# Patient Record
Sex: Male | Born: 1950 | Race: White | Hispanic: No | Smoking: Never smoker
Health system: Southern US, Community
[De-identification: ages and names within clinical notes are randomized; demographics above are authoritative.]

## PROBLEM LIST (undated history)

## (undated) DIAGNOSIS — E785 Hyperlipidemia, unspecified: Secondary | ICD-10-CM

## (undated) DIAGNOSIS — R0600 Dyspnea, unspecified: Secondary | ICD-10-CM

## (undated) DIAGNOSIS — I4891 Unspecified atrial fibrillation: Secondary | ICD-10-CM

## (undated) DIAGNOSIS — I739 Peripheral vascular disease, unspecified: Secondary | ICD-10-CM

## (undated) DIAGNOSIS — I509 Heart failure, unspecified: Secondary | ICD-10-CM

## (undated) DIAGNOSIS — I10 Essential (primary) hypertension: Secondary | ICD-10-CM

## (undated) DIAGNOSIS — E119 Type 2 diabetes mellitus without complications: Secondary | ICD-10-CM

## (undated) DIAGNOSIS — C801 Malignant (primary) neoplasm, unspecified: Secondary | ICD-10-CM

## (undated) HISTORY — PX: COLON RESECTION: SHX5231

## (undated) HISTORY — PX: COLONOSCOPY: SHX174

---

## 2010-11-14 ENCOUNTER — Ambulatory Visit: Payer: Self-pay | Admitting: Gastroenterology

## 2010-11-29 ENCOUNTER — Ambulatory Visit: Payer: Self-pay | Admitting: Internal Medicine

## 2010-12-05 ENCOUNTER — Ambulatory Visit: Payer: Self-pay | Admitting: Surgery

## 2010-12-06 ENCOUNTER — Ambulatory Visit: Payer: Self-pay | Admitting: Internal Medicine

## 2010-12-11 ENCOUNTER — Ambulatory Visit: Payer: Self-pay | Admitting: Internal Medicine

## 2010-12-11 ENCOUNTER — Telehealth: Payer: Self-pay

## 2010-12-11 DIAGNOSIS — C20 Malignant neoplasm of rectum: Secondary | ICD-10-CM

## 2010-12-11 NOTE — Telephone Encounter (Signed)
Pt scheduled for [place] 12/21/10 [place] pt needs instructing meds reviewed

## 2010-12-12 NOTE — Telephone Encounter (Signed)
Pt has been instructed and meds reviewed.  He will call with any questions after receiving the instructions in the mail.

## 2010-12-20 ENCOUNTER — Ambulatory Visit: Payer: Self-pay | Admitting: Surgery

## 2010-12-21 ENCOUNTER — Ambulatory Visit: Payer: Self-pay

## 2010-12-21 ENCOUNTER — Encounter: Payer: Self-pay | Admitting: Gastroenterology

## 2010-12-25 LAB — PATHOLOGY REPORT

## 2010-12-27 ENCOUNTER — Ambulatory Visit: Payer: Self-pay | Admitting: Surgery

## 2010-12-30 ENCOUNTER — Ambulatory Visit: Payer: Self-pay | Admitting: Internal Medicine

## 2011-01-05 ENCOUNTER — Encounter: Payer: Self-pay | Admitting: Gastroenterology

## 2011-01-29 ENCOUNTER — Ambulatory Visit: Payer: Self-pay | Admitting: Internal Medicine

## 2011-03-01 ENCOUNTER — Ambulatory Visit: Payer: Self-pay | Admitting: Internal Medicine

## 2011-03-06 ENCOUNTER — Telehealth: Payer: Self-pay

## 2011-03-06 NOTE — Telephone Encounter (Signed)
Dr Christella Hartigan,  Dr Katrinka Blazing with [facility] would like you to call him regarding the Rectal EUS you did on the pt in August  [phone].

## 2011-03-07 NOTE — Telephone Encounter (Signed)
i called.  He was in surgery.  i left my pager number for him to call back

## 2011-03-07 NOTE — Telephone Encounter (Signed)
We spoke, i answered all his questions.

## 2011-03-16 ENCOUNTER — Ambulatory Visit: Payer: Self-pay | Admitting: Surgery

## 2011-03-26 ENCOUNTER — Inpatient Hospital Stay: Payer: Self-pay | Admitting: Surgery

## 2011-03-28 LAB — PATHOLOGY REPORT

## 2011-04-13 ENCOUNTER — Ambulatory Visit: Payer: Self-pay | Admitting: Internal Medicine

## 2011-04-14 LAB — CEA: CEA: 1.8 ng/mL

## 2011-05-01 ENCOUNTER — Ambulatory Visit: Payer: Self-pay | Admitting: Internal Medicine

## 2011-05-14 LAB — COMPREHENSIVE METABOLIC PANEL
Alkaline Phosphatase: 101 U/L (ref 50–136)
Anion Gap: 8 (ref 7–16)
BUN: 11 mg/dL (ref 7–18)
Bilirubin,Total: 0.4 mg/dL (ref 0.2–1.0)
Calcium, Total: 8.8 mg/dL (ref 8.5–10.1)
Chloride: 104 mmol/L (ref 98–107)
Co2: 26 mmol/L (ref 21–32)
Creatinine: 1.03 mg/dL (ref 0.60–1.30)
EGFR (Non-African Amer.): >60
Osmolality: 282 (ref 275–301)
SGOT(AST): 18 U/L (ref 15–37)
SGPT (ALT): 29 U/L
Sodium: 138 mmol/L (ref 136–145)
Total Protein: 6.9 g/dL (ref 6.4–8.2)

## 2011-05-14 LAB — CBC CANCER CENTER
Basophil #: 0 x10 3/mm (ref 0.0–0.1)
Basophil %: 0.5 %
Eosinophil #: 0.1 x10 3/mm (ref 0.0–0.7)
Eosinophil %: 2.8 %
HCT: 35.1 % — ABNORMAL LOW (ref 40.0–52.0)
HGB: 12.2 g/dL — ABNORMAL LOW (ref 13.0–18.0)
Lymphocyte #: 0.4 x10 3/mm — ABNORMAL LOW (ref 1.0–3.6)
Lymphocyte %: 23.3 %
MCH: 29.4 pg (ref 26.0–34.0)
MCHC: 34.7 g/dL (ref 32.0–36.0)
MCV: 85 fL (ref 80–100)
Monocyte #: 0.3 x10 3/mm (ref 0.0–0.7)
Monocyte %: 17.5 %
Neutrophil #: 1.1 x10 3/mm — ABNORMAL LOW (ref 1.4–6.5)
Neutrophil %: 55.9 %
Platelet: 110 x10 3/mm — ABNORMAL LOW (ref 150–440)
RBC: 4.14 10*6/uL — ABNORMAL LOW (ref 4.40–5.90)
RDW: 13.9 % (ref 11.5–14.5)
WBC: 1.9 x10 3/mm — CL (ref 3.8–10.6)

## 2011-05-21 LAB — CBC CANCER CENTER
Eosinophil #: 0.1 x10 3/mm (ref 0.0–0.7)
Eosinophil %: 1.7 %
HCT: 39.5 % — ABNORMAL LOW (ref 40.0–52.0)
HGB: 13.4 g/dL (ref 13.0–18.0)
Lymphocyte %: 12.5 %
MCV: 85 fL (ref 80–100)
Monocyte #: 0.8 x10 3/mm — ABNORMAL HIGH (ref 0.0–0.7)
Neutrophil %: 71.8 %
Platelet: 261 x10 3/mm (ref 150–440)
RBC: 4.65 10*6/uL (ref 4.40–5.90)
RDW: 14.8 % — ABNORMAL HIGH (ref 11.5–14.5)
WBC: 5.9 x10 3/mm (ref 3.8–10.6)

## 2011-05-28 LAB — CBC CANCER CENTER
Basophil #: 0 x10 3/mm (ref 0.0–0.1)
Lymphocyte #: 0.8 x10 3/mm — ABNORMAL LOW (ref 1.0–3.6)
Lymphocyte %: 9.1 %
MCH: 28.7 pg (ref 26.0–34.0)
MCV: 84 fL (ref 80–100)
Monocyte %: 6.5 %
Neutrophil #: 7.5 x10 3/mm — ABNORMAL HIGH (ref 1.4–6.5)
Neutrophil %: 83.6 %
Platelet: 196 x10 3/mm (ref 150–440)
RBC: 4.85 10*6/uL (ref 4.40–5.90)
RDW: 15.2 % — ABNORMAL HIGH (ref 11.5–14.5)
WBC: 9 x10 3/mm (ref 3.8–10.6)

## 2011-06-01 ENCOUNTER — Ambulatory Visit: Payer: Self-pay | Admitting: Internal Medicine

## 2011-06-04 LAB — CBC CANCER CENTER
Basophil %: 0.4 %
Eosinophil %: 1.2 %
HCT: 37.2 % — ABNORMAL LOW (ref 40.0–52.0)
Lymphocyte %: 9.6 %
MCV: 85 fL (ref 80–100)
Monocyte #: 0.5 x10 3/mm (ref 0.0–0.7)
Monocyte %: 10.3 %
Neutrophil %: 78.5 %
RBC: 4.39 10*6/uL — ABNORMAL LOW (ref 4.40–5.90)
RDW: 16.2 % — ABNORMAL HIGH (ref 11.5–14.5)
WBC: 5.2 x10 3/mm (ref 3.8–10.6)

## 2011-06-04 LAB — HEPATIC FUNCTION PANEL A (ARMC)
Albumin: 3.1 g/dL — ABNORMAL LOW (ref 3.4–5.0)
Alkaline Phosphatase: 96 U/L (ref 50–136)
Bilirubin,Total: 0.4 mg/dL (ref 0.2–1.0)
SGOT(AST): 20 U/L (ref 15–37)
SGPT (ALT): 30 U/L

## 2011-06-04 LAB — BASIC METABOLIC PANEL
Anion Gap: 10 (ref 7–16)
Calcium, Total: 8.6 mg/dL (ref 8.5–10.1)
Chloride: 102 mmol/L (ref 98–107)
Co2: 27 mmol/L (ref 21–32)
Creatinine: 1.05 mg/dL (ref 0.60–1.30)
EGFR (Non-African Amer.): >60
Glucose: 228 mg/dL — ABNORMAL HIGH (ref 65–99)
Osmolality: 284 (ref 275–301)
Potassium: 3.7 mmol/L (ref 3.5–5.1)
Sodium: 139 mmol/L (ref 136–145)

## 2011-06-11 LAB — CBC CANCER CENTER
Eosinophil #: 0.1 x10 3/mm (ref 0.0–0.7)
Eosinophil %: 1.6 %
HCT: 38.4 % — ABNORMAL LOW (ref 40.0–52.0)
Lymphocyte #: 0.8 x10 3/mm — ABNORMAL LOW (ref 1.0–3.6)
MCH: 28.6 pg (ref 26.0–34.0)
MCHC: 34.3 g/dL (ref 32.0–36.0)
Monocyte #: 0.4 x10 3/mm (ref 0.0–0.7)
Monocyte %: 9.3 %
Platelet: 170 x10 3/mm (ref 150–440)

## 2011-06-18 LAB — CBC CANCER CENTER
Basophil %: 0.5 %
Eosinophil #: 0 x10 3/mm (ref 0.0–0.7)
Eosinophil %: 1 %
HCT: 37.8 % — ABNORMAL LOW (ref 40.0–52.0)
HGB: 12.9 g/dL — ABNORMAL LOW (ref 13.0–18.0)
Lymphocyte #: 0.6 x10 3/mm — ABNORMAL LOW (ref 1.0–3.6)
MCH: 28.6 pg (ref 26.0–34.0)
MCV: 84 fL (ref 80–100)
Monocyte #: 0.5 x10 3/mm (ref 0.0–0.7)
Monocyte %: 13.1 %
Neutrophil #: 3 x10 3/mm (ref 1.4–6.5)
Neutrophil %: 71.5 %
RBC: 4.49 10*6/uL (ref 4.40–5.90)
WBC: 4.2 x10 3/mm (ref 3.8–10.6)

## 2011-06-18 LAB — BASIC METABOLIC PANEL
Calcium, Total: 8.7 mg/dL (ref 8.5–10.1)
Chloride: 104 mmol/L (ref 98–107)
Co2: 28 mmol/L (ref 21–32)
Creatinine: 0.9 mg/dL (ref 0.60–1.30)
EGFR (Non-African Amer.): >60
Glucose: 203 mg/dL — ABNORMAL HIGH (ref 65–99)
Osmolality: 285 (ref 275–301)
Potassium: 3.7 mmol/L (ref 3.5–5.1)

## 2011-06-18 LAB — HEPATIC FUNCTION PANEL A (ARMC)
Bilirubin,Total: 0.6 mg/dL (ref 0.2–1.0)
SGOT(AST): 20 U/L (ref 15–37)
Total Protein: 6.7 g/dL (ref 6.4–8.2)

## 2011-06-25 LAB — CBC CANCER CENTER
Eosinophil %: 0.8 %
HGB: 13.5 g/dL (ref 13.0–18.0)
Lymphocyte %: 14.7 %
MCH: 28.5 pg (ref 26.0–34.0)
Monocyte #: 0.5 x10 3/mm (ref 0.0–0.7)
Monocyte %: 13 %
Neutrophil %: 71.3 %
Platelet: 163 x10 3/mm (ref 150–440)
RBC: 4.74 10*6/uL (ref 4.40–5.90)
RDW: 17.3 % — ABNORMAL HIGH (ref 11.5–14.5)
WBC: 4.2 x10 3/mm (ref 3.8–10.6)

## 2011-06-29 ENCOUNTER — Ambulatory Visit: Payer: Self-pay | Admitting: Internal Medicine

## 2011-07-02 LAB — CBC CANCER CENTER
Basophil %: 0.4 %
Eosinophil #: 0 x10 3/mm (ref 0.0–0.7)
Eosinophil %: 0.9 %
HCT: 37.7 % — ABNORMAL LOW (ref 40.0–52.0)
HGB: 12.9 g/dL — ABNORMAL LOW (ref 13.0–18.0)
Lymphocyte %: 13.7 %
MCH: 29.2 pg (ref 26.0–34.0)
Monocyte #: 0.4 x10 3/mm (ref 0.0–0.7)
Monocyte %: 12.8 %
Neutrophil %: 72.2 %
Platelet: 100 x10 3/mm — ABNORMAL LOW (ref 150–440)
RBC: 4.43 10*6/uL (ref 4.40–5.90)
WBC: 3.5 x10 3/mm — ABNORMAL LOW (ref 3.8–10.6)

## 2011-07-02 LAB — HEPATIC FUNCTION PANEL A (ARMC)
Alkaline Phosphatase: 81 U/L (ref 50–136)
Bilirubin, Direct: 0.1 mg/dL (ref 0.00–0.20)
SGOT(AST): 18 U/L (ref 15–37)

## 2011-07-02 LAB — BASIC METABOLIC PANEL
Anion Gap: 11 (ref 7–16)
BUN: 13 mg/dL (ref 7–18)
Calcium, Total: 9.2 mg/dL (ref 8.5–10.1)
Co2: 27 mmol/L (ref 21–32)
Creatinine: 0.96 mg/dL (ref 0.60–1.30)
EGFR (African American): >60
EGFR (Non-African Amer.): >60
Sodium: 139 mmol/L (ref 136–145)

## 2011-07-09 LAB — CBC CANCER CENTER
Basophil #: 0 x10 3/mm (ref 0.0–0.1)
Eosinophil #: 0 x10 3/mm (ref 0.0–0.7)
Eosinophil %: 0.8 %
HCT: 38.8 % — ABNORMAL LOW (ref 40.0–52.0)
Lymphocyte #: 0.6 x10 3/mm — ABNORMAL LOW (ref 1.0–3.6)
Monocyte %: 11.3 %
Neutrophil #: 2.4 x10 3/mm (ref 1.4–6.5)
Neutrophil %: 70.4 %
Platelet: 144 x10 3/mm — ABNORMAL LOW (ref 150–440)
RDW: 18.5 % — ABNORMAL HIGH (ref 11.5–14.5)

## 2011-07-16 LAB — HEPATIC FUNCTION PANEL A (ARMC)
Albumin: 3.2 g/dL — ABNORMAL LOW (ref 3.4–5.0)
Alkaline Phosphatase: 108 U/L (ref 50–136)
Bilirubin, Direct: 0.1 mg/dL (ref 0.00–0.20)
Bilirubin,Total: 0.7 mg/dL (ref 0.2–1.0)
SGOT(AST): 33 U/L (ref 15–37)

## 2011-07-16 LAB — CBC CANCER CENTER
Basophil #: 0 x10 3/mm (ref 0.0–0.1)
Basophil %: 0.3 %
HCT: 37.8 % — ABNORMAL LOW (ref 40.0–52.0)
Lymphocyte #: 0.5 x10 3/mm — ABNORMAL LOW (ref 1.0–3.6)
Lymphocyte %: 14.7 %
MCH: 29.5 pg (ref 26.0–34.0)
MCV: 85 fL (ref 80–100)
Monocyte #: 0.6 x10 3/mm (ref 0.0–0.7)
Neutrophil #: 2.3 x10 3/mm (ref 1.4–6.5)
Platelet: 100 x10 3/mm — ABNORMAL LOW (ref 150–440)
RDW: 19.9 % — ABNORMAL HIGH (ref 11.5–14.5)

## 2011-07-16 LAB — BASIC METABOLIC PANEL
BUN: 12 mg/dL (ref 7–18)
Calcium, Total: 8.9 mg/dL (ref 8.5–10.1)
EGFR (African American): >60
EGFR (Non-African Amer.): >60
Glucose: 139 mg/dL — ABNORMAL HIGH (ref 65–99)
Osmolality: 281 (ref 275–301)
Potassium: 3.5 mmol/L (ref 3.5–5.1)
Sodium: 140 mmol/L (ref 136–145)

## 2011-07-23 LAB — CBC CANCER CENTER
Basophil %: 0.3 %
Eosinophil #: 0 x10 3/mm (ref 0.0–0.7)
HGB: 14.2 g/dL (ref 13.0–18.0)
MCH: 29.3 pg (ref 26.0–34.0)
MCHC: 34.3 g/dL (ref 32.0–36.0)
MCV: 86 fL (ref 80–100)
Monocyte #: 0.4 x10 3/mm (ref 0.0–0.7)
Monocyte %: 13.8 %
Neutrophil #: 1.9 x10 3/mm (ref 1.4–6.5)
Neutrophil %: 63.2 %
RBC: 4.83 10*6/uL (ref 4.40–5.90)
RDW: 19.8 % — ABNORMAL HIGH (ref 11.5–14.5)
WBC: 3 x10 3/mm — ABNORMAL LOW (ref 3.8–10.6)

## 2011-07-30 ENCOUNTER — Ambulatory Visit: Payer: Self-pay | Admitting: Internal Medicine

## 2011-07-30 LAB — CBC CANCER CENTER
Basophil #: 0 x10 3/mm (ref 0.0–0.1)
Basophil %: 0.6 %
Eosinophil #: 0 x10 3/mm (ref 0.0–0.7)
HCT: 36.4 % — ABNORMAL LOW (ref 40.0–52.0)
Lymphocyte #: 0.5 x10 3/mm — ABNORMAL LOW (ref 1.0–3.6)
Lymphocyte %: 15.8 %
MCH: 29.9 pg (ref 26.0–34.0)
MCHC: 33.8 g/dL (ref 32.0–36.0)
Monocyte %: 19.4 %
Platelet: 93 x10 3/mm — ABNORMAL LOW (ref 150–440)
WBC: 2.9 x10 3/mm — ABNORMAL LOW (ref 3.8–10.6)

## 2011-07-30 LAB — BASIC METABOLIC PANEL
Anion Gap: 10 (ref 7–16)
Calcium, Total: 8.7 mg/dL (ref 8.5–10.1)
Chloride: 108 mmol/L — ABNORMAL HIGH (ref 98–107)
Creatinine: 0.79 mg/dL (ref 0.60–1.30)
EGFR (Non-African Amer.): >60
Potassium: 3.6 mmol/L (ref 3.5–5.1)
Sodium: 144 mmol/L (ref 136–145)

## 2011-07-30 LAB — HEPATIC FUNCTION PANEL A (ARMC)
Alkaline Phosphatase: 110 U/L (ref 50–136)
Bilirubin, Direct: <0.1 mg/dL (ref 0.00–0.20)
SGOT(AST): 37 U/L (ref 15–37)

## 2011-08-06 LAB — CBC CANCER CENTER
Basophil %: 0.5 %
HGB: 13.3 g/dL (ref 13.0–18.0)
Lymphocyte %: 19.3 %
MCH: 30.4 pg (ref 26.0–34.0)
MCHC: 34.8 g/dL (ref 32.0–36.0)
Monocyte %: 11.5 %
Neutrophil #: 1.9 x10 3/mm (ref 1.4–6.5)
Neutrophil %: 68.1 %
Platelet: 129 x10 3/mm — ABNORMAL LOW (ref 150–440)
WBC: 2.9 x10 3/mm — ABNORMAL LOW (ref 3.8–10.6)

## 2011-08-13 LAB — BASIC METABOLIC PANEL
Calcium, Total: 8.7 mg/dL (ref 8.5–10.1)
Chloride: 102 mmol/L (ref 98–107)
Creatinine: 0.9 mg/dL (ref 0.60–1.30)
EGFR (African American): >60
EGFR (Non-African Amer.): >60
Glucose: 170 mg/dL — ABNORMAL HIGH (ref 65–99)
Osmolality: 282 (ref 275–301)
Sodium: 139 mmol/L (ref 136–145)

## 2011-08-13 LAB — HEPATIC FUNCTION PANEL A (ARMC)
Alkaline Phosphatase: 120 U/L (ref 50–136)
Bilirubin,Total: 0.8 mg/dL (ref 0.2–1.0)
SGOT(AST): 56 U/L — ABNORMAL HIGH (ref 15–37)
Total Protein: 7 g/dL (ref 6.4–8.2)

## 2011-08-13 LAB — CBC CANCER CENTER
Basophil %: 0.7 %
Eosinophil %: 1.6 %
HCT: 38.8 % — ABNORMAL LOW (ref 40.0–52.0)
HGB: 13.1 g/dL (ref 13.0–18.0)
Lymphocyte #: 0.4 x10 3/mm — ABNORMAL LOW (ref 1.0–3.6)
Lymphocyte %: 15.6 %
MCH: 29.9 pg (ref 26.0–34.0)
MCHC: 33.8 g/dL (ref 32.0–36.0)
MCV: 89 fL (ref 80–100)
Monocyte #: 0.7 x10 3/mm (ref 0.2–1.0)
Monocyte %: 26.2 %
Neutrophil #: 1.5 x10 3/mm (ref 1.4–6.5)
Neutrophil %: 55.9 %
RBC: 4.38 10*6/uL — ABNORMAL LOW (ref 4.40–5.90)
WBC: 2.8 x10 3/mm — ABNORMAL LOW (ref 3.8–10.6)

## 2011-08-20 LAB — CBC CANCER CENTER
Basophil %: 0.6 %
Eosinophil #: 0 x10 3/mm (ref 0.0–0.7)
Eosinophil %: 0.9 %
Lymphocyte #: 0.5 x10 3/mm — ABNORMAL LOW (ref 1.0–3.6)
Lymphocyte %: 19.1 %
MCH: 30 pg (ref 26.0–34.0)
MCHC: 33.8 g/dL (ref 32.0–36.0)
Neutrophil %: 67 %
Platelet: 146 x10 3/mm — ABNORMAL LOW (ref 150–440)
RBC: 4.64 10*6/uL (ref 4.40–5.90)
RDW: 18.9 % — ABNORMAL HIGH (ref 11.5–14.5)
WBC: 2.7 x10 3/mm — ABNORMAL LOW (ref 3.8–10.6)

## 2011-08-27 LAB — CBC CANCER CENTER
Basophil #: 0 x10 3/mm (ref 0.0–0.1)
Eosinophil #: 0 x10 3/mm (ref 0.0–0.7)
HCT: 38.5 % — ABNORMAL LOW (ref 40.0–52.0)
Lymphocyte #: 0.4 x10 3/mm — ABNORMAL LOW (ref 1.0–3.6)
Lymphocyte %: 16.9 %
MCHC: 33.4 g/dL (ref 32.0–36.0)
MCV: 90 fL (ref 80–100)
Neutrophil #: 1.6 x10 3/mm (ref 1.4–6.5)
RBC: 4.26 10*6/uL — ABNORMAL LOW (ref 4.40–5.90)
RDW: 19 % — ABNORMAL HIGH (ref 11.5–14.5)

## 2011-08-27 LAB — HEPATIC FUNCTION PANEL A (ARMC)
Albumin: 3.2 g/dL — ABNORMAL LOW (ref 3.4–5.0)
Bilirubin, Direct: 0.2 mg/dL (ref 0.00–0.20)
Bilirubin,Total: 0.7 mg/dL (ref 0.2–1.0)
SGOT(AST): 36 U/L (ref 15–37)
SGPT (ALT): 38 U/L

## 2011-08-27 LAB — BASIC METABOLIC PANEL
Anion Gap: 8 (ref 7–16)
BUN: 13 mg/dL (ref 7–18)
Calcium, Total: 8.8 mg/dL (ref 8.5–10.1)
Chloride: 104 mmol/L (ref 98–107)
Co2: 29 mmol/L (ref 21–32)
EGFR (African American): >60
Glucose: 180 mg/dL — ABNORMAL HIGH (ref 65–99)
Osmolality: 286 (ref 275–301)
Potassium: 3.5 mmol/L (ref 3.5–5.1)

## 2011-08-29 ENCOUNTER — Ambulatory Visit: Payer: Self-pay | Admitting: Internal Medicine

## 2011-09-03 LAB — CBC CANCER CENTER
Basophil #: 0 x10 3/mm (ref 0.0–0.1)
Basophil %: 0.6 %
Eosinophil %: 2 %
HGB: 13.5 g/dL (ref 13.0–18.0)
Lymphocyte #: 0.6 x10 3/mm — ABNORMAL LOW (ref 1.0–3.6)
Lymphocyte %: 20 %
MCHC: 33.6 g/dL (ref 32.0–36.0)
MCV: 91 fL (ref 80–100)
Monocyte %: 26.1 %
Neutrophil #: 1.5 x10 3/mm (ref 1.4–6.5)
Platelet: 143 x10 3/mm — ABNORMAL LOW (ref 150–440)
RBC: 4.42 10*6/uL (ref 4.40–5.90)

## 2011-09-10 LAB — CBC CANCER CENTER
Basophil %: 0.4 %
Eosinophil #: 0.1 x10 3/mm (ref 0.0–0.7)
Eosinophil %: 1.2 %
HCT: 38.9 % — ABNORMAL LOW (ref 40.0–52.0)
HGB: 13.1 g/dL (ref 13.0–18.0)
MCH: 30.7 pg (ref 26.0–34.0)
MCV: 91 fL (ref 80–100)
Monocyte #: 0.4 x10 3/mm (ref 0.2–1.0)
Neutrophil #: 3.2 x10 3/mm (ref 1.4–6.5)
Platelet: 109 x10 3/mm — ABNORMAL LOW (ref 150–440)
RDW: 17.3 % — ABNORMAL HIGH (ref 11.5–14.5)
WBC: 4.3 x10 3/mm (ref 3.8–10.6)

## 2011-09-17 LAB — HEPATIC FUNCTION PANEL A (ARMC)
Albumin: 3.1 g/dL — ABNORMAL LOW (ref 3.4–5.0)
Bilirubin, Direct: 0.2 mg/dL (ref 0.00–0.20)
Bilirubin,Total: 0.8 mg/dL (ref 0.2–1.0)
SGOT(AST): 26 U/L (ref 15–37)
SGPT (ALT): 31 U/L

## 2011-09-17 LAB — BASIC METABOLIC PANEL
Anion Gap: 10 (ref 7–16)
BUN: 14 mg/dL (ref 7–18)
Calcium, Total: 8.8 mg/dL (ref 8.5–10.1)
Chloride: 106 mmol/L (ref 98–107)
Creatinine: 0.83 mg/dL (ref 0.60–1.30)
EGFR (African American): >60
EGFR (Non-African Amer.): >60
Osmolality: 291 (ref 275–301)
Potassium: 3.3 mmol/L — ABNORMAL LOW (ref 3.5–5.1)
Sodium: 143 mmol/L (ref 136–145)

## 2011-09-17 LAB — CBC CANCER CENTER
Basophil #: 0 x10 3/mm (ref 0.0–0.1)
Basophil %: 0.5 %
Eosinophil #: 0 x10 3/mm (ref 0.0–0.7)
Lymphocyte #: 0.4 x10 3/mm — ABNORMAL LOW (ref 1.0–3.6)
Lymphocyte %: 11.6 %
MCH: 31 pg (ref 26.0–34.0)
MCHC: 33.5 g/dL (ref 32.0–36.0)
MCV: 93 fL (ref 80–100)
Monocyte #: 0.5 x10 3/mm (ref 0.2–1.0)
Monocyte %: 14.2 %
Neutrophil %: 72.7 %
Platelet: 91 x10 3/mm — ABNORMAL LOW (ref 150–440)
RBC: 4.09 10*6/uL — ABNORMAL LOW (ref 4.40–5.90)
RDW: 17.6 % — ABNORMAL HIGH (ref 11.5–14.5)

## 2011-09-29 ENCOUNTER — Ambulatory Visit: Payer: Self-pay | Admitting: Internal Medicine

## 2011-10-01 LAB — BASIC METABOLIC PANEL
Calcium, Total: 8.7 mg/dL (ref 8.5–10.1)
Chloride: 105 mmol/L (ref 98–107)
Creatinine: 0.87 mg/dL (ref 0.60–1.30)
EGFR (Non-African Amer.): >60
Osmolality: 289 (ref 275–301)
Potassium: 3 mmol/L — ABNORMAL LOW (ref 3.5–5.1)
Sodium: 142 mmol/L (ref 136–145)

## 2011-10-01 LAB — CBC CANCER CENTER
Basophil #: 0 x10 3/mm (ref 0.0–0.1)
Basophil %: 0.5 %
HCT: 37.8 % — ABNORMAL LOW (ref 40.0–52.0)
MCH: 31.4 pg (ref 26.0–34.0)
MCV: 92 fL (ref 80–100)
Monocyte #: 0.5 x10 3/mm (ref 0.2–1.0)
Neutrophil #: 2.5 x10 3/mm (ref 1.4–6.5)
Platelet: 80 x10 3/mm — ABNORMAL LOW (ref 150–440)
RBC: 4.11 10*6/uL — ABNORMAL LOW (ref 4.40–5.90)
RDW: 16.9 % — ABNORMAL HIGH (ref 11.5–14.5)
WBC: 3.4 x10 3/mm — ABNORMAL LOW (ref 3.8–10.6)

## 2011-10-01 LAB — HEPATIC FUNCTION PANEL A (ARMC)
Bilirubin, Direct: 0.2 mg/dL (ref 0.00–0.20)
SGOT(AST): 25 U/L (ref 15–37)
SGPT (ALT): 26 U/L
Total Protein: 6.5 g/dL (ref 6.4–8.2)

## 2011-10-08 LAB — CBC CANCER CENTER
Basophil #: 0 x10 3/mm (ref 0.0–0.1)
Basophil %: 0.9 %
Eosinophil #: 0 x10 3/mm (ref 0.0–0.7)
HCT: 38.9 % — ABNORMAL LOW (ref 40.0–52.0)
HGB: 13.2 g/dL (ref 13.0–18.0)
Lymphocyte #: 0.5 x10 3/mm — ABNORMAL LOW (ref 1.0–3.6)
Lymphocyte %: 16.4 %
MCHC: 34 g/dL (ref 32.0–36.0)
Monocyte %: 18.2 %
Neutrophil #: 1.8 x10 3/mm (ref 1.4–6.5)
RBC: 4.12 10*6/uL — ABNORMAL LOW (ref 4.40–5.90)
WBC: 2.8 x10 3/mm — ABNORMAL LOW (ref 3.8–10.6)

## 2011-10-15 LAB — CBC CANCER CENTER
Basophil #: 0 x10 3/mm (ref 0.0–0.1)
Basophil %: 0.5 %
Eosinophil #: 0.1 x10 3/mm (ref 0.0–0.7)
Lymphocyte #: 0.7 x10 3/mm — ABNORMAL LOW (ref 1.0–3.6)
Lymphocyte %: 15.3 %
MCH: 31.8 pg (ref 26.0–34.0)
MCHC: 33.7 g/dL (ref 32.0–36.0)
MCV: 94 fL (ref 80–100)
Monocyte #: 0.5 x10 3/mm (ref 0.2–1.0)
Monocyte %: 11.6 %

## 2011-10-29 ENCOUNTER — Ambulatory Visit: Payer: Self-pay | Admitting: Internal Medicine

## 2011-11-29 ENCOUNTER — Ambulatory Visit: Payer: Self-pay | Admitting: Internal Medicine

## 2011-12-30 ENCOUNTER — Ambulatory Visit: Payer: Self-pay | Admitting: Internal Medicine

## 2012-01-11 LAB — HEPATIC FUNCTION PANEL A (ARMC)
Albumin: 3.5 g/dL (ref 3.4–5.0)
Alkaline Phosphatase: 107 U/L (ref 50–136)
Bilirubin, Direct: 0.1 mg/dL (ref 0.00–0.20)
Bilirubin,Total: 0.4 mg/dL (ref 0.2–1.0)
Total Protein: 7.3 g/dL (ref 6.4–8.2)

## 2012-01-11 LAB — CBC CANCER CENTER
Basophil #: 0 x10 3/mm (ref 0.0–0.1)
Eosinophil #: 0.1 x10 3/mm (ref 0.0–0.7)
Lymphocyte #: 0.9 x10 3/mm — ABNORMAL LOW (ref 1.0–3.6)
MCHC: 33.7 g/dL (ref 32.0–36.0)
MCV: 91 fL (ref 80–100)
Monocyte #: 0.6 x10 3/mm (ref 0.2–1.0)
Monocyte %: 9.6 %
Neutrophil #: 4.2 x10 3/mm (ref 1.4–6.5)
Neutrophil %: 72.6 %
Platelet: 131 x10 3/mm — ABNORMAL LOW (ref 150–440)
RBC: 4.59 10*6/uL (ref 4.40–5.90)
RDW: 12.6 % (ref 11.5–14.5)
WBC: 5.8 x10 3/mm (ref 3.8–10.6)

## 2012-01-11 LAB — BASIC METABOLIC PANEL
Anion Gap: 9 (ref 7–16)
BUN: 15 mg/dL (ref 7–18)
Chloride: 105 mmol/L (ref 98–107)
Co2: 26 mmol/L (ref 21–32)
Creatinine: 0.95 mg/dL (ref 0.60–1.30)
EGFR (African American): >60
EGFR (Non-African Amer.): >60
Sodium: 140 mmol/L (ref 136–145)

## 2012-01-12 LAB — CEA: CEA: 3.2 ng/mL (ref 0.0–4.7)

## 2012-01-29 ENCOUNTER — Ambulatory Visit: Payer: Self-pay

## 2012-01-29 ENCOUNTER — Ambulatory Visit: Payer: Self-pay | Admitting: Internal Medicine

## 2012-01-31 ENCOUNTER — Ambulatory Visit: Payer: Self-pay | Admitting: Internal Medicine

## 2012-02-29 ENCOUNTER — Ambulatory Visit: Payer: Self-pay | Admitting: Internal Medicine

## 2012-04-04 ENCOUNTER — Ambulatory Visit: Payer: Self-pay | Admitting: Internal Medicine

## 2012-04-04 LAB — CBC CANCER CENTER
Basophil #: 0 x10 3/mm (ref 0.0–0.1)
Basophil %: 0.8 %
Eosinophil #: 0.1 x10 3/mm (ref 0.0–0.7)
Eosinophil %: 1.6 %
HCT: 42.3 % (ref 40.0–52.0)
HGB: 15 g/dL (ref 13.0–18.0)
MCH: 31 pg (ref 26.0–34.0)
MCV: 88 fL (ref 80–100)
Monocyte #: 0.6 x10 3/mm (ref 0.2–1.0)
Monocyte %: 9.5 %
Neutrophil #: 4.4 x10 3/mm (ref 1.4–6.5)
Neutrophil %: 71 %
Platelet: 153 x10 3/mm (ref 150–440)
RBC: 4.82 10*6/uL (ref 4.40–5.90)
WBC: 6.2 x10 3/mm (ref 3.8–10.6)

## 2012-04-04 LAB — HEPATIC FUNCTION PANEL A (ARMC)
Albumin: 3.4 g/dL (ref 3.4–5.0)
Bilirubin, Direct: 0.1 mg/dL (ref 0.00–0.20)

## 2012-04-05 LAB — CEA: CEA: 2.4 ng/mL (ref 0.0–4.7)

## 2012-04-15 ENCOUNTER — Ambulatory Visit: Payer: Self-pay | Admitting: Gastroenterology

## 2012-04-30 ENCOUNTER — Ambulatory Visit: Payer: Self-pay | Admitting: Internal Medicine

## 2012-06-27 ENCOUNTER — Ambulatory Visit: Payer: Self-pay | Admitting: Internal Medicine

## 2012-06-28 ENCOUNTER — Ambulatory Visit: Payer: Self-pay | Admitting: Internal Medicine

## 2012-09-15 ENCOUNTER — Ambulatory Visit: Payer: Self-pay | Admitting: Internal Medicine

## 2012-09-15 LAB — CBC CANCER CENTER
Basophil %: 0.8 %
Eosinophil #: 0.1 x10 3/mm (ref 0.0–0.7)
Eosinophil %: 1.2 %
HCT: 45.4 % (ref 40.0–52.0)
HGB: 15.8 g/dL (ref 13.0–18.0)
Lymphocyte #: 1.2 x10 3/mm (ref 1.0–3.6)
Lymphocyte %: 16.3 %
MCH: 30.3 pg (ref 26.0–34.0)
MCHC: 34.7 g/dL (ref 32.0–36.0)
Monocyte #: 0.5 x10 3/mm (ref 0.2–1.0)
Neutrophil %: 74.9 %
WBC: 7.2 x10 3/mm (ref 3.8–10.6)

## 2012-09-28 ENCOUNTER — Ambulatory Visit: Payer: Self-pay | Admitting: Internal Medicine

## 2012-11-07 ENCOUNTER — Ambulatory Visit: Payer: Self-pay | Admitting: Internal Medicine

## 2012-11-07 LAB — CBC CANCER CENTER
Basophil %: 0.7 %
Eosinophil %: 0.8 %
HCT: 47.3 % (ref 40.0–52.0)
HGB: 16.7 g/dL (ref 13.0–18.0)
Lymphocyte #: 1.5 x10 3/mm (ref 1.0–3.6)
MCH: 31 pg (ref 26.0–34.0)
MCV: 88 fL (ref 80–100)
Monocyte #: 0.8 x10 3/mm (ref 0.2–1.0)
Monocyte %: 8.1 %
RDW: 13.8 % (ref 11.5–14.5)

## 2012-11-07 LAB — HEPATIC FUNCTION PANEL A (ARMC)
Alkaline Phosphatase: 108 U/L (ref 50–136)
Bilirubin, Direct: 0.1 mg/dL (ref 0.00–0.20)
SGOT(AST): 24 U/L (ref 15–37)

## 2012-11-07 LAB — CREATININE, SERUM
Creatinine: 1.17 mg/dL (ref 0.60–1.30)
EGFR (African American): >60
EGFR (Non-African Amer.): >60

## 2012-11-08 LAB — CEA: CEA: 3.1 ng/mL (ref 0.0–4.7)

## 2012-11-28 ENCOUNTER — Ambulatory Visit: Payer: Self-pay | Admitting: Internal Medicine

## 2012-12-16 ENCOUNTER — Inpatient Hospital Stay: Payer: Self-pay | Admitting: Family Medicine

## 2012-12-16 LAB — URINALYSIS, COMPLETE
Bilirubin,UR: NEGATIVE
Blood: NEGATIVE
Glucose,UR: >=500 mg/dL (ref 0–75)
Leukocyte Esterase: NEGATIVE
Nitrite: NEGATIVE
Ph: 5 (ref 4.5–8.0)
Protein: NEGATIVE
WBC UR: 1 /HPF (ref 0–5)

## 2012-12-16 LAB — CBC
MCH: 30.4 pg (ref 26.0–34.0)
MCHC: 34.9 g/dL (ref 32.0–36.0)
Platelet: 177 10*3/uL (ref 150–440)
RDW: 13.2 % (ref 11.5–14.5)

## 2012-12-16 LAB — COMPREHENSIVE METABOLIC PANEL
Albumin: 3.6 g/dL (ref 3.4–5.0)
Alkaline Phosphatase: 94 U/L (ref 50–136)
Anion Gap: 9 (ref 7–16)
BUN: 15 mg/dL (ref 7–18)
Bilirubin,Total: 0.6 mg/dL (ref 0.2–1.0)
Chloride: 102 mmol/L (ref 98–107)
Creatinine: 1.32 mg/dL — ABNORMAL HIGH (ref 0.60–1.30)
EGFR (African American): >60
EGFR (Non-African Amer.): 58 — ABNORMAL LOW
Potassium: 3.8 mmol/L (ref 3.5–5.1)
SGPT (ALT): 30 U/L (ref 12–78)
Sodium: 134 mmol/L — ABNORMAL LOW (ref 136–145)
Total Protein: 7 g/dL (ref 6.4–8.2)

## 2012-12-16 LAB — TROPONIN I
Troponin-I: 0.06 ng/mL — ABNORMAL HIGH
Troponin-I: 0.05 ng/mL

## 2012-12-16 LAB — CK TOTAL AND CKMB (NOT AT ARMC)
CK, Total: 66 U/L (ref 35–232)
CK, Total: 55 U/L (ref 35–232)
CK-MB: 1.7 ng/mL (ref 0.5–3.6)
CK-MB: 1.5 ng/mL (ref 0.5–3.6)

## 2012-12-17 LAB — COMPREHENSIVE METABOLIC PANEL
Alkaline Phosphatase: 78 U/L (ref 50–136)
Bilirubin,Total: 0.7 mg/dL (ref 0.2–1.0)
Calcium, Total: 8.2 mg/dL — ABNORMAL LOW (ref 8.5–10.1)
EGFR (African American): >60
EGFR (Non-African Amer.): >60
Glucose: 217 mg/dL — ABNORMAL HIGH (ref 65–99)
Potassium: 3.8 mmol/L (ref 3.5–5.1)
SGOT(AST): 16 U/L (ref 15–37)
SGPT (ALT): 23 U/L (ref 12–78)
Total Protein: 5.8 g/dL — ABNORMAL LOW (ref 6.4–8.2)

## 2012-12-17 LAB — CBC WITH DIFFERENTIAL/PLATELET
Eosinophil #: 0.1 10*3/uL (ref 0.0–0.7)
Eosinophil %: 1.1 %
HCT: 38.8 % — ABNORMAL LOW (ref 40.0–52.0)
HGB: 13.9 g/dL (ref 13.0–18.0)
Lymphocyte %: 13.2 %
MCH: 31 pg (ref 26.0–34.0)
MCHC: 35.7 g/dL (ref 32.0–36.0)
Neutrophil %: 76.5 %
Platelet: 147 10*3/uL — ABNORMAL LOW (ref 150–440)
RBC: 4.47 10*6/uL (ref 4.40–5.90)
RDW: 13.3 % (ref 11.5–14.5)

## 2012-12-17 LAB — CK TOTAL AND CKMB (NOT AT ARMC)
CK, Total: 49 U/L (ref 35–232)
CK-MB: 1.1 ng/mL (ref 0.5–3.6)

## 2012-12-29 ENCOUNTER — Ambulatory Visit: Payer: Self-pay | Admitting: Internal Medicine

## 2012-12-29 ENCOUNTER — Ambulatory Visit: Payer: Self-pay | Admitting: Family Medicine

## 2013-04-14 ENCOUNTER — Ambulatory Visit: Payer: Self-pay | Admitting: Internal Medicine

## 2013-04-30 ENCOUNTER — Ambulatory Visit: Payer: Self-pay | Admitting: Internal Medicine

## 2014-08-04 ENCOUNTER — Ambulatory Visit
Admit: 2014-08-04 | Disposition: A | Discharge status: Home / Self Care | Payer: Self-pay | Attending: Gastroenterology | Admitting: Gastroenterology

## 2014-08-05 LAB — CEA: CEA: 2.2 ng/mL (ref 0.0–4.7)

## 2014-08-09 ENCOUNTER — Ambulatory Visit
Admit: 2014-08-09 | Disposition: A | Discharge status: Home / Self Care | Payer: Self-pay | Attending: Internal Medicine | Admitting: Internal Medicine

## 2014-08-20 NOTE — Discharge Summary (Signed)
PATIENT NAME:  Donald Townsend, Donald Townsend MR#:  [id number] DATE OF BIRTH:  [birth year]  DATE OF ADMISSION:  12/16/2012 DATE OF DISCHARGE:  12/17/2012  DISCHARGE DIAGNOSES: 1.  Presyncopal episode.  2.  History of hypertension.  3.  History of borderline diabetes.  4.  History of adenocarcinoma.   DISCHARGE MEDICATIONS:  Losartan 50 mg p.o. daily.   CONSULTS: None.   PROCEDURES: None.   PERTINENT LABORATORY AND STUDIES: CT of the head was negative. On day of discharge glucose 217, creatinine 0.84, BUN 15, sodium 139, potassium 3.8. CK-MB all within normal limits. Troponin 0.06, 0.05, 0.06. White blood cell count 6.2, hemoglobin 13.9, and platelets 147, carotid Doppler studies are pending.   BRIEF HOSPITAL COURSE: By problems: 1.  Presyncopal episode: The patient was admitted after a presyncopal episode likely secondary to low glucose levels and mild dehydration. He states that after getting something to eat and hydrating himself here in the hospital, he feels much better. No further issues. No associated chest pain with it.  He did have a negative CT scan, electrolytes and creatinine did improve during his hospital stay. His carotid Doppler are pending at this time. He may be discharged after that is completed.   DISPOSITION: He is in stable condition and will be discharged to home.   FOLLOWUP: Follow up with Dr. Netty Starring within 2 weeks for discharge.    ____________________________ Dion Body, MD kl:dp D: 12/17/2012 08:21:01 ET T: 12/17/2012 08:47:46 ET JOB#: [id number]  cc: Dion Body, MD, <Dictator> Dion Body MD ELECTRONICALLY SIGNED 01/19/2013 10:47

## 2014-08-20 NOTE — H&P (Signed)
PATIENT NAME:  ZELIG, GACEK MR#:  [id number] DATE OF BIRTH:  Jun 18, 1950  DATE OF ADMISSION:  12/16/2012  CHIEF COMPLAINT:  Syncope.   HISTORY OF PRESENT ILLNESS: Deanthony Maull is a 64 year old male with a history of adenocarcinoma of the rectum, status post resection and colostomy, history of type 2 diabetes, presented to the ER, brought by family after he passed out earlier today at work. 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 He did not fall, and he was actually held by coworkers. The patient states that he was sweaty and felt weak. Denied any chest pain. No fevers, no chills, no vomiting. Denies any pain in his left arm. No tongue biting or urinary incontinence. The patient states that he felt better after a few minutes, and drank 3 or 4 glasses of iced tea, and on arrival patient's blood sugar was elevated at 426.   PAST MEDICAL HISTORY:  Significant for type 2 diabetes, currently not on medications, history of hypertension, obesity, stage III adenocarcinoma of the rectum, resected in 2012 and followed by Dr. Tamala Julian. The patient has undergone radiation therapy for this. He also has a history of urinary incontinence.   PAST SURGICAL HISTORY:  Significant for colon cancer surgery.   MEDICATIONS PRESCRIBED INCLUDE: Hydrochlorothiazide 12.5 mg a day and VESIcare 1 tab once a day, but patient has not been compliant with any of these medications.   FAMILY HISTORY:  Mother had a history of diabetes, deceased at age 71. The patient has 6 brothers and 5 sisters, and several of them have heart disease The patient is divorced. He states that he used to smoke several years back, but has not used tobacco for over 35 years. Denies any drug use. Denies any alcohol use.  REVIEW OF SYSTEMS:  No chest pain. No fevers, no chills. No vomiting, no diarrhea.   PHYSICAL EXAMINATION: GENERAL:   The patient was alert and oriented, not in distress.  VITAL SIGNS: Temperature is 98.4, pulse 99, respirations 20, blood pressure 149/89, pulse ox 95% on room air.  HEART:  S1, S2. No JVD.  LUNGS:  Clear to auscultation.  ABDOMEN:  Soft, obese. There is a colostomy noted.  EXTREMITIES:  Trace edema.  NEUROLOGIC:  Alert and oriented x 3. No obvious focal signs.   LABS:  Glucose 426, BUN 15, creatinine 1.32, sodium 134, potassium 3.8, chloride 102, CO2 OF 23, calcium 9.3. Bilirubin 0.6, ALT 30, AST 19. Troponin 0.06. WBC count 12.3, hemoglobin 15.5, platelet count 177. EKG is normal sinus rhythm, no acute changes.   IMPRESSIONS: 1.  Syncope.  2.  Uncontrolled diabetes.  3.  Elevated troponin.  4.  History of hypertension.  5.  History of adenocarcinoma of the rectum, status post resection.  PLAN: Admit patient to [facility]. Will start him on normal saline. Will also cycle cardiac enzymes. Will obtain a carotid duplex ultrasound and CT head. Would also place patient on sliding scale insulin. The patient's diabetes probably could be well-managed with oral agent, and will need diabetic teaching as well.   Discussed plan of care with patient and family, who are in agreement.    ____________________________ Tracie Harrier, MD vh:mr D: 12/16/2012 18:39:08 ET T: 12/16/2012 19:03:57 ET JOB#: [id number]  cc: Tracie Harrier, MD, <Dictator> Tracie Harrier MD ELECTRONICALLY SIGNED 01/01/2013 17:39

## 2014-08-22 NOTE — Discharge Summary (Signed)
PATIENT NAME:  Donald Townsend, Donald Townsend MR#:  [id number] DATE OF BIRTH:  06/04/1950  DATE OF ADMISSION:  03/26/2011 DATE OF DISCHARGE:  04/02/2011  HISTORY OF PRESENT ILLNESS: This 63 year old was admitted for elective abdominoperineal resection. He had recent history of findings of cancer of the rectum. He has had preoperative radiation and chemotherapy. He had endoscopic ultrasound which demonstrated this was a T3 lesion.   PAST MEDICAL HISTORY:  1. Morbid obesity. 2. Adult-onset diabetes mellitus. 3. Hypertension.   MEDICATIONS: Hydrochlorothiazide 12.5 mg daily. Diabetes has recently been controlled with diet.   PHYSICAL EXAMINATION: VITAL SIGNS: Height '5\' 11"'$ , weight 276 pounds. LUNGS: Clear. HEART: Regular rhythm, S1 and S2. RECTAL: Rectal exam with palpable mass.   HOSPITAL COURSE: Patient did have bowel preparation at home and was brought into the hospital prepared for surgery and had abdominoperineal resection. Did receive a preoperative prophylactic antibiotic and did receive prophylactic subcutaneous heparin while in the hospital and he was observed for a number of days. Did have a Foley catheter in for several days. Also had a Blake drain both of which were removed while in the hospital. He was begun initially on a liquid diet, gradually advanced to solid food. Did demonstrate bowel function through his colostomy and did receive colostomy teaching while in the hospital.   Pathology demonstrated invasive poorly differentiated adenocarcinoma of the rectum. The tumor size was 2.7 cm in dimension. There was microscopic tumor extension through the muscularis propria into the perirectal adipose tissues. Margins were negative. There was metastatic carcinoma found in five out of eight regional perirectal mesenteric lymph nodes.   FINAL DIAGNOSIS: Carcinoma of the rectum.   DISCHARGE INSTRUCTIONS: Discharge instructions were given. He was given a prescription for Vicodin to take if needed for pain and  also advised to take docusate sodium to help prevent constipation. Plans were made for office follow up and also a follow up oncology evaluation and treatment.   ____________________________ J. Rochel Brome, MD jws:cms D: 05/01/2011 12:59:44 ET T: 05/02/2011 11:11:20 ET JOB#: [id number]  cc: Loreli Dollar, MD, <Dictator> Loreli Dollar MD ELECTRONICALLY SIGNED 05/05/2011 17:32

## 2014-08-23 LAB — SURGICAL PATHOLOGY

## 2015-08-09 ENCOUNTER — Ambulatory Visit: Payer: Self-pay

## 2015-08-09 ENCOUNTER — Other Ambulatory Visit: Payer: Self-pay

## 2015-08-15 ENCOUNTER — Inpatient Hospital Stay: Payer: Self-pay

## 2015-08-15 ENCOUNTER — Inpatient Hospital Stay: Payer: Self-pay | Admitting: Family Medicine

## 2015-08-29 ENCOUNTER — Inpatient Hospital Stay: Payer: Self-pay

## 2015-08-29 ENCOUNTER — Inpatient Hospital Stay: Payer: Self-pay | Admitting: Family Medicine

## 2015-08-29 ENCOUNTER — Other Ambulatory Visit: Payer: Self-pay | Admitting: *Deleted

## 2015-08-29 DIAGNOSIS — C189 Malignant neoplasm of colon, unspecified: Secondary | ICD-10-CM

## 2016-02-20 DIAGNOSIS — Z23 Encounter for immunization: Secondary | ICD-10-CM | POA: Diagnosis not present

## 2016-02-22 DIAGNOSIS — I481 Persistent atrial fibrillation: Secondary | ICD-10-CM | POA: Diagnosis not present

## 2016-02-22 DIAGNOSIS — I10 Essential (primary) hypertension: Secondary | ICD-10-CM | POA: Diagnosis not present

## 2016-02-22 DIAGNOSIS — G4733 Obstructive sleep apnea (adult) (pediatric): Secondary | ICD-10-CM | POA: Diagnosis not present

## 2016-04-19 ENCOUNTER — Emergency Department: Payer: Medicare Other

## 2016-04-19 ENCOUNTER — Encounter: Payer: Self-pay | Admitting: Emergency Medicine

## 2016-04-19 ENCOUNTER — Emergency Department
Admission: EM | Admit: 2016-04-19 | Discharge: 2016-04-19 | Disposition: A | Discharge status: Home / Self Care | Payer: Medicare Other | Attending: Emergency Medicine | Admitting: Emergency Medicine

## 2016-04-19 DIAGNOSIS — M7989 Other specified soft tissue disorders: Secondary | ICD-10-CM | POA: Diagnosis not present

## 2016-04-19 DIAGNOSIS — R600 Localized edema: Secondary | ICD-10-CM | POA: Diagnosis not present

## 2016-04-19 DIAGNOSIS — Z85038 Personal history of other malignant neoplasm of large intestine: Secondary | ICD-10-CM | POA: Diagnosis not present

## 2016-04-19 DIAGNOSIS — E119 Type 2 diabetes mellitus without complications: Secondary | ICD-10-CM | POA: Diagnosis not present

## 2016-04-19 DIAGNOSIS — I10 Essential (primary) hypertension: Secondary | ICD-10-CM | POA: Insufficient documentation

## 2016-04-19 DIAGNOSIS — R609 Edema, unspecified: Secondary | ICD-10-CM

## 2016-04-19 HISTORY — DX: Unspecified atrial fibrillation: I48.91

## 2016-04-19 HISTORY — DX: Malignant (primary) neoplasm, unspecified: C80.1

## 2016-04-19 HISTORY — DX: Type 2 diabetes mellitus without complications: E11.9

## 2016-04-19 HISTORY — DX: Essential (primary) hypertension: I10

## 2016-04-19 LAB — CBC WITH DIFFERENTIAL/PLATELET
BASOS ABS: 0.1 10*3/uL (ref 0–0.1)
Basophils Relative: 1 %
EOS ABS: 0.4 10*3/uL (ref 0–0.7)
EOS PCT: 3 %
HCT: 42.3 % (ref 40.0–52.0)
Hemoglobin: 14.5 g/dL (ref 13.0–18.0)
Lymphocytes Relative: 13 %
Lymphs Abs: 1.7 10*3/uL (ref 1.0–3.6)
MCH: 30.4 pg (ref 26.0–34.0)
MCHC: 34.3 g/dL (ref 32.0–36.0)
MCV: 88.8 fL (ref 80.0–100.0)
Monocytes Absolute: 1.1 10*3/uL — ABNORMAL HIGH (ref 0.2–1.0)
Monocytes Relative: 8 %
Neutro Abs: 9.9 10*3/uL — ABNORMAL HIGH (ref 1.4–6.5)
Neutrophils Relative %: 75 %
PLATELETS: 254 10*3/uL (ref 150–440)
RBC: 4.76 MIL/uL (ref 4.40–5.90)
RDW: 14.7 % — AB (ref 11.5–14.5)
WBC: 13.3 10*3/uL — ABNORMAL HIGH (ref 3.8–10.6)

## 2016-04-19 LAB — COMPREHENSIVE METABOLIC PANEL
ALT: 24 U/L (ref 17–63)
AST: 32 U/L (ref 15–41)
Albumin: 3.7 g/dL (ref 3.5–5.0)
Alkaline Phosphatase: 46 U/L (ref 38–126)
Anion gap: 10 (ref 5–15)
BUN: 24 mg/dL — AB (ref 6–20)
CO2: 25 mmol/L (ref 22–32)
CREATININE: 1.07 mg/dL (ref 0.61–1.24)
Calcium: 9.5 mg/dL (ref 8.9–10.3)
Chloride: 103 mmol/L (ref 101–111)
GFR calc Af Amer: >60 mL/min (ref >60)
GFR calc non Af Amer: >60 mL/min (ref >60)
Glucose, Bld: 105 mg/dL — ABNORMAL HIGH (ref 65–99)
POTASSIUM: 3.9 mmol/L (ref 3.5–5.1)
Sodium: 138 mmol/L (ref 135–145)
TOTAL PROTEIN: 7.3 g/dL (ref 6.5–8.1)
Total Bilirubin: 0.8 mg/dL (ref 0.3–1.2)

## 2016-04-19 LAB — SEDIMENTATION RATE: Sed Rate: 25 mm/hr — ABNORMAL HIGH (ref 0–20)

## 2016-04-19 LAB — CK: CK TOTAL: 89 U/L (ref 49–397)

## 2016-04-19 NOTE — Discharge Instructions (Signed)
Please purchase compression stockings to wear on the left lower leg.   Keep left leg elevated to lessen swelling.   Continue all medications as previously scheduled.

## 2016-04-19 NOTE — ED Provider Notes (Signed)
[facility] Emergency Department Provider Note  ____________________________________________  Time seen: Approximately 1:57 PM  I have reviewed the triage vital signs and the nursing notes.   HISTORY  Chief Complaint Leg Swelling    HPI Donald Townsend is a 65 y.o. male , NAD, presents emergency Department with 2 day history of left lower leg swelling. States he noticed his left leg with swelling yesterday afternoon. Has not noted any pain, redness, abnormal warmth, skin sores, oozing or weeping. Has had no numbness, weakness or tingling. Denies any injury, traumas or falls to the leg. Has no pain in any other part of his body. Denies chest pain, shortness of breath, abdominal pain, nausea or vomiting. No previous history of edema. Has had no fevers, chills or body aches. No medications. Is diabetic but states he takes his metformin on a daily basis, has lost 30 pounds and notes that her sugars have been very well controlled.   Past Medical History:  Diagnosis Date  . A-fib ([place])   . Cancer [facility])    Colon  . Diabetes mellitus without complication ([place])   . Hypertension     There are no active problems to display for this patient.   Past Surgical History:  Procedure Laterality Date  . COLONOSCOPY      Prior to Admission medications   Not on File    Allergies Patient has no known allergies.  No family history on file.  Social History Social History  Substance Use Topics  . Smoking status: Never Smoker  . Smokeless tobacco: Never Used  . Alcohol use No     Review of Systems  Constitutional: No fever/chills, Fatigue Cardiovascular: Positive left lower leg swelling. No chest pain, palpitations. Respiratory: No cough. No shortness of breath. No wheezing.  Gastrointestinal: No abdominal pain.  No nausea, vomiting.   Musculoskeletal: Negative for back pain.  Skin: Negative for rash, redness, abnormal warmth, skin sores, oozing, weeping or  bleeding. Neurological: Negative for this, weakness, tingling. 10-point ROS otherwise negative.  ____________________________________________   PHYSICAL EXAM:  VITAL SIGNS: ED Triage Vitals [04/19/16 1237]  Enc Vitals Group     BP (!) 146/95     Pulse Rate 67     Resp 18     Temp (!) 96.6 F (35.9 C)     Temp Source Oral     SpO2 97 %     Weight 279 lb (126.6 kg)     Height '5\' 10"'$  (1.778 m)     Head Circumference      Peak Flow      Pain Score 0     Pain Loc      Pain Edu?      Excl. in [place]?      Constitutional: Alert and oriented. Well appearing and in no acute distress. Eyes: Conjunctivae are normal.  Head: Atraumatic. Hematological/Lymphatic/Immunilogical:  No inguinal lymphadenopathy. Cardiovascular: Normal rate, regular rhythm. Normal S1 and S2.  No murmurs, rubs, gallops. Good peripheral circulation with 2+ pulses noted in bilateral lower extremities. Capillary refill is brisk in all digits of the left foot. Respiratory: Normal respiratory effort without tachypnea or retractions. Lungs CTAB with breath sounds noted in all lung fields. No wheeze, rhonchi, rales Musculoskeletal: Left lower extremity with significant edema ranging from the distal foot to above the left knee but with trace pitting only noted distally. Full range of motion of the left hip, knee, ankle and foot without pain or difficulty.  No joint effusions.  No tenderness with deep palpation of the left hip, left inguinal region, left thigh, left knee or left lower leg. No masses behind the left knee.  Neurologic:  Normal speech and language. Normal gait and posture. No gross focal neurologic deficits are appreciated. Sensation to light touch grossly intact about the left lower extremity. Skin:  Skin is warm, dry and intact. No rash, redness, abnormal warmth, oozing, weeping, skin sores noted. Psychiatric: Mood and affect are normal. Speech and behavior are normal. Patient exhibits appropriate insight and  judgement.   ____________________________________________   LABS (all labs ordered are listed, but only abnormal results are displayed)  Labs Reviewed  COMPREHENSIVE METABOLIC PANEL - Abnormal; Notable for the following:       Result Value   Glucose, Bld 105 (*)    BUN 24 (*)    All other components within normal limits  CBC WITH DIFFERENTIAL/PLATELET - Abnormal; Notable for the following:    WBC 13.3 (*)    RDW 14.7 (*)    Neutro Abs 9.9 (*)    Monocytes Absolute 1.1 (*)    All other components within normal limits  SEDIMENTATION RATE - Abnormal; Notable for the following:    Sed Rate 25 (*)    All other components within normal limits  CK   ____________________________________________  EKG  None ____________________________________________  RADIOLOGY I, Judithe Modest Kaleisha Bhargava, personally viewed and evaluated these images (plain radiographs) as part of my medical decision making, as well as reviewing the written report by the radiologist.  Dg Ankle Complete Left  Result Date: 04/19/2016 CLINICAL DATA:  Left ankle swelling for several days; no known injury; no pain EXAM: LEFT ANKLE COMPLETE - 3+ VIEW COMPARISON:  None. FINDINGS: There is no evidence of fracture, dislocation, or joint effusion. Mild tibiotalar osteoarthritis. Plantar calcaneal spur. Soft tissue swelling circumferentially around the ankle. IMPRESSION: No acute osseous injury of the left ankle. Electronically Signed   By: Kathreen Devoid   On: 04/19/2016 15:37   US Venous Img Lower Unilateral Left  Result Date: 04/19/2016 CLINICAL DATA:  Left lower extremity swelling for 2 days. History of colon cancer. EXAM: LEFT LOWER EXTREMITY VENOUS DOPPLER ULTRASOUND TECHNIQUE: Gray-scale sonography with graded compression, as well as color Doppler and duplex ultrasound were performed to evaluate the lower extremity deep venous systems from the level of the common femoral vein and including the common femoral, femoral, profunda  femoral, popliteal and calf veins including the posterior tibial, peroneal and gastrocnemius veins when visible. The superficial great saphenous vein was also interrogated. Spectral Doppler was utilized to evaluate flow at rest and with distal augmentation maneuvers in the common femoral, femoral and popliteal veins. COMPARISON:  None. FINDINGS: Contralateral Common Femoral Vein: Respiratory phasicity is normal and symmetric with the symptomatic side. No evidence of thrombus. Normal compressibility. Common Femoral Vein: No evidence of thrombus. Normal compressibility and response to augmentation. Saphenofemoral Junction: No evidence of thrombus. Normal compressibility and flow on color Doppler imaging. Profunda Femoral Vein: No evidence of thrombus. Normal compressibility and flow on color Doppler imaging. Femoral Vein: No evidence of thrombus. Normal compressibility and response to augmentation. Popliteal Vein: No evidence of thrombus. Normal compressibility, respiratory phasicity and response to augmentation. Calf Veins: No evidence of thrombus. Normal compressibility and flow on color Doppler imaging. Superficial Great Saphenous Vein: No evidence of thrombus. Normal compressibility and flow on color Doppler imaging. Venous Reflux:  None. Other Findings:  None. IMPRESSION: No significant left lower extremity DVT. Electronically Signed   By:  M.  Shick M.D.   On: 04/19/2016 13:53    ____________________________________________    PROCEDURES  Procedure(s) performed: None   Procedures   Medications - No data to display   ____________________________________________   INITIAL IMPRESSION / ASSESSMENT AND PLAN / ED COURSE  Pertinent labs & imaging results that were available during my care of the patient were reviewed by me and considered in my medical decision making (see chart for details).  Clinical Course as of Apr 19 1608  Thu Apr 19, 2016  1505 I spoke with Dr. Rudene Re in  regards to the patient's presentation, physical exam and imaging results. She personally examined the patient, reviewed lab results and imaging and suggests completing a left ankle x-ray to rule out stress fracture considering most of the swelling seems to be in the lower extremity. Otherwise the patient may be discharged home with follow-up with his primary care provider as well as cardiologist for further evaluation and treatment since evaluation in the emergency department has been negative and reassuring.  [JH]    Clinical Course User Index [JH] Peterson Mathey L Vaishali Baise, PA-C    Patient's diagnosis is consistent with Peripheral edema. Ultrasound of the left lower extremity the was negative for DVT, left ankle x-ray negative for any signs of fracture. Patient's lab results were without gross abnormalities. Mild elevation of white blood count and sedimentation rate but again no alarming elevations and patient has no evidence of infectious cellulitis about the lower extremity. Patient will be discharged home with instructions to continue all previously prescribed medications as directed. Patient also advised to purchase a compression stockings to wear on the left lower extremity and keep the left leg elevated when not ambulate. I encouraged the patient to rest and keep the leg elevated until the swelling has completely resolved. Patient is to follow up with his primary care provider and cardiologist for follow-up next week. Patient is given strict return precautions to return to the ED for any worsening or new symptoms.   ____________________________________________  FINAL CLINICAL IMPRESSION(S) / ED DIAGNOSES  Final diagnoses:  Peripheral edema      NEW MEDICATIONS STARTED DURING THIS VISIT:  New Prescriptions   No medications on file         Braxton Feathers, PA-C 04/19/16 [place], MD 04/20/16 1013

## 2016-04-19 NOTE — ED Triage Notes (Signed)
Patient to ER with left leg swelling. No redness or warmth present. Denies any injury. Denies any history of DVT. Denies recent long distance travel. Nonsmoker.

## 2016-04-27 ENCOUNTER — Other Ambulatory Visit: Payer: Self-pay | Admitting: Cardiology

## 2016-04-27 ENCOUNTER — Ambulatory Visit
Admission: RE | Admit: 2016-04-27 | Discharge: 2016-04-27 | Disposition: A | Discharge status: Home / Self Care | Payer: Medicare Other | Source: Ambulatory Visit | Attending: Cardiology | Admitting: Cardiology

## 2016-04-27 DIAGNOSIS — M7989 Other specified soft tissue disorders: Secondary | ICD-10-CM | POA: Diagnosis not present

## 2016-04-27 DIAGNOSIS — I10 Essential (primary) hypertension: Secondary | ICD-10-CM | POA: Diagnosis not present

## 2016-04-27 DIAGNOSIS — G4733 Obstructive sleep apnea (adult) (pediatric): Secondary | ICD-10-CM | POA: Diagnosis not present

## 2016-04-27 DIAGNOSIS — R935 Abnormal findings on diagnostic imaging of other abdominal regions, including retroperitoneum: Secondary | ICD-10-CM | POA: Diagnosis not present

## 2016-04-27 DIAGNOSIS — I700 Atherosclerosis of aorta: Secondary | ICD-10-CM | POA: Insufficient documentation

## 2016-04-27 DIAGNOSIS — I481 Persistent atrial fibrillation: Secondary | ICD-10-CM | POA: Diagnosis not present

## 2016-04-27 DIAGNOSIS — Z85038 Personal history of other malignant neoplasm of large intestine: Secondary | ICD-10-CM | POA: Diagnosis not present

## 2016-04-27 DIAGNOSIS — E119 Type 2 diabetes mellitus without complications: Secondary | ICD-10-CM | POA: Diagnosis not present

## 2016-04-27 MED ORDER — IOPAMIDOL (ISOVUE-300) INJECTION 61%
125.0000 mL | Freq: Once | INTRAVENOUS | Status: AC | PRN
  Administered 2016-04-27: 150 mL via INTRAVENOUS

## 2016-05-02 ENCOUNTER — Ambulatory Visit: Payer: PRIVATE HEALTH INSURANCE

## 2016-05-03 ENCOUNTER — Encounter (INDEPENDENT_AMBULATORY_CARE_PROVIDER_SITE_OTHER): Payer: Self-pay | Admitting: Vascular Surgery

## 2016-05-07 ENCOUNTER — Encounter (INDEPENDENT_AMBULATORY_CARE_PROVIDER_SITE_OTHER): Payer: Self-pay

## 2016-05-07 ENCOUNTER — Inpatient Hospital Stay
Admission: RE | Admit: 2016-05-07 | Discharge: 2016-05-09 | DRG: 272 | Disposition: A | Discharge status: Home / Self Care | Payer: Medicare Other | Source: Ambulatory Visit | Attending: Vascular Surgery | Admitting: Vascular Surgery

## 2016-05-07 ENCOUNTER — Encounter
Admission: RE | Admit: 2016-05-07 | Discharge: 2016-05-07 | Disposition: A | Discharge status: Home / Self Care | Payer: Medicare Other | Source: Ambulatory Visit | Attending: Vascular Surgery | Admitting: Vascular Surgery

## 2016-05-07 ENCOUNTER — Encounter (INDEPENDENT_AMBULATORY_CARE_PROVIDER_SITE_OTHER): Payer: Self-pay | Admitting: Vascular Surgery

## 2016-05-07 ENCOUNTER — Ambulatory Visit (INDEPENDENT_AMBULATORY_CARE_PROVIDER_SITE_OTHER): Payer: Medicare Other | Admitting: Vascular Surgery

## 2016-05-07 VITALS — BP 115/66 | HR 58 | Resp 17 | Ht 70.5 in | Wt 290.0 lb

## 2016-05-07 DIAGNOSIS — Z7982 Long term (current) use of aspirin: Secondary | ICD-10-CM | POA: Diagnosis not present

## 2016-05-07 DIAGNOSIS — I82402 Acute embolism and thrombosis of unspecified deep veins of left lower extremity: Secondary | ICD-10-CM

## 2016-05-07 DIAGNOSIS — M7989 Other specified soft tissue disorders: Secondary | ICD-10-CM | POA: Diagnosis not present

## 2016-05-07 DIAGNOSIS — M79605 Pain in left leg: Secondary | ICD-10-CM | POA: Diagnosis present

## 2016-05-07 DIAGNOSIS — Z86718 Personal history of other venous thrombosis and embolism: Secondary | ICD-10-CM | POA: Diagnosis present

## 2016-05-07 DIAGNOSIS — Z7984 Long term (current) use of oral hypoglycemic drugs: Secondary | ICD-10-CM

## 2016-05-07 DIAGNOSIS — I509 Heart failure, unspecified: Secondary | ICD-10-CM | POA: Diagnosis present

## 2016-05-07 DIAGNOSIS — E1310 Other specified diabetes mellitus with ketoacidosis without coma: Secondary | ICD-10-CM

## 2016-05-07 DIAGNOSIS — Z7901 Long term (current) use of anticoagulants: Secondary | ICD-10-CM

## 2016-05-07 DIAGNOSIS — I4891 Unspecified atrial fibrillation: Secondary | ICD-10-CM | POA: Diagnosis present

## 2016-05-07 DIAGNOSIS — Z85038 Personal history of other malignant neoplasm of large intestine: Secondary | ICD-10-CM | POA: Diagnosis not present

## 2016-05-07 DIAGNOSIS — Z79899 Other long term (current) drug therapy: Secondary | ICD-10-CM | POA: Diagnosis not present

## 2016-05-07 DIAGNOSIS — I110 Hypertensive heart disease with heart failure: Secondary | ICD-10-CM | POA: Diagnosis present

## 2016-05-07 DIAGNOSIS — E119 Type 2 diabetes mellitus without complications: Secondary | ICD-10-CM | POA: Diagnosis not present

## 2016-05-07 HISTORY — DX: Heart failure, unspecified: I50.9

## 2016-05-07 HISTORY — DX: Hyperlipidemia, unspecified: E78.5

## 2016-05-07 LAB — CREATININE, SERUM
CREATININE: 1.01 mg/dL (ref 0.61–1.24)
GFR calc Af Amer: >60 mL/min (ref >60)

## 2016-05-07 LAB — BUN: BUN: 21 mg/dL — AB (ref 6–20)

## 2016-05-07 MED ORDER — DEXTROSE 5 % IV SOLN: 1.5000 g | INTRAVENOUS | Status: DC

## 2016-05-07 NOTE — Progress Notes (Signed)
Subjective:    Patient ID: Donald Townsend, male    DOB: [birth year], 66 y.o.   MRN: [id number] Chief Complaint  Patient presents with  . New Patient (Initial Visit)   Presents as a new patient referred by Dr. Ubaldo Glassing for left lower extremity swelling and discomfort. Patient states swelling occurred about two weeks ago. Only present in his left lower extremity. Patient states swelling from foot to hip. Elevation is not helping. He has not tried any compression. This is the first time he has experiences any swelling to this extent. States some discomfort accompanied with the swelling especially at the end of the day.   04/19/16: LLE Venous Duplex: No significant left lower extremity DVT.  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 This finding is associated with asymmetric soft tissue swelling about the imaged portions of the left thigh.  Patient on Eliquis. Denies history of DVT, trauma / recent surgery to the extremity or history of clotting / bleeding disorder. Denies any SOB / chest pain.    Review of Systems  Constitutional: Negative.   HENT: Negative.   Eyes: Negative.   Respiratory: Negative.   Cardiovascular: Positive for leg swelling (Left).  Gastrointestinal: Negative.   Endocrine: Negative.   Genitourinary: Negative.   Musculoskeletal: Negative.   Skin: Negative.   Allergic/Immunologic: Negative.   Neurological: Negative.   Hematological:       DVT  Psychiatric/Behavioral: Negative.       Objective:   Physical Exam  Constitutional: He is oriented to person, place, and time. He appears well-developed and well-nourished.  HENT:  Head: Normocephalic and atraumatic.  Right Ear: External ear normal.  Left Ear: External ear normal.  Eyes: Conjunctivae and EOM are normal. Pupils are equal, round, and  reactive to light.  Neck: Normal range of motion.  Cardiovascular: Normal rate, regular rhythm, normal heart sounds and intact distal pulses.   Pulses:      Radial pulses are 2+ on the right side, and 2+ on the left side.       Dorsalis pedis pulses are 2+ on the right side, and 2+ on the left side.       Posterior tibial pulses are 2+ on the right side, and 2+ on the left side.  Pulmonary/Chest: Effort normal and breath sounds normal. No respiratory distress. He has no wheezes. He has no rales.  Abdominal: Soft. Bowel sounds are normal. He exhibits no distension. There is no tenderness. There is no rebound.  Musculoskeletal: Normal range of motion. He exhibits edema (Moderate swelling of the left lower extremity).  Neurological: He is alert and oriented to person, place, and time.  Skin: Skin is warm and dry.  Psychiatric: He has a normal mood and affect. His behavior is normal. Judgment and thought content normal.   BP 115/66   Pulse (!) 58   Resp 17   Ht 5' 10.5" (1.791 m)   Wt 290 lb (131.5 kg)   BMI 41.02 kg/m   Past Medical History:  Diagnosis Date  . A-fib ([place])   . Cancer [facility])    Colon  . CHF (congestive heart failure) ([place])   . Diabetes mellitus without complication ([place])   . Elevated lipids   . Hypertension    Social History   Social History  . Marital status: Divorced  Spouse name: N/A  . Number of children: N/A  . Years of education: N/A   Occupational History  . Not on file.   Social History Main Topics  . Smoking status: Never Smoker  . Smokeless tobacco: Never Used  . Alcohol use No  . Drug use: No  . Sexual activity: Not on file   Other Topics Concern  . Not on file   Social History Narrative  . No narrative on file   Past Surgical History:  Procedure Laterality Date  . COLON RESECTION    . COLONOSCOPY     Family History  Problem Relation Age of Onset  . Diabetes Mother   . Stroke Mother    No Known Allergies     Assessment &  Plan:  Presents as a new patient referred by Dr. Ubaldo Glassing for left lower extremity swelling and discomfort. Patient states swelling occurred about two weeks ago. Only present in his left lower extremity. Patient states swelling from foot to hip. Elevation is not helping. He has not tried any compression. This is the first time he has experiences any swelling to this extent. States some discomfort accompanied with the swelling especially at the end of the day.   04/19/16: LLE Venous Duplex: No significant left lower extremity DVT.  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 This finding is associated with asymmetric soft tissue swelling about the imaged portions of the left thigh.  Patient on Eliquis. Denies history of DVT, trauma / recent surgery to the extremity or history of clotting / bleeding disorder. Denies any SOB / chest pain.   1. Swelling of limb - [place] indefinitely from patients proximal DVT. Continue elevation. Patient scheduled for CT venogram to assess anatomy / rule out May Thurner syndrome  2. 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 Patient scheduled for CT venogram to assess anatomy / rule out May Thurner syndrome Patient on Eliquis.  3. Uncontrolled type 2 diabetes mellitus with ketoacidosis without coma, unspecified long term insulin use status (HCC) - Stable Encouraged good control as its slows the progression of atherosclerotic disease  No current outpatient prescriptions on file prior to visit.   No current facility-administered medications on  file prior to visit.     There are no Patient Instructions on file for this visit. Return if symptoms worsen or fail to improve.   Dabney Dever A Niclas Markell, PA-C

## 2016-05-08 ENCOUNTER — Encounter
Admission: RE | Disposition: A | Discharge status: Home / Self Care | Payer: Self-pay | Source: Ambulatory Visit | Attending: Vascular Surgery

## 2016-05-08 ENCOUNTER — Encounter: Payer: Self-pay | Admitting: *Deleted

## 2016-05-08 DIAGNOSIS — E119 Type 2 diabetes mellitus without complications: Secondary | ICD-10-CM | POA: Diagnosis present

## 2016-05-08 DIAGNOSIS — I110 Hypertensive heart disease with heart failure: Secondary | ICD-10-CM | POA: Diagnosis present

## 2016-05-08 DIAGNOSIS — Z7984 Long term (current) use of oral hypoglycemic drugs: Secondary | ICD-10-CM | POA: Diagnosis not present

## 2016-05-08 DIAGNOSIS — I509 Heart failure, unspecified: Secondary | ICD-10-CM | POA: Diagnosis present

## 2016-05-08 DIAGNOSIS — Z7901 Long term (current) use of anticoagulants: Secondary | ICD-10-CM | POA: Diagnosis not present

## 2016-05-08 DIAGNOSIS — Z86718 Personal history of other venous thrombosis and embolism: Secondary | ICD-10-CM | POA: Diagnosis present

## 2016-05-08 DIAGNOSIS — I82402 Acute embolism and thrombosis of unspecified deep veins of left lower extremity: Secondary | ICD-10-CM | POA: Diagnosis not present

## 2016-05-08 DIAGNOSIS — Z85038 Personal history of other malignant neoplasm of large intestine: Secondary | ICD-10-CM | POA: Diagnosis not present

## 2016-05-08 DIAGNOSIS — I4891 Unspecified atrial fibrillation: Secondary | ICD-10-CM | POA: Diagnosis present

## 2016-05-08 DIAGNOSIS — Z7982 Long term (current) use of aspirin: Secondary | ICD-10-CM | POA: Diagnosis not present

## 2016-05-08 DIAGNOSIS — M79605 Pain in left leg: Secondary | ICD-10-CM | POA: Diagnosis not present

## 2016-05-08 DIAGNOSIS — I998 Other disorder of circulatory system: Secondary | ICD-10-CM | POA: Diagnosis not present

## 2016-05-08 DIAGNOSIS — Z79899 Other long term (current) drug therapy: Secondary | ICD-10-CM | POA: Diagnosis not present

## 2016-05-08 HISTORY — PX: PERIPHERAL VASCULAR CATHETERIZATION: SHX172C

## 2016-05-08 LAB — CBC
HCT: 38.1 % — ABNORMAL LOW (ref 40.0–52.0)
Hemoglobin: 13 g/dL (ref 13.0–18.0)
MCH: 31.1 pg (ref 26.0–34.0)
MCHC: 34.1 g/dL (ref 32.0–36.0)
MCV: 91.3 fL (ref 80.0–100.0)
PLATELETS: 198 10*3/uL (ref 150–440)
RBC: 4.18 MIL/uL — AB (ref 4.40–5.90)
RDW: 13.8 % (ref 11.5–14.5)
WBC: 10.4 10*3/uL (ref 3.8–10.6)

## 2016-05-08 LAB — APTT: APTT: 37 s — AB (ref 24–36)

## 2016-05-08 LAB — PROTIME-INR
INR: 1.11
Prothrombin Time: 14.3 seconds (ref 11.4–15.2)

## 2016-05-08 LAB — HEPARIN LEVEL (UNFRACTIONATED): Heparin Unfractionated: 0.26 IU/mL — ABNORMAL LOW (ref 0.30–0.70)

## 2016-05-08 LAB — GLUCOSE, CAPILLARY: Glucose-Capillary: 231 mg/dL — ABNORMAL HIGH (ref 65–99)

## 2016-05-08 SURGERY — LOWER EXTREMITY VENOGRAPHY
Anesthesia: Moderate Sedation | Laterality: Left

## 2016-05-08 MED ORDER — MIDAZOLAM HCL 5 MG/5ML IJ SOLN
INTRAMUSCULAR | Status: AC
  Filled 2016-05-08: qty 5

## 2016-05-08 MED ORDER — SODIUM CHLORIDE 0.9 % IV SOLN
INTRAVENOUS | Status: DC
  Administered 2016-05-08: 13:00:00 via INTRAVENOUS

## 2016-05-08 MED ORDER — IOPAMIDOL (ISOVUE-300) INJECTION 61%
INTRAVENOUS | Status: DC | PRN
Start: 2016-05-08 — End: 2016-05-08
  Administered 2016-05-08: 90 mL via INTRA_ARTERIAL

## 2016-05-08 MED ORDER — ALUM & MAG HYDROXIDE-SIMETH 200-200-20 MG/5ML PO SUSP: 15.0000 mL | ORAL | Status: DC | PRN

## 2016-05-08 MED ORDER — METHYLPREDNISOLONE SODIUM SUCC 125 MG IJ SOLR: 125.0000 mg | INTRAMUSCULAR | Status: DC | PRN

## 2016-05-08 MED ORDER — HEPARIN SODIUM (PORCINE) 1000 UNIT/ML IJ SOLN
INTRAMUSCULAR | Status: DC | PRN
  Administered 2016-05-08: 4000 [IU] via INTRAVENOUS

## 2016-05-08 MED ORDER — FENTANYL CITRATE (PF) 100 MCG/2ML IJ SOLN
INTRAMUSCULAR | Status: DC | PRN
  Administered 2016-05-08 (×2): 50 ug via INTRAVENOUS

## 2016-05-08 MED ORDER — LOSARTAN POTASSIUM 50 MG PO TABS
100.0000 mg | ORAL_TABLET | Freq: Every day | ORAL | Status: DC
  Administered 2016-05-08 – 2016-05-09 (×2): 100 mg via ORAL
  Filled 2016-05-08 (×2): qty 2

## 2016-05-08 MED ORDER — PANTOPRAZOLE SODIUM 40 MG PO TBEC
40.0000 mg | DELAYED_RELEASE_TABLET | Freq: Every day | ORAL | Status: DC
  Administered 2016-05-08 – 2016-05-09 (×2): 40 mg via ORAL
  Filled 2016-05-08 (×2): qty 1

## 2016-05-08 MED ORDER — INSULIN ASPART 100 UNIT/ML ~~LOC~~ SOLN
0.0000 [IU] | Freq: Three times a day (TID) | SUBCUTANEOUS | Status: DC
  Administered 2016-05-09: 3 [IU] via SUBCUTANEOUS
  Administered 2016-05-09 (×2): 5 [IU] via SUBCUTANEOUS
  Filled 2016-05-08: qty 3
  Filled 2016-05-08: qty 5

## 2016-05-08 MED ORDER — DOCUSATE SODIUM 100 MG PO CAPS
100.0000 mg | ORAL_CAPSULE | Freq: Every day | ORAL | Status: DC
  Administered 2016-05-09: 100 mg via ORAL
  Filled 2016-05-08: qty 1

## 2016-05-08 MED ORDER — ONDANSETRON HCL 4 MG/2ML IJ SOLN: 4.0000 mg | Freq: Four times a day (QID) | INTRAMUSCULAR | Status: DC | PRN

## 2016-05-08 MED ORDER — ACETAMINOPHEN 325 MG PO TABS: 325.0000 mg | ORAL_TABLET | ORAL | Status: DC | PRN

## 2016-05-08 MED ORDER — HEPARIN (PORCINE) IN NACL 100-0.45 UNIT/ML-% IJ SOLN: 1400.0000 [IU]/h | INTRAMUSCULAR | Status: DC

## 2016-05-08 MED ORDER — OXYCODONE HCL 5 MG PO TABS: 5.0000 mg | ORAL_TABLET | ORAL | Status: DC | PRN

## 2016-05-08 MED ORDER — MIDAZOLAM HCL 2 MG/2ML IJ SOLN
INTRAMUSCULAR | Status: DC | PRN
  Administered 2016-05-08: 2 mg via INTRAVENOUS
  Administered 2016-05-08: 1 mg via INTRAVENOUS
  Administered 2016-05-08: 2 mg via INTRAVENOUS

## 2016-05-08 MED ORDER — FUROSEMIDE 40 MG PO TABS
40.0000 mg | ORAL_TABLET | Freq: Every day | ORAL | Status: DC
  Administered 2016-05-09: 40 mg via ORAL
  Filled 2016-05-08: qty 1

## 2016-05-08 MED ORDER — FENTANYL CITRATE (PF) 100 MCG/2ML IJ SOLN
INTRAMUSCULAR | Status: AC
  Filled 2016-05-08: qty 2

## 2016-05-08 MED ORDER — ALTEPLASE 2 MG IJ SOLR
INTRAMUSCULAR | Status: DC | PRN
  Administered 2016-05-08: 16 mg

## 2016-05-08 MED ORDER — HEPARIN (PORCINE) IN NACL 2-0.9 UNIT/ML-% IJ SOLN
INTRAMUSCULAR | Status: AC
  Filled 2016-05-08: qty 1000

## 2016-05-08 MED ORDER — FAMOTIDINE 20 MG PO TABS: 40.0000 mg | ORAL_TABLET | ORAL | Status: DC | PRN

## 2016-05-08 MED ORDER — CEFAZOLIN IN D5W 1 GM/50ML IV SOLN
1.0000 g | Freq: Once | INTRAVENOUS | Status: AC
  Administered 2016-05-08: 1 g via INTRAVENOUS

## 2016-05-08 MED ORDER — PRAVASTATIN SODIUM 20 MG PO TABS
20.0000 mg | ORAL_TABLET | Freq: Every day | ORAL | Status: DC
  Administered 2016-05-09: 20 mg via ORAL
  Filled 2016-05-08: qty 1

## 2016-05-08 MED ORDER — SODIUM CHLORIDE 0.9 % IV SOLN
INTRAVENOUS | Status: AC
  Administered 2016-05-09: 05:00:00 via INTRAVENOUS

## 2016-05-08 MED ORDER — HYDROMORPHONE HCL 1 MG/ML IJ SOLN: 1.0000 mg | Freq: Once | INTRAMUSCULAR | Status: DC

## 2016-05-08 MED ORDER — CLONIDINE HCL 0.1 MG PO TABS: 0.2000 mg | ORAL_TABLET | ORAL | Status: DC | PRN

## 2016-05-08 MED ORDER — ACETAMINOPHEN 325 MG RE SUPP: 325.0000 mg | RECTAL | Status: DC | PRN

## 2016-05-08 MED ORDER — METOPROLOL TARTRATE 25 MG PO TABS
25.0000 mg | ORAL_TABLET | Freq: Two times a day (BID) | ORAL | Status: DC
  Administered 2016-05-08 – 2016-05-09 (×2): 25 mg via ORAL
  Filled 2016-05-08 (×2): qty 1

## 2016-05-08 MED ORDER — MORPHINE SULFATE (PF) 4 MG/ML IV SOLN: 2.0000 mg | INTRAVENOUS | Status: DC | PRN

## 2016-05-08 MED ORDER — HEPARIN SODIUM (PORCINE) 1000 UNIT/ML IJ SOLN
INTRAMUSCULAR | Status: AC
  Filled 2016-05-08: qty 1

## 2016-05-08 MED ORDER — HEPARIN (PORCINE) IN NACL 100-0.45 UNIT/ML-% IJ SOLN
1600.0000 [IU]/h | INTRAMUSCULAR | Status: DC
  Administered 2016-05-08: 1400 [IU]/h via INTRAVENOUS
  Filled 2016-05-08 (×2): qty 250

## 2016-05-08 MED ORDER — HEPARIN (PORCINE) IN NACL 100-0.45 UNIT/ML-% IJ SOLN
INTRAMUSCULAR | Status: AC
  Filled 2016-05-08: qty 250

## 2016-05-08 MED ORDER — DIGOXIN 250 MCG PO TABS
0.2500 mg | ORAL_TABLET | Freq: Every day | ORAL | Status: DC
  Administered 2016-05-09: 0.25 mg via ORAL
  Filled 2016-05-08: qty 1

## 2016-05-08 MED ORDER — LIDOCAINE HCL (PF) 1 % IJ SOLN
INTRAMUSCULAR | Status: AC
  Filled 2016-05-08: qty 30

## 2016-05-08 MED ORDER — ASPIRIN EC 81 MG PO TBEC
81.0000 mg | DELAYED_RELEASE_TABLET | Freq: Every day | ORAL | Status: DC
  Administered 2016-05-09: 81 mg via ORAL
  Filled 2016-05-08: qty 1

## 2016-05-08 SURGICAL SUPPLY — 24 items
BALLN ARMADA 10X80X80 (BALLOONS) ×3
BALLN ARMADA 12X60X80 (BALLOONS) ×3
BALLOON ARMADA 10X80X80 (BALLOONS) ×1 IMPLANT
BALLOON ARMADA 12X60X80 (BALLOONS) ×1 IMPLANT
CANISTER PENUMBRA MAX (MISCELLANEOUS) ×3 IMPLANT
CATH BEACON 5.038 65CM KMP-01 (CATHETERS) ×3 IMPLANT
CATH INDIGO 8 XTORQ TIP 115CM (CATHETERS) ×3 IMPLANT
CATH INDIGO SEP 8 (CATHETERS) ×3 IMPLANT
DEVICE PRESTO INFLATION (MISCELLANEOUS) ×3 IMPLANT
DEVICE SAFEGUARD 24CM (GAUZE/BANDAGES/DRESSINGS) ×3 IMPLANT
GLIDEWIRE ADV .035X260CM (WIRE) ×3 IMPLANT
GOWN STRL XL  DISP (MISCELLANEOUS) ×6 IMPLANT
KIT FEMORAL DEL DENALI (Miscellaneous) ×3 IMPLANT
NEEDLE ENTRY 21GA 7CM ECHOTIP (NEEDLE) ×3 IMPLANT
PACK ANGIOGRAPHY (CUSTOM PROCEDURE TRAY) ×3 IMPLANT
PREP CHG 10.5 TEAL (MISCELLANEOUS) ×3 IMPLANT
SET INTRO CAPELLA COAXIAL (SET/KITS/TRAYS/PACK) ×3 IMPLANT
SHEATH BRITE TIP 6FRX11 (SHEATH) ×3 IMPLANT
SHEATH BRITE TIP 8FRX11 (SHEATH) ×3 IMPLANT
SHEATH PINNACLE 11FRX10 (SHEATH) ×3 IMPLANT
STENT VIABAHN 13X100X120 (Permanent Stent) ×3 IMPLANT
TOWEL OR 17X26 4PK STRL BLUE (TOWEL DISPOSABLE) ×3 IMPLANT
TUBING ASPIRATION INDIGO (MISCELLANEOUS) ×3 IMPLANT
WIRE J 3MM .035X145CM (WIRE) ×3 IMPLANT

## 2016-05-08 NOTE — Op Note (Signed)
[facility]                                                      OPERATIVE NOTE    PRE-OPERATIVE DIAGNOSIS: Symptomatic left leg DVT; colon cancer   POST-OPERATIVE DIAGNOSIS: Same  PROCEDURE: 1. Ultrasound guidance for vascular access to the left common femoral vein and the left popliteal vein 2. Catheter placement into the inferior vena cava for placement of IVC filter 3. Inferior venacavogram 4. Placement of a Denali IVC filter infrarenal 5. Introduction catheter into venous system second order catheter placement left popliteal approach 6. Infusion thrombolysis with 16 mg of TPA 7. Mechanical thrombectomy of the popliteal, SFV common femoral vein using the penumbra catheter 8.    Percutaneous transluminal and plasty and stent placement left common and external iliac vein  SURGEON: Hortencia Pilar  ASSISTANT(S): None  ANESTHESIA: local/conscious sedation.  Versed and fentanyl were administered intravenously throughout the entire procedure. The patient was monitored with ECG pulse oximetry end tidal CO2 and blood pressure by the interventional radiologist nurse. Total sedation time was approximately 110 minutes  ESTIMATED BLOOD LOSS: minimal  Sheath: 6 French sheath antegrade left common femoral vein; 11 French sheath antegrade left popliteal vein  Contrast: 90 cc  Fluoroscopy time: 13.2 minutes  FINDING(S): 1. Patent IVC; thrombus within the left common iliac as well as external iliac veins and common femoral vein on the left. There was external compression of the vein at the confluence of the external and internal iliac veins with the common iliac vein there is occlusion of the internal iliac vein.  SPECIMEN(S): none  INDICATIONS:  Donald Townsend is a 66 y.o. year old male who presents with massive swelling of the left leg quite painful. Inferior vena cava filter is indicated for this reason.  Risks and benefits including filter thrombosis, migration, fracture, bleeding, and infection were all discussed. We discussed that all IVC filters that we place can be removed if desired from the patient once the need for the filter has passed.   DESCRIPTION: After obtaining full informed written consent, the patient was brought back to the vascular suite. The skin was sterilely prepped and draped in a sterile surgical field was created. Previous duplex ultrasound is reported as patency of the left common femoral vein. I therefore elected to proceed via a common femoral venous of approach. The left common femoral vein was accessed under direct ultrasound guidance without difficulty with a Seldinger needle and a J-wire was then placed. The dilator is passed over the wire and a 6 French sheath was placed. Blood return from the sheath while attempting to flush it was very poor and therefore hand injection of contrast was performed through this sheath. This demonstrated a large amount of thrombus in the proximal half of the common femoral vein and occlusion of the external iliac vein as well. The Advantage wire was then negotiated through the occlusion and a Kumpe catheter was used to verify intraluminal positioning within the cava. Kumpe was removed and the 6 French sheath was removed and the delivery sheath was placed into the inferior vena cava. Inferior venacavogram was performed. This demonstrated a patent  IVC with the level of the renal veins at L1-L2. The filter was then deployed into the inferior vena cava at the level of inferior margin of L2 just below the renal veins. The delivery sheath was then removed. Pressure was held. Sterile dressings were placed. The patient tolerated the procedure well.  The patient was then repositioned to the prone and his popliteal fossa of the left leg was prepped and draped in a sterile fashion. Ultrasound was placed in a sterile sleeve. Ultrasound is utilized to gain  access to the popliteal vein. Ultrasound was used to image the vein which was noted to be echolucent and compressible suggesting it was patent. 1% lidocaine is infiltrated in soft tissues and subsequent microneedle is inserted into the popliteal vein without difficulty. Images recorded for the permanent record and the puncture is made under real-time visualization. Microwire is then advanced under fluoroscopic guidance and noted to follow the path of the superficial femoral vein. Therefore the micro-sheath was placed followed by a Magic torque wire and subsequently an 8 [place] sheath. Hand injection contrast demonstrated that the popliteal vein as well as the superficial femoral vein is widely patent. Again thrombus is noted in the proximal half of the common femoral vein that then leads up to and occlusion of the iliac veins  KMP catheter together with the Advantage wire then advanced up to the level of the common femoral. 16 mg of TPA was then reconstituted in 100 cc and delivered across the common external iliac veins as well as the common femoral vein using the KMP catheter.  The TPA is then allowed to dwell. Subsequently, the penumbra catheter is engaged in the aspiration mode and aspiration of the common femoral as well as the iliac veins is performed multiple passes are made with the volumes recorded in the procedure notes. A total of 350 cc of effluent is collected.  Follow-up imaging now demonstrates that the majority the thrombus is been eliminated from the proximal common iliac as well as the common femoral vein. There is an abnormality of the vein consistent with an external compression creating a stricture or stenosis within the external and common iliac vein there is a high-grade residual narrowing and therefore initially a 10 x 8 balloon was inflated to 6 atm for 1 minute. Follow-up imaging demonstrates there is significant residual stenosis.  Therefore the sheath in the popliteal fossa is upsized  to an 11 French sheath and a 13 mm x 100 mm Viabahn stent is delivered across the stricture extending from the proximal common iliac to the distal external iliac. This is then postdilated with a 12 x 60 balloon using 6 atm inflations for 30 seconds. After this maneuver follow-up imaging demonstrates near total resolution within the common femoral vein. Iliac veins are now widely patent and there is a tremendous improvement in flow.  The image of the IVC and filter demonstrates that the IVC and the filter remained widely patent with no evidence of thrombus within the filter. Given that the patient now is widely patent with minimal residual thrombus from the distal popliteal to the IVC the procedure is terminated. The wires removed the sheath is removed pressures held and a pressure dressing with Coban and is then applied. The patient is then returned to the supine position  Interpretation: Initial images demonstrated normal vena cava and the filter is placed without difficulty. Initial images the left lower extremity then demonstrated extensive thrombus throughout the common femoral as well as the common and  external iliac veins.  Following intervention described above there is near-total resolution of thrombus throughout.  COMPLICATIONS: None  CONDITION: Stable  Hortencia Pilar  05/08/2016,6:17 PM

## 2016-05-08 NOTE — Discharge Instructions (Signed)

## 2016-05-08 NOTE — H&P (Signed)
[facility] History & Physical Update  The patient was interviewed and re-examined.  The patient's previous History and Physical has been reviewed and is unchanged.  There is no change in the plan of care. We plan to proceed with the scheduled procedure.  Hortencia Pilar, MD  05/08/2016, 3:43 PM

## 2016-05-08 NOTE — Progress Notes (Signed)
ANTICOAGULATION CONSULT NOTE - Initial Consult  Pharmacy Consult for heparin drip Indication: DVT  No Known Allergies  Patient Measurements: Height: '5\' 10"'$  (177.8 cm) Weight: 285 lb (129.3 kg) IBW/kg (Calculated) : 73 Heparin Dosing Weight: 102 kg  Vital Signs: Temp: 98.2 F (36.8 C) (01/09 1158) Temp Source: Oral (01/09 1158) BP: 102/73 (01/09 1850) Pulse Rate: 98 (01/09 1850)  Labs:  Recent Labs  05/07/16 1049  CREATININE 1.01    Estimated Creatinine Clearance: 98.5 mL/min (by C-G formula based on SCr of 1.01 mg/dL).  Medical History: Past Medical History:  Diagnosis Date  . A-fib ([place])   . Cancer [facility])    Colon  . CHF (congestive heart failure) ([place])   . Diabetes mellitus without complication ([place])   . Elevated lipids   . Hypertension    Assessment: Pharmacy consulted to dose and monitor heparin drip in this 66 year old male for iliac DVT. Patient was taking apixaban prior to admission and reports that his last dose was the morning of 1/8. Baseline labs (CBC, APTT, INR, heparin level) were ordered STAT.  Patient underwent vascular procedure today for placement of IVC filter. The patient also received 4000 units of heparin and 16 mg of cathflo activase during procedure ~1700 this evening.   Goal of Therapy:  Heparin level 0.3-0.7 units/ml Monitor platelets by anticoagulation protocol: Yes   Plan:  NO bolus since patient received 4000 units of heparin as well as 16 mg of cathflo activase during procedure ~1700 this evening.  Start heparin infusion at 1400 units/hr Check anti-Xa level in 6 hours and daily while on heparin Continue to monitor H&H and platelets  Lenis Noon, PharmD, BCPS Clinical Pharmacist 05/08/2016,7:04 PM

## 2016-05-08 NOTE — Progress Notes (Signed)
Patient had ambulated to restroom when he had been told not to get out of bed after surgery with bedrest orders.  Patient had a copious amount of blood from dressing and pressure immediatly applied to left popliteal.  Dr Hortencia Pilar paged and advised to lay patient on stomach and apply pressure for 20 mins.  No further signs of active bleeding, will continue to monitor.

## 2016-05-09 ENCOUNTER — Encounter: Payer: Self-pay | Admitting: Vascular Surgery

## 2016-05-09 DIAGNOSIS — I82402 Acute embolism and thrombosis of unspecified deep veins of left lower extremity: Secondary | ICD-10-CM

## 2016-05-09 LAB — CBC
HCT: 36.3 % — ABNORMAL LOW (ref 40.0–52.0)
Hemoglobin: 12.5 g/dL — ABNORMAL LOW (ref 13.0–18.0)
MCH: 31.7 pg (ref 26.0–34.0)
MCHC: 34.5 g/dL (ref 32.0–36.0)
MCV: 91.7 fL (ref 80.0–100.0)
Platelets: 181 10*3/uL (ref 150–440)
RBC: 3.96 MIL/uL — ABNORMAL LOW (ref 4.40–5.90)
RDW: 13.8 % (ref 11.5–14.5)
WBC: 8.4 10*3/uL (ref 3.8–10.6)

## 2016-05-09 LAB — HEPARIN LEVEL (UNFRACTIONATED)
HEPARIN UNFRACTIONATED: 0.25 [IU]/mL — AB (ref 0.30–0.70)
HEPARIN UNFRACTIONATED: 0.31 [IU]/mL (ref 0.30–0.70)
Heparin Unfractionated: 1.36 IU/mL — ABNORMAL HIGH (ref 0.30–0.70)

## 2016-05-09 LAB — GLUCOSE, CAPILLARY
GLUCOSE-CAPILLARY: 202 mg/dL — AB (ref 65–99)
Glucose-Capillary: 195 mg/dL — ABNORMAL HIGH (ref 65–99)

## 2016-05-09 MED ORDER — HEPARIN BOLUS VIA INFUSION
1500.0000 [IU] | Freq: Once | INTRAVENOUS | Status: AC
  Administered 2016-05-09: 1500 [IU] via INTRAVENOUS
  Filled 2016-05-09: qty 1500

## 2016-05-09 MED ORDER — HEPARIN (PORCINE) IN NACL 100-0.45 UNIT/ML-% IJ SOLN
1600.0000 [IU]/h | INTRAMUSCULAR | Status: DC
  Filled 2016-05-09: qty 250

## 2016-05-09 MED ORDER — APIXABAN 5 MG PO TABS
5.0000 mg | ORAL_TABLET | Freq: Two times a day (BID) | ORAL | Status: DC
  Administered 2016-05-09: 5 mg via ORAL
  Filled 2016-05-09 (×2): qty 1

## 2016-05-09 NOTE — Progress Notes (Addendum)
Inpatient Diabetes Program Recommendations  AACE/ADA: New Consensus Statement on Inpatient Glycemic Control (2015)  Target Ranges:  Prepandial:   less than 140 mg/dL      Peak postprandial:   less than 180 mg/dL (1-2 hours)      Critically ill patients:  140 - 180 mg/dL   Lab Results  Component Value Date   GLUCAP 202 (H) 05/09/2016    Review of Glycemic ControlResults for Donald Townsend, Donald Townsend (MRN [id number]) as of 05/09/2016 09:47  Ref. Range 05/08/2016 21:05 05/09/2016 07:42  Glucose-Capillary Latest Ref Range: 65 - 99 mg/dL 231 (H) 202 (H)   Diabetes history: Type 2 diabetes Outpatient Diabetes medications: Amaryl '4mg'$  daily, Metformin 1000 mg bid Current orders for Inpatient glycemic control:  Novolog moderate tid with meals  Inpatient Diabetes Program Recommendations:    Note that last recorded A1C per care everywhere was 9.1% in January 2017.  While in the hospital, consider adding Levemir 25 units daily.  Also please consider checking A1C to assess last 2-3 months of glycemic control.  Thanks, Adah Perl, RN, BC-ADM Inpatient Diabetes Coordinator Pager [phone] (8a-5p)

## 2016-05-09 NOTE — Discharge Summary (Signed)
[facility]    Discharge Summary    Patient ID:  Donald Townsend MRN: [id number] DOB/AGE: 1951/03/04 66 y.o.  Admit date: 05/08/2016 Discharge date: 05/09/2016 Date of Surgery: 05/08/2016 Surgeon: Surgeon(s): Katha Cabal, MD  Admission Diagnosis: Left lower venogram w possible stenting iliac vein    DVT  Discharge Diagnoses:  Left lower venogram w possible stenting iliac vein    DVT  Secondary Diagnoses: Past Medical History:  Diagnosis Date  . A-fib ([place])   . Cancer [facility])    Colon  . CHF (congestive heart failure) ([place])   . Diabetes mellitus without complication ([place])   . Elevated lipids   . Hypertension     Procedure(s): Lower Extremity Venography with thrombectomy and and plasty and stent placement left common and external iliac artery vein  Discharged Condition: good  HPI:  The patient underwent a successful thrombolysis with angioplasty and stent placement of the iliac veins both external and common on the day of admission. Thrombectomy was also performed of the common femoral. Postoperatively he was maintained on a heparin drip for 24 hours. He was also placed at bedrest. Today I removed his dressing site is clean and intact his leg is markedly less swollen and feels much better his pain is gone. He is felt fit for discharge. He is restarted on his Eliquis  Hospital Course:  Donald Townsend is a 66 y.o. male is S/P Left Procedure(s): Lower Extremity Venography Extubated: POD # 0 Physical exam: Left leg markedly less edematous skin is now wrinkled and soft no evidence of hematoma in the popliteal fossa. Post-op wounds clean, dry, intact or healing well Pt. Ambulating, voiding and taking PO diet without difficulty. Pt pain controlled with PO pain meds. Labs as below Complications:none  Consults:    Significant Diagnostic Studies: CBC Lab Results  Component Value Date   WBC 8.4 05/09/2016   HGB 12.5 (L) 05/09/2016   HCT  36.3 (L) 05/09/2016   MCV 91.7 05/09/2016   PLT 181 05/09/2016    BMET    Component Value Date/Time   NA 138 04/19/2016 1407   NA 139 12/17/2012 0608   K 3.9 04/19/2016 1407   K 3.8 12/17/2012 0608   CL 103 04/19/2016 1407   CL 108 (H) 12/17/2012 0608   CO2 25 04/19/2016 1407   CO2 26 12/17/2012 0608   GLUCOSE 105 (H) 04/19/2016 1407   GLUCOSE 217 (H) 12/17/2012 0608   BUN 21 (H) 05/07/2016 1049   BUN 15 12/17/2012 0608   CREATININE 1.01 05/07/2016 1049   CREATININE 0.84 12/17/2012 0608   CALCIUM 9.5 04/19/2016 1407   CALCIUM 8.2 (L) 12/17/2012 0608   GFRNONAA >60 05/07/2016 1049   GFRNONAA >60 12/17/2012 0608   GFRAA >60 05/07/2016 1049   GFRAA >60 12/17/2012 0608   COAG Lab Results  Component Value Date   INR 1.11 05/08/2016     Disposition:  Discharge to :Home Discharge Instructions    Call MD for:  redness, tenderness, or signs of infection (pain, swelling, bleeding, redness, odor or green/yellow discharge around incision site)    Complete by:  As directed    Call MD for:  severe or increased pain, loss or decreased feeling  in affected limb(s)    Complete by:  As directed    Call MD for:  temperature >100.5    Complete by:  As directed    Discharge instructions    Complete by:  As directed  Ok to shower   Driving Restrictions    Complete by:  As directed    No driving for 1 weeks   Lifting restrictions    Complete by:  As directed    No lifting greater than 20 pounds for 2 weeks   Resume previous diet    Complete by:  As directed      Allergies as of 05/09/2016   No Known Allergies     Medication List    TAKE these medications   aspirin EC 81 MG tablet Take by mouth daily.   digoxin 0.25 MG tablet Commonly known as:  LANOXIN Take by mouth.   ELIQUIS 5 MG Tabs tablet Generic drug:  apixaban Take 5 mg by mouth 2 (two) times daily.   FIFTY50 GLUCOSE METER 2.0 w/Device Kit Use as directed.   furosemide 40 MG tablet Commonly known as:   LASIX Take 40 mg by mouth daily.   glimepiride 4 MG tablet Commonly known as:  AMARYL Take 4 mg by mouth daily with breakfast.   Lancets 30G Misc Use 1 Units as directed. Check CBG's up to twice daily. Dx: E11.9   losartan 100 MG tablet Commonly known as:  COZAAR take 1 tablet by mouth once daily   metFORMIN 1000 MG tablet Commonly known as:  GLUCOPHAGE take 1 tablet by mouth twice a day with food   metoprolol tartrate 25 MG tablet Commonly known as:  LOPRESSOR Take 25 mg by mouth 2 (two) times daily.   pravastatin 20 MG tablet Commonly known as:  PRAVACHOL take 1 tablet by mouth once daily      Verbal and written Discharge instructions given to the patient. Wound care per Discharge AVS Follow-up Information    Hortencia Pilar, MD On 05/31/2016.   Specialties:  Vascular Surgery, Cardiology, Radiology, Vascular Surgery Why:  no studiesat 115 Contact information: [place] [address] [phone]           Signed: Hortencia Pilar, MD  05/09/2016, 4:06 PM

## 2016-05-09 NOTE — Progress Notes (Signed)
ANTICOAGULATION CONSULT NOTE - Initial Consult  Pharmacy Consult for heparin drip Indication: DVT  No Known Allergies  Patient Measurements: Height: '5\' 10"'$  (177.8 cm) Weight: 285 lb (129.3 kg) IBW/kg (Calculated) : 73 Heparin Dosing Weight: 102 kg  Vital Signs: Temp: 98.8 F (37.1 C) (01/10 0735) Temp Source: Oral (01/10 0735) BP: 144/90 (01/10 0735) Pulse Rate: 61 (01/10 0735)  Labs:  Recent Labs  05/07/16 1049 05/08/16 1854 05/09/16 0136 05/09/16 0756  HGB  --  13.0 12.5*  --   HCT  --  38.1* 36.3*  --   PLT  --  198 181  --   APTT  --  37*  --   --   LABPROT  --  14.3  --   --   INR  --  1.11  --   --   HEPARINUNFRC  --  0.26* 0.25* 0.31  CREATININE 1.01  --   --   --     Estimated Creatinine Clearance: 98.5 mL/min (by C-G formula based on SCr of 1.01 mg/dL).  Medical History: Past Medical History:  Diagnosis Date  . A-fib ([place])   . Cancer [facility])    Colon  . CHF (congestive heart failure) ([place])   . Diabetes mellitus without complication ([place])   . Elevated lipids   . Hypertension    Assessment: Pharmacy consulted to dose and monitor heparin drip in this 66 year old male for iliac DVT. Patient was taking apixaban prior to admission and reports that his last dose was the morning of 1/8. Baseline labs (CBC, APTT, INR, heparin level) were ordered STAT.  Patient underwent vascular procedure today for placement of IVC filter. The patient also received 4000 units of heparin and 16 mg of cathflo activase during procedure ~1700 this evening.   Goal of Therapy:  Heparin level 0.3-0.7 units/ml Monitor platelets by anticoagulation protocol: Yes   Plan:  NO bolus since patient received 4000 units of heparin as well as 16 mg of cathflo activase during procedure ~1700 this evening.  Start heparin infusion at 1400 units/hr Check anti-Xa level in 6 hours and daily while on heparin Continue to monitor H&H and platelets  0110 01:30 heparin level 0.25. 1500 unit bolus  and increase rate to 1600 units/hr. Recheck in 6 hours.  0110 0800 HL 0.31. Continue current rate and recheck HL in 6 hours.   Pernell Dupre, PharmD, BCPS Clinical Pharmacist 05/09/2016,1:20 PM

## 2016-05-09 NOTE — Progress Notes (Signed)
ANTICOAGULATION CONSULT NOTE - Initial Consult  Pharmacy Consult for heparin drip Indication: DVT  No Known Allergies  Patient Measurements: Height: '5\' 10"'$  (177.8 cm) Weight: 285 lb (129.3 kg) IBW/kg (Calculated) : 73 Heparin Dosing Weight: 102 kg  Vital Signs: Temp: 98.2 F (36.8 C) (01/09 2016) Temp Source: Oral (01/09 2016) BP: 128/62 (01/09 2016) Pulse Rate: 91 (01/09 2016)  Labs:  Recent Labs  05/07/16 1049 05/08/16 1854 05/09/16 0136  HGB  --  13.0 12.5*  HCT  --  38.1* 36.3*  PLT  --  198 181  APTT  --  37*  --   LABPROT  --  14.3  --   INR  --  1.11  --   HEPARINUNFRC  --  0.26* 0.25*  CREATININE 1.01  --   --     Estimated Creatinine Clearance: 98.5 mL/min (by C-G formula based on SCr of 1.01 mg/dL).  Medical History: Past Medical History:  Diagnosis Date  . A-fib ([place])   . Cancer [facility])    Colon  . CHF (congestive heart failure) ([place])   . Diabetes mellitus without complication ([place])   . Elevated lipids   . Hypertension    Assessment: Pharmacy consulted to dose and monitor heparin drip in this 66 year old male for iliac DVT. Patient was taking apixaban prior to admission and reports that his last dose was the morning of 1/8. Baseline labs (CBC, APTT, INR, heparin level) were ordered STAT.  Patient underwent vascular procedure today for placement of IVC filter. The patient also received 4000 units of heparin and 16 mg of cathflo activase during procedure ~1700 this evening.   Goal of Therapy:  Heparin level 0.3-0.7 units/ml Monitor platelets by anticoagulation protocol: Yes   Plan:  NO bolus since patient received 4000 units of heparin as well as 16 mg of cathflo activase during procedure ~1700 this evening.  Start heparin infusion at 1400 units/hr Check anti-Xa level in 6 hours and daily while on heparin Continue to monitor H&H and platelets  0110 01:30 heparin level 0.25. 1500 unit bolus and increase rate to 1600 units/hr. Recheck in 6  hours.  Henok Heacock S, PharmD, BCPS Clinical Pharmacist 05/09/2016,3:53 AM

## 2016-05-30 DIAGNOSIS — I481 Persistent atrial fibrillation: Secondary | ICD-10-CM | POA: Diagnosis not present

## 2016-05-30 DIAGNOSIS — I4891 Unspecified atrial fibrillation: Secondary | ICD-10-CM | POA: Diagnosis not present

## 2016-05-30 DIAGNOSIS — I82422 Acute embolism and thrombosis of left iliac vein: Secondary | ICD-10-CM | POA: Diagnosis not present

## 2016-05-30 DIAGNOSIS — I10 Essential (primary) hypertension: Secondary | ICD-10-CM | POA: Diagnosis not present

## 2016-05-30 DIAGNOSIS — Z6841 Body Mass Index (BMI) 40.0 and over, adult: Secondary | ICD-10-CM | POA: Diagnosis not present

## 2016-05-30 DIAGNOSIS — G4733 Obstructive sleep apnea (adult) (pediatric): Secondary | ICD-10-CM | POA: Diagnosis not present

## 2016-05-31 ENCOUNTER — Encounter (INDEPENDENT_AMBULATORY_CARE_PROVIDER_SITE_OTHER): Payer: Medicare Other | Admitting: Vascular Surgery

## 2016-06-07 ENCOUNTER — Encounter (INDEPENDENT_AMBULATORY_CARE_PROVIDER_SITE_OTHER): Payer: Self-pay | Admitting: Vascular Surgery

## 2016-06-07 ENCOUNTER — Ambulatory Visit (INDEPENDENT_AMBULATORY_CARE_PROVIDER_SITE_OTHER): Payer: Medicare Other | Admitting: Vascular Surgery

## 2016-06-07 VITALS — BP 127/74 | HR 53 | Resp 16 | Wt 279.8 lb

## 2016-06-07 DIAGNOSIS — I82422 Acute embolism and thrombosis of left iliac vein: Secondary | ICD-10-CM | POA: Diagnosis not present

## 2016-06-07 DIAGNOSIS — I482 Chronic atrial fibrillation, unspecified: Secondary | ICD-10-CM

## 2016-06-07 DIAGNOSIS — E782 Mixed hyperlipidemia: Secondary | ICD-10-CM

## 2016-06-07 DIAGNOSIS — E119 Type 2 diabetes mellitus without complications: Secondary | ICD-10-CM

## 2016-06-08 DIAGNOSIS — I10 Essential (primary) hypertension: Secondary | ICD-10-CM | POA: Diagnosis not present

## 2016-06-08 DIAGNOSIS — I481 Persistent atrial fibrillation: Secondary | ICD-10-CM | POA: Diagnosis not present

## 2016-06-08 DIAGNOSIS — I82422 Acute embolism and thrombosis of left iliac vein: Secondary | ICD-10-CM | POA: Diagnosis not present

## 2016-06-08 DIAGNOSIS — E119 Type 2 diabetes mellitus without complications: Secondary | ICD-10-CM | POA: Diagnosis not present

## 2016-06-09 DIAGNOSIS — E785 Hyperlipidemia, unspecified: Secondary | ICD-10-CM | POA: Insufficient documentation

## 2016-06-09 DIAGNOSIS — E119 Type 2 diabetes mellitus without complications: Secondary | ICD-10-CM | POA: Insufficient documentation

## 2016-06-09 DIAGNOSIS — I4891 Unspecified atrial fibrillation: Secondary | ICD-10-CM | POA: Insufficient documentation

## 2016-06-09 NOTE — Progress Notes (Signed)
MRN : [id number]  Donald Townsend is a 66 y.o. (Jul 19, 1950) male who presents with chief complaint of  Chief Complaint  Patient presents with  . Follow-up  .  History of Present Illness:   The patient presents to the office for evaluation of DVT.  DVT was identified at [facility] by Duplex ultrasound.  The initial symptoms were pain and swelling in the lower extremity.  The patient notes the leg continues to be very painful with dependency and swells quite a bite.  Symptoms are much better with elevation.  The patient notes minimal edema in the morning which steadily worsens throughout the day.    The patient has not been using compression therapy at this point.  No SOB or pleuritic chest pains.  No cough or hemoptysis.  No blood per rectum or blood in any sputum.  No excessive bruising per the patient.   Current Meds  Medication Sig  . aspirin EC 81 MG tablet Take by mouth daily.   . Blood Glucose Monitoring Suppl (FIFTY50 GLUCOSE METER 2.0) w/Device KIT Use as directed.  . digoxin (LANOXIN) 0.25 MG tablet Take by mouth.  Arne Cleveland 5 MG TABS tablet Take 5 mg by mouth 2 (two) times daily.   . furosemide (LASIX) 40 MG tablet Take 40 mg by mouth daily.   [name] glimepiride (AMARYL) 4 MG tablet Take 4 mg by mouth daily with breakfast.   . Lancets 30G MISC Use 1 Units as directed. Check CBG's up to twice daily. Dx: E11.9  . losartan (COZAAR) 100 MG tablet take 1 tablet by mouth once daily  . metFORMIN (GLUCOPHAGE) 1000 MG tablet take 1 tablet by mouth twice a day with food  . metoprolol tartrate (LOPRESSOR) 25 MG tablet Take 25 mg by mouth 2 (two) times daily.   . pravastatin (PRAVACHOL) 20 MG tablet take 1 tablet by mouth once daily    Past Medical History:  Diagnosis Date  . A-fib ([place])   . Cancer [facility])    Colon  . CHF (congestive heart failure) ([place])   . Diabetes mellitus without complication ([place])   . Elevated lipids   . Hypertension     Past Surgical History:  Procedure  Laterality Date  . COLON RESECTION    . COLONOSCOPY    . PERIPHERAL VASCULAR CATHETERIZATION Left 05/08/2016   Procedure: Lower Extremity Venography;  Surgeon: Katha Cabal, MD;  Location: [place] [facility];  Service: Cardiovascular;  Laterality: Left;    Social History Social History  Substance Use Topics  . Smoking status: Never Smoker  . Smokeless tobacco: Never Used  . Alcohol use No    Family History Family History  Problem Relation Age of Onset  . Diabetes Mother   . Stroke Mother   No family history of bleeding/clotting disorders, porphyria or autoimmune disease   No Known Allergies   REVIEW OF SYSTEMS (Negative unless checked)  Constitutional: _0 Weight loss  _1 Fever  _2 Chills Cardiac: _3 Chest pain   _4 Chest pressure   _5 Palpitations   _6 Shortness of breath when laying flat   _7 Shortness of breath with exertion. Vascular:  _8 Pain in legs with walking   _9 Pain in legs at rest  _10 History of DVT   _11 Phlebitis   _12 Swelling in legs   _13 Varicose veins   _14 Non-healing ulcers Pulmonary:   _15 Uses home oxygen   _16 Productive cough   _17 Hemoptysis   _18 Wheeze  _19 COPD   _20 Asthma Neurologic:  _21 Dizziness   _22 Seizures   _23 History of stroke   _24   History of TIA  _0 Aphasia   _1 Vissual changes   _2 Weakness or numbness in arm   _3 Weakness or numbness in leg Musculoskeletal:   _4 Joint swelling   _5 Joint pain   _6 Low back pain Hematologic:  _7 Easy bruising  _8 Easy bleeding   _9 Hypercoagulable state   _10 Anemic Gastrointestinal:  _11 Diarrhea   _12 Vomiting  _13 Gastroesophageal reflux/heartburn   _14 Difficulty swallowing. Genitourinary:  _15 Chronic kidney disease   _16 Difficult urination  _17 Frequent urination   _18 Blood in urine Skin:  _19 Rashes   _20 Ulcers  Psychological:  _21 History of anxiety   _22  History of major depression.  Physical Examination  Vitals:   06/07/16 1600  BP: 127/74  Pulse: (!) 53  Resp: 16  Weight: 279 lb 12.8 oz (126.9 kg)   Body mass index is 40.15 kg/m. Gen: WD/WN,  NAD Head: [place]/AT, No temporalis wasting.  Ear/Nose/Throat: Hearing grossly intact, nares w/o erythema or drainage, poor dentition Eyes: PER, EOMI, sclera nonicteric.  Neck: Supple, no masses.  No bruit or JVD.  Pulmonary:  Good air movement, clear to auscultation bilaterally, no use of accessory muscles.  Cardiac: RRR, normal S1, S2, no Murmurs. Vascular:  Minimal edema of the left ankle Vessel Right Left  Radial Palpable Palpable  Ulnar Palpable Palpable  Brachial Palpable Palpable  Carotid Palpable Palpable  Femoral Palpable Palpable  Popliteal Palpable Palpable  PT Palpable Palpable  DP Palpable Palpable   Gastrointestinal: soft, non-distended. No guarding/no peritoneal signs.  Musculoskeletal: M/S 5/5 throughout.  No deformity or atrophy.  Neurologic: CN 2-12 intact. Pain and light touch intact in extremities.  Symmetrical.  Speech is fluent. Motor exam as listed above. Psychiatric: Judgment intact, Mood & affect appropriate for pt's clinical situation. Dermatologic: No rashes or ulcers noted.  No changes consistent with cellulitis. Lymph : No Cervical lymphadenopathy, no lichenification or skin changes of chronic lymphedema.  CBC Lab Results  Component Value Date   WBC 8.4 05/09/2016   HGB 12.5 (L) 05/09/2016   HCT 36.3 (L) 05/09/2016   MCV 91.7 05/09/2016   PLT 181 05/09/2016    BMET    Component Value Date/Time   NA 138 04/19/2016 1407   NA 139 12/17/2012 0608   K 3.9 04/19/2016 1407   K 3.8 12/17/2012 0608   CL 103 04/19/2016 1407   CL 108 (H) 12/17/2012 0608   CO2 25 04/19/2016 1407   CO2 26 12/17/2012 0608   GLUCOSE 105 (H) 04/19/2016 1407   GLUCOSE 217 (H) 12/17/2012 0608   BUN 21 (H) 05/07/2016 1049   BUN 15 12/17/2012 0608   CREATININE 1.01 05/07/2016 1049   CREATININE 0.84 12/17/2012 0608   CALCIUM 9.5 04/19/2016 1407   CALCIUM 8.2 (L) 12/17/2012 0608   GFRNONAA >60 05/07/2016 1049   GFRNONAA >60 12/17/2012 0608   GFRAA >60 05/07/2016 1049   GFRAA  >60 12/17/2012 0608   CrCl cannot be calculated (Patient's most recent lab result is older than the maximum 21 days allowed.).  COAG Lab Results  Component Value Date   INR 1.11 05/08/2016    Radiology No results found.  Assessment/Plan 1. Acute deep vein thrombosis (DVT) of iliac vein of left lower extremity (HCC) Recommend:   No surgery or intervention at this point in time.  IVC filter is present and I will plan to see him back in 3 months to set up removal of the IVC filter.  Patient's duplex ultrasound of the venous system shows DVT from the popliteal to the femoral veins.  The patient is initiated on  anticoagulation   Elevation was stressed, use of a recliner was discussed.  I have had a long discussion with the patient regarding DVT and post phlebitic changes such as swelling and why it  causes symptoms such as pain.  The patient will wear graduated compression stockings class 1 (20-30 mmHg), beginning after three full days of anticoagulation, on a daily basis a prescription was given. The patient will  beginning wearing the stockings first thing in the morning and removing them in the evening. The patient is instructed specifically not to sleep in the stockings.  In addition, behavioral modification including elevation during the day and avoidance of prolonged dependency will be initiated.    The patient will continue anticoagulation for now as there have not been any problems or complications at this point.   - VAS [place] LOWER EXTREMITY VENOUS (DVT); Future  2. Chronic atrial fibrillation (HCC) Continue antiarrhythmia medications as already ordered, these medications have been reviewed and there are no changes at this time.  Continue anticoagulation as ordered by Cardiology Service   3. Type 2 diabetes mellitus without complication, without long-term current use of insulin (HCC) Continue hypoglycemic medications as already ordered, these medications have been reviewed and  there are no changes at this time.  Hgb A1C to be monitored as already arranged by primary service   4. Mixed hyperlipidemia Continue statin as ordered and reviewed, no changes at this time   Hortencia Pilar, MD  06/09/2016 4:21 PM

## 2016-08-16 ENCOUNTER — Ambulatory Visit (INDEPENDENT_AMBULATORY_CARE_PROVIDER_SITE_OTHER): Payer: Medicare Other

## 2016-08-16 ENCOUNTER — Ambulatory Visit (INDEPENDENT_AMBULATORY_CARE_PROVIDER_SITE_OTHER): Payer: Medicare Other | Admitting: Vascular Surgery

## 2016-08-16 VITALS — BP 133/77 | HR 64 | Resp 17 | Ht 70.5 in | Wt 273.0 lb

## 2016-08-16 DIAGNOSIS — E782 Mixed hyperlipidemia: Secondary | ICD-10-CM | POA: Diagnosis not present

## 2016-08-16 DIAGNOSIS — I482 Chronic atrial fibrillation, unspecified: Secondary | ICD-10-CM

## 2016-08-16 DIAGNOSIS — E119 Type 2 diabetes mellitus without complications: Secondary | ICD-10-CM

## 2016-08-16 DIAGNOSIS — I82422 Acute embolism and thrombosis of left iliac vein: Secondary | ICD-10-CM

## 2016-08-17 ENCOUNTER — Encounter (INDEPENDENT_AMBULATORY_CARE_PROVIDER_SITE_OTHER): Payer: Self-pay

## 2016-08-17 NOTE — Progress Notes (Signed)
MRN : [id number]  Donald Townsend is a 66 y.o. (1950/08/04) male who presents with chief complaint of  Chief Complaint  Patient presents with  . Re-evaluation    Ultrasound follow up  .  History of Present Illness: The patient presents to the office for evaluation of DVT.  DVT was identified at [facility] by Duplex ultrasound.  The initial symptoms were pain and swelling in the lower extremity.  On 05/08/2016 he had left lower extremity thrombolysis with placement of an IVC filter.  The patient notes his left leg is much much better, not painful and it doesn't swell much anymore.  The patient notes minimal edema in the morning which worsens slightly throughout the day.    The patient has not been using compression therapy at this point.  No SOB or pleuritic chest pains.  No cough or hemoptysis.  No blood per rectum or blood in any sputum.  No excessive bruising per the patient.   Current Meds  Medication Sig  . aspirin EC 81 MG tablet Take by mouth daily.   . digoxin (LANOXIN) 0.25 MG tablet Take by mouth.  Arne Cleveland 5 MG TABS tablet Take 5 mg by mouth 2 (two) times daily.   . furosemide (LASIX) 40 MG tablet Take 40 mg by mouth daily.   [name] glimepiride (AMARYL) 4 MG tablet Take 4 mg by mouth daily with breakfast.   . Lancets 30G MISC Use 1 Units as directed. Check CBG's up to twice daily. Dx: E11.9  . losartan (COZAAR) 100 MG tablet take 1 tablet by mouth once daily  . metFORMIN (GLUCOPHAGE) 1000 MG tablet take 1 tablet by mouth twice a day with food  . metoprolol tartrate (LOPRESSOR) 25 MG tablet Take 25 mg by mouth 2 (two) times daily.   . pravastatin (PRAVACHOL) 20 MG tablet take 1 tablet by mouth once daily    Past Medical History:  Diagnosis Date  . A-fib ([place])   . Cancer [facility])    Colon  . CHF (congestive heart failure) ([place])   . Diabetes mellitus without complication ([place])   . Elevated lipids   . Hypertension     Past Surgical History:  Procedure Laterality Date    . COLON RESECTION    . COLONOSCOPY    . PERIPHERAL VASCULAR CATHETERIZATION Left 05/08/2016   Procedure: Lower Extremity Venography;  Surgeon: Katha Cabal, MD;  Location: [facility];  Service: Cardiovascular;  Laterality: Left;    Social History Social History  Substance Use Topics  . Smoking status: Never Smoker  . Smokeless tobacco: Never Used  . Alcohol use No    Family History Family History  Problem Relation Age of Onset  . Diabetes Mother   . Stroke Mother     No Known Allergies   REVIEW OF SYSTEMS (Negative unless checked)  Constitutional: '[]'$ Weight loss  '[]'$ Fever  '[]'$ Chills Cardiac: '[]'$ Chest pain   '[]'$ Chest pressure   '[]'$ Palpitations   '[]'$ Shortness of breath when laying flat   '[]'$ Shortness of breath with exertion. Vascular:  '[]'$ Pain in legs with walking   '[]'$ Pain in legs at rest  '[x]'$ History of DVT   '[]'$ Phlebitis   '[x]'$ Swelling in legs   '[]'$ Varicose veins   '[]'$ Non-healing ulcers Pulmonary:   '[]'$ Uses home oxygen   '[]'$ Productive cough   '[]'$ Hemoptysis   '[]'$ Wheeze  '[]'$ COPD   '[]'$ Asthma Neurologic:  '[]'$ Dizziness   '[]'$ Seizures   '[]'$ History of stroke   '[]'$ History of TIA  '[]'$ Aphasia   '[]'$ Vissual changes   '[]'$   Weakness or numbness in arm   '[]'$ Weakness or numbness in leg Musculoskeletal:   '[]'$ Joint swelling   '[x]'$ Joint pain   '[]'$ Low back pain Hematologic:  '[]'$ Easy bruising  '[]'$ Easy bleeding   '[]'$ Hypercoagulable state   '[]'$ Anemic Gastrointestinal:  '[]'$ Diarrhea   '[]'$ Vomiting  '[]'$ Gastroesophageal reflux/heartburn   '[]'$ Difficulty swallowing. Genitourinary:  '[]'$ Chronic kidney disease   '[]'$ Difficult urination  '[]'$ Frequent urination   '[]'$ Blood in urine Skin:  '[]'$ Rashes   '[]'$ Ulcers  Psychological:  '[]'$ History of anxiety   '[]'$  History of major depression.  Physical Examination  Vitals:   08/16/16 1411  BP: 133/77  Pulse: 64  Resp: 17  Weight: 123.8 kg (273 lb)  Height: 5' 10.5" (1.791 m)   Body mass index is 38.62 kg/m. Gen: WD/WN, NAD Head: [place]/AT, No temporalis wasting.  Ear/Nose/Throat: Hearing grossly  intact, nares w/o erythema or drainage Eyes: PER, EOMI, sclera nonicteric.  Neck: Supple, no large masses.   Pulmonary:  Good air movement, no audible wheezing bilaterally, no use of accessory muscles.  Cardiac: RRR, no JVD Vascular:  Left leg with trace edema scattered small varicose veins Vessel Right Left  Radial Palpable Palpable  Ulnar Palpable Palpable  Brachial Palpable Palpable  Carotid Palpable Palpable  Femoral Palpable Palpable  Popliteal Palpable Palpable  PT Palpable Palpable  DP Palpable Palpable  Gastrointestinal: Non-distended. No guarding/no peritoneal signs.  Musculoskeletal: M/S 5/5 throughout.  No deformity or atrophy.  Neurologic: CN 2-12 intact. Symmetrical.  Speech is fluent. Motor exam as listed above. Psychiatric: Judgment intact, Mood & affect appropriate for pt's clinical situation. Dermatologic: No rashes or ulcers noted.  No changes consistent with cellulitis. Lymph : No lichenification or skin changes of chronic lymphedema.  CBC Lab Results  Component Value Date   WBC 8.4 05/09/2016   HGB 12.5 (L) 05/09/2016   HCT 36.3 (L) 05/09/2016   MCV 91.7 05/09/2016   PLT 181 05/09/2016    BMET    Component Value Date/Time   NA 138 04/19/2016 1407   NA 139 12/17/2012 0608   K 3.9 04/19/2016 1407   K 3.8 12/17/2012 0608   CL 103 04/19/2016 1407   CL 108 (H) 12/17/2012 0608   CO2 25 04/19/2016 1407   CO2 26 12/17/2012 0608   GLUCOSE 105 (H) 04/19/2016 1407   GLUCOSE 217 (H) 12/17/2012 0608   BUN 21 (H) 05/07/2016 1049   BUN 15 12/17/2012 0608   CREATININE 1.01 05/07/2016 1049   CREATININE 0.84 12/17/2012 0608   CALCIUM 9.5 04/19/2016 1407   CALCIUM 8.2 (L) 12/17/2012 0608   GFRNONAA >60 05/07/2016 1049   GFRNONAA >60 12/17/2012 0608   GFRAA >60 05/07/2016 1049   GFRAA >60 12/17/2012 0608   CrCl cannot be calculated (Patient's most recent lab result is older than the maximum 21 days allowed.).  COAG Lab Results  Component Value Date   INR  1.11 05/08/2016    Radiology No results found.   Assessment/Plan 1. Acute deep vein thrombosis (DVT) of iliac vein of left lower extremity (HCC) The patient will continue anticoagulation for now as there have not been any problems or complications at this point.  IVC filter should be removed as he has done very well and is tolerating his anticoagulation.  IVC filter placement will be removed. Risk and benefits were reviewed the patient.  Indications for the procedure were reviewed.  All questions were answered, the patient agrees to proceed.   I have reviewed my discussion with the patient regarding DVT and post phlebitic changes such  as swelling and why it  causes symptoms such as pain.  The patient will continue to wear graduated compression stockings class 1 (20-30 mmHg) on a daily basis. The patient will  beginning wearing the stockings first thing in the morning and removing them in the evening. The patient is instructed specifically not to sleep in the stockings.  In addition, behavioral modification including elevation during the day and avoidance of prolonged dependency will be initiated.    The patient will follow-up with me in a month after filter removal  2. Chronic atrial fibrillation (HCC) Continue antiarrhythmia medications as already ordered, these medications have been reviewed and there are no changes at this time.  Continue anticoagulation as ordered by Cardiology Service   3. Type 2 diabetes mellitus without complication, without long-term current use of insulin (HCC) Continue hypoglycemic medications as already ordered, these medications have been reviewed and there are no changes at this time.  Hgb A1C to be monitored as already arranged by primary service   4. Mixed hyperlipidemia Continue statin as ordered and reviewed, no changes at this time    Hortencia Pilar, MD  08/17/2016 12:53 PM

## 2016-08-23 DIAGNOSIS — E7801 Familial hypercholesterolemia: Secondary | ICD-10-CM | POA: Diagnosis not present

## 2016-08-23 DIAGNOSIS — I82422 Acute embolism and thrombosis of left iliac vein: Secondary | ICD-10-CM | POA: Diagnosis not present

## 2016-08-23 DIAGNOSIS — I481 Persistent atrial fibrillation: Secondary | ICD-10-CM | POA: Diagnosis not present

## 2016-08-23 DIAGNOSIS — G4733 Obstructive sleep apnea (adult) (pediatric): Secondary | ICD-10-CM | POA: Diagnosis not present

## 2016-08-23 DIAGNOSIS — I10 Essential (primary) hypertension: Secondary | ICD-10-CM | POA: Diagnosis not present

## 2016-08-27 ENCOUNTER — Other Ambulatory Visit (INDEPENDENT_AMBULATORY_CARE_PROVIDER_SITE_OTHER): Payer: Self-pay | Admitting: Vascular Surgery

## 2016-08-27 MED ORDER — CEFAZOLIN SODIUM-DEXTROSE 1-4 GM/50ML-% IV SOLN: 1.0000 g | Freq: Once | INTRAVENOUS | Status: DC

## 2016-08-28 ENCOUNTER — Encounter
Admission: RE | Disposition: A | Discharge status: Home / Self Care | Payer: Self-pay | Source: Ambulatory Visit | Attending: Vascular Surgery

## 2016-08-28 ENCOUNTER — Ambulatory Visit
Admission: RE | Admit: 2016-08-28 | Discharge: 2016-08-28 | Disposition: A | Discharge status: Home / Self Care | Payer: Medicare Other | Source: Ambulatory Visit | Attending: Vascular Surgery | Admitting: Vascular Surgery

## 2016-08-28 DIAGNOSIS — I509 Heart failure, unspecified: Secondary | ICD-10-CM | POA: Diagnosis not present

## 2016-08-28 DIAGNOSIS — Z823 Family history of stroke: Secondary | ICD-10-CM | POA: Insufficient documentation

## 2016-08-28 DIAGNOSIS — E119 Type 2 diabetes mellitus without complications: Secondary | ICD-10-CM | POA: Insufficient documentation

## 2016-08-28 DIAGNOSIS — Z85038 Personal history of other malignant neoplasm of large intestine: Secondary | ICD-10-CM | POA: Insufficient documentation

## 2016-08-28 DIAGNOSIS — Z833 Family history of diabetes mellitus: Secondary | ICD-10-CM | POA: Insufficient documentation

## 2016-08-28 DIAGNOSIS — E782 Mixed hyperlipidemia: Secondary | ICD-10-CM | POA: Insufficient documentation

## 2016-08-28 DIAGNOSIS — Z9889 Other specified postprocedural states: Secondary | ICD-10-CM | POA: Insufficient documentation

## 2016-08-28 DIAGNOSIS — I482 Chronic atrial fibrillation: Secondary | ICD-10-CM | POA: Insufficient documentation

## 2016-08-28 DIAGNOSIS — I110 Hypertensive heart disease with heart failure: Secondary | ICD-10-CM | POA: Diagnosis not present

## 2016-08-28 DIAGNOSIS — I2699 Other pulmonary embolism without acute cor pulmonale: Secondary | ICD-10-CM | POA: Diagnosis not present

## 2016-08-28 DIAGNOSIS — I82422 Acute embolism and thrombosis of left iliac vein: Secondary | ICD-10-CM | POA: Insufficient documentation

## 2016-08-28 DIAGNOSIS — Z452 Encounter for adjustment and management of vascular access device: Secondary | ICD-10-CM | POA: Insufficient documentation

## 2016-08-28 HISTORY — PX: IVC FILTER REMOVAL: CATH118246

## 2016-08-28 LAB — GLUCOSE, CAPILLARY: GLUCOSE-CAPILLARY: 262 mg/dL — AB (ref 65–99)

## 2016-08-28 SURGERY — IVC FILTER REMOVAL
Anesthesia: Moderate Sedation

## 2016-08-28 MED ORDER — ONDANSETRON HCL 4 MG/2ML IJ SOLN: 4.0000 mg | Freq: Four times a day (QID) | INTRAMUSCULAR | Status: DC | PRN

## 2016-08-28 MED ORDER — MIDAZOLAM HCL 2 MG/2ML IJ SOLN
INTRAMUSCULAR | Status: DC | PRN
  Administered 2016-08-28: 2 mg via INTRAVENOUS

## 2016-08-28 MED ORDER — HEPARIN (PORCINE) IN NACL 2-0.9 UNIT/ML-% IJ SOLN
INTRAMUSCULAR | Status: AC
  Filled 2016-08-28: qty 500

## 2016-08-28 MED ORDER — CEFAZOLIN SODIUM-DEXTROSE 2-4 GM/100ML-% IV SOLN
2.0000 g | Freq: Once | INTRAVENOUS | Status: AC
  Administered 2016-08-28: 2 g via INTRAVENOUS

## 2016-08-28 MED ORDER — MIDAZOLAM HCL 5 MG/5ML IJ SOLN
INTRAMUSCULAR | Status: AC
  Filled 2016-08-28: qty 5

## 2016-08-28 MED ORDER — SODIUM CHLORIDE 0.9 % IV SOLN
INTRAVENOUS | Status: DC
  Administered 2016-08-28: 10:00:00 via INTRAVENOUS

## 2016-08-28 MED ORDER — FAMOTIDINE 20 MG PO TABS: 40.0000 mg | ORAL_TABLET | ORAL | Status: DC | PRN

## 2016-08-28 MED ORDER — CEFAZOLIN SODIUM-DEXTROSE 2-4 GM/100ML-% IV SOLN
INTRAVENOUS | Status: AC
  Filled 2016-08-28: qty 100

## 2016-08-28 MED ORDER — LIDOCAINE HCL (PF) 1 % IJ SOLN
INTRAMUSCULAR | Status: AC
  Filled 2016-08-28: qty 30

## 2016-08-28 MED ORDER — FENTANYL CITRATE (PF) 100 MCG/2ML IJ SOLN
INTRAMUSCULAR | Status: DC | PRN
  Administered 2016-08-28: 50 ug via INTRAVENOUS

## 2016-08-28 MED ORDER — HYDROMORPHONE HCL 1 MG/ML IJ SOLN: 1.0000 mg | Freq: Once | INTRAMUSCULAR | Status: DC | PRN

## 2016-08-28 MED ORDER — FENTANYL CITRATE (PF) 100 MCG/2ML IJ SOLN
INTRAMUSCULAR | Status: AC
  Filled 2016-08-28: qty 2

## 2016-08-28 SURGICAL SUPPLY — 5 items
NEEDLE ENTRY 21GA 7CM ECHOTIP (NEEDLE) ×3 IMPLANT
PACK ANGIOGRAPHY (CUSTOM PROCEDURE TRAY) ×3 IMPLANT
SET INTRO CAPELLA COAXIAL (SET/KITS/TRAYS/PACK) ×3 IMPLANT
SET VENACAVA FILTER RETRIEVAL (MISCELLANEOUS) ×3 IMPLANT
WIRE J 3MM .035X145CM (WIRE) ×3 IMPLANT

## 2016-08-28 NOTE — H&P (Signed)
[facility] History & Physical Update  The patient was interviewed and re-examined.  The patient's previous History and Physical has been reviewed and is unchanged.  There is no change in the plan of care. We plan to proceed with the scheduled procedure.  Hortencia Pilar, MD  08/28/2016, 10:21 AM

## 2016-08-28 NOTE — Op Note (Signed)
  OPERATIVE NOTE   PRE-OPERATIVE DIAGNOSIS: DVT with PE  POST-OPERATIVE DIAGNOSIS: Same  PROCEDURE: 1. Retrieval of IVC Filter 2. Inferior Vena Cavagram  SURGEON: Katha Cabal, M.D.  ANESTHESIA:  Conscious sedation was administered under my direct supervision by the interventional radiology RN. IV Versed plus fentanyl were utilized. Continuous ECG, pulse oximetry and blood pressure was monitored throughout the entire procedure. Conscious sedation was for a total of 25 minutes.  ESTIMATED BLOOD LOSS: Minimal cc  FINDING(S):inferior vena cava is widely patent filter is in place in good position. Filter is removed without incident  SPECIMEN(S):  IVC filter intact  INDICATIONS:   Donald Townsend is a 66 y.o. male who presents with DVT and PE. The patient has now tolerated anticoagulation for several months. Therefore, the IVC filter is recommended to be removed. The risks and benefits were reviewed with the patient all questions were answered and they agreed to proceed with IVC filter retrieval. Oral anticoagulation will be continued.  DESCRIPTION: After obtaining full informed written consent, the patient was brought back to the Special Procedure Suite and placed in the supine position.  The patient received IV antibiotics prior to induction.  After obtaining adequate sedation, the patient was prepped and draped in the standard fashion and appropriate time out is called.     Ultrasound was placed in a sterile sleeve.The right neck was then imaged with ultrasound.   Jugular vein was identified it is echolucent and homogeneous indicating patency. 1% lidocaine is then infiltrated under ultrasound visualization and subsequently a Seldinger needle is inserted under real-time ultrasound guidance.  J-wire is then advanced into the inferior vena cava under fluoroscopic guidance. With the tip of the sheath at the confluence of the iliac veins inferior vena caval imaging is performed.  After  review of the image the sheath is repositioned to above the filter and the snares introduced. Snares opened and the hook is secured without difficulty. The filter is then collapsed within the sheath and removed without difficulty.  Sheath is removed by pressures held the patient tolerated the procedure well and there were no immediate complications.  Interpretation: inferior vena cava is widely patent filter is in place in good position. Filter is removed without incident.     COMPLICATIONS: None  CONDITION: [name], M.D. [facility] Office: [phone]   08/28/2016, 11:27 AM

## 2016-08-29 ENCOUNTER — Encounter: Payer: Self-pay | Admitting: Vascular Surgery

## 2016-09-05 ENCOUNTER — Encounter (INDEPENDENT_AMBULATORY_CARE_PROVIDER_SITE_OTHER): Payer: Self-pay | Admitting: Vascular Surgery

## 2016-09-05 ENCOUNTER — Ambulatory Visit (INDEPENDENT_AMBULATORY_CARE_PROVIDER_SITE_OTHER): Payer: Medicare Other | Admitting: Vascular Surgery

## 2016-09-05 VITALS — BP 167/87 | HR 74 | Resp 16 | Ht 70.0 in | Wt 274.0 lb

## 2016-09-05 DIAGNOSIS — I82422 Acute embolism and thrombosis of left iliac vein: Secondary | ICD-10-CM | POA: Diagnosis not present

## 2016-09-05 DIAGNOSIS — E782 Mixed hyperlipidemia: Secondary | ICD-10-CM | POA: Diagnosis not present

## 2016-09-05 DIAGNOSIS — E119 Type 2 diabetes mellitus without complications: Secondary | ICD-10-CM

## 2016-09-05 NOTE — Progress Notes (Signed)
Subjective:    Patient ID: Donald Townsend, male    DOB: Nov 18, 1950, 66 y.o.   MRN: [id number] Chief Complaint  Patient presents with  . Re-evaluation    1 week [facility] follow up   Patient presents for his first post-procedure follow up. He is s/p a IVC filter removal on 08/28/16. His post-procedure course has been uneventful. Neck incision has healed well. He is without complaint. Denies any fever, nausea or vomiting.    Review of Systems  Constitutional: Negative.   HENT: Negative.   Eyes: Negative.   Respiratory: Negative.   Cardiovascular: Negative.   Gastrointestinal: Negative.   Endocrine: Negative.   Genitourinary: Negative.   Musculoskeletal: Negative.   Skin: Negative.   Allergic/Immunologic: Negative.   Neurological: Negative.   Hematological: Negative.   Psychiatric/Behavioral: Negative.       Objective:   Physical Exam  Constitutional: He is oriented to person, place, and time. He appears well-developed and well-nourished. No distress.  HENT:  Head: Normocephalic and atraumatic.  Eyes: Conjunctivae are normal. Pupils are equal, round, and reactive to light.  Neck: Normal range of motion.  Cardiovascular: Normal rate, regular rhythm and normal heart sounds.   Pulses:      Radial pulses are 2+ on the right side, and 2+ on the left side.  Pulmonary/Chest: Effort normal.  Musculoskeletal: Normal range of motion. He exhibits no edema.  Neurological: He is alert and oriented to person, place, and time.  Skin: Skin is warm and dry. He is not diaphoretic.  Psychiatric: He has a normal mood and affect. His behavior is normal. Judgment and thought content normal.  Vitals reviewed.  BP (!) 167/87 (BP Location: Right Arm)   Pulse 74   Resp 16   Ht '5\' 10"'$  (1.778 m)   Wt 274 lb (124.3 kg)   BMI 39.31 kg/m   Past Medical History:  Diagnosis Date  . A-fib ([place])   . Cancer [facility])    Colon  . CHF (congestive heart failure) ([place])   . Diabetes mellitus without  complication ([place])   . Elevated lipids   . Hypertension    Social History   Social History  . Marital status: Divorced    Spouse name: N/A  . Number of children: N/A  . Years of education: N/A   Occupational History  . Not on file.   Social History Main Topics  . Smoking status: Never Smoker  . Smokeless tobacco: Never Used  . Alcohol use No  . Drug use: No  . Sexual activity: Not on file   Other Topics Concern  . Not on file   Social History Narrative  . No narrative on file   Past Surgical History:  Procedure Laterality Date  . COLON RESECTION    . COLONOSCOPY    . IVC FILTER REMOVAL N/A 08/28/2016   Procedure: IVC Filter Removal;  Surgeon: Katha Cabal, MD;  Location: [place] [facility];  Service: Cardiovascular;  Laterality: N/A;  . PERIPHERAL VASCULAR CATHETERIZATION Left 05/08/2016   Procedure: Lower Extremity Venography;  Surgeon: Katha Cabal, MD;  Location: [place] [facility];  Service: Cardiovascular;  Laterality: Left;   Family History  Problem Relation Age of Onset  . Diabetes Mother   . Stroke Mother    No Known Allergies     Assessment & Plan:  Patient to remain abstinent of tobacco use. I have discussed with the patient at length the risk factors for and pathogenesis of atherosclerotic  disease and encouraged a healthy diet, regular exercise regimen and blood pressure / glucose control.  Patient was instructed to contact our office in the interim with problems such as increasing / uncontrollable hypertension or changes in kidney function. The patient expresses their understanding.  1. Acute deep vein thrombosis (DVT) of iliac vein of left lower extremity (HCC) s/p IVC Filter removal on 08/28/16 - Stable Patient without complaint. Post-procedure course has been uneventful. Neck incision is healed. Patient to follow up PRN  2. Type 2 diabetes mellitus without complication, without long-term current use of insulin (HCC) - Stable Encouraged  good control as its slows the progression of atherosclerotic disease  3. Mixed hyperlipidemia - Stable Encouraged good control as its slows the progression of atherosclerotic disease  Current Outpatient Prescriptions on File Prior to Visit  Medication Sig Dispense Refill  . aspirin EC 81 MG tablet Take 81 mg by mouth daily.     . digoxin (LANOXIN) 0.25 MG tablet Take 0.25 mg by mouth daily.     [name] ELIQUIS 5 MG TABS tablet Take 5 mg by mouth 2 (two) times daily.     . furosemide (LASIX) 40 MG tablet Take 40 mg by mouth daily.     [name] glimepiride (AMARYL) 4 MG tablet Take 4 mg by mouth daily with breakfast.   0  . Lancets 30G MISC Use 1 Units as directed. Check CBG's up to twice daily. Dx: E11.9    . losartan (COZAAR) 100 MG tablet take '100mg'$  by mouth once daily    . metFORMIN (GLUCOPHAGE) 1000 MG tablet take '1000mg'$ s by mouth twice daily with food    . metoprolol tartrate (LOPRESSOR) 25 MG tablet Take 25 mg by mouth 2 (two) times daily.     . pravastatin (PRAVACHOL) 20 MG tablet take '20mg'$ s by mouth daily at bedtime     No current facility-administered medications on file prior to visit.     There are no Patient Instructions on file for this visit. No Follow-up on file.   Mairead Schwarzkopf A Melek Pownall, PA-C

## 2016-12-07 ENCOUNTER — Other Ambulatory Visit: Payer: Self-pay | Admitting: Family Medicine

## 2016-12-07 ENCOUNTER — Ambulatory Visit
Admission: RE | Admit: 2016-12-07 | Discharge: 2016-12-07 | Disposition: A | Discharge status: Home / Self Care | Payer: Medicare Other | Source: Ambulatory Visit | Attending: Family Medicine | Admitting: Family Medicine

## 2016-12-07 DIAGNOSIS — I82422 Acute embolism and thrombosis of left iliac vein: Secondary | ICD-10-CM | POA: Diagnosis not present

## 2016-12-07 DIAGNOSIS — M79605 Pain in left leg: Secondary | ICD-10-CM

## 2016-12-07 DIAGNOSIS — Z86718 Personal history of other venous thrombosis and embolism: Secondary | ICD-10-CM | POA: Diagnosis not present

## 2016-12-07 DIAGNOSIS — M25552 Pain in left hip: Secondary | ICD-10-CM | POA: Diagnosis not present

## 2016-12-07 DIAGNOSIS — R3914 Feeling of incomplete bladder emptying: Secondary | ICD-10-CM | POA: Diagnosis not present

## 2016-12-07 DIAGNOSIS — I824Z2 Acute embolism and thrombosis of unspecified deep veins of left distal lower extremity: Secondary | ICD-10-CM | POA: Diagnosis not present

## 2016-12-07 DIAGNOSIS — I10 Essential (primary) hypertension: Secondary | ICD-10-CM | POA: Diagnosis not present

## 2016-12-07 DIAGNOSIS — N401 Enlarged prostate with lower urinary tract symptoms: Secondary | ICD-10-CM | POA: Diagnosis not present

## 2016-12-07 DIAGNOSIS — E7800 Pure hypercholesterolemia, unspecified: Secondary | ICD-10-CM | POA: Diagnosis not present

## 2016-12-07 DIAGNOSIS — E119 Type 2 diabetes mellitus without complications: Secondary | ICD-10-CM | POA: Diagnosis not present

## 2016-12-10 ENCOUNTER — Ambulatory Visit (INDEPENDENT_AMBULATORY_CARE_PROVIDER_SITE_OTHER): Payer: Medicare Other | Admitting: Vascular Surgery

## 2016-12-10 ENCOUNTER — Encounter (INDEPENDENT_AMBULATORY_CARE_PROVIDER_SITE_OTHER): Payer: Self-pay

## 2016-12-10 ENCOUNTER — Other Ambulatory Visit (INDEPENDENT_AMBULATORY_CARE_PROVIDER_SITE_OTHER): Payer: Self-pay

## 2016-12-10 ENCOUNTER — Encounter (INDEPENDENT_AMBULATORY_CARE_PROVIDER_SITE_OTHER): Payer: Self-pay | Admitting: Vascular Surgery

## 2016-12-10 VITALS — BP 154/82 | HR 87 | Resp 16 | Wt 266.0 lb

## 2016-12-10 DIAGNOSIS — E782 Mixed hyperlipidemia: Secondary | ICD-10-CM

## 2016-12-10 DIAGNOSIS — M79605 Pain in left leg: Secondary | ICD-10-CM | POA: Diagnosis not present

## 2016-12-10 DIAGNOSIS — I871 Compression of vein: Secondary | ICD-10-CM

## 2016-12-10 DIAGNOSIS — E119 Type 2 diabetes mellitus without complications: Secondary | ICD-10-CM

## 2016-12-10 DIAGNOSIS — I482 Chronic atrial fibrillation, unspecified: Secondary | ICD-10-CM

## 2016-12-11 DIAGNOSIS — E119 Type 2 diabetes mellitus without complications: Secondary | ICD-10-CM | POA: Diagnosis not present

## 2016-12-12 DIAGNOSIS — M79605 Pain in left leg: Secondary | ICD-10-CM | POA: Insufficient documentation

## 2016-12-12 DIAGNOSIS — R52 Pain, unspecified: Secondary | ICD-10-CM | POA: Insufficient documentation

## 2016-12-12 DIAGNOSIS — I871 Compression of vein: Secondary | ICD-10-CM | POA: Insufficient documentation

## 2016-12-12 NOTE — Progress Notes (Signed)
MRN : [id number]  Donald Townsend is a 66 y.o. (02-12-1951) male who presents with chief complaint of  Chief Complaint  Patient presents with  . Leg Swelling  .  History of Present Illness: The patient returns to the office for followup evaluation regarding leg swelling.  He has a history of left leg DVT and underwent thrombolysis several months ago. This was successful and he states his left leg returned to to a normal size. Over the past month or so the swelling has returned and the pain associated with swelling is back. There have not been any interval development of a ulcerations or wounds.  He is concerned that he is reclotted his leg. He is been maintained on anticoagulation without interruption.  Since the previous visit the patient has not been wearing graduated compression stockings.   The patient also states elevation during the day and exercise is being done too.   Current Meds  Medication Sig  . aspirin EC 81 MG tablet Take 81 mg by mouth daily.   . digoxin (LANOXIN) 0.25 MG tablet Take 0.25 mg by mouth daily.   [name] ELIQUIS 5 MG TABS tablet Take 5 mg by mouth 2 (two) times daily.   . furosemide (LASIX) 40 MG tablet Take 40 mg by mouth daily.   [name] glimepiride (AMARYL) 4 MG tablet Take 4 mg by mouth daily with breakfast.   . Lancets 30G MISC Use 1 Units as directed. Check CBG's up to twice daily. Dx: E11.9  . losartan (COZAAR) 100 MG tablet take 100mg  by mouth once daily  . metFORMIN (GLUCOPHAGE) 1000 MG tablet take 1000mg s by mouth twice daily with food  . metoprolol tartrate (LOPRESSOR) 25 MG tablet Take 25 mg by mouth 2 (two) times daily.   . pravastatin (PRAVACHOL) 20 MG tablet take 20mg s by mouth daily at bedtime    Past Medical History:  Diagnosis Date  . A-fib ([place])   . Cancer [facility])    Colon  . CHF (congestive heart failure) ([place])   . Diabetes mellitus without complication ([place])   . Elevated lipids   . Hypertension     Past Surgical History:  Procedure  Laterality Date  . COLON RESECTION    . COLONOSCOPY    . IVC FILTER REMOVAL N/A 08/28/2016   Procedure: IVC Filter Removal;  Surgeon: Katha Cabal, MD;  Location: [place] [facility];  Service: Cardiovascular;  Laterality: N/A;  . PERIPHERAL VASCULAR CATHETERIZATION Left 05/08/2016   Procedure: Lower Extremity Venography;  Surgeon: Katha Cabal, MD;  Location: [place] [facility];  Service: Cardiovascular;  Laterality: Left;    Social History Social History  Substance Use Topics  . Smoking status: Never Smoker  . Smokeless tobacco: Never Used  . Alcohol use No    Family History Family History  Problem Relation Age of Onset  . Diabetes Mother   . Stroke Mother     No Known Allergies   REVIEW OF SYSTEMS (Negative unless checked)  Constitutional: [] Weight loss  [] Fever  [] Chills Cardiac: [] Chest pain   [] Chest pressure   [] Palpitations   [] Shortness of breath when laying flat   [] Shortness of breath with exertion. Vascular:  [x] Pain in legs with walking   [x] Pain in legs at rest  [x] History of DVT   [] Phlebitis   [x] Swelling in legs   [] Varicose veins   [] Non-healing ulcers Pulmonary:   [] Uses home oxygen   [] Productive cough   [] Hemoptysis   [] Wheeze  [] COPD   []   Asthma Neurologic:  [] Dizziness   [] Seizures   [] History of stroke   [] History of TIA  [] Aphasia   [] Vissual changes   [] Weakness or numbness in arm   [] Weakness or numbness in leg Musculoskeletal:   [] Joint swelling   [] Joint pain   [] Low back pain Hematologic:  [] Easy bruising  [] Easy bleeding   [] Hypercoagulable state   [] Anemic Gastrointestinal:  [] Diarrhea   [] Vomiting  [] Gastroesophageal reflux/heartburn   [] Difficulty swallowing. Genitourinary:  [] Chronic kidney disease   [] Difficult urination  [] Frequent urination   [] Blood in urine Skin:  [] Rashes   [] Ulcers  Psychological:  [] History of anxiety   []  History of major depression.  Physical Examination  Vitals:   12/10/16 1337  BP: (!) 154/82  Pulse:  87  Resp: 16  Weight: 266 lb (120.7 kg)   Body mass index is 38.17 kg/m. Gen: WD/WN, NAD Head: [place]/AT, No temporalis wasting.  Ear/Nose/Throat: Hearing grossly intact, nares w/o erythema or drainage Eyes: PER, EOMI, sclera nonicteric.  Neck: Supple, no large masses.   Pulmonary:  Good air movement, no audible wheezing bilaterally, no use of accessory muscles.  Cardiac: RRR, no JVD Vascular: 3+ edema left lower extremity Vessel Right Left  Radial Palpable Palpable  PT Palpable Palpable  DP Palpable Palpable  Gastrointestinal: Non-distended. No guarding/no peritoneal signs.  Musculoskeletal: M/S 5/5 throughout.  No deformity or atrophy.  Neurologic: CN 2-12 intact. Symmetrical.  Speech is fluent. Motor exam as listed above. Psychiatric: Judgment intact, Mood & affect appropriate for pt's clinical situation. Dermatologic: Mild venous rashes no ulcers noted.  No changes consistent with cellulitis. Lymph : No lichenification or skin changes of chronic lymphedema.  CBC Lab Results  Component Value Date   WBC 8.4 05/09/2016   HGB 12.5 (L) 05/09/2016   HCT 36.3 (L) 05/09/2016   MCV 91.7 05/09/2016   PLT 181 05/09/2016    BMET    Component Value Date/Time   NA 138 04/19/2016 1407   NA 139 12/17/2012 0608   K 3.9 04/19/2016 1407   K 3.8 12/17/2012 0608   CL 103 04/19/2016 1407   CL 108 (H) 12/17/2012 0608   CO2 25 04/19/2016 1407   CO2 26 12/17/2012 0608   GLUCOSE 105 (H) 04/19/2016 1407   GLUCOSE 217 (H) 12/17/2012 0608   BUN 21 (H) 05/07/2016 1049   BUN 15 12/17/2012 0608   CREATININE 1.01 05/07/2016 1049   CREATININE 0.84 12/17/2012 0608   CALCIUM 9.5 04/19/2016 1407   CALCIUM 8.2 (L) 12/17/2012 0608   GFRNONAA >60 05/07/2016 1049   GFRNONAA >60 12/17/2012 0608   GFRAA >60 05/07/2016 1049   GFRAA >60 12/17/2012 0608   CrCl cannot be calculated (Patient's most recent lab result is older than the maximum 21 days allowed.).  COAG Lab Results  Component Value Date     INR 1.11 05/08/2016    Radiology US Venous Img Lower Unilateral Left  Result Date: 12/07/2016 CLINICAL DATA:  Acute probable embolism of the LEFT iliac vein, LEFT leg pain for 2 weeks EXAM: LEFT LOWER EXTREMITY VENOUS DOPPLER ULTRASOUND TECHNIQUE: Gray-scale sonography with graded compression, as well as color Doppler and duplex ultrasound were performed to evaluate the lower extremity deep venous systems from the level of the common femoral vein and including the common femoral, femoral, profunda femoral, popliteal and calf veins including the posterior tibial, peroneal and gastrocnemius veins when visible. The superficial great saphenous vein was also interrogated. Spectral Doppler was utilized to evaluate flow at rest and with  distal augmentation maneuvers in the common femoral, femoral and popliteal veins. COMPARISON:  04/19/2016 FINDINGS: Contralateral Common Femoral Vein: Respiratory phasicity is normal and symmetric with the symptomatic side. No evidence of thrombus. Normal compressibility. Common Femoral Vein: No evidence of thrombus. Normal compressibility, respiratory phasicity and response to augmentation. Saphenofemoral Junction: No evidence of thrombus. Normal compressibility and flow on color Doppler imaging. Profunda Femoral Vein: No evidence of thrombus. Normal compressibility and flow on color Doppler imaging. Femoral Vein: No evidence of thrombus. Normal compressibility, respiratory phasicity and response to augmentation. Popliteal Vein: No evidence of thrombus. Normal compressibility, respiratory phasicity and response to augmentation. Calf Veins: No evidence of thrombus. Normal compressibility and flow on color Doppler imaging. Superficial Great Saphenous Vein: No evidence of thrombus. Normal compressibility and flow on color Doppler imaging. Venous Reflux:  None. Other Findings: Visualized portion of the LEFT external iliac vein is patent. More cephalad portions of the LEFT iliac vein  are obscured by intrapelvic structures. LEFT inguinal lymph nodes identified containing fatty hila, measuring up to 1.3 cm short axis. IMPRESSION: No evidence of DVT within the LEFT lower extremity. Electronically Signed   By: Lavonia Dana M.D.   On: 12/07/2016 11:14    Assessment/Plan 1. 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 I have personally reviewed the CT scan and agree with this diagnosis. He has profound compression of the left iliac vein. Neither the ultrasound with a CT scan substantiates a large amount of deep venous thrombosis. Given that he is severely symptomatic and is now having significant lifestyle limitations he should undergo angiography with the hope for intervention for relief and treatment of his venous stricture  Patient should undergo angiography of the lower extremities with the hope for intervention for treatment of his May Thurner syndrome.  The risks and benefits as well as the alternative therapies was discussed in detail with the patient.  All questions were answered.  Patient agrees to proceed with angiography.  The patient will follow up with me in the office after the procedure.     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 Chronic atrial fibrillation (HCC) Continue antiarrhythmia medications as already ordered, these medications have been reviewed and there are no changes at this time.  Continue anticoagulation as ordered by Cardiology Service   4. Mixed hyperlipidemia Continue statin as ordered and reviewed, no changes at this time   5. Type 2 diabetes mellitus without complication, without long-term current use of insulin (HCC) Continue hypoglycemic medications as already ordered, these medications have been reviewed and there are no changes at this time.  Hgb A1C to be monitored as already arranged  by primary service     Hortencia Pilar, MD  12/12/2016 9:25 AM

## 2016-12-17 ENCOUNTER — Encounter (INDEPENDENT_AMBULATORY_CARE_PROVIDER_SITE_OTHER): Payer: Self-pay

## 2016-12-17 ENCOUNTER — Encounter
Admission: RE | Admit: 2016-12-17 | Discharge: 2016-12-17 | Disposition: A | Discharge status: Home / Self Care | Payer: Medicare Other | Source: Ambulatory Visit | Attending: Vascular Surgery | Admitting: Vascular Surgery

## 2016-12-17 DIAGNOSIS — E782 Mixed hyperlipidemia: Secondary | ICD-10-CM | POA: Diagnosis not present

## 2016-12-17 DIAGNOSIS — Z823 Family history of stroke: Secondary | ICD-10-CM | POA: Diagnosis not present

## 2016-12-17 DIAGNOSIS — Z7901 Long term (current) use of anticoagulants: Secondary | ICD-10-CM | POA: Diagnosis not present

## 2016-12-17 DIAGNOSIS — Z86718 Personal history of other venous thrombosis and embolism: Secondary | ICD-10-CM | POA: Diagnosis not present

## 2016-12-17 DIAGNOSIS — Z85038 Personal history of other malignant neoplasm of large intestine: Secondary | ICD-10-CM | POA: Diagnosis not present

## 2016-12-17 DIAGNOSIS — Z7984 Long term (current) use of oral hypoglycemic drugs: Secondary | ICD-10-CM | POA: Diagnosis not present

## 2016-12-17 DIAGNOSIS — I110 Hypertensive heart disease with heart failure: Secondary | ICD-10-CM | POA: Diagnosis not present

## 2016-12-17 DIAGNOSIS — I482 Chronic atrial fibrillation: Secondary | ICD-10-CM | POA: Diagnosis not present

## 2016-12-17 DIAGNOSIS — I871 Compression of vein: Secondary | ICD-10-CM | POA: Diagnosis not present

## 2016-12-17 DIAGNOSIS — Z7982 Long term (current) use of aspirin: Secondary | ICD-10-CM | POA: Diagnosis not present

## 2016-12-17 DIAGNOSIS — E119 Type 2 diabetes mellitus without complications: Secondary | ICD-10-CM | POA: Diagnosis not present

## 2016-12-17 DIAGNOSIS — Z833 Family history of diabetes mellitus: Secondary | ICD-10-CM | POA: Diagnosis not present

## 2016-12-17 DIAGNOSIS — I509 Heart failure, unspecified: Secondary | ICD-10-CM | POA: Diagnosis not present

## 2016-12-17 HISTORY — DX: Dyspnea, unspecified: R06.00

## 2016-12-17 HISTORY — DX: Peripheral vascular disease, unspecified: I73.9

## 2016-12-17 LAB — BASIC METABOLIC PANEL
ANION GAP: 10 (ref 5–15)
BUN: 22 mg/dL — ABNORMAL HIGH (ref 6–20)
CHLORIDE: 102 mmol/L (ref 101–111)
CO2: 28 mmol/L (ref 22–32)
Calcium: 9.8 mg/dL (ref 8.9–10.3)
Creatinine, Ser: 1.39 mg/dL — ABNORMAL HIGH (ref 0.61–1.24)
GFR calc Af Amer: 60 mL/min — ABNORMAL LOW (ref >60)
GFR calc non Af Amer: 52 mL/min — ABNORMAL LOW (ref >60)
Glucose, Bld: 172 mg/dL — ABNORMAL HIGH (ref 65–99)
POTASSIUM: 4.4 mmol/L (ref 3.5–5.1)
Sodium: 140 mmol/L (ref 135–145)

## 2016-12-17 MED ORDER — CEFAZOLIN SODIUM-DEXTROSE 2-4 GM/100ML-% IV SOLN
2.0000 g | Freq: Once | INTRAVENOUS | Status: DC
  Administered 2016-12-18: 2 g via INTRAVENOUS

## 2016-12-18 ENCOUNTER — Ambulatory Visit
Admission: RE | Admit: 2016-12-18 | Discharge: 2016-12-18 | Disposition: A | Discharge status: Home / Self Care | Payer: Medicare Other | Source: Ambulatory Visit | Attending: Vascular Surgery | Admitting: Vascular Surgery

## 2016-12-18 ENCOUNTER — Encounter: Payer: Self-pay | Admitting: *Deleted

## 2016-12-18 ENCOUNTER — Ambulatory Visit: Payer: Medicare Other

## 2016-12-18 ENCOUNTER — Encounter
Admission: RE | Disposition: A | Discharge status: Home / Self Care | Payer: Self-pay | Source: Ambulatory Visit | Attending: Vascular Surgery

## 2016-12-18 DIAGNOSIS — I509 Heart failure, unspecified: Secondary | ICD-10-CM | POA: Diagnosis not present

## 2016-12-18 DIAGNOSIS — Z85038 Personal history of other malignant neoplasm of large intestine: Secondary | ICD-10-CM | POA: Insufficient documentation

## 2016-12-18 DIAGNOSIS — Z833 Family history of diabetes mellitus: Secondary | ICD-10-CM | POA: Insufficient documentation

## 2016-12-18 DIAGNOSIS — Z823 Family history of stroke: Secondary | ICD-10-CM | POA: Insufficient documentation

## 2016-12-18 DIAGNOSIS — Z7982 Long term (current) use of aspirin: Secondary | ICD-10-CM | POA: Insufficient documentation

## 2016-12-18 DIAGNOSIS — I871 Compression of vein: Secondary | ICD-10-CM | POA: Insufficient documentation

## 2016-12-18 DIAGNOSIS — E782 Mixed hyperlipidemia: Secondary | ICD-10-CM | POA: Diagnosis not present

## 2016-12-18 DIAGNOSIS — I110 Hypertensive heart disease with heart failure: Secondary | ICD-10-CM | POA: Diagnosis not present

## 2016-12-18 DIAGNOSIS — I82412 Acute embolism and thrombosis of left femoral vein: Secondary | ICD-10-CM | POA: Diagnosis not present

## 2016-12-18 DIAGNOSIS — I482 Chronic atrial fibrillation: Secondary | ICD-10-CM | POA: Insufficient documentation

## 2016-12-18 DIAGNOSIS — E119 Type 2 diabetes mellitus without complications: Secondary | ICD-10-CM | POA: Diagnosis not present

## 2016-12-18 DIAGNOSIS — Z7901 Long term (current) use of anticoagulants: Secondary | ICD-10-CM | POA: Insufficient documentation

## 2016-12-18 DIAGNOSIS — I8290 Acute embolism and thrombosis of unspecified vein: Secondary | ICD-10-CM

## 2016-12-18 DIAGNOSIS — Z86718 Personal history of other venous thrombosis and embolism: Secondary | ICD-10-CM | POA: Insufficient documentation

## 2016-12-18 DIAGNOSIS — I82419 Acute embolism and thrombosis of unspecified femoral vein: Secondary | ICD-10-CM

## 2016-12-18 DIAGNOSIS — Z7984 Long term (current) use of oral hypoglycemic drugs: Secondary | ICD-10-CM | POA: Insufficient documentation

## 2016-12-18 HISTORY — PX: LOWER EXTREMITY VENOGRAPHY: CATH118253

## 2016-12-18 LAB — GLUCOSE, CAPILLARY
GLUCOSE-CAPILLARY: 60 mg/dL — AB (ref 65–99)
GLUCOSE-CAPILLARY: 168 mg/dL — AB (ref 65–99)
GLUCOSE-CAPILLARY: 132 mg/dL — AB (ref 65–99)

## 2016-12-18 SURGERY — LOWER EXTREMITY VENOGRAPHY
Anesthesia: Moderate Sedation | Laterality: Left

## 2016-12-18 MED ORDER — MIDAZOLAM HCL 5 MG/5ML IJ SOLN
INTRAMUSCULAR | Status: AC
  Filled 2016-12-18: qty 5

## 2016-12-18 MED ORDER — HEPARIN SODIUM (PORCINE) 1000 UNIT/ML IJ SOLN
INTRAMUSCULAR | Status: DC | PRN
Start: 2016-12-18 — End: 2016-12-18
  Administered 2016-12-18: 3000 [IU] via INTRAVENOUS

## 2016-12-18 MED ORDER — DEXTROSE 50 % IV SOLN
INTRAVENOUS | Status: AC
  Filled 2016-12-18: qty 50

## 2016-12-18 MED ORDER — FENTANYL CITRATE (PF) 100 MCG/2ML IJ SOLN
INTRAMUSCULAR | Status: DC | PRN
  Administered 2016-12-18: 50 ug via INTRAVENOUS
  Administered 2016-12-18 (×2): 25 ug via INTRAVENOUS
  Administered 2016-12-18 (×2): 50 ug via INTRAVENOUS

## 2016-12-18 MED ORDER — DIPHENHYDRAMINE HCL 50 MG/ML IJ SOLN
INTRAMUSCULAR | Status: AC
  Filled 2016-12-18: qty 1

## 2016-12-18 MED ORDER — LIDOCAINE HCL (PF) 1 % IJ SOLN
INTRAMUSCULAR | Status: AC
  Filled 2016-12-18: qty 30

## 2016-12-18 MED ORDER — FENTANYL CITRATE (PF) 100 MCG/2ML IJ SOLN
INTRAMUSCULAR | Status: AC
  Filled 2016-12-18: qty 2

## 2016-12-18 MED ORDER — IOPAMIDOL (ISOVUE-300) INJECTION 61%
INTRAVENOUS | Status: DC | PRN
  Administered 2016-12-18: 35 mL via INTRAVENOUS

## 2016-12-18 MED ORDER — MIDAZOLAM HCL 2 MG/2ML IJ SOLN
INTRAMUSCULAR | Status: DC | PRN
  Administered 2016-12-18: 2 mg via INTRAVENOUS
  Administered 2016-12-18 (×3): 1 mg via INTRAVENOUS

## 2016-12-18 MED ORDER — DIPHENHYDRAMINE HCL 50 MG/ML IJ SOLN
INTRAMUSCULAR | Status: DC | PRN
  Administered 2016-12-18: 50 mg via INTRAVENOUS

## 2016-12-18 MED ORDER — HEPARIN SODIUM (PORCINE) 1000 UNIT/ML IJ SOLN
INTRAMUSCULAR | Status: AC
  Filled 2016-12-18: qty 1

## 2016-12-18 MED ORDER — DEXTROSE 50 % IV SOLN
25.0000 mL | Freq: Once | INTRAVENOUS | Status: AC
  Administered 2016-12-18: 25 mL via INTRAVENOUS

## 2016-12-18 MED ORDER — HEPARIN (PORCINE) IN NACL 2-0.9 UNIT/ML-% IJ SOLN
INTRAMUSCULAR | Status: AC
  Filled 2016-12-18: qty 1000

## 2016-12-18 MED ORDER — MIDAZOLAM HCL 2 MG/2ML IJ SOLN
INTRAMUSCULAR | Status: AC
  Filled 2016-12-18: qty 4

## 2016-12-18 MED ORDER — SODIUM CHLORIDE 0.9 % IV SOLN: INTRAVENOUS | Status: DC

## 2016-12-18 SURGICAL SUPPLY — 19 items
BALLN ATLAS 14X40X75 (BALLOONS) ×3
BALLN DORADO 9X80X80 (BALLOONS) ×3
BALLN DORADO7X100X80 (BALLOONS) ×3
BALLN LUTONIX AV 10X60X75 (BALLOONS) ×3
BALLN LUTONIX AV 12X40X75 (BALLOONS) ×6
BALLOON ATLAS 14X40X75 (BALLOONS) ×1 IMPLANT
BALLOON DORADO 9X80X80 (BALLOONS) ×1 IMPLANT
BALLOON DORADO7X100X80 (BALLOONS) ×1 IMPLANT
BALLOON LUTONIX AV 10X60X75 (BALLOONS) ×1 IMPLANT
BALLOON LUTONIX AV 12X40X75 (BALLOONS) ×2 IMPLANT
COVER PROBE U/S 5X48 (MISCELLANEOUS) ×9 IMPLANT
DEVICE PRESTO INFLATION (MISCELLANEOUS) ×3 IMPLANT
GOWN STRL XL  DISP (MISCELLANEOUS) ×6 IMPLANT
NEEDLE ENTRY 21GA 7CM ECHOTIP (NEEDLE) ×3 IMPLANT
PACK ANGIOGRAPHY (CUSTOM PROCEDURE TRAY) ×3 IMPLANT
SET INTRO CAPELLA COAXIAL (SET/KITS/TRAYS/PACK) ×6 IMPLANT
SHEATH BRITE TIP 8FRX11 (SHEATH) ×3 IMPLANT
SHIELD X-DRAPE GOLD 12X17 (MISCELLANEOUS) ×3 IMPLANT
WIRE MAGIC TORQUE 260C (WIRE) ×3 IMPLANT

## 2016-12-18 NOTE — Progress Notes (Signed)
Patient here today for lower extremity angiogram with Dr Delana Meyer. Attached to monitor and will follow vitals throughout entire procedure.

## 2016-12-18 NOTE — OR Nursing (Signed)
BG 168 at 1045 after 48ml D50 at 1005.

## 2016-12-18 NOTE — Op Note (Signed)
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  Ultrasound-guided access to the left superficial femoral vein  2.  Percutaneous transluminal angioplasty of the left external iliac vein to 12 mm  3.  Percutaneous transluminal angioplasty of the left common femoral vein to 14 mm   Anesthesia: Conscious sedation was administered by the interventional radiology RN under my direct supervision. IV Versed plus fentanyl were utilized. Continuous ECG, pulse oximetry and blood pressure was monitored throughout the entire procedure. Conscious sedation was administered for a total of 70 minutes.  Sheath: 8 French sheath left superficial femoral vein retrograde  Contrast: 35 cc   Fluoroscopy Time: 3.8 minutes  Indications:  The patient presented to the office last week with increased swelling and pain of his left lower extremity. Proximally 6 months ago he underwent thrombectomy and treatment of his common iliac vein stenosis. Following the procedure his leg return to "normal". Over the last 6-8 weeks he has noticed all of the swelling and the pain has returned. He initially presented concerned that he had new blood clot. Discussion regarding possible restenosis at the common iliac level or new DVT was undertaken and he elected to move forward with venography and possible intervention. The risks and benefits were reviewed all questions were answered patient agrees to proceed.  Procedure:  Donald Friedt Wilsonis a 66 y.o. male who was identified and appropriate procedural time out was performed.  The patient was then placed supine on the table and prepped and draped in the usual sterile fashion.  Ultrasound was used  to evaluate the left common femoral vein. However, by duplex ultrasound the vein appears to be nearly occluded. It is noncompressible. There is heterogeneous material present consistent with chronic changes. Ultimately, attempts at accessing the common femoral vein were abandoned and I evaluated the superficial femoral vein with ultrasound..  The superficial femoral vein was echolucent and pulsatile indicating it is patent .  An ultrasound image was acquired for the permanent record.  A micropuncture needle was used to access the superficial femoral vein under direct ultrasound guidance.  The microwire was then advanced under fluoroscopic guidance without difficulty followed by the micro-sheath.  A 0.035 J wire was advanced without resistance and a 8Fr sheath was placed.    Venography was then performed by hand injection through the 8 French sheath and this demonstrated a string sign within the common femoral vein and greater than 80% stenosis of the external iliac vein near the leading edge of the previous stent. The previously placed stent is widely patent as is the inferior vena cava. Using a Magic torque wire and a Kumpe catheter the wire and catheter were negotiated into the common iliac vein.  3000 units of heparin was then given and allowed circulate for 4 minutes.  A series of balloon dilatations was then performed beginning with an 8 mm [place] both the common femoral vein and the iliac vein was angioplastied inflations were to 20+ atmospheres for 1 minute. Subsequently a 12 mm Lutonix drug-eluting balloon was used to dilate the external iliac vein. Inflations were to 14 atm for 1 minute. A 10 mm Lutonix drug-eluting balloon was used to dilate the common femoral again the inflation was to 14 atm for 1 minute. Follow-up  imaging demonstrated less than 5% residual stenosis within the external iliac vein however the common femoral vein demonstrated minimal change. I therefore elected to treat the common  femoral vein with a 12 mm balloon and subsequently a 14 mm balloon. The 14 mm balloon was an Atlas balloon inflated to 20 atm for 2 full minutes. Following this hand injection contrast demonstrated moderate improvement with approximately 40-50% residual stenosis.  I did not feel that balloon dilatation larger than 14 is appropriate in the common femoral vein and this is not an area that can be stented. I therefore will terminate the procedure at this point having achieved significant improvement and follow him in the office to see if his leg swelling and pain subside.  Findings:  The common femoral vein is a string sign there does not appear to be any acute clot associated with this this appears to be chronic changes. The external iliac vein demonstrates high-grade stenosis. The previously placed stent within the proximal external iliac and the complete common iliac vein is widely patent. Following and plasty the external iliac demonstrates wide patency with minimal residual stenosis. Following angioplasty of the common femoral there is moderate residual stenosis.     Disposition: Patient was taken to the recovery room in stable condition having tolerated the procedure well.  Donald Townsend 12/18/2016,7:31 PM

## 2016-12-18 NOTE — H&P (Signed)
[facility] History & Physical Update  The patient was interviewed and re-examined.  The patient's previous History and Physical has been reviewed and is unchanged.  There is no change in the plan of care. We plan to proceed with the scheduled procedure.  Hortencia Pilar, MD  12/18/2016, 9:54 AM

## 2016-12-18 NOTE — Discharge Instructions (Signed)
Moderate Conscious Sedation, Adult, Care After °These instructions provide you with information about caring for yourself after your procedure. Your health care provider may also give you more specific instructions. Your treatment has been planned according to current medical practices, but problems sometimes occur. Call your health care provider if you have any problems or questions after your procedure. °What can I expect after the procedure? °After your procedure, it is common: °· To feel sleepy for several hours. °· To feel clumsy and have poor balance for several hours. °· To have poor judgment for several hours. °· To vomit if you eat too soon. ° °Follow these instructions at home: °For at least 24 hours after the procedure: ° °· Do not: °? Participate in activities where you could fall or become injured. °? Drive. °? Use heavy machinery. °? Drink alcohol. °? Take sleeping pills or medicines that cause drowsiness. °? Make important decisions or sign legal documents. °? Take care of children on your own. °· Rest. °Eating and drinking °· Follow the diet recommended by your health care provider. °· If you vomit: °? Drink water, juice, or soup when you can drink without vomiting. °? Make sure you have little or no nausea before eating solid foods. °General instructions °· Have a responsible adult stay with you until you are awake and alert. °· Take over-the-counter and prescription medicines only as told by your health care provider. °· If you smoke, do not smoke without supervision. °· Keep all follow-up visits as told by your health care provider. This is important. °Contact a health care provider if: °· You keep feeling nauseous or you keep vomiting. °· You feel light-headed. °· You develop a rash. °· You have a fever. °Get help right away if: °· You have trouble breathing. °This information is not intended to replace advice given to you by your health care provider. Make sure you discuss any questions you have  with your health care provider. °Document Released: 02/04/2013 Document Revised: 09/19/2015 Document Reviewed: 08/06/2015 °Elsevier Interactive Patient Education © 2018 [employer] °Venogram, Care After °This sheet gives you information about how to care for yourself after your procedure. Your health care provider may also give you more specific instructions. If you have problems or questions, contact your health care provider. °What can I expect after the procedure? °After the procedure, it is common to have: °· Bruising or mild discomfort in the area where the IV was inserted (insertion site). ° °Follow these instructions at home: °Eating and drinking °· Follow instructions from your health care provider about eating or drinking restrictions. °· Drink a lot of fluids for the first several days after the procedure, as directed by your health care provider. This helps to wash (flush) the contrast out of your body. Examples of healthy fluids include water or low-calorie drinks. °General instructions °· Check your IV insertion area every day for signs of infection. Check for: °? Redness, swelling, or pain. °? Fluid or blood. °? Warmth. °? Pus or a bad smell. °· Take over-the-counter and prescription medicines only as told by your health care provider. °· Rest and return to your normal activities as told by your health care provider. Ask your health care provider what activities are safe for you. °· Do not drive for 24 hours if you were given a medicine to help you relax (sedative), or until your health care provider approves. °· Keep all follow-up visits as told by your health care provider. This is important. °Contact a   health care provider if: °· Your skin becomes itchy or you develop a rash or hives. °· You have a fever that does not get better with medicine. °· You feel nauseous. °· You vomit. °· You have redness, swelling, or pain around the insertion site. °· You have fluid or blood coming from the insertion  site. °· Your insertion area feels warm to the touch. °· You have pus or a bad smell coming from the insertion site. °Get help right away if: °· You have difficulty breathing or shortness of breath. °· You develop chest pain. °· You faint. °· You feel very dizzy. °These symptoms may represent a serious problem that is an emergency. Do not wait to see if the symptoms will go away. Get medical help right away. Call your local emergency services (911 in the [place] Do not drive yourself to the hospital. °Summary °· After your procedure, it is common to have bruising or mild discomfort in the area where the IV was inserted. °· You should check your IV insertion area every day for signs of infection. °· Take over-the-counter and prescription medicines only as told by your health care provider. °· You should drink a lot of fluids for the first several days after the procedure to help flush the contrast from your body. °This information is not intended to replace advice given to you by your health care provider. Make sure you discuss any questions you have with your health care provider. °Document Released: 02/04/2013 Document Revised: 03/10/2016 Document Reviewed: 03/10/2016 °Elsevier Interactive Patient Education © 2017 Elsevier Inc. ° °

## 2016-12-19 ENCOUNTER — Encounter: Payer: Self-pay | Admitting: Vascular Surgery

## 2017-01-01 ENCOUNTER — Encounter: Payer: Self-pay | Admitting: Urology

## 2017-01-01 ENCOUNTER — Telehealth: Payer: Self-pay | Admitting: Urology

## 2017-01-01 ENCOUNTER — Ambulatory Visit (INDEPENDENT_AMBULATORY_CARE_PROVIDER_SITE_OTHER): Payer: Medicare Other | Admitting: Urology

## 2017-01-01 VITALS — BP 154/94 | HR 87 | Ht 70.0 in | Wt 257.2 lb

## 2017-01-01 DIAGNOSIS — N2889 Other specified disorders of kidney and ureter: Secondary | ICD-10-CM

## 2017-01-01 DIAGNOSIS — N3289 Other specified disorders of bladder: Secondary | ICD-10-CM | POA: Diagnosis not present

## 2017-01-01 DIAGNOSIS — N400 Enlarged prostate without lower urinary tract symptoms: Secondary | ICD-10-CM | POA: Diagnosis not present

## 2017-01-01 LAB — BLADDER SCAN AMB NON-IMAGING: SCAN RESULT: 514

## 2017-01-01 NOTE — Progress Notes (Signed)
01/01/2017 4:44 PM   Donald Townsend 08-18-1950 [id number]  Referring provider: Dion Body, MD [facility] [place], [place] [address]  Chief Complaint  Patient presents with  . New Patient (Initial Visit)    BPH referred by Dr. Doy Mince    HPI: Patient is a 66 year old Caucasian male who is referred by Dr. Netty Starring for BPH with retention.  BPH WITH LUTS  (prostate and/or bladder) His IPSS score today is 35, which is severe lower urinary tract symptomatology.  He is terrible with his quality life due to his urinary symptoms. His PVR is 500 mL.    His major complaint today is nocturia x 5.  He has had these symptoms for two weeks.  He denies any dysuria, hematuria or suprapubic pain.   He currently taking tamsulosin 0.4 mg daily.       He also denies any recent fevers, chills, nausea or vomiting.  He brother had non fatal prostate cancer.        IPSS    Row Name 01/01/17 1500         International Prostate Symptom Score   How often have you had the sensation of not emptying your bladder? Almost always     How often have you had to urinate less than every two hours? Almost always     How often have you found you stopped and started again several times when you urinated? Almost always     How often have you found it difficult to postpone urination? Almost always     How often have you had a weak urinary stream? Almost always     How often have you had to strain to start urination? Almost always     How many times did you typically get up at night to urinate? 5 Times     Total IPSS Score 35       Quality of Life due to urinary symptoms   If you were to spend the rest of your life with your urinary condition just the way it is now how would you feel about that? Terrible        Score:  1-7 Mild 8-19 Moderate 20-35 Severe  Patient has a history of colon cancer and is status post colectomy. He is currently not under the  care of an oncologist. He states that there is a mass in his lower abdomen which they are truly leading to scar tissue. He states that they call him periodically for blood work to monitor the cancer.  He saw Dr. Eliberto Ivory shortly after he underwent the surgery for his colon cancer for incontinence.  He had one visit and did not return for follow up as he was told this was common after surgery.    A CT Venogram of the abdomen and pelvis performed in 03/2016 noted an increased size of irregular wall thickening involving chronic presacral fluid collection, now measuring 8.2 cm in diameter, previously, 5.9 cm (when compared to the 08/24/2014 examination).  Additionally, there has been development of ill-defined approximately 4.3 cm masslike thickening involving the posterior aspect of the left side of the urinary bladder which is contiguous with the ill-defined presacral fluid collection and results in mild left-sided ureterectasis and pelvicaliectasis. While potentially the sequela of chronic infection, local recurrence is not excluded.  Further evaluation with cystoscopy could be performed as indicated.  Cr has increased from 0.8 to 1.3.    Reviewed referral notes -  tamsulosin was not was not helpful   PMH: Past Medical History:  Diagnosis Date  . A-fib ([place])   . Cancer [facility])    Colon  . CHF (congestive heart failure) ([place])   . Diabetes mellitus without complication ([place])   . Dyspnea   . Elevated lipids   . Hypertension   . PVD (peripheral vascular disease) [facility])     Surgical History: Past Surgical History:  Procedure Laterality Date  . COLON RESECTION     with colostomy  . COLONOSCOPY    . IVC FILTER REMOVAL N/A 08/28/2016   Procedure: IVC Filter Removal;  Surgeon: Katha Cabal, MD;  Location: [place] [facility];  Service: Cardiovascular;  Laterality: N/A;  . LOWER EXTREMITY VENOGRAPHY Left 12/18/2016   Procedure: Lower Extremity Venography;  Surgeon: Katha Cabal, MD;  Location:  [place] [facility];  Service: Cardiovascular;  Laterality: Left;  . PERIPHERAL VASCULAR CATHETERIZATION Left 05/08/2016   Procedure: Lower Extremity Venography;  Surgeon: Katha Cabal, MD;  Location: [place] [facility];  Service: Cardiovascular;  Laterality: Left;    Home Medications:  Allergies as of 01/01/2017   No Known Allergies     Medication List       Accurate as of 01/01/17  4:44 PM. Always use your most recent med list.          aspirin EC 81 MG tablet Take 81 mg by mouth daily.   digoxin 0.25 MG tablet Commonly known as:  LANOXIN Take 0.25 mg by mouth daily.   ELIQUIS 5 MG Tabs tablet Generic drug:  apixaban Take 5 mg by mouth 2 (two) times daily.   furosemide 40 MG tablet Commonly known as:  LASIX Take 40 mg by mouth daily.   glimepiride 4 MG tablet Commonly known as:  AMARYL Take 4 mg by mouth 2 (two) times daily.   insulin aspart 100 UNIT/ML FlexPen Commonly known as:  NOVOLOG Inject 3 Units into the skin.   Lancets 30G Misc Use 1 Units as directed. Check CBG's up to twice daily. Dx: E11.9   LANTUS SOLOSTAR 100 UNIT/ML Solostar Pen Generic drug:  Insulin Glargine Inject 10 Units into the skin at bedtime.   losartan 100 MG tablet Commonly known as:  COZAAR take 100mg  by mouth once daily   metFORMIN 1000 MG tablet Commonly known as:  GLUCOPHAGE take 1000mg  by mouth twice daily with food   metoprolol tartrate 25 MG tablet Commonly known as:  LOPRESSOR Take 25 mg by mouth 2 (two) times daily.   pravastatin 20 MG tablet Commonly known as:  PRAVACHOL take 20mg  by mouth daily at bedtime   tamsulosin 0.4 MG Caps capsule Commonly known as:  FLOMAX Take 0.4 mg by mouth daily.            Discharge Care Instructions        Start     Ordered   01/01/17 0000  BLADDER SCAN AMB NON-IMAGING     01/01/17 1508   01/01/17 0000  PSA     01/01/17 1522   01/01/17 0000  CT HEMATURIA WORKUP    Question Answer Comment  Reason for Exam (SYMPTOM   OR DIAGNOSIS REQUIRED) bladder mass and left hydronephrosis   Preferred imaging location? [facility]      01/01/17 1610   01/01/17 0000  Urinalysis, Complete     01/01/17 1631   01/01/17 0000  BUN+Creat     01/01/17 1639   01/01/17 0000  CULTURE, URINE  COMPREHENSIVE     01/01/17 1643      Allergies: No Known Allergies  Family History: Family History  Problem Relation Age of Onset  . Diabetes Mother   . Stroke Mother   . Prostate cancer Brother   . Kidney cancer Neg Hx   . Bladder Cancer Neg Hx     Social History:  reports that he has never smoked. He has never used smokeless tobacco. He reports that he does not drink alcohol or use drugs.  ROS: UROLOGY Frequent Urination?: Yes Hard to postpone urination?: Yes Burning/pain with urination?: No Get up at night to urinate?: Yes Leakage of urine?: No Urine stream starts and stops?: No Trouble starting stream?: Yes Do you have to strain to urinate?: Yes Blood in urine?: No Urinary tract infection?: No Sexually transmitted disease?: No Injury to kidneys or bladder?: No Painful intercourse?: No Weak stream?: Yes Erection problems?: Yes Penile pain?: No  Gastrointestinal Nausea?: No Vomiting?: No Indigestion/heartburn?: No Diarrhea?: No Constipation?: No  Constitutional Fever: No Night sweats?: No Weight loss?: Yes Fatigue?: Yes  Skin Skin rash/lesions?: No Itching?: No  Eyes Blurred vision?: No Double vision?: No  Ears/Nose/Throat Sore throat?: No Sinus problems?: No  Hematologic/Lymphatic Swollen glands?: No Easy bruising?: No  Cardiovascular Leg swelling?: Yes Chest pain?: No  Respiratory Cough?: No Shortness of breath?: No  Endocrine Excessive thirst?: No  Musculoskeletal Back pain?: Yes Joint pain?: No  Neurological Headaches?: No Dizziness?: No  Psychologic Depression?: No Anxiety?: No  Physical Exam: BP (!) 154/94   Pulse 87   Ht 5\' 10"  (1.778 m)   Wt 257 lb  3.2 oz (116.7 kg)   BMI 36.90 kg/m   Constitutional: Well nourished. Alert and oriented, No acute distress. HEENT: [place] AT, moist mucus membranes. Trachea midline, no masses. Cardiovascular: No clubbing, cyanosis, or edema. Respiratory: Normal respiratory effort, no increased work of breathing. GI: Abdomen is soft, non tender, non distended, no abdominal masses.  Rectal: Patient has not rectum.   Skin: No rashes, bruises or suspicious lesions. Lymph: No cervical or inguinal adenopathy. Neurologic: Grossly intact, no focal deficits, moving all 4 extremities. Psychiatric: Normal mood and affect.  Laboratory Data: Lab Results  Component Value Date   WBC 8.4 05/09/2016   HGB 12.5 (L) 05/09/2016   HCT 36.3 (L) 05/09/2016   MCV 91.7 05/09/2016   PLT 181 05/09/2016    Lab Results  Component Value Date   CREATININE 1.39 (H) 12/17/2016    Lab Results  Component Value Date   AST 32 04/19/2016   Lab Results  Component Value Date   ALT 24 04/19/2016    Urinalysis 3-10 RBC's.  See EPIC.    I have reviewed the labs.   Pertinent Imaging: CLINICAL DATA:  Left lower extremity swelling for the past week. History of colon cancer, diabetes, hypertension and atrial fibrillation.  EXAM: CTV OF THE ABDOMEN AND PELVIS WITH CONTRAST  TECHNIQUE: Multidetector CT imaging of the abdomen and pelvis was performed using the venous phase protocol following bolus administration of intravenous contrast.  CONTRAST:  185mL ISOVUE-300 IOPAMIDOL (ISOVUE-300) INJECTION 61%  COMPARISON:  CT the abdomen pelvis - 08/24/2014; 12/05/2012; left lower extremity venous Doppler ultrasound - 04/19/2016  FINDINGS: Vascular: Moderate amount of mixed calcified and noncalcified atherosclerotic plaque with a normal caliber abdominal aorta, not resulting in hemodynamically significant stenosis.  The left common iliac artery results in mass effect upon the ipsilateral left common iliac vein (image  72, series 5). There is suspected (at  least) nonocclusive thrombus within the peripheral aspect of the left common iliac vein (image 76, series 5) likely extending to the central aspect of the left external iliac vein. The left common femoral vein appears patent given suboptimal opacification.  Lower chest: Limited visualization of lower thorax demonstrates minimal subpleural atelectasis within image right lower lobe. No focal airspace opacities. No pleural effusion. Normal heart size. No pericardial effusion.  Hepatobiliary: Normal hepatic contour. No discrete hepatic lesions. Normal appearance of the gallbladder given degree distention. No radiopaque gallstones. No intra extrahepatic bili duct dilatation. No ascites.  Pancreas: Normal appearance of the pancreas.  Spleen: Normal appearance of spleen.  Adrenals/Urinary Tract: There is very mildly delayed enhancement and excretion of the left kidney in comparison to the right with associated mild left-sided pelviectasis, ureterectasis and asymmetric left-sided perinephric stranding.  This finding is associated with ill-defined masslike thickening involving the left posterior aspect of the urinary bladder measuring approximately 4.3 x 3.4 cm (image 89, series 5) which is contiguous with the chronic though minimally enlarged presacral fluid collection.  No evidence of right-sided urinary obstruction. No definite renal stones this postcontrast examination. Subcentimeter hypoattenuating left-sided renal lesion is too small to adequately characterize of favored to represent renal cysts.  Normal appearance of the bilateral adrenal glands.  Stomach/Bowel: While the central fluid component of the chronic presacral collection is grossly unchanged measuring approximately 4.6 x 2.8 cm, the irregular thick wall of this collection has increased in size in the interval, currently measuring 8.2 x 5.1 cm (image 87, series 5),  previously, 5.9 x 4.2 cm when compared to the 08/24/2014 examination.  This irregular ill-defined soft tissue extends to abut the posterior aspect of the left-sided the urinary bladder and contributes to mild left-sided ureterectasis and pelvicaliectasis.  Post left lower quadrant end colostomy. Enteric contrast extends to the level of the transverse colon. No evidence of enteric obstruction. Normal appearance of the terminal ileum and appendix. No pneumoperitoneum, pneumatosis or portal venous gas.  Lymphatic: Scattered porta hepatis, retroperitoneal and bilateral inguinal lymph nodes are individually not enlarged by size criteria. No definitive bulky retroperitoneal, mesenteric, pelvic or inguinal lymphadenopathy.  Reproductive: Normal appearance of the prostate gland.  Other: There is marked asymmetric swelling involving the imaged portion of the left thigh.  Musculoskeletal: No acute or aggressive osseous abnormalities. 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  IMPRESSION: 1. Increased size of irregular wall thickening involving chronic presacral fluid collection, now measuring 8.2 cm in diameter, previously, 5.9 cm (when compared to the 08/24/2014 examination). Additionally, there has been development of ill-defined approximately 4.3 cm masslike thickening involving the posterior aspect of the left side of the urinary bladder which is contiguous with the ill-defined presacral fluid collection and results in mild left-sided ureterectasis and pelvicaliectasis. While potentially the sequela of chronic infection, local recurrence is not excluded. Further evaluation with cystoscopy could be performed as indicated. 2. 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 This finding is associated with asymmetric  soft tissue swelling about the imaged portions of the left thigh. 3. Post left lower quadrant end colostomy without evidence of enteric obstruction. 4. Aortic Atherosclerosis (ICD10-170.0) Critical Value/emergent results were called by telephone at the time of interpretation on 04/27/2016 at 4:29 pm to Dr. Bartholome Bill , who verbally acknowledged these results.   Electronically Signed  By: Sandi Mariscal M.D.   On: 04/27/2016 16:50  I have independently reviewed the films.    Assessment & Plan:    1. Bladder mass  - UA and urine culture  - seen on 03/2016 CT scan - ? Of colon cancer invading the bladder  - patient will be scheduled for CTU and cystoscopy  2. Left pelviectasis  - BUN + creatinine  - CTU pending   3. Urinary retention  - IPSS score is 35/6  - Continue conservative management, avoiding bladder irritants and timed voiding's  - most bothersome symptoms is/are nocturia  - Continue tamsulosin 0.4 mg daily  - Foley placed   - RTC for CTU and cystoscopy   Return for CT Urogram report and cystoscopy.  These notes generated with voice recognition software. I apologize for typographical errors.  Zara Council, [facility] [address], [place], [place] [address] [phone]

## 2017-01-01 NOTE — Progress Notes (Signed)
Simple Catheter Placement  Due to urinary retention patient is present today for a foley cath placement.  Patient was cleaned and prepped in a sterile fashion with betadine and lidocaine jelly 2% was instilled into the urethra.  A 16 FR coude foley catheter was inserted, urine return was noted  389ml, urine was clear yellow in color.  The balloon was filled with 10cc of sterile water.  A leg bag was attached for drainage. Patient was also given a night bag to take home and was given instruction on how to change from one bag to another.  Patient was given instruction on proper catheter care.  Patient tolerated well, no complications were noted   Preformed by: Toniann Fail, LPN

## 2017-01-01 NOTE — Telephone Encounter (Signed)
Would you send my note to Dr. Netty Starring?

## 2017-01-02 LAB — URINALYSIS, COMPLETE
Bilirubin, UA: NEGATIVE
GLUCOSE, UA: NEGATIVE
KETONES UA: NEGATIVE
Leukocytes, UA: NEGATIVE
Nitrite, UA: NEGATIVE
PROTEIN UA: NEGATIVE
SPEC GRAV UA: 1.01 (ref 1.005–1.030)
Urobilinogen, Ur: 0.2 mg/dL (ref 0.2–1.0)
pH, UA: 5 (ref 5.0–7.5)

## 2017-01-02 LAB — MICROSCOPIC EXAMINATION
Bacteria, UA: NONE SEEN
Epithelial Cells (non renal): NONE SEEN /hpf (ref 0–10)
WBC, UA: NONE SEEN /hpf (ref 0–?)

## 2017-01-02 LAB — PSA: PROSTATE SPECIFIC AG, SERUM: 0.2 ng/mL (ref 0.0–4.0)

## 2017-01-02 NOTE — Telephone Encounter (Signed)
faxed

## 2017-01-03 ENCOUNTER — Ambulatory Visit (INDEPENDENT_AMBULATORY_CARE_PROVIDER_SITE_OTHER): Payer: Medicare Other | Admitting: Vascular Surgery

## 2017-01-03 LAB — CULTURE, URINE COMPREHENSIVE

## 2017-01-03 LAB — BUN+CREAT
BUN / CREAT RATIO: 12 (ref 10–24)
BUN: 21 mg/dL (ref 8–27)
CREATININE: 1.82 mg/dL — AB (ref 0.76–1.27)
GFR calc Af Amer: 44 mL/min/{1.73_m2} — ABNORMAL LOW (ref >59)
GFR, EST NON AFRICAN AMERICAN: 38 mL/min/{1.73_m2} — AB (ref >59)

## 2017-01-03 LAB — SPECIMEN STATUS REPORT

## 2017-01-04 ENCOUNTER — Telehealth: Payer: Self-pay

## 2017-01-04 ENCOUNTER — Ambulatory Visit (INDEPENDENT_AMBULATORY_CARE_PROVIDER_SITE_OTHER): Payer: Medicare Other | Admitting: Vascular Surgery

## 2017-01-04 DIAGNOSIS — N390 Urinary tract infection, site not specified: Secondary | ICD-10-CM

## 2017-01-04 MED ORDER — AMOXICILLIN-POT CLAVULANATE 875-125 MG PO TABS: 1.0000 | ORAL_TABLET | Freq: Two times a day (BID) | ORAL | 0 refills | Status: AC

## 2017-01-04 NOTE — Telephone Encounter (Signed)
LMOM- ucx positive and abx sent to pharmacy.

## 2017-01-04 NOTE — Telephone Encounter (Signed)
-----   Message from Nori Riis, PA-C sent at 01/03/2017  4:58 PM EDT ----- Please let Mr. Hellberg know that he has an UTI.  We need to start Augmentin 875/125, one twice daily x 7 for him.

## 2017-01-07 ENCOUNTER — Encounter (INDEPENDENT_AMBULATORY_CARE_PROVIDER_SITE_OTHER): Payer: Self-pay | Admitting: Vascular Surgery

## 2017-01-07 ENCOUNTER — Ambulatory Visit (INDEPENDENT_AMBULATORY_CARE_PROVIDER_SITE_OTHER): Payer: Medicare Other | Admitting: Vascular Surgery

## 2017-01-07 VITALS — Resp 16 | Ht 71.0 in | Wt 256.0 lb

## 2017-01-07 DIAGNOSIS — I82422 Acute embolism and thrombosis of left iliac vein: Secondary | ICD-10-CM

## 2017-01-07 DIAGNOSIS — I871 Compression of vein: Secondary | ICD-10-CM | POA: Diagnosis not present

## 2017-01-07 DIAGNOSIS — I482 Chronic atrial fibrillation, unspecified: Secondary | ICD-10-CM

## 2017-01-07 DIAGNOSIS — E782 Mixed hyperlipidemia: Secondary | ICD-10-CM | POA: Diagnosis not present

## 2017-01-07 DIAGNOSIS — E119 Type 2 diabetes mellitus without complications: Secondary | ICD-10-CM | POA: Diagnosis not present

## 2017-01-10 DIAGNOSIS — Z23 Encounter for immunization: Secondary | ICD-10-CM | POA: Diagnosis not present

## 2017-01-11 ENCOUNTER — Ambulatory Visit
Admission: RE | Admit: 2017-01-11 | Discharge: 2017-01-11 | Disposition: A | Discharge status: Home / Self Care | Payer: Medicare Other | Source: Ambulatory Visit | Attending: Urology | Admitting: Urology

## 2017-01-11 ENCOUNTER — Telehealth: Payer: Self-pay | Admitting: Urology

## 2017-01-11 ENCOUNTER — Other Ambulatory Visit: Payer: Self-pay | Admitting: Urology

## 2017-01-11 DIAGNOSIS — N1330 Unspecified hydronephrosis: Secondary | ICD-10-CM | POA: Insufficient documentation

## 2017-01-11 DIAGNOSIS — D494 Neoplasm of unspecified behavior of bladder: Secondary | ICD-10-CM | POA: Insufficient documentation

## 2017-01-11 DIAGNOSIS — Z933 Colostomy status: Secondary | ICD-10-CM | POA: Diagnosis not present

## 2017-01-11 DIAGNOSIS — R590 Localized enlarged lymph nodes: Secondary | ICD-10-CM | POA: Diagnosis not present

## 2017-01-11 DIAGNOSIS — N3289 Other specified disorders of bladder: Secondary | ICD-10-CM

## 2017-01-11 DIAGNOSIS — C189 Malignant neoplasm of colon, unspecified: Secondary | ICD-10-CM | POA: Diagnosis not present

## 2017-01-11 DIAGNOSIS — N2889 Other specified disorders of kidney and ureter: Secondary | ICD-10-CM | POA: Diagnosis not present

## 2017-01-11 DIAGNOSIS — Z9189 Other specified personal risk factors, not elsewhere classified: Secondary | ICD-10-CM

## 2017-01-11 MED ORDER — IOPAMIDOL (ISOVUE-300) INJECTION 61%
100.0000 mL | Freq: Once | INTRAVENOUS | Status: AC | PRN
  Administered 2017-01-11: 100 mL via INTRAVENOUS

## 2017-01-11 NOTE — Progress Notes (Unsigned)
I have spoken to Donald Townsend and discussed his CT results with him.  I advised him that we will need to place nephrostomy tubes bilaterally within the next week as both of his ureters are obstructed by an enlarging pelvic mass.  I explained to the patient that we will also need to refer him on to the cancer center as the findings on the CT scan are worrisome for the metastasis of his colon cancer.    I will notify Amy on Monday to get him scheduled for the nephrostomy tube placement.  I will also reach out to Kristi at [facility] center to have the patient evaluated through their clinic.

## 2017-01-11 NOTE — Telephone Encounter (Signed)
Please send my note to Dr. Netty Starring.

## 2017-01-12 NOTE — Progress Notes (Signed)
MRN : [id number]  Donald Townsend is a 66 y.o. (Sep 03, 1950) male who presents with chief complaint of  Chief Complaint  Patient presents with  . Follow-up    2 week [facility] f/u  .  History of Present Illness:  The patient presents to the office for evaluation of DVT.  DVT was identified at [facility] by Duplex ultrasound.  The initial symptoms were pain and swelling in the lower extremity.  He is s/p intervention and reports his left leg is feeling much better.  The patient notes the leg continues to be a little painful with dependency and swells a bit.  Symptoms are much better with elevation.  The patient notes minimal edema in the morning which steadily worsens throughout the day.    The patient has now been using compression therapy at this point.  No SOB or pleuritic chest pains.  No cough or hemoptysis.  No blood per rectum or blood in any sputum.  No excessive bruising per the patient.       No outpatient prescriptions have been marked as taking for the 01/07/17 encounter (Office Visit) with Delana Meyer, Dolores Lory, MD.    Past Medical History:  Diagnosis Date  . A-fib ([place])   . Cancer [facility])    Colon  . CHF (congestive heart failure) ([place])   . Diabetes mellitus without complication ([place])   . Dyspnea   . Elevated lipids   . Hypertension   . PVD (peripheral vascular disease) ([place])     Past Surgical History:  Procedure Laterality Date  . COLON RESECTION     with colostomy  . COLONOSCOPY    . IVC FILTER REMOVAL N/A 08/28/2016   Procedure: IVC Filter Removal;  Surgeon: Katha Cabal, MD;  Location: [place] [facility];  Service: Cardiovascular;  Laterality: N/A;  . LOWER EXTREMITY VENOGRAPHY Left 12/18/2016   Procedure: Lower Extremity Venography;  Surgeon: Katha Cabal, MD;  Location: [place] [facility];  Service: Cardiovascular;  Laterality: Left;  . PERIPHERAL VASCULAR CATHETERIZATION Left 05/08/2016   Procedure: Lower Extremity Venography;  Surgeon: Katha Cabal, MD;  Location: [place] [facility];  Service: Cardiovascular;  Laterality: Left;    Social History Social History  Substance Use Topics  . Smoking status: Never Smoker  . Smokeless tobacco: Never Used  . Alcohol use No    Family History Family History  Problem Relation Age of Onset  . Diabetes Mother   . Stroke Mother   . Prostate cancer Brother   . Kidney cancer Neg Hx   . Bladder Cancer Neg Hx     No Known Allergies   REVIEW OF SYSTEMS (Negative unless checked)  Constitutional: [] Weight loss  [] Fever  [] Chills Cardiac: [] Chest pain   [] Chest pressure   [] Palpitations   [] Shortness of breath when laying flat   [] Shortness of breath with exertion. Vascular:  [] Pain in legs with walking   [x] Pain in legs at rest  [x] History of DVT   [] Phlebitis   [x] Swelling in legs   [] Varicose veins   [] Non-healing ulcers Pulmonary:   [] Uses home oxygen   [] Productive cough   [] Hemoptysis   [] Wheeze  [] COPD   [] Asthma Neurologic:  [] Dizziness   [] Seizures   [] History of stroke   [] History of TIA  [] Aphasia   [] Vissual changes   [] Weakness or numbness in arm   [] Weakness or numbness in leg Musculoskeletal:   [] Joint swelling   [] Joint pain   [] Low back pain  Hematologic:  [] Easy bruising  [] Easy bleeding   [] Hypercoagulable state   [] Anemic Gastrointestinal:  [] Diarrhea   [] Vomiting  [] Gastroesophageal reflux/heartburn   [] Difficulty swallowing. Genitourinary:  [] Chronic kidney disease   [] Difficult urination  [] Frequent urination   [] Blood in urine Skin:  [] Rashes   [] Ulcers  Psychological:  [] History of anxiety   []  History of major depression.  Physical Examination  Vitals:   01/07/17 1609  Resp: 16  Weight: 256 lb (116.1 kg)  Height: 5\' 11"  (1.803 m)   Body mass index is 35.7 kg/m. Gen: WD/WN, NAD Head: [place]/AT, No temporalis wasting.  Ear/Nose/Throat: Hearing grossly intact, nares w/o erythema or drainage Eyes: PER, EOMI, sclera nonicteric.  Neck: Supple, no large  masses.   Pulmonary:  Good air movement, no audible wheezing bilaterally, no use of accessory muscles.  Cardiac: RRR, no JVD Vascular: left leg with 2-3+ edema  Moderate venous skin changes Vessel Right Left  Radial Palpable Palpable  PT Palpable Palpable  DP Palpable Palpable  Gastrointestinal: Non-distended. No guarding/no peritoneal signs.  Musculoskeletal: M/S 5/5 throughout.  No deformity or atrophy.  Neurologic: CN 2-12 intact. Symmetrical.  Speech is fluent. Motor exam as listed above. Psychiatric: Judgment intact, Mood & affect appropriate for pt's clinical situation. Dermatologic: venous rashes no ulcers noted.  No changes consistent with cellulitis. Lymph : No lichenification or skin changes of chronic lymphedema.  CBC Lab Results  Component Value Date   WBC 8.4 05/09/2016   HGB 12.5 (L) 05/09/2016   HCT 36.3 (L) 05/09/2016   MCV 91.7 05/09/2016   PLT 181 05/09/2016    BMET    Component Value Date/Time   NA 140 12/17/2016 0859   NA 139 12/17/2012 0608   K 4.4 12/17/2016 0859   K 3.8 12/17/2012 0608   CL 102 12/17/2016 0859   CL 108 (H) 12/17/2012 0608   CO2 28 12/17/2016 0859   CO2 26 12/17/2012 0608   GLUCOSE 172 (H) 12/17/2016 0859   GLUCOSE 217 (H) 12/17/2012 0608   BUN 21 01/01/2017 1621   BUN 15 12/17/2012 0608   CREATININE 1.82 (H) 01/01/2017 1621   CREATININE 0.84 12/17/2012 0608   CALCIUM 9.8 12/17/2016 0859   CALCIUM 8.2 (L) 12/17/2012 0608   GFRNONAA 38 (L) 01/01/2017 1621   GFRNONAA >60 12/17/2012 0608   GFRAA 44 (L) 01/01/2017 1621   GFRAA >60 12/17/2012 0608   Estimated Creatinine Clearance: 52.4 mL/min (A) (by C-G formula based on SCr of 1.82 mg/dL (H)).  COAG Lab Results  Component Value Date   INR 1.11 05/08/2016    Radiology [place] Intraoperative  Result Date: 12/18/2016 CLINICAL DATA:  Ultrasound was provided for use by the ordering physician, and a technical charge was applied by the performing facility.  No radiologist  interpretation/professional services rendered.   Ct Hematuria Workup  Result Date: 01/11/2017 CLINICAL DATA:  Followup colon cancer. EXAM: CT ABDOMEN AND PELVIS WITHOUT AND WITH CONTRAST TECHNIQUE: Multidetector CT imaging of the abdomen and pelvis was performed following the standard protocol before and following the bolus administration of intravenous contrast. CONTRAST:  165mL ISOVUE-300 IOPAMIDOL (ISOVUE-300) INJECTION 61% COMPARISON:  CT scan 04/27/2016 and 08/24/2014 FINDINGS: Lower chest: The lung bases are clear of acute process. No pleural effusion or pulmonary lesions. The heart is normal in size. No pericardial effusion. Stable coronary artery calcifications. The distal esophagus and aorta are unremarkable. Hepatobiliary: No focal hepatic lesions or intrahepatic biliary dilatation the gallbladder appears normal. No common bile duct dilatation. Pancreas: No mass,  inflammation or ductal dilatation. Spleen: Normal size.  No focal lesions. Adrenals/Urinary Tract: The adrenal glands are normal in stable. Bilateral hydroureteronephrosis down to enlarging pelvic mass. The right-sided hydroureteronephrosis is new and the left side is progressive. The bladder contains a Foley catheter. There is diffuse asymmetric bladder wall thickening which appears progressive. The delayed images demonstrate compression but not complete obstruction of the right ureter. Stomach/Bowel: The stomach, duodenum, small bowel and colon are grossly normal without oral contrast. No acute inflammatory changes or obstructive findings. Stable left lower quadrant colostomy. Vascular/Lymphatic: Stable atherosclerotic calcifications involving the aorta and iliac arteries. No aneurysm. New retroperitoneal lymphadenopathy. 15 mm periaortic lymph node on image number 38. 14 mm right-sided retroperitoneal lymph node posterior to the IVC on image number 41. Left para-aortic lymph node on image number 50 measures 13.5 mm. A left common iliac vein  stent is noted. Reproductive: The prostate gland is grossly normal. The seminal vesicles are difficult to identified. Other: Enlarging presacral soft tissue mass which appears confluent with the base of the bladder. Central fluid collection appears stable. The presacral tumor is extending higher up into the pelvis where it is likely obstructing both ureters. Bilateral inguinal lymphadenopathy is new. Index node on the right side on image number 94 measures 14.5 mm an index node on the left side on image number 102 measures 17 mm. Musculoskeletal: No definite metastatic osseous disease. IMPRESSION: 1. 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 New right-sided hydroureteronephrosis with compression of the distal ureter but no complete obstruction. The left ureter appears completely obstructed. 2. New retroperitoneal lymphadenopathy. 3. No findings for bowel obstruction. Left lower quadrant colostomy is stable. 4. No definite CT findings for hepatic metastatic disease. 5. New bilateral inguinal lymphadenopathy. Electronically Signed   By: Marijo Sanes M.D.   On: 01/11/2017 13:18    Assessment/Plan 1. Acute deep vein thrombosis (DVT) of iliac vein of left lower extremity (HCC) Recommend:   No surgery or intervention at this point in time.  IVC filter is not indicated at present.  Patient's duplex ultrasound of the venous system shows DVT from the popliteal to the femoral veins.  The patient is initiated on anticoagulation   Elevation was stressed, use of a recliner was discussed.  I have had a long discussion with the patient regarding DVT and post phlebitic changes such as swelling and why it  causes symptoms such as pain.  The patient will wear graduated compression stockings class 1 (20-30 mmHg), beginning after three full days of anticoagulation, on a daily basis a prescription was given. The patient will  beginning  wearing the stockings first thing in the morning and removing them in the evening. The patient is instructed specifically not to sleep in the stockings.  In addition, behavioral modification including elevation during the day and avoidance of prolonged dependency will be initiated.    The patient will continue anticoagulation for now as there have not been any problems or complications at this point.   - VAS [place] LOWER EXTREMITY VENOUS (DVT); Future  2. May-Thurner syndrome See #1  3. Chronic atrial fibrillation (HCC) Continue antiarrhythmia medications as already ordered, these medications have been reviewed and there are no changes at this time.  Continue anticoagulation as ordered by Cardiology Service   4. Type 2 diabetes mellitus without complication, without long-term current use of insulin (HCC) Continue hypoglycemic medications as already ordered, these medications have been reviewed and  there are no changes at this time.  Hgb A1C to be monitored as already arranged by primary service   5. Mixed hyperlipidemia Continue statin as ordered and reviewed, no changes at this time   Hortencia Pilar, MD  01/12/2017 2:05 PM

## 2017-01-14 ENCOUNTER — Other Ambulatory Visit: Payer: Self-pay | Admitting: Radiology

## 2017-01-14 ENCOUNTER — Telehealth: Payer: Self-pay

## 2017-01-14 DIAGNOSIS — N135 Crossing vessel and stricture of ureter without hydronephrosis: Secondary | ICD-10-CM

## 2017-01-14 DIAGNOSIS — N1339 Other hydronephrosis: Secondary | ICD-10-CM

## 2017-01-14 NOTE — Progress Notes (Signed)
Thank you Donald Townsend. I have arranged for him to be seen 9/19 and will notify him regarding appointment.

## 2017-01-14 NOTE — Telephone Encounter (Signed)
Notes faxed  Sharyn Lull

## 2017-01-14 NOTE — Telephone Encounter (Signed)
  Oncology Nurse Navigator Documentation Mr. Estis notified of appointment with Dr. Rogue Bussing 9/19 at 3:00pm arrive at 2:45pm. Readback performed. Navigator Location: [facility] (01/14/17 1400)   )Navigator Encounter Type: Telephone (01/14/17 1400) Telephone: Appt Confirmation/Clarification;Outgoing Call (01/14/17 1400)                                                  Time Spent with Patient: 15 (01/14/17 1400)

## 2017-01-16 ENCOUNTER — Other Ambulatory Visit: Payer: Self-pay | Admitting: *Deleted

## 2017-01-16 ENCOUNTER — Inpatient Hospital Stay: Payer: Medicare Other | Attending: Internal Medicine | Admitting: Internal Medicine

## 2017-01-16 ENCOUNTER — Inpatient Hospital Stay: Payer: Medicare Other

## 2017-01-16 ENCOUNTER — Encounter: Payer: Self-pay | Admitting: Internal Medicine

## 2017-01-16 VITALS — BP 143/74 | HR 77 | Temp 98.1°F | Resp 16 | Wt 261.0 lb

## 2017-01-16 DIAGNOSIS — Z85048 Personal history of other malignant neoplasm of rectum, rectosigmoid junction, and anus: Secondary | ICD-10-CM | POA: Insufficient documentation

## 2017-01-16 DIAGNOSIS — M7989 Other specified soft tissue disorders: Secondary | ICD-10-CM | POA: Diagnosis not present

## 2017-01-16 DIAGNOSIS — C20 Malignant neoplasm of rectum: Secondary | ICD-10-CM | POA: Insufficient documentation

## 2017-01-16 DIAGNOSIS — R1900 Intra-abdominal and pelvic swelling, mass and lump, unspecified site: Secondary | ICD-10-CM | POA: Insufficient documentation

## 2017-01-16 DIAGNOSIS — I129 Hypertensive chronic kidney disease with stage 1 through stage 4 chronic kidney disease, or unspecified chronic kidney disease: Secondary | ICD-10-CM | POA: Diagnosis not present

## 2017-01-16 DIAGNOSIS — I82402 Acute embolism and thrombosis of unspecified deep veins of left lower extremity: Secondary | ICD-10-CM | POA: Insufficient documentation

## 2017-01-16 DIAGNOSIS — R599 Enlarged lymph nodes, unspecified: Secondary | ICD-10-CM

## 2017-01-16 DIAGNOSIS — I739 Peripheral vascular disease, unspecified: Secondary | ICD-10-CM

## 2017-01-16 DIAGNOSIS — Z86718 Personal history of other venous thrombosis and embolism: Secondary | ICD-10-CM | POA: Insufficient documentation

## 2017-01-16 DIAGNOSIS — Z933 Colostomy status: Secondary | ICD-10-CM | POA: Insufficient documentation

## 2017-01-16 DIAGNOSIS — I700 Atherosclerosis of aorta: Secondary | ICD-10-CM | POA: Insufficient documentation

## 2017-01-16 DIAGNOSIS — E119 Type 2 diabetes mellitus without complications: Secondary | ICD-10-CM

## 2017-01-16 DIAGNOSIS — N1330 Unspecified hydronephrosis: Secondary | ICD-10-CM | POA: Diagnosis not present

## 2017-01-16 DIAGNOSIS — R590 Localized enlarged lymph nodes: Secondary | ICD-10-CM | POA: Insufficient documentation

## 2017-01-16 DIAGNOSIS — J449 Chronic obstructive pulmonary disease, unspecified: Secondary | ICD-10-CM | POA: Insufficient documentation

## 2017-01-16 DIAGNOSIS — I4891 Unspecified atrial fibrillation: Secondary | ICD-10-CM | POA: Diagnosis not present

## 2017-01-16 DIAGNOSIS — N189 Chronic kidney disease, unspecified: Secondary | ICD-10-CM | POA: Diagnosis not present

## 2017-01-16 DIAGNOSIS — Z8042 Family history of malignant neoplasm of prostate: Secondary | ICD-10-CM | POA: Insufficient documentation

## 2017-01-16 DIAGNOSIS — I509 Heart failure, unspecified: Secondary | ICD-10-CM

## 2017-01-16 DIAGNOSIS — Z803 Family history of malignant neoplasm of breast: Secondary | ICD-10-CM

## 2017-01-16 LAB — CBC WITH DIFFERENTIAL/PLATELET
BASOS ABS: 0.1 10*3/uL (ref 0–0.1)
Basophils Relative: 1 %
EOS PCT: 10 %
Eosinophils Absolute: 1.3 10*3/uL — ABNORMAL HIGH (ref 0–0.7)
HCT: 37.9 % — ABNORMAL LOW (ref 40.0–52.0)
HEMOGLOBIN: 13.2 g/dL (ref 13.0–18.0)
LYMPHS ABS: 1.8 10*3/uL (ref 1.0–3.6)
LYMPHS PCT: 13 %
MCH: 29.7 pg (ref 26.0–34.0)
MCHC: 34.8 g/dL (ref 32.0–36.0)
MCV: 85.3 fL (ref 80.0–100.0)
Monocytes Absolute: 0.8 10*3/uL (ref 0.2–1.0)
Monocytes Relative: 6 %
NEUTROS PCT: 70 %
Neutro Abs: 9.8 10*3/uL — ABNORMAL HIGH (ref 1.4–6.5)
PLATELETS: 280 10*3/uL (ref 150–440)
RBC: 4.44 MIL/uL (ref 4.40–5.90)
RDW: 13 % (ref 11.5–14.5)
WBC: 13.9 10*3/uL — AB (ref 3.8–10.6)

## 2017-01-16 LAB — COMPREHENSIVE METABOLIC PANEL
ALK PHOS: 68 U/L (ref 38–126)
ALT: 26 U/L (ref 17–63)
AST: 28 U/L (ref 15–41)
Albumin: 3.5 g/dL (ref 3.5–5.0)
Anion gap: 12 (ref 5–15)
BUN: 37 mg/dL — AB (ref 6–20)
CHLORIDE: 104 mmol/L (ref 101–111)
CO2: 22 mmol/L (ref 22–32)
CREATININE: 2.46 mg/dL — AB (ref 0.61–1.24)
Calcium: 9.3 mg/dL (ref 8.9–10.3)
GFR calc Af Amer: 30 mL/min — ABNORMAL LOW (ref >60)
GFR, EST NON AFRICAN AMERICAN: 26 mL/min — AB (ref >60)
Glucose, Bld: 152 mg/dL — ABNORMAL HIGH (ref 65–99)
Potassium: 3.9 mmol/L (ref 3.5–5.1)
SODIUM: 138 mmol/L (ref 135–145)
Total Bilirubin: 0.4 mg/dL (ref 0.3–1.2)
Total Protein: 7.4 g/dL (ref 6.5–8.1)

## 2017-01-16 NOTE — Progress Notes (Signed)
[place] NOTE  Patient Care Team: Dion Body, MD as PCP - General (Family Medicine)  CHIEF COMPLAINTS/PURPOSE OF CONSULTATION: RECTAL CANCER  #  Oncology History   # 2012- RECTO-SIGMOID STAGE III CA [s/p neo-adj chemo-RT]; APR [Dr.Smith]  S/p FOLFOX   # SEP 2019- Pre-sacral fluid/mass- increasing ~ 5-6 cm [increased from dec 2017; also NEW retroperitoneal lymph nodes; inguinal adenopathy]  # LEFT LE DVT [? May Thurner's- Dr.Schneir]; Eliquis.   # CHF/COPD/CKD     Rectal cancer ([place])    HISTORY OF PRESENTING ILLNESS:  Donald Townsend 66 y.o.  male  above history of rectal cancer 2012 stage III status post resection followed by adjuvant therapy has been referred to [place] for concerns of recurrent rectal cancer.  Patient was diagnosed with rectosigmoid cancer in 2012 on a surveillance colonoscopy. He underwent chemoradiation followed by surgery followed by adjuvant chemotherapy. He tolerated the treatments fairly well except for grade 1-2 neuropathy.  Patient most recently evaluated with a abdominal pelvic CT scan for a DVT of his left lower extremity. This led to incidental finding of a worsening presacral fluid collection/mass and also new-onset of retroperitoneal lymph nodes and also inguinal lymph nodes. Patient also noted to have bilateral worsening hydronephrosis. He was evaluated by urology; he has been referred to [place] for further evaluation and recommendations given his history of rectal cancer/also the concerning findings on the CAT scan.  Patient denies any pain. His chronic mild swelling of the left lower extremity. Chronic tingling and numbness of his bilateral lower extremities. Mild chronic shortness of breath-CHF. He is on diuretics.   ROS: A complete 10 point review of system is done which is negative except mentioned above in history of present illness  MEDICAL HISTORY:  Past Medical History:  Diagnosis Date  . A-fib ([place])   . Cancer  [facility])    Colon  . CHF (congestive heart failure) ([place])   . Diabetes mellitus without complication ([place])   . Dyspnea   . Elevated lipids   . Hypertension   . PVD (peripheral vascular disease) ([name])     SURGICAL HISTORY: Past Surgical History:  Procedure Laterality Date  . COLON RESECTION     with colostomy  . COLONOSCOPY    . IVC FILTER REMOVAL N/A 08/28/2016   Procedure: IVC Filter Removal;  Surgeon: Katha Cabal, MD;  Location: [place] [facility];  Service: Cardiovascular;  Laterality: N/A;  . LOWER EXTREMITY VENOGRAPHY Left 12/18/2016   Procedure: Lower Extremity Venography;  Surgeon: Katha Cabal, MD;  Location: [place] [facility];  Service: Cardiovascular;  Laterality: Left;  . PERIPHERAL VASCULAR CATHETERIZATION Left 05/08/2016   Procedure: Lower Extremity Venography;  Surgeon: Katha Cabal, MD;  Location: [place] [facility];  Service: Cardiovascular;  Laterality: Left;    SOCIAL HISTORY: [place]; retdOccupational psychologist; currently works part time. He lives alone. No smoking occasional alcohol. Social History   Social History  . Marital status: Divorced    Spouse name: N/A  . Number of children: N/A  . Years of education: N/A   Occupational History  . Not on file.   Social History Main Topics  . Smoking status: Never Smoker  . Smokeless tobacco: Never Used  . Alcohol use No  . Drug use: No  . Sexual activity: Not on file   Other Topics Concern  . Not on file   Social History Narrative  . No narrative on file    FAMILY HISTORY: Family  History  Problem Relation Age of Onset  . Diabetes Mother   . Stroke Mother   . Prostate cancer Brother   . Cancer Sister        Breast cancer  . Kidney cancer Neg Hx   . Bladder Cancer Neg Hx     ALLERGIES:  has No Known Allergies.  MEDICATIONS:  No current facility-administered medications for this visit.    No current outpatient prescriptions on file.   Facility-Administered Medications Ordered in  Other Visits  Medication Dose Route Frequency Provider Last Rate Last Dose  . 0.9 %  sodium chloride infusion   Intravenous Continuous Gunnar Bulla, MD 50 mL/hr at 01/18/17 1111    . fentaNYL (SUBLIMAZE) injection 25 mcg  25 mcg Intravenous Q5 min PRN Gunnar Bulla, MD   25 mcg at 01/18/17 1335  . ondansetron (ZOFRAN) injection 4 mg  4 mg Intravenous Once PRN Gunnar Bulla, MD          .  PHYSICAL EXAMINATION: ECOG PERFORMANCE STATUS: 0 - Asymptomatic  Vitals:   01/16/17 1523  BP: (!) 143/74  Pulse: 77  Resp: 16  Temp: 98.1 F (36.7 C)   Filed Weights   01/16/17 1523  Weight: 261 lb (118.4 kg)    GENERAL: Well-nourished well-developed; Alert, no distress and comfortable.   With his daughter.  EYES: no pallor or icterus OROPHARYNX: no thrush or ulceration; good dentition  NECK: supple, no masses felt LYMPH:  no palpable lymphadenopathy in the cervical, axillary or inguinal regions LUNGS: clear to auscultation and  No wheeze or crackles HEART/CVS: regular rate & rhythm and no murmurs; No lower extremity edema ABDOMEN: abdomen soft, non-tender and normal bowel sounds; colostomy in place.  Musculoskeletal:no cyanosis of digits and no clubbing  PSYCH: alert & oriented x 3 with fluent speech NEURO: no focal motor/sensory deficits SKIN:  no rashes or significant lesions  LABORATORY DATA:  I have reviewed the data as listed Lab Results  Component Value Date   WBC 13.9 (H) 01/16/2017   HGB 13.2 01/16/2017   HCT 37.9 (L) 01/16/2017   MCV 85.3 01/16/2017   PLT 280 01/16/2017    Recent Labs  04/19/16 1407  12/17/16 0859 01/01/17 1621 01/16/17 1615  NA 138  --  140  --  138  K 3.9  --  4.4  --  3.9  CL 103  --  102  --  104  CO2 25  --  28  --  22  GLUCOSE 105*  --  172*  --  152*  BUN 24*  < > 22* 21 37*  CREATININE 1.07  < > 1.39* 1.82* 2.46*  CALCIUM 9.5  --  9.8  --  9.3  GFRNONAA >60  < > 52* 38* 26*  GFRAA >60  < > 60* 44* 30*  PROT 7.3  --   --   --   7.4  ALBUMIN 3.7  --   --   --  3.5  AST 32  --   --   --  28  ALT 24  --   --   --  26  ALKPHOS 46  --   --   --  68  BILITOT 0.8  --   --   --  0.4  < > = values in this interval not displayed.  RADIOGRAPHIC STUDIES: I have personally reviewed the radiological images as listed and agreed with the findings in the report. Ct Hematuria Workup  Result Date:  01/11/2017 CLINICAL DATA:  Followup colon cancer. EXAM: CT ABDOMEN AND PELVIS WITHOUT AND WITH CONTRAST TECHNIQUE: Multidetector CT imaging of the abdomen and pelvis was performed following the standard protocol before and following the bolus administration of intravenous contrast. CONTRAST:  165mL ISOVUE-300 IOPAMIDOL (ISOVUE-300) INJECTION 61% COMPARISON:  CT scan 04/27/2016 and 08/24/2014 FINDINGS: Lower chest: The lung bases are clear of acute process. No pleural effusion or pulmonary lesions. The heart is normal in size. No pericardial effusion. Stable coronary artery calcifications. The distal esophagus and aorta are unremarkable. Hepatobiliary: No focal hepatic lesions or intrahepatic biliary dilatation the gallbladder appears normal. No common bile duct dilatation. Pancreas: No mass, inflammation or ductal dilatation. Spleen: Normal size.  No focal lesions. Adrenals/Urinary Tract: The adrenal glands are normal in stable. Bilateral hydroureteronephrosis down to enlarging pelvic mass. The right-sided hydroureteronephrosis is new and the left side is progressive. The bladder contains a Foley catheter. There is diffuse asymmetric bladder wall thickening which appears progressive. The delayed images demonstrate compression but not complete obstruction of the right ureter. Stomach/Bowel: The stomach, duodenum, small bowel and colon are grossly normal without oral contrast. No acute inflammatory changes or obstructive findings. Stable left lower quadrant colostomy. Vascular/Lymphatic: Stable atherosclerotic calcifications involving the aorta and iliac  arteries. No aneurysm. New retroperitoneal lymphadenopathy. 15 mm periaortic lymph node on image number 38. 14 mm right-sided retroperitoneal lymph node posterior to the IVC on image number 41. Left para-aortic lymph node on image number 50 measures 13.5 mm. A left common iliac vein stent is noted. Reproductive: The prostate gland is grossly normal. The seminal vesicles are difficult to identified. Other: Enlarging presacral soft tissue mass which appears confluent with the base of the bladder. Central fluid collection appears stable. The presacral tumor is extending higher up into the pelvis where it is likely obstructing both ureters. Bilateral inguinal lymphadenopathy is new. Index node on the right side on image number 94 measures 14.5 mm an index node on the left side on image number 102 measures 17 mm. Musculoskeletal: No definite metastatic osseous disease. IMPRESSION: 1. 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 New right-sided hydroureteronephrosis with compression of the distal ureter but no complete obstruction. The left ureter appears completely obstructed. 2. New retroperitoneal lymphadenopathy. 3. No findings for bowel obstruction. Left lower quadrant colostomy is stable. 4. No definite CT findings for hepatic metastatic disease. 5. New bilateral inguinal lymphadenopathy. Electronically Signed   By: Marijo Sanes M.D.   On: 01/11/2017 13:18    ASSESSMENT & PLAN:   Rectal cancer [facility]) # Rectal cancer 2012 stage III- status post chemoradiation followed by surgery followed by adjuvant chemotherapy. CT scan September 2018 shows progressive presacral fluid collection; bilateral inguinal lymphadenopathy pelvic and also retroperitoneal adenopathy- highly concerning for recurrent disease. However biopsy needed to confirm. Recommend a PET scan to help guide biopsy. Bx of ingiunal LN.   # Acute renal insufficiency- secondary  to bilateral hydronephrosis from the underlying pelvic mass. Discussed with urology; will plan urgent stent placement versus nephrostomy tube placement.  # Patient will also need a port placement.  # Will get labs today [facility] CMP CEA]; we'll also review the tumor conference.  # CHF-compensated stable.  # Acute DVT Left - on Eliquis.   Thank you Dr.Linthavong for allowing me to participate in the care of your pleasant patient. Please do not hesitate to contact me with questions or concerns in the interim.  #  I reviewed the blood work- with the patient in detail; also reviewed the imaging independently [as summarized above]; and with the patient in detail.   Addendum: Reviewed with the patient's daughter- CEA elevated 1:30; worsening renal function discussed the tumor conference; awaiting urology repeat evaluation. Follow up few days after the biopsy. Daughter agrees with the plan.   All questions were answered. The patient knows to call the clinic with any problems, questions or concerns.     Cammie Sickle, MD 01/18/2017 2:48 PM

## 2017-01-16 NOTE — Assessment & Plan Note (Addendum)
#   Rectal cancer 2012 stage III- status post chemoradiation followed by surgery followed by adjuvant chemotherapy. CT scan September 2018 shows progressive presacral fluid collection; bilateral inguinal lymphadenopathy pelvic and also retroperitoneal adenopathy- highly concerning for recurrent disease. However biopsy needed to confirm. Recommend a PET scan to help guide biopsy. Bx of ingiunal LN.   # Acute renal insufficiency- secondary to bilateral hydronephrosis from the underlying pelvic mass. Discussed with urology; will plan urgent stent placement versus nephrostomy tube placement.  # Patient will also need a port placement.  # Will get labs today [facility] CMP CEA]; we'll also review the tumor conference.  # CHF-compensated stable.  # Acute DVT Left - on Eliquis.   Thank you Dr.Linthavong for allowing me to participate in the care of your pleasant patient. Please do not hesitate to contact me with questions or concerns in the interim.  # I reviewed the blood work- with the patient in detail; also reviewed the imaging independently [as summarized above]; and with the patient in detail.   Addendum: Reviewed with the patient's daughter- CEA elevated 1:30; worsening renal function discussed the tumor conference; awaiting urology repeat evaluation. Follow up few days after the biopsy. Daughter agrees with the plan.

## 2017-01-17 ENCOUNTER — Other Ambulatory Visit: Payer: Self-pay | Admitting: Radiology

## 2017-01-17 ENCOUNTER — Other Ambulatory Visit: Payer: Medicare Other

## 2017-01-17 ENCOUNTER — Encounter: Payer: Self-pay | Admitting: Radiology

## 2017-01-17 ENCOUNTER — Telehealth: Payer: Self-pay | Admitting: Internal Medicine

## 2017-01-17 DIAGNOSIS — C20 Malignant neoplasm of rectum: Secondary | ICD-10-CM

## 2017-01-17 DIAGNOSIS — R590 Localized enlarged lymph nodes: Secondary | ICD-10-CM | POA: Insufficient documentation

## 2017-01-17 DIAGNOSIS — N131 Hydronephrosis with ureteral stricture, not elsewhere classified: Secondary | ICD-10-CM

## 2017-01-17 LAB — CEA: CEA: 132.4 ng/mL — ABNORMAL HIGH (ref 0.0–4.7)

## 2017-01-17 NOTE — Telephone Encounter (Signed)
Donald Townsend- please follow up; and set up appt with me 2-3 days after the biopsy.   Spoke to patient's daughter-Heather, regarding the discussion at the tumor conference. She will convey to her father.  # urgent evaluation with urology. Discussed with Zara Council.   # Plan lymph node biopsy- inguinal lymph node [after the PET scan done on 9/25 ]  # Pt will need to hold off eliquis [for DVT] 3 days prior to procedures.  # also need port placement.  # will discuss with Dr.Smith when available.

## 2017-01-18 ENCOUNTER — Encounter
Admission: RE | Disposition: A | Discharge status: Home / Self Care | Payer: Self-pay | Source: Ambulatory Visit | Attending: Internal Medicine

## 2017-01-18 ENCOUNTER — Inpatient Hospital Stay
Admission: RE | Admit: 2017-01-18 | Discharge: 2017-01-21 | DRG: 683 | Disposition: A | Discharge status: Home / Self Care | Payer: Medicare Other | Source: Ambulatory Visit | Attending: Internal Medicine | Admitting: Internal Medicine

## 2017-01-18 ENCOUNTER — Ambulatory Visit: Payer: Medicare Other | Admitting: Anesthesiology

## 2017-01-18 ENCOUNTER — Other Ambulatory Visit: Payer: Self-pay | Admitting: *Deleted

## 2017-01-18 ENCOUNTER — Inpatient Hospital Stay: Payer: Medicare Other

## 2017-01-18 ENCOUNTER — Encounter: Payer: Self-pay | Admitting: Emergency Medicine

## 2017-01-18 DIAGNOSIS — N131 Hydronephrosis with ureteral stricture, not elsewhere classified: Secondary | ICD-10-CM | POA: Diagnosis not present

## 2017-01-18 DIAGNOSIS — D649 Anemia, unspecified: Secondary | ICD-10-CM | POA: Diagnosis present

## 2017-01-18 DIAGNOSIS — E1151 Type 2 diabetes mellitus with diabetic peripheral angiopathy without gangrene: Secondary | ICD-10-CM | POA: Diagnosis present

## 2017-01-18 DIAGNOSIS — C7911 Secondary malignant neoplasm of bladder: Secondary | ICD-10-CM | POA: Diagnosis present

## 2017-01-18 DIAGNOSIS — Z794 Long term (current) use of insulin: Secondary | ICD-10-CM | POA: Diagnosis not present

## 2017-01-18 DIAGNOSIS — I4891 Unspecified atrial fibrillation: Secondary | ICD-10-CM | POA: Diagnosis not present

## 2017-01-18 DIAGNOSIS — Z7901 Long term (current) use of anticoagulants: Secondary | ICD-10-CM

## 2017-01-18 DIAGNOSIS — Z823 Family history of stroke: Secondary | ICD-10-CM

## 2017-01-18 DIAGNOSIS — N179 Acute kidney failure, unspecified: Secondary | ICD-10-CM | POA: Diagnosis not present

## 2017-01-18 DIAGNOSIS — Z9049 Acquired absence of other specified parts of digestive tract: Secondary | ICD-10-CM

## 2017-01-18 DIAGNOSIS — I482 Chronic atrial fibrillation: Secondary | ICD-10-CM | POA: Diagnosis present

## 2017-01-18 DIAGNOSIS — Z85048 Personal history of other malignant neoplasm of rectum, rectosigmoid junction, and anus: Secondary | ICD-10-CM | POA: Diagnosis not present

## 2017-01-18 DIAGNOSIS — Z933 Colostomy status: Secondary | ICD-10-CM | POA: Diagnosis not present

## 2017-01-18 DIAGNOSIS — Z7982 Long term (current) use of aspirin: Secondary | ICD-10-CM | POA: Diagnosis not present

## 2017-01-18 DIAGNOSIS — R1909 Other intra-abdominal and pelvic swelling, mass and lump: Secondary | ICD-10-CM | POA: Diagnosis not present

## 2017-01-18 DIAGNOSIS — I509 Heart failure, unspecified: Secondary | ICD-10-CM | POA: Diagnosis not present

## 2017-01-18 DIAGNOSIS — I110 Hypertensive heart disease with heart failure: Secondary | ICD-10-CM | POA: Diagnosis present

## 2017-01-18 DIAGNOSIS — R1900 Intra-abdominal and pelvic swelling, mass and lump, unspecified site: Secondary | ICD-10-CM | POA: Diagnosis present

## 2017-01-18 DIAGNOSIS — G629 Polyneuropathy, unspecified: Secondary | ICD-10-CM | POA: Diagnosis not present

## 2017-01-18 DIAGNOSIS — R599 Enlarged lymph nodes, unspecified: Secondary | ICD-10-CM | POA: Diagnosis not present

## 2017-01-18 DIAGNOSIS — Z833 Family history of diabetes mellitus: Secondary | ICD-10-CM | POA: Diagnosis not present

## 2017-01-18 DIAGNOSIS — E119 Type 2 diabetes mellitus without complications: Secondary | ICD-10-CM | POA: Diagnosis not present

## 2017-01-18 DIAGNOSIS — C189 Malignant neoplasm of colon, unspecified: Secondary | ICD-10-CM | POA: Diagnosis present

## 2017-01-18 DIAGNOSIS — Z803 Family history of malignant neoplasm of breast: Secondary | ICD-10-CM

## 2017-01-18 DIAGNOSIS — N1330 Unspecified hydronephrosis: Secondary | ICD-10-CM | POA: Diagnosis not present

## 2017-01-18 DIAGNOSIS — R338 Other retention of urine: Secondary | ICD-10-CM | POA: Diagnosis not present

## 2017-01-18 DIAGNOSIS — I10 Essential (primary) hypertension: Secondary | ICD-10-CM | POA: Diagnosis not present

## 2017-01-18 DIAGNOSIS — C20 Malignant neoplasm of rectum: Secondary | ICD-10-CM

## 2017-01-18 DIAGNOSIS — Z8042 Family history of malignant neoplasm of prostate: Secondary | ICD-10-CM | POA: Diagnosis not present

## 2017-01-18 DIAGNOSIS — R319 Hematuria, unspecified: Secondary | ICD-10-CM | POA: Diagnosis present

## 2017-01-18 DIAGNOSIS — I82402 Acute embolism and thrombosis of unspecified deep veins of left lower extremity: Secondary | ICD-10-CM | POA: Diagnosis not present

## 2017-01-18 DIAGNOSIS — Z86718 Personal history of other venous thrombosis and embolism: Secondary | ICD-10-CM | POA: Diagnosis not present

## 2017-01-18 DIAGNOSIS — N132 Hydronephrosis with renal and ureteral calculous obstruction: Secondary | ICD-10-CM | POA: Diagnosis not present

## 2017-01-18 DIAGNOSIS — R0602 Shortness of breath: Secondary | ICD-10-CM | POA: Diagnosis not present

## 2017-01-18 DIAGNOSIS — R970 Elevated carcinoembryonic antigen [CEA]: Secondary | ICD-10-CM | POA: Diagnosis not present

## 2017-01-18 DIAGNOSIS — N135 Crossing vessel and stricture of ureter without hydronephrosis: Secondary | ICD-10-CM | POA: Diagnosis not present

## 2017-01-18 HISTORY — PX: CYSTOSCOPY WITH STENT PLACEMENT: SHX5790

## 2017-01-18 LAB — HEMOGLOBIN AND HEMATOCRIT, BLOOD
HCT: 33.8 % — ABNORMAL LOW (ref 40.0–52.0)
HEMATOCRIT: 35.5 % — AB (ref 40.0–52.0)
HEMOGLOBIN: 12 g/dL — AB (ref 13.0–18.0)
Hemoglobin: 12.3 g/dL — ABNORMAL LOW (ref 13.0–18.0)

## 2017-01-18 LAB — GLUCOSE, CAPILLARY
GLUCOSE-CAPILLARY: 112 mg/dL — AB (ref 65–99)
Glucose-Capillary: 107 mg/dL — ABNORMAL HIGH (ref 65–99)
Glucose-Capillary: 139 mg/dL — ABNORMAL HIGH (ref 65–99)

## 2017-01-18 SURGERY — CYSTOSCOPY, WITH STENT INSERTION
Anesthesia: General | Laterality: Bilateral | Wound class: Clean

## 2017-01-18 MED ORDER — METOPROLOL TARTRATE 25 MG PO TABS
25.0000 mg | ORAL_TABLET | Freq: Two times a day (BID) | ORAL | Status: DC
  Administered 2017-01-18 – 2017-01-21 (×5): 25 mg via ORAL
  Filled 2017-01-18 (×5): qty 1

## 2017-01-18 MED ORDER — ONDANSETRON HCL 4 MG PO TABS: 4.0000 mg | ORAL_TABLET | Freq: Four times a day (QID) | ORAL | Status: DC | PRN

## 2017-01-18 MED ORDER — CEFAZOLIN SODIUM-DEXTROSE 1-4 GM/50ML-% IV SOLN
INTRAVENOUS | Status: AC
Start: 2017-01-18 — End: 2017-01-18
  Filled 2017-01-18: qty 50

## 2017-01-18 MED ORDER — CEFAZOLIN SODIUM-DEXTROSE 1-4 GM/50ML-% IV SOLN
1.0000 g | INTRAVENOUS | Status: AC
  Administered 2017-01-18: 1 g via INTRAVENOUS

## 2017-01-18 MED ORDER — HYDROCODONE-ACETAMINOPHEN 5-325 MG PO TABS
1.0000 | ORAL_TABLET | ORAL | Status: DC | PRN
  Administered 2017-01-18 – 2017-01-21 (×11): 1 via ORAL
  Filled 2017-01-18 (×12): qty 1

## 2017-01-18 MED ORDER — FENTANYL CITRATE (PF) 100 MCG/2ML IJ SOLN
25.0000 ug | INTRAMUSCULAR | Status: DC | PRN
  Administered 2017-01-18 (×2): 25 ug via INTRAVENOUS

## 2017-01-18 MED ORDER — METOPROLOL TARTRATE 5 MG/5ML IV SOLN: 5.0000 mg | INTRAVENOUS | Status: DC | PRN

## 2017-01-18 MED ORDER — INSULIN ASPART 100 UNIT/ML ~~LOC~~ SOLN
0.0000 [IU] | Freq: Three times a day (TID) | SUBCUTANEOUS | Status: DC
  Administered 2017-01-19: 1 [IU] via SUBCUTANEOUS
  Administered 2017-01-20: 2 [IU] via SUBCUTANEOUS
  Administered 2017-01-20: 1 [IU] via SUBCUTANEOUS
  Filled 2017-01-18 (×3): qty 1

## 2017-01-18 MED ORDER — PROPOFOL 10 MG/ML IV BOLUS
INTRAVENOUS | Status: DC | PRN
Start: 2017-01-18 — End: 2017-01-18
  Administered 2017-01-18: 150 mg via INTRAVENOUS
  Administered 2017-01-18: 50 mg via INTRAVENOUS

## 2017-01-18 MED ORDER — TAMSULOSIN HCL 0.4 MG PO CAPS
0.4000 mg | ORAL_CAPSULE | Freq: Every day | ORAL | Status: DC
  Administered 2017-01-18 – 2017-01-21 (×4): 0.4 mg via ORAL
  Filled 2017-01-18 (×4): qty 1

## 2017-01-18 MED ORDER — PROPOFOL 10 MG/ML IV BOLUS
INTRAVENOUS | Status: AC
  Filled 2017-01-18: qty 20

## 2017-01-18 MED ORDER — ONDANSETRON HCL 4 MG/2ML IJ SOLN: 4.0000 mg | Freq: Once | INTRAMUSCULAR | Status: DC | PRN

## 2017-01-18 MED ORDER — FAMOTIDINE 20 MG PO TABS
ORAL_TABLET | ORAL | Status: AC
  Administered 2017-01-18: 20 mg via ORAL
  Filled 2017-01-18: qty 1

## 2017-01-18 MED ORDER — CEFAZOLIN SODIUM-DEXTROSE 2-4 GM/100ML-% IV SOLN
2.0000 g | INTRAVENOUS | Status: AC
  Administered 2017-01-19: 2 g via INTRAVENOUS
  Filled 2017-01-18: qty 100

## 2017-01-18 MED ORDER — LACTATED RINGERS IV SOLN
INTRAVENOUS | Status: DC | PRN
  Administered 2017-01-18: 12:00:00 via INTRAVENOUS

## 2017-01-18 MED ORDER — FENTANYL CITRATE (PF) 100 MCG/2ML IJ SOLN
INTRAMUSCULAR | Status: AC
  Filled 2017-01-18: qty 2

## 2017-01-18 MED ORDER — ACETAMINOPHEN 650 MG RE SUPP: 650.0000 mg | Freq: Four times a day (QID) | RECTAL | Status: DC | PRN

## 2017-01-18 MED ORDER — FAMOTIDINE 20 MG PO TABS
20.0000 mg | ORAL_TABLET | Freq: Once | ORAL | Status: AC
  Administered 2017-01-18: 20 mg via ORAL

## 2017-01-18 MED ORDER — ACETAMINOPHEN 325 MG PO TABS: 650.0000 mg | ORAL_TABLET | Freq: Four times a day (QID) | ORAL | Status: DC | PRN

## 2017-01-18 MED ORDER — LIDOCAINE HCL (CARDIAC) 20 MG/ML IV SOLN
INTRAVENOUS | Status: DC | PRN
  Administered 2017-01-18: 100 mg via INTRAVENOUS

## 2017-01-18 MED ORDER — SODIUM CHLORIDE 0.9 % IV SOLN
INTRAVENOUS | Status: DC
  Administered 2017-01-18 – 2017-01-20 (×4): via INTRAVENOUS

## 2017-01-18 MED ORDER — FENTANYL CITRATE (PF) 100 MCG/2ML IJ SOLN
INTRAMUSCULAR | Status: AC
  Administered 2017-01-18: 25 ug via INTRAVENOUS
  Filled 2017-01-18: qty 2

## 2017-01-18 MED ORDER — ONDANSETRON HCL 4 MG/2ML IJ SOLN
INTRAMUSCULAR | Status: DC | PRN
  Administered 2017-01-18: 4 mg via INTRAVENOUS

## 2017-01-18 MED ORDER — FENTANYL CITRATE (PF) 100 MCG/2ML IJ SOLN
INTRAMUSCULAR | Status: DC | PRN
  Administered 2017-01-18 (×2): 50 ug via INTRAVENOUS

## 2017-01-18 MED ORDER — PRAVASTATIN SODIUM 20 MG PO TABS
20.0000 mg | ORAL_TABLET | Freq: Every day | ORAL | Status: DC
  Administered 2017-01-18 – 2017-01-20 (×3): 20 mg via ORAL
  Filled 2017-01-18 (×3): qty 1

## 2017-01-18 MED ORDER — MIDAZOLAM HCL 2 MG/2ML IJ SOLN
INTRAMUSCULAR | Status: AC
  Filled 2017-01-18: qty 2

## 2017-01-18 MED ORDER — ONDANSETRON HCL 4 MG/2ML IJ SOLN: 4.0000 mg | Freq: Four times a day (QID) | INTRAMUSCULAR | Status: DC | PRN

## 2017-01-18 SURGICAL SUPPLY — 20 items
BACTOSHIELD CHG 4% 4OZ (MISCELLANEOUS) ×2
BAG URINE DRAINAGE (UROLOGICAL SUPPLIES) ×3 IMPLANT
CATH COUDE FOLEY 2W 5CC 16FR (CATHETERS) ×3 IMPLANT
GLOVE BIO SURGEON STRL SZ7.5 (GLOVE) ×3 IMPLANT
GOWN STRL REUS W/ TWL LRG LVL4 (GOWN DISPOSABLE) ×1 IMPLANT
GOWN STRL REUS W/TWL LRG LVL4 (GOWN DISPOSABLE) ×2
GOWN STRL REUS W/TWL XL LVL4 (GOWN DISPOSABLE) ×3 IMPLANT
KIT RM TURNOVER CYSTO AR (KITS) ×3 IMPLANT
PACK CYSTO AR (MISCELLANEOUS) ×3 IMPLANT
SCRUB CHG 4% DYNA-HEX 4OZ (MISCELLANEOUS) ×1 IMPLANT
SENSORWIRE 0.038 NOT ANGLED (WIRE) ×3
SET CYSTO W/LG BORE CLAMP LF (SET/KITS/TRAYS/PACK) ×3 IMPLANT
SOL .9 NS 3000ML IRR  AL (IV SOLUTION) ×2
SOL .9 NS 3000ML IRR UROMATIC (IV SOLUTION) ×1 IMPLANT
STENT URET 6FRX24 CONTOUR (STENTS) IMPLANT
STENT URET 6FRX26 CONTOUR (STENTS) IMPLANT
SURGILUBE 2OZ TUBE FLIPTOP (MISCELLANEOUS) ×3 IMPLANT
SYR 10ML LL (SYRINGE) ×3 IMPLANT
WATER STERILE IRR 1000ML POUR (IV SOLUTION) ×3 IMPLANT
WIRE SENSOR 0.038 NOT ANGLED (WIRE) ×1 IMPLANT

## 2017-01-18 NOTE — Progress Notes (Signed)
Per Dr Pilar Jarvis order norco for pain

## 2017-01-18 NOTE — Consult Note (Signed)
[facility] CONSULT NOTE  Patient Care Team: Dion Body, MD as PCP - General (Family Medicine)  CHIEF COMPLAINTS/PURPOSE OF CONSULTATION:  Rectal cancer  HISTORY OF PRESENTING ILLNESS:  Donald Townsend 66 y.o.  male history of rectal cancer 2012 stage III status post resection followed by adjuvant therapy- with likely recurrence based on imaging; and also noted to have bilateral ureteral obstruction.  A recent CT scan showed worsening presacral fluid collection/mass and also new-onset of retroperitoneal lymph nodes and also inguinal lymph nodes. Patient also noted to have bilateral worsening hydronephrosis. He was evaluated by urology; attempt was made to bilateral stents; however unsuccessful. Patient is admitted to the hospital for- management of acute renal failure; and also bilateral percutaneous nephrostomy tubes.  Currently patient denies any pain. Chronic tingling and numbness of his bilateral lower extremities. Mild chronic shortness of breath-CHF. He is on diuretics.   ROS: A complete 10 point review of system is done which is negative except mentioned above in history of present illness  MEDICAL HISTORY:  Past Medical History:  Diagnosis Date  . A-fib ([place])   . Cancer [facility])    Colon  . CHF (congestive heart failure) ([place])   . Diabetes mellitus without complication ([place])   . Dyspnea   . Elevated lipids   . Hypertension   . PVD (peripheral vascular disease) ([place])     SURGICAL HISTORY: Past Surgical History:  Procedure Laterality Date  . COLON RESECTION     with colostomy  . COLONOSCOPY    . IR NEPHROSTOMY PLACEMENT LEFT  01/19/2017  . IR NEPHROSTOMY PLACEMENT RIGHT  01/19/2017  . IVC FILTER REMOVAL N/A 08/28/2016   Procedure: IVC Filter Removal;  Surgeon: Katha Cabal, MD;  Location: [facility];  Service: Cardiovascular;  Laterality: N/A;  . LOWER EXTREMITY VENOGRAPHY Left 12/18/2016   Procedure: Lower Extremity Venography;   Surgeon: Katha Cabal, MD;  Location: [facility];  Service: Cardiovascular;  Laterality: Left;  . PERIPHERAL VASCULAR CATHETERIZATION Left 05/08/2016   Procedure: Lower Extremity Venography;  Surgeon: Katha Cabal, MD;  Location: [place] [facility];  Service: Cardiovascular;  Laterality: Left;    SOCIAL HISTORY: Social History   Social History  . Marital status: Divorced    Spouse name: N/A  . Number of children: N/A  . Years of education: N/A   Occupational History  . Not on file.   Social History Main Topics  . Smoking status: Never Smoker  . Smokeless tobacco: Never Used  . Alcohol use No  . Drug use: No  . Sexual activity: Not on file   Other Topics Concern  . Not on file   Social History Narrative  . No narrative on file    FAMILY HISTORY: Family History  Problem Relation Age of Onset  . Diabetes Mother   . Stroke Mother   . Prostate cancer Brother   . Cancer Sister        Breast cancer  . Kidney cancer Neg Hx   . Bladder Cancer Neg Hx     ALLERGIES:  has No Known Allergies.  MEDICATIONS:  Current Facility-Administered Medications  Medication Dose Route Frequency Provider Last Rate Last Dose  . 0.9 %  sodium chloride infusion   Intravenous Continuous Gunnar Bulla, MD 50 mL/hr at 01/19/17 [phone]    . acetaminophen (TYLENOL) tablet 650 mg  650 mg Oral Q6H PRN Nicholes Mango, MD       Or  .  acetaminophen (TYLENOL) suppository 650 mg  650 mg Rectal Q6H PRN Gouru, Aruna, MD      . HYDROcodone-acetaminophen (NORCO/VICODIN) 5-325 MG per tablet 1 tablet  1 tablet Oral Q4H PRN [name], MD   1 tablet at 01/19/17 1645  . insulin aspart (novoLOG) injection 0-9 Units  0-9 Units Subcutaneous TID WC Gouru, Aruna, MD   1 Units at 01/19/17 1645  . iopamidol (ISOVUE-300) 61 % injection 30 mL  30 mL Intravenous Once PRN Aletta Edouard, MD      . metoprolol tartrate (LOPRESSOR) injection 5 mg  5 mg Intravenous Q4H PRN Gouru, Aruna, MD      .  metoprolol tartrate (LOPRESSOR) tablet 25 mg  25 mg Oral BID Nicholes Mango, MD   25 mg at 01/18/17 2139  . ondansetron (ZOFRAN) tablet 4 mg  4 mg Oral Q6H PRN Gouru, Aruna, MD       Or  . ondansetron (ZOFRAN) injection 4 mg  4 mg Intravenous Q6H PRN Gouru, Aruna, MD      . pravastatin (PRAVACHOL) tablet 20 mg  20 mg Oral q1800 Gouru, Aruna, MD   20 mg at 01/19/17 1645  . sodium chloride flush (NS) 0.9 % injection 5 mL  5 mL Intravenous Q8H Aletta Edouard, MD   5 mL at 01/19/17 1646  . tamsulosin (FLOMAX) capsule 0.4 mg  0.4 mg Oral Daily Gouru, Aruna, MD   0.4 mg at 01/19/17 1645      .  PHYSICAL EXAMINATION:  Vitals:   01/19/17 1609 01/19/17 1641  BP: (!) 159/94 (!) 164/77  Pulse: (!) 51 (!) 58  Resp: 16 16  Temp: 98.1 F (36.7 C) (!) 97.4 F (36.3 C)  SpO2: 100% 100%   Filed Weights   01/18/17 1100 01/18/17 1530 01/19/17 1243  Weight: 261 lb (118.4 kg) 257 lb 14.4 oz (117 kg) 257 lb (116.6 kg)    GENERAL: Well-nourished well-developed; Alert, no distress and comfortable.   He is accompanied by his son and daughter/family. EYES: no pallor or icterus OROPHARYNX: no thrush or ulceration. NECK: supple, no masses felt LYMPH:  no palpable lymphadenopathy in the cervical, axillary regions.1-2  centimeter lymph node felt bilateral inguinal region. LUNGS: decreased breath sounds to auscultation at bases and  No wheeze or crackles HEART/CVS: regular rate & rhythm and no murmurs; No lower extremity edema ABDOMEN: abdomen soft, non-tender and normal bowel sounds Musculoskeletal:no cyanosis of digits and no clubbing  PSYCH: alert & oriented x 3 with fluent speech NEURO: no focal motor/sensory deficits SKIN:  no rashes or significant lesions  LABORATORY DATA:  I have reviewed the data as listed Lab Results  Component Value Date   WBC 9.3 01/19/2017   HGB 11.5 (L) 01/19/2017   HCT 32.6 (L) 01/19/2017   MCV 84.9 01/19/2017   PLT 213 01/19/2017    Recent Labs  04/19/16 1407   12/17/16 0859 01/01/17 1621 01/16/17 1615 01/19/17 0404  NA 138  --  140  --  138 143  K 3.9  --  4.4  --  3.9 4.1  CL 103  --  102  --  104 113*  CO2 25  --  28  --  22 23  GLUCOSE 105*  --  172*  --  152* 115*  BUN 24*  < > 22* 21 37* 30*  CREATININE 1.07  < > 1.39* 1.82* 2.46* 2.35*  CALCIUM 9.5  --  9.8  --  9.3 8.2*  GFRNONAA >60  < >  52* 38* 26* 27*  GFRAA >60  < > 60* 44* 30* 32*  PROT 7.3  --   --   --  7.4 6.0*  ALBUMIN 3.7  --   --   --  3.5 2.8*  AST 32  --   --   --  28 20  ALT 24  --   --   --  26 15*  ALKPHOS 46  --   --   --  68 49  BILITOT 0.8  --   --   --  0.4 0.6  < > = values in this interval not displayed.  RADIOGRAPHIC STUDIES: I have personally reviewed the radiological images as listed and agreed with the findings in the report. Ct Hematuria Workup  Result Date: 01/11/2017 CLINICAL DATA:  Followup colon cancer. EXAM: CT ABDOMEN AND PELVIS WITHOUT AND WITH CONTRAST TECHNIQUE: Multidetector CT imaging of the abdomen and pelvis was performed following the standard protocol before and following the bolus administration of intravenous contrast. CONTRAST:  137mL ISOVUE-300 IOPAMIDOL (ISOVUE-300) INJECTION 61% COMPARISON:  CT scan 04/27/2016 and 08/24/2014 FINDINGS: Lower chest: The lung bases are clear of acute process. No pleural effusion or pulmonary lesions. The heart is normal in size. No pericardial effusion. Stable coronary artery calcifications. The distal esophagus and aorta are unremarkable. Hepatobiliary: No focal hepatic lesions or intrahepatic biliary dilatation the gallbladder appears normal. No common bile duct dilatation. Pancreas: No mass, inflammation or ductal dilatation. Spleen: Normal size.  No focal lesions. Adrenals/Urinary Tract: The adrenal glands are normal in stable. Bilateral hydroureteronephrosis down to enlarging pelvic mass. The right-sided hydroureteronephrosis is new and the left side is progressive. The bladder contains a Foley catheter.  There is diffuse asymmetric bladder wall thickening which appears progressive. The delayed images demonstrate compression but not complete obstruction of the right ureter. Stomach/Bowel: The stomach, duodenum, small bowel and colon are grossly normal without oral contrast. No acute inflammatory changes or obstructive findings. Stable left lower quadrant colostomy. Vascular/Lymphatic: Stable atherosclerotic calcifications involving the aorta and iliac arteries. No aneurysm. New retroperitoneal lymphadenopathy. 15 mm periaortic lymph node on image number 38. 14 mm right-sided retroperitoneal lymph node posterior to the IVC on image number 41. Left para-aortic lymph node on image number 50 measures 13.5 mm. A left common iliac vein stent is noted. Reproductive: The prostate gland is grossly normal. The seminal vesicles are difficult to identified. Other: Enlarging presacral soft tissue mass which appears confluent with the base of the bladder. Central fluid collection appears stable. The presacral tumor is extending higher up into the pelvis where it is likely obstructing both ureters. Bilateral inguinal lymphadenopathy is new. Index node on the right side on image number 94 measures 14.5 mm an index node on the left side on image number 102 measures 17 mm. Musculoskeletal: No definite metastatic osseous disease. IMPRESSION: 1. 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 New right-sided hydroureteronephrosis with compression of the distal ureter but no complete obstruction. The left ureter appears completely obstructed. 2. New retroperitoneal lymphadenopathy. 3. No findings for bowel obstruction. Left lower quadrant colostomy is stable. 4. No definite CT findings for hepatic metastatic disease. 5. New bilateral inguinal lymphadenopathy. Electronically Signed   By: Marijo Sanes M.D.   On: 01/11/2017 13:18   Ir Nephrostomy Placement  Left  Result Date: 01/19/2017 EXAM: IR NEPHROSTOMY PLACEMENT LEFT; IR NEPHROSTOMY PLACEMENT RIGHT COMPARISON:  None. MEDICATIONS: 2 g IV Ancef; The  antibiotic was administered in an appropriate time frame prior to skin puncture. ANESTHESIA/SEDATION: Fentanyl 100 mcg IV; Versed 3.0 mg IV Moderate Sedation Time:  60 minutes. The patient was continuously monitored during the procedure by the interventional radiology nurse under my direct supervision. CONTRAST:  10 mL Isovue-300 - administered into the collecting system(s) FLUOROSCOPY TIME:  Fluoroscopy Time: 1 minute.  (52 mGy). COMPLICATIONS: None immediate. PROCEDURE: Informed written consent was obtained from the patient after a thorough discussion of the procedural risks, benefits and alternatives. All questions were addressed. Maximal Sterile Barrier Technique was utilized including caps, mask, sterile gowns, sterile gloves, sterile drape, hand hygiene and skin antiseptic. A timeout was performed prior to the initiation of the procedure. Both kidneys were localized ultrasound. Under ultrasound guidance, access of the collecting systems was performed with 21 gauge needles. Transitional dilator is replaced. Tracts were dilated over guidewires and 10 French nephrostomy tubes were advanced bilaterally. Both tubes were injected with contrast to confirm position and fluoroscopic images saved. The catheters were connected to gravity bags and secured at the skin with Prolene retention sutures and StatLock devices. FINDINGS: There is significant hydronephrosis on the left. After lower pole access, a nephrostomy tube was advanced into the central collecting system and is draining well after placement. Right-sided hydronephrosis is present, to a lesser degree than on the left. Puncture of the right kidney was more difficult by ultrasound guidance, resulting in a more central puncture of the collecting system. A nephrostomy tube was advanced into the collecting system such  that it is partially formed and extends just into the proximal ureter. The tube is draining well after placement. IMPRESSION: Placement of bilateral percutaneous nephrostomy tubes. Bilateral 10 French nephrostomy tubes were placed and attached to gravity bag drainage. Electronically Signed   By: Aletta Edouard M.D.   On: 01/19/2017 15:11   Ir Nephrostomy Placement Right  Result Date: 01/19/2017 EXAM: IR NEPHROSTOMY PLACEMENT LEFT; IR NEPHROSTOMY PLACEMENT RIGHT COMPARISON:  None. MEDICATIONS: 2 g IV Ancef; The antibiotic was administered in an appropriate time frame prior to skin puncture. ANESTHESIA/SEDATION: Fentanyl 100 mcg IV; Versed 3.0 mg IV Moderate Sedation Time:  60 minutes. The patient was continuously monitored during the procedure by the interventional radiology nurse under my direct supervision. CONTRAST:  10 mL Isovue-300 - administered into the collecting system(s) FLUOROSCOPY TIME:  Fluoroscopy Time: 1 minute.  (52 mGy). COMPLICATIONS: None immediate. PROCEDURE: Informed written consent was obtained from the patient after a thorough discussion of the procedural risks, benefits and alternatives. All questions were addressed. Maximal Sterile Barrier Technique was utilized including caps, mask, sterile gowns, sterile gloves, sterile drape, hand hygiene and skin antiseptic. A timeout was performed prior to the initiation of the procedure. Both kidneys were localized ultrasound. Under ultrasound guidance, access of the collecting systems was performed with 21 gauge needles. Transitional dilator is replaced. Tracts were dilated over guidewires and 10 French nephrostomy tubes were advanced bilaterally. Both tubes were injected with contrast to confirm position and fluoroscopic images saved. The catheters were connected to gravity bags and secured at the skin with Prolene retention sutures and StatLock devices. FINDINGS: There is significant hydronephrosis on the left. After lower pole access, a  nephrostomy tube was advanced into the central collecting system and is draining well after placement. Right-sided hydronephrosis is present, to a lesser degree than on the left. Puncture of the right kidney was more difficult by ultrasound guidance, resulting in a more central puncture of the collecting system. A nephrostomy tube  was advanced into the collecting system such that it is partially formed and extends just into the proximal ureter. The tube is draining well after placement. IMPRESSION: Placement of bilateral percutaneous nephrostomy tubes. Bilateral 10 French nephrostomy tubes were placed and attached to gravity bag drainage. Electronically Signed   By: Aletta Edouard M.D.   On: 01/19/2017 15:11    ASSESSMENT & PLAN:   # 66 year old male patient with prior history of rectal cancer- with concerns for recurrence noted on the CT scan/ also bilateral hydronephrosis/acute renal failure.  # Rectal cancer- with concerns for recurrence noted on the imaging- presacral fluid collection/bilateral inguinal adenopathy/retroperitoneal adenopathy. Elevated CEA at 136. Recommend biopsy of the inguinal lymph nodes to confirm the diagnosis. Patient will also need a PET scan; which planned outpatient On 25th. We will plan biopsy after the PET scan is done.  # Acute renal failure/ bilateral ureteral obstruction secondary to the pre-sacral tumor. Awaiting percutaneous nephrostomy tubes.  # Left LE DVT on Elilquis on HOLD sec to planned [facility] placement. Patient will likely need anticoagulation indefinite/especially given diagnosis of recurrent malignancy.   # A long discussion the patient/ daughter regarding the concerning findings/likely recurrent cancer.   Thank you Dr.Gouru for the referral and allowing me to participate in the care of your pleasant patient. All questions were answered. The patient knows to call the clinic with any problems, questions or concerns. Discussed with Dr. Margaretmary Eddy.     Cammie Sickle, MD

## 2017-01-18 NOTE — Pre-Procedure Instructions (Signed)
ECG 12-lead4/26/2018 [facility] Component Name Value Ref Range  Vent Rate (bpm) 104   QRS Interval (msec) 100   QT Interval (msec) 344   QTc (msec) 452   Result Narrative  Atrial fibrillation with rapid ventricular response Incomplete right bundle branch block Nonspecific T wave abnormalities Abnormal ECG    I reviewed and concur with this report. Electronically signed EP:[id number] MD, KEN ([id number]) on 09/02/2016 5:25:32 PM  Status Results Details

## 2017-01-18 NOTE — Telephone Encounter (Signed)
New order from Dr. Lilia Argue for U/S inguinal lymph node. Order for u/s liver cnl.  rn faxed worksheet for u/s bx-inguinal lymph node to spec. Scheduling. - [place], please coordinate lymph node biopsy date and md f/u apts.  Patient has already been established by Dr. Leitha Bleak and had a procedure this morning. Planning bilateral nephrostomy tubes per urology note.  Ref pt back to Dr. Tamala Julian for port placement-order entered please arrange.

## 2017-01-18 NOTE — Progress Notes (Signed)
Patient with failed bilateral ureteral stent placement for renal failure. Plan to admit to hospitalist to monitor renal function. He will have been off Eliquis for 48 hours at 7 AM tomorrow morning. Plan for bilateral nephrostomy tube placement tomorrow. This has been discussed with Dr. Kathlene Cote from interventional radiology.  Orders have been placed for the patient to be nothing by mouth after midnight. We will hold all anticoagulants and antiplatelets at this time. PT/INR are ordered for 5 AM tomorrow.

## 2017-01-18 NOTE — Anesthesia Preprocedure Evaluation (Addendum)
Anesthesia Evaluation  Patient identified by MRN, date of birth, ID band Patient awake    Reviewed: Allergy & Precautions, NPO status , Patient's Chart, lab work & pertinent test results, reviewed documented beta blocker date and time   Airway Mallampati: III  TM Distance: >3 FB     Dental  (+) Chipped, Missing   Pulmonary shortness of breath,           Cardiovascular hypertension, Pt. on medications + Peripheral Vascular Disease and +CHF  + dysrhythmias Atrial Fibrillation      Neuro/Psych    GI/Hepatic   Endo/Other  diabetes, Type 2  Renal/GU      Musculoskeletal   Abdominal   Peds  Hematology   Anesthesia Other Findings Rectal ca. M - thurner's. Renal insuff.  Reproductive/Obstetrics                           Anesthesia Physical Anesthesia Plan  ASA: III  Anesthesia Plan: General   Post-op Pain Management:    Induction: Intravenous  PONV Risk Score and Plan:   Airway Management Planned: LMA  Additional Equipment:   Intra-op Plan:   Post-operative Plan:   Informed Consent: I have reviewed the patients History and Physical, chart, labs and discussed the procedure including the risks, benefits and alternatives for the proposed anesthesia with the patient or authorized representative who has indicated his/her understanding and acceptance.     Plan Discussed with: CRNA  Anesthesia Plan Comments:         Anesthesia Quick Evaluation

## 2017-01-18 NOTE — H&P (Signed)
[place] at [facility]   PATIENT NAME: Donald Townsend    MR#:  [id number]  DATE OF BIRTH:  09/22/1950  DATE OF ADMISSION:  01/18/2017  PRIMARY CARE PHYSICIAN: Dion Body, MD   REQUESTING/REFERRING PHYSICIAN: Pilar Jarvis  CHIEF COMPLAINT:   Hydronephrosis and acute renal insufficiency HISTORY OF PRESENT ILLNESS:  Donald Townsend  is a 66 y.o. male with a known history of Colon cancer, diabetes with this, congestive heart failure, atrial fibrillation on eliquis is admitted to urology service for bilateral hydronephrosis and bilateral ureteral stent placement in view of renal insufficiency.pt was off eliquis for 48 hrs, but unfortunately and cystoscopy revealed complete obliteration of the posterior bladder wall with what was likely colon cancer. Dr. Pilar Jarvis has requested on-call interventional radiology to place bilateral nephrostomy tubes as soon as possible and hospitalist team is called to admit the patient. Patient is scheduled to get nephrostomy tubes tomorrow. Patient denies any compressive during my examination denies any abdominal pain or hematuria  PAST MEDICAL HISTORY:   Past Medical History:  Diagnosis Date  . A-fib ([place])   . Cancer [facility])    Colon  . CHF (congestive heart failure) ([place])   . Diabetes mellitus without complication ([place])   . Dyspnea   . Elevated lipids   . Hypertension   . PVD (peripheral vascular disease) ([place])     PAST SURGICAL HISTOIRY:   Past Surgical History:  Procedure Laterality Date  . COLON RESECTION     with colostomy  . COLONOSCOPY    . IVC FILTER REMOVAL N/A 08/28/2016   Procedure: IVC Filter Removal;  Surgeon: Katha Cabal, MD;  Location: [place] [facility];  Service: Cardiovascular;  Laterality: N/A;  . LOWER EXTREMITY VENOGRAPHY Left 12/18/2016   Procedure: Lower Extremity Venography;  Surgeon: Katha Cabal, MD;  Location: [place] [facility];  Service: Cardiovascular;  Laterality: Left;  .  PERIPHERAL VASCULAR CATHETERIZATION Left 05/08/2016   Procedure: Lower Extremity Venography;  Surgeon: Katha Cabal, MD;  Location: [place] [facility];  Service: Cardiovascular;  Laterality: Left;    SOCIAL HISTORY:   Social History  Substance Use Topics  . Smoking status: Never Smoker  . Smokeless tobacco: Never Used  . Alcohol use No    FAMILY HISTORY:   Family History  Problem Relation Age of Onset  . Diabetes Mother   . Stroke Mother   . Prostate cancer Brother   . Cancer Sister        Breast cancer  . Kidney cancer Neg Hx   . Bladder Cancer Neg Hx     DRUG ALLERGIES:  No Known Allergies  REVIEW OF SYSTEMS:  CONSTITUTIONAL: No fever, fatigue or weakness.  EYES: No blurred or double vision.  EARS, NOSE, AND THROAT: No tinnitus or ear pain.  RESPIRATORY: No cough, shortness of breath, wheezing or hemoptysis.  CARDIOVASCULAR: No chest pain, orthopnea, edema.  GASTROINTESTINAL: No nausea, vomiting, diarrhea or abdominal pain. Had colostomy GENITOURINARY: No dysuria, hematuria.  ENDOCRINE: No polyuria, nocturia,  HEMATOLOGY: No anemia, easy bruising or bleeding SKIN: No rash or lesion. MUSCULOSKELETAL: No joint pain or arthritis.   NEUROLOGIC: No tingling, numbness, weakness.  PSYCHIATRY: No anxiety or depression.   MEDICATIONS AT HOME:   Prior to Admission medications   Medication Sig Start Date End Date Taking? Authorizing Provider  aspirin EC 81 MG tablet Take 81 mg by mouth daily.  01/12/16  Yes [provider]  ELIQUIS 5 MG TABS tablet  Take 5 mg by mouth 2 (two) times daily.  05/02/16  Yes [provider]  furosemide (LASIX) 40 MG tablet Take 40 mg by mouth daily.  04/25/16  Yes [provider]  glimepiride (AMARYL) 4 MG tablet Take 4 mg by mouth 2 (two) times daily.  04/21/16  Yes [provider]  insulin aspart (NOVOLOG) 100 UNIT/ML FlexPen Inject 3 Units into the skin.  12/13/16 12/13/17 Yes [provider]   LANTUS SOLOSTAR 100 UNIT/ML Solostar Pen Inject 10 Units into the skin at bedtime. 12/11/16  Yes [provider]  losartan (COZAAR) 100 MG tablet take 100mg  by mouth once daily 01/13/16  Yes [provider]  metFORMIN (GLUCOPHAGE) 1000 MG tablet take 1000mg  by mouth twice daily with food 01/13/16  Yes [provider]  metoprolol tartrate (LOPRESSOR) 25 MG tablet Take 25 mg by mouth 2 (two) times daily.  04/25/16  Yes [provider]  pravastatin (PRAVACHOL) 20 MG tablet take 20mg  by mouth daily at bedtime 02/27/16  Yes [provider]  tamsulosin (FLOMAX) 0.4 MG CAPS capsule Take 0.4 mg by mouth daily. 12/07/16  Yes [provider]  digoxin (LANOXIN) 0.25 MG tablet Take 0.25 mg by mouth daily.  01/06/16 01/16/17  [provider]  Lancets 30G MISC Use 1 Units as directed. Check CBG's up to twice daily. Dx: E11.9 04/15/15   [provider]      VITAL SIGNS:  Blood pressure (!) 148/87, pulse 66, temperature 98.1 F (36.7 C), temperature source Oral, resp. rate 20, height 5' 10.5" (1.791 m), weight 117 kg (257 lb 14.4 oz), SpO2 100 %.  PHYSICAL EXAMINATION:  GENERAL:  66 y.o.-year-old patient lying in the bed with no acute distress.  EYES: Pupils equal, round, reactive to light and accommodation. No scleral icterus. Extraocular muscles intact.  HEENT: Head atraumatic, normocephalic. Oropharynx and nasopharynx clear.  NECK:  Supple, no jugular venous distention. No thyroid enlargement, no tenderness.  LUNGS: Normal breath sounds bilaterally, no wheezing, rales,rhonchi or crepitation. No use of accessory muscles of respiration.  CARDIOVASCULAR: S1, S2 normal. No murmurs, rubs, or gallops.  ABDOMEN: Soft, nontender, nondistended. Bowel sounds present. Colostomy site is intact EXTREMITIES: No pedal edema, cyanosis, or clubbing.  NEUROLOGIC: Cranial nerves II through XII are intact. Muscle strength 5/5 in all extremities. Sensation  intact. Gait not checked.  PSYCHIATRIC: The patient is alert and oriented x 3.  SKIN: No obvious rash, lesion, or ulcer.   LABORATORY PANEL:   CBC  Recent Labs Lab 01/16/17 1615 01/18/17 1542  WBC 13.9*  --   HGB 13.2 12.3*  HCT 37.9* 35.5*  PLT 280  --    ------------------------------------------------------------------------------------------------------------------  Chemistries   Recent Labs Lab 01/16/17 1615  NA 138  K 3.9  CL 104  CO2 22  GLUCOSE 152*  BUN 37*  CREATININE 2.46*  CALCIUM 9.3  AST 28  ALT 26  ALKPHOS 68  BILITOT 0.4   ------------------------------------------------------------------------------------------------------------------  Cardiac Enzymes No results for input(s): TROPONINI in the last 168 hours. ------------------------------------------------------------------------------------------------------------------  RADIOLOGY:  No results found.  EKG:   Orders placed or performed in visit on 12/16/12  . EKG 12-Lead    IMPRESSION AND PLAN:   Fabrizio Filip  is a 66 y.o. male with a known history of Colon cancer, diabetes with this, congestive heart failure, atrial fibrillation on eliquis is admitted to urology service for bilateral hydronephrosis and bilateral ureteral stent placement in view of renal insufficiency.pt was off eliquis for 48 hrs,  but unfortunately and cystoscopy revealed complete obliteration of the posterior bladder wall with what was likely colon cancer. Dr. Pilar Jarvis has requested on-call interventional radiology to place bilateral nephrostomy tubes as soon as possible and hospitalist team is called to admit the patient  #Acute kidney injury from bilateral hydronephrosis Admit to MedSurg unit Patient is going to get bilateral nephrostomy tubes placement by interventional radiology tomorrow On Ancef, IV fluids Nothing by mouth after midnight Urology is actively managing Monitor hemoglobin and hematocrit closely as patient  might develop hematuria as he had cystoscopy today Nephrology and urology consult placed  #Chronic atrial fibrillation Rate controlled continue Toprol Holding eliquis  # hypertension Continue home medication Toprol and hold Cozaar in view of acute renal insufficiency  # colon Cancer Status post colectomy Oncology consult is placed and discussed with Dr. Yevette Edwards  #CHF not fluid overloaded holding Lasix #Diabetes mellitus sliding scale insulin holding by mouth medications   All the records are reviewed and case discussed with ED provider. Management plans discussed with the patient, family and they are in agreement.  CODE STATUS: Full code  TOTAL TIME TAKING CARE OF THIS PATIENT: 45  minutes.   Note: This dictation was prepared with Dragon dictation along with smaller phrase technology. Any transcriptional errors that result from this process are unintentional.  Nicholes Mango M.D on 01/18/2017 at 6:41 PM  Between 7am to 6pm - Pager - [phone]  After 6pm go to [email or web address] - password [email or web address] [place] [facility]  Office  [phone]  CC: Primary care physician; Dion Body, MD

## 2017-01-18 NOTE — Consult Note (Signed)
Chief Complaint: Patient was seen in consultation today for bilateral percutaneous nephrostomies   Referring Physician(s): Budzyn,B  Supervising Physician: Aletta Edouard  Patient Status: [place] - In-pt  History of Present Illness: Donald Townsend is a 66 y.o. male with history of colon cancer 2012, status post resection with colostomy formation, atrial fibrillation, CHF, diabetes, hypertension, peripheral vascular disease, left lower extremity DVT (on eliquis) with prior stenting, May Thurner syndrome. He presents now with increasing creatinine and bilateral hydronephrosis secondary to obstruction likely from recurrent colon cancer. He underwent cystoscopy with attempted ureteral stent placement today by urology, however they were unsuccessful secondary to complete obliteration of the trigone and posterior bladder wall most likely by recurrent colon cancer. Attempts were made to find ureteral orifices but unsuccessful due to patient's anatomy. Request now received for bilateral percutaneous nephrostomy tube placements. Current laboratory values include WBC 13.9, hemoglobin 13.2, platelets 280k, creatinine 2.46, CEA 132.4.  Past Medical History:  Diagnosis Date  . A-fib ([place])   . Cancer [facility])    Colon  . CHF (congestive heart failure) ([place])   . Diabetes mellitus without complication ([place])   . Dyspnea   . Elevated lipids   . Hypertension   . PVD (peripheral vascular disease) ([place])     Past Surgical History:  Procedure Laterality Date  . COLON RESECTION     with colostomy  . COLONOSCOPY    . IVC FILTER REMOVAL N/A 08/28/2016   Procedure: IVC Filter Removal;  Surgeon: Katha Cabal, MD;  Location: [place] [facility];  Service: Cardiovascular;  Laterality: N/A;  . LOWER EXTREMITY VENOGRAPHY Left 12/18/2016   Procedure: Lower Extremity Venography;  Surgeon: Katha Cabal, MD;  Location: [place] [facility];  Service: Cardiovascular;  Laterality: Left;  . PERIPHERAL  VASCULAR CATHETERIZATION Left 05/08/2016   Procedure: Lower Extremity Venography;  Surgeon: Katha Cabal, MD;  Location: [place] [facility];  Service: Cardiovascular;  Laterality: Left;    Allergies: Patient has no known allergies.  Medications: Prior to Admission medications   Medication Sig Start Date End Date Taking? Authorizing Provider  aspirin EC 81 MG tablet Take 81 mg by mouth daily.  01/12/16  Yes [provider]  ELIQUIS 5 MG TABS tablet Take 5 mg by mouth 2 (two) times daily.  05/02/16  Yes [provider]  furosemide (LASIX) 40 MG tablet Take 40 mg by mouth daily.  04/25/16  Yes [provider]  glimepiride (AMARYL) 4 MG tablet Take 4 mg by mouth 2 (two) times daily.  04/21/16  Yes [provider]  insulin aspart (NOVOLOG) 100 UNIT/ML FlexPen Inject 3 Units into the skin.  12/13/16 12/13/17 Yes [provider]  LANTUS SOLOSTAR 100 UNIT/ML Solostar Pen Inject 10 Units into the skin at bedtime. 12/11/16  Yes [provider]  losartan (COZAAR) 100 MG tablet take 100mg  by mouth once daily 01/13/16  Yes [provider]  metFORMIN (GLUCOPHAGE) 1000 MG tablet take 1000mg  by mouth twice daily with food 01/13/16  Yes [provider]  metoprolol tartrate (LOPRESSOR) 25 MG tablet Take 25 mg by mouth 2 (two) times daily.  04/25/16  Yes [provider]  pravastatin (PRAVACHOL) 20 MG tablet take 20mg  by mouth daily at bedtime 02/27/16  Yes [provider]  tamsulosin (FLOMAX) 0.4 MG CAPS capsule Take 0.4 mg by mouth daily. 12/07/16  Yes [provider]  digoxin (LANOXIN) 0.25 MG tablet Take 0.25 mg by mouth daily.  01/06/16 01/16/17  [provider]  Lancets 30G MISC Use 1 Units as directed. Check CBG's up to twice daily. Dx: E11.9 04/15/15   [provider]     Family History  Problem Relation Age of Onset  . Diabetes Mother   . Stroke Mother   . Prostate cancer Brother   .  Cancer Sister        Breast cancer  . Kidney cancer Neg Hx   . Bladder Cancer Neg Hx     Social History   Social History  . Marital status: Divorced    Spouse name: N/A  . Number of children: N/A  . Years of education: N/A   Social History Main Topics  . Smoking status: Never Smoker  . Smokeless tobacco: Never Used  . Alcohol use No  . Drug use: No  . Sexual activity: Not Asked   Other Topics Concern  . None   Social History Narrative  . None      Review of Systems currently denies fever, headache, chest pain, worsening dyspnea, cough, nausea, vomiting or bleeding. He has had some mild pelvic and intermittent back discomfort.  Vital Signs: BP (!) 152/84   Pulse 71   Temp (!) 97.5 F (36.4 C)   Resp (!) 7   Ht 5\' 11"  (1.803 m)   Wt 261 lb (118.4 kg)   SpO2 100%   BMI 36.40 kg/m   Physical Exam awake, answers questions appropriately; daughter at bedside; chest clear to auscultation bilaterally. Heart with regular rate and rhythm. Abdomen soft, positive bowel sounds, intact left lower quadrant colostomy; not significantly tender. 1-2+ left lower extremity edema , no RLE edema..  Imaging: Ct Hematuria Workup  Result Date: 01/11/2017 CLINICAL DATA:  Followup colon cancer. EXAM: CT ABDOMEN AND PELVIS WITHOUT AND WITH CONTRAST TECHNIQUE: Multidetector CT imaging of the abdomen and pelvis was performed following the standard protocol before and following the bolus administration of intravenous contrast. CONTRAST:  160mL ISOVUE-300 IOPAMIDOL (ISOVUE-300) INJECTION 61% COMPARISON:  CT scan 04/27/2016 and 08/24/2014 FINDINGS: Lower chest: The lung bases are clear of acute process. No pleural effusion or pulmonary lesions. The heart is normal in size. No pericardial effusion. Stable coronary artery calcifications. The distal esophagus and aorta are unremarkable. Hepatobiliary: No focal hepatic lesions or intrahepatic biliary dilatation the gallbladder appears normal. No common  bile duct dilatation. Pancreas: No mass, inflammation or ductal dilatation. Spleen: Normal size.  No focal lesions. Adrenals/Urinary Tract: The adrenal glands are normal in stable. Bilateral hydroureteronephrosis down to enlarging pelvic mass. The right-sided hydroureteronephrosis is new and the left side is progressive. The bladder contains a Foley catheter. There is diffuse asymmetric bladder wall thickening which appears progressive. The delayed images demonstrate compression but not complete obstruction of the right ureter. Stomach/Bowel: The stomach, duodenum, small bowel and colon are grossly normal without oral contrast. No acute inflammatory changes or obstructive findings. Stable left lower quadrant colostomy. Vascular/Lymphatic: Stable atherosclerotic calcifications involving the aorta and iliac arteries. No aneurysm. New retroperitoneal lymphadenopathy. 15 mm periaortic lymph node on image number 38. 14 mm right-sided retroperitoneal lymph node posterior to the IVC on image number 41. Left para-aortic lymph node on image number 50 measures 13.5 mm. A left common iliac vein stent is noted. Reproductive: The prostate gland is grossly normal. The seminal vesicles are difficult to identified. Other: Enlarging presacral soft tissue mass which appears confluent with the base of the bladder. Central fluid collection appears stable. The presacral tumor is extending higher up into the pelvis  where it is likely obstructing both ureters. Bilateral inguinal lymphadenopathy is new. Index node on the right side on image number 94 measures 14.5 mm an index node on the left side on image number 102 measures 17 mm. Musculoskeletal: No definite metastatic osseous disease. IMPRESSION: 1. 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 New right-sided hydroureteronephrosis with compression of the distal ureter but no complete obstruction. The left  ureter appears completely obstructed. 2. New retroperitoneal lymphadenopathy. 3. No findings for bowel obstruction. Left lower quadrant colostomy is stable. 4. No definite CT findings for hepatic metastatic disease. 5. New bilateral inguinal lymphadenopathy. Electronically Signed   By: Marijo Sanes M.D.   On: 01/11/2017 13:18    Labs:  CBC:  Recent Labs  04/19/16 1407 05/08/16 1854 05/09/16 0136 01/16/17 1615  WBC 13.3* 10.4 8.4 13.9*  HGB 14.5 13.0 12.5* 13.2  HCT 42.3 38.1* 36.3* 37.9*  PLT 254 198 181 280    COAGS:  Recent Labs  05/08/16 1854  INR 1.11  APTT 37*    BMP:  Recent Labs  04/19/16 1407 05/07/16 1049 12/17/16 0859 01/01/17 1621 01/16/17 1615  NA 138  --  140  --  138  K 3.9  --  4.4  --  3.9  CL 103  --  102  --  104  CO2 25  --  28  --  22  GLUCOSE 105*  --  172*  --  152*  BUN 24* 21* 22* 21 37*  CALCIUM 9.5  --  9.8  --  9.3  CREATININE 1.07 1.01 1.39* 1.82* 2.46*  GFRNONAA >60 >60 52* 38* 26*  GFRAA >60 >60 60* 44* 30*    LIVER FUNCTION TESTS:  Recent Labs  04/19/16 1407 01/16/17 1615  BILITOT 0.8 0.4  AST 32 28  ALT 24 26  ALKPHOS 46 68  PROT 7.3 7.4  ALBUMIN 3.7 3.5    TUMOR MARKERS: No results for input(s): AFPTM, CEA, CA199, CHROMGRNA in the last 8760 hours.  Assessment and Plan: 66 y.o. male with history of colon cancer 2012, status post resection with colostomy formation, atrial fibrillation, CHF, diabetes, hypertension, peripheral vascular disease, left lower extremity DVT (on eliquis) with prior stenting, May Thurner syndrome. He presents now with increasing creatinine and bilateral hydronephrosis secondary to obstruction likely from recurrent colon cancer. He underwent cystoscopy with attempted ureteral stent placement today by urology, however they were unsuccessful secondary to complete obliteration of the trigone and posterior bladder wall most likely by recurrent colon cancer. Attempts were made to find ureteral  orifices but unsuccessful due to patient's anatomy. Request now received for bilateral percutaneous nephrostomy tube placements. Current laboratory values include WBC 13.9, hemoglobin 13.2, platelets 280k, creatinine 2.46, CEA 132.4. Latest imaging studies and case have been reviewed by Dr. Kathlene Cote and discussed with Dr. Pilar Jarvis.Risks and benefits of bilateral nephrostomies were discussed with the patient/daughter including, but not limited to, infection, bleeding, significant bleeding causing loss or decrease in renal function or damage to adjacent structures, need for prolonged drainage.All of the patient's questions were answered, patient is agreeable to proceed.Consent signed and in chart. Procedure tent planned for 9/22. Hold anticoagulants until after case.      Thank you for this interesting consult.  I greatly enjoyed meeting Donald Townsend and look forward to participating in their care.  A copy of this report was sent to the requesting provider on this date.  Electronically Signed: D. Rowe Robert, PA-C 01/18/2017, 3:05 PM   I spent a total of  40 minutes   in face to face in clinical consultation, greater than 50% of which was counseling/coordinating care for bilateral percutaneous nephrostomy tube placements

## 2017-01-18 NOTE — Transfer of Care (Signed)
Immediate Anesthesia Transfer of Care Note  Patient: Donald Townsend  Procedure(s) Performed: Procedure(s): CYSTOSCOPY WITH STENT PLACEMENT (Bilateral)  Patient Location: PACU  Anesthesia Type:General  Level of Consciousness: awake  Airway & Oxygen Therapy: Patient Spontanous Breathing  Post-op Assessment: Report given to RN  Post vital signs: stable  Last Vitals:  Vitals:   01/18/17 1100 01/18/17 1214  BP: (!) 142/80 132/70  Pulse: 75 78  Resp: 17 14  Temp: 36.5 C (!) 36.4 C  SpO2: 100% 99%    Last Pain:  Vitals:   01/18/17 1100  TempSrc: Oral         Complications: No apparent anesthesia complications

## 2017-01-18 NOTE — Anesthesia Postprocedure Evaluation (Signed)
Anesthesia Post Note  Patient: Donald Townsend  Procedure(s) Performed: Procedure(s) (LRB): CYSTOSCOPY WITH STENT PLACEMENT (Bilateral)  Patient location during evaluation: PACU Anesthesia Type: General Level of consciousness: awake and alert Pain management: pain level controlled Vital Signs Assessment: post-procedure vital signs reviewed and stable Respiratory status: spontaneous breathing, nonlabored ventilation, respiratory function stable and patient connected to nasal cannula oxygen Cardiovascular status: blood pressure returned to baseline and stable Postop Assessment: no apparent nausea or vomiting Anesthetic complications: no     Last Vitals:  Vitals:   01/18/17 1229 01/18/17 1244  BP: 124/71 (!) 141/93  Pulse: 70 69  Resp: 15 19  Temp:    SpO2: 99% 100%    Last Pain:  Vitals:   01/18/17 1244  TempSrc:   PainSc: 0-No pain                 Mychaela Lennartz S

## 2017-01-18 NOTE — Op Note (Signed)
Date of procedure: 01/18/17  Preoperative diagnosis:  1. Bilateral hydroureteronephrosis 2. Colon cancer  3. Urinary retention  Postoperative diagnosis:  1. Same   Procedure: 1. Cystoscopy 2. Attempted bilateral ureteral stent placement 3. Foley catheter exchange  Surgeon: Baruch Gouty, MD  Anesthesia: General  Complications: None  Intraoperative findings: The floor of the patient's bladder was completely overtaken by likely colon cancer versus inflammation. The anatomy was ill-defined. I was unable to find either ureteral orifices . Foley catheter was exchanged and the procedure.  EBL: None  Specimens: None  Drains: 16 French coud catheter  Disposition: Stable to the postanesthesia care unit  Indication for procedure: The patient is a 66 y.o. male with history of colon cancer he subsequently developed bilateral hydroureteronephrosis with likely invasion of the bladder is now experiencing renal failure presents today for urgent bilateral ureteral stent placement.  After reviewing the management options for treatment, the patient elected to proceed with the above surgical procedure(s). We have discussed the potential benefits and risks of the procedure, side effects of the proposed treatment, the likelihood of the patient achieving the goals of the procedure, and any potential problems that might occur during the procedure or recuperation. Informed consent has been obtained.  Description of procedure: The patient was met in the preoperative area. All risks, benefits, and indications of the procedure were described in great detail. The patient consented to the procedure. Preoperative antibiotics were given. The patient was taken to the operative theater. General anesthesia was induced per the anesthesia service. The patient's catheter was removed. The patient was then placed in the dorsal lithotomy position and prepped and draped in the usual sterile fashion. A preoperative timeout  was called.   A 21 French 30 cystoscope was inserted into the patient's bladder per urethra atraumatically. Pan cystoscopy revealed complete obliteration of the trigone and posterior bladder wall with what was likely colon cancer. Attempts were made to find the ureteral orifices but this was not successful due to the patient's anatomy. Due to the obliteration, further attempts were aborted. The cystoscope was withdrawn. The patient's 21 French Foley catheter was replaced. He was then woken and transferred in stable condition to postanesthesia care unit.  Plan: The patient will be admitted to the hospitalists to monitor his renal function. The plan will be for him to undergo bilateral nephrostomy tube placement urgently tomorrow once he has been off his Eliquis for 48 hours.  Baruch Gouty, M.D.

## 2017-01-18 NOTE — OR Nursing (Signed)
Patient has colostomy bag in place. Also has foley catheter with leg bag. Dr Pilar Jarvis made aware that patients last dose of eliquis was 01/17/17 in the AM.

## 2017-01-18 NOTE — H&P (Signed)
01/18/2017 11:07 AM   Donald Townsend [birth year] [id number]  CC: Bilateral hydronephrosis  HPI: The patient is a 66 year old gentleman with a past medical history of colon cancer causing worsening bilateral hydronephrosis and possible invasion of the bladder presents today for urgent bilateral ureteral stent placementdue to severe worsening of his creatinine over the last 2 days.  His creatinine 2 days ago was 2.46. 2 ACS 1.82. One month ago was 1.39. His baseline prior to that was 1.   PMH: Past Medical History:  Diagnosis Date  . A-fib ([place])   . Cancer [facility])    Colon  . CHF (congestive heart failure) ([place])   . Diabetes mellitus without complication ([place])   . Dyspnea   . Elevated lipids   . Hypertension   . PVD (peripheral vascular disease) [facility])     Surgical History: Past Surgical History:  Procedure Laterality Date  . COLON RESECTION     with colostomy  . COLONOSCOPY    . IVC FILTER REMOVAL N/A 08/28/2016   Procedure: IVC Filter Removal;  Surgeon: Katha Cabal, MD;  Location: [place] CV LAB;  Service: Cardiovascular;  Laterality: N/A;  . LOWER EXTREMITY VENOGRAPHY Left 12/18/2016   Procedure: Lower Extremity Venography;  Surgeon: Katha Cabal, MD;  Location: [place] CV LAB;  Service: Cardiovascular;  Laterality: Left;  . PERIPHERAL VASCULAR CATHETERIZATION Left 05/08/2016   Procedure: Lower Extremity Venography;  Surgeon: Katha Cabal, MD;  Location: [place] CV LAB;  Service: Cardiovascular;  Laterality: Left;    Allergies: No Known Allergies  Family History: Family History  Problem Relation Age of Onset  . Diabetes Mother   . Stroke Mother   . Prostate cancer Brother   . Cancer Sister        Breast cancer  . Kidney cancer Neg Hx   . Bladder Cancer Neg Hx     Social History:  reports that he has never smoked. He has never used smokeless tobacco. He reports that he does not drink alcohol or use drugs.  ROS: 12 point ROS  otherwise negative  Physical Exam: BP (!) 142/80   Pulse 75   Temp 97.7 F (36.5 C) (Oral)   Resp 17   Ht 5\' 11"  (1.803 m)   Wt 261 lb (118.4 kg)   SpO2 100%   BMI 36.40 kg/m   Constitutional:  Alert and oriented, No acute distress. HEENT: [place] AT, moist mucus membranes.  Trachea midline, no masses. Cardiovascular: No clubbing, cyanosis, or edema. RRR. Respiratory: Normal respiratory effort, no increased work of breathing. Lungs clear. GI: Abdomen is soft, nontender, nondistended, no abdominal masses GU: No CVA tenderness.  Skin: No rashes, bruises or suspicious lesions. Lymph: No cervical or inguinal adenopathy. Neurologic: Grossly intact, no focal deficits, moving all 4 extremities. Psychiatric: Normal mood and affect.  Laboratory Data: Lab Results  Component Value Date   WBC 13.9 (H) 01/16/2017   HGB 13.2 01/16/2017   HCT 37.9 (L) 01/16/2017   MCV 85.3 01/16/2017   PLT 280 01/16/2017    Lab Results  Component Value Date   CREATININE 2.46 (H) 01/16/2017    No results found for: PSA  No results found for: TESTOSTERONE  No results found for: HGBA1C  Urinalysis    Component Value Date/Time   COLORURINE Straw 12/16/2012 1419   APPEARANCEUR Clear 01/01/2017 1640   LABSPEC 1.026 12/16/2012 1419   PHURINE 5.0 12/16/2012 1419   GLUCOSEU Negative 01/01/2017 1640   GLUCOSEU >=500 12/16/2012  1419   HGBUR Negative 12/16/2012 1419   BILIRUBINUR Negative 01/01/2017 1640   BILIRUBINUR Negative 12/16/2012 1419   KETONESUR Negative 12/16/2012 1419   PROTEINUR Negative 01/01/2017 1640   PROTEINUR Negative 12/16/2012 1419   NITRITE Negative 01/01/2017 1640   NITRITE Negative 12/16/2012 1419   LEUKOCYTESUR Negative 01/01/2017 1640   LEUKOCYTESUR Negative 12/16/2012 1419    Pertinent Imaging: CT reviewed  Assessment & Plan:    1. Bilateral hydronephrosis 2. Colon cancer I discussed with the patient the goal today is to place bilateral ureteral stents. We did  discuss that this may not be possible due to his disease. If that is the case, he would need placement of nephrostomy tubes or interventional radiology in the near future. We discussed the risks, benefits, and indications of this procedure. All questions were answered. The patient has elected to proceed.   [name], MD  [facility] [address], [place], [place] [address] [phone]

## 2017-01-18 NOTE — Anesthesia Post-op Follow-up Note (Signed)
Anesthesia QCDR form completed.        

## 2017-01-19 ENCOUNTER — Encounter: Payer: Self-pay | Admitting: Interventional Radiology

## 2017-01-19 ENCOUNTER — Encounter
Admission: RE | Disposition: A | Discharge status: Home / Self Care | Payer: Self-pay | Source: Ambulatory Visit | Attending: Internal Medicine

## 2017-01-19 DIAGNOSIS — Z8042 Family history of malignant neoplasm of prostate: Secondary | ICD-10-CM

## 2017-01-19 DIAGNOSIS — I509 Heart failure, unspecified: Secondary | ICD-10-CM

## 2017-01-19 DIAGNOSIS — R970 Elevated carcinoembryonic antigen [CEA]: Secondary | ICD-10-CM

## 2017-01-19 DIAGNOSIS — I739 Peripheral vascular disease, unspecified: Secondary | ICD-10-CM

## 2017-01-19 DIAGNOSIS — R0602 Shortness of breath: Secondary | ICD-10-CM

## 2017-01-19 DIAGNOSIS — I10 Essential (primary) hypertension: Secondary | ICD-10-CM

## 2017-01-19 DIAGNOSIS — G629 Polyneuropathy, unspecified: Secondary | ICD-10-CM

## 2017-01-19 DIAGNOSIS — E785 Hyperlipidemia, unspecified: Secondary | ICD-10-CM

## 2017-01-19 DIAGNOSIS — R599 Enlarged lymph nodes, unspecified: Secondary | ICD-10-CM

## 2017-01-19 DIAGNOSIS — N179 Acute kidney failure, unspecified: Principal | ICD-10-CM

## 2017-01-19 DIAGNOSIS — N1330 Unspecified hydronephrosis: Secondary | ICD-10-CM

## 2017-01-19 DIAGNOSIS — I4891 Unspecified atrial fibrillation: Secondary | ICD-10-CM

## 2017-01-19 DIAGNOSIS — Z79899 Other long term (current) drug therapy: Secondary | ICD-10-CM

## 2017-01-19 DIAGNOSIS — Z85048 Personal history of other malignant neoplasm of rectum, rectosigmoid junction, and anus: Secondary | ICD-10-CM

## 2017-01-19 DIAGNOSIS — I82402 Acute embolism and thrombosis of unspecified deep veins of left lower extremity: Secondary | ICD-10-CM

## 2017-01-19 DIAGNOSIS — E119 Type 2 diabetes mellitus without complications: Secondary | ICD-10-CM

## 2017-01-19 DIAGNOSIS — Z794 Long term (current) use of insulin: Secondary | ICD-10-CM

## 2017-01-19 HISTORY — PX: IR NEPHROSTOMY PLACEMENT LEFT: IMG6063

## 2017-01-19 HISTORY — PX: IR NEPHROSTOMY PLACEMENT RIGHT: IMG6064

## 2017-01-19 LAB — CBC WITH DIFFERENTIAL/PLATELET
BASOS ABS: 0.1 10*3/uL (ref 0–0.1)
Basophils Relative: 1 %
Eosinophils Absolute: 1.2 10*3/uL — ABNORMAL HIGH (ref 0–0.7)
Eosinophils Relative: 13 %
HEMATOCRIT: 32.6 % — AB (ref 40.0–52.0)
Hemoglobin: 11.5 g/dL — ABNORMAL LOW (ref 13.0–18.0)
LYMPHS PCT: 13 %
Lymphs Abs: 1.2 10*3/uL (ref 1.0–3.6)
MCH: 30 pg (ref 26.0–34.0)
MCHC: 35.4 g/dL (ref 32.0–36.0)
MCV: 84.9 fL (ref 80.0–100.0)
Monocytes Absolute: 0.8 10*3/uL (ref 0.2–1.0)
Monocytes Relative: 8 %
NEUTROS ABS: 6.1 10*3/uL (ref 1.4–6.5)
Neutrophils Relative %: 65 %
Platelets: 213 10*3/uL (ref 150–440)
RBC: 3.83 MIL/uL — AB (ref 4.40–5.90)
RDW: 13.1 % (ref 11.5–14.5)
WBC: 9.3 10*3/uL (ref 3.8–10.6)

## 2017-01-19 LAB — PROTIME-INR
INR: 1.19
Prothrombin Time: 15 seconds (ref 11.4–15.2)

## 2017-01-19 LAB — GLUCOSE, CAPILLARY
GLUCOSE-CAPILLARY: 120 mg/dL — AB (ref 65–99)
GLUCOSE-CAPILLARY: 113 mg/dL — AB (ref 65–99)
GLUCOSE-CAPILLARY: 131 mg/dL — AB (ref 65–99)
Glucose-Capillary: 124 mg/dL — ABNORMAL HIGH (ref 65–99)

## 2017-01-19 LAB — COMPREHENSIVE METABOLIC PANEL
ALT: 15 U/L — ABNORMAL LOW (ref 17–63)
ANION GAP: 7 (ref 5–15)
AST: 20 U/L (ref 15–41)
Albumin: 2.8 g/dL — ABNORMAL LOW (ref 3.5–5.0)
Alkaline Phosphatase: 49 U/L (ref 38–126)
BUN: 30 mg/dL — ABNORMAL HIGH (ref 6–20)
CALCIUM: 8.2 mg/dL — AB (ref 8.9–10.3)
CHLORIDE: 113 mmol/L — AB (ref 101–111)
CO2: 23 mmol/L (ref 22–32)
CREATININE: 2.35 mg/dL — AB (ref 0.61–1.24)
GFR, EST AFRICAN AMERICAN: 32 mL/min — AB (ref >60)
GFR, EST NON AFRICAN AMERICAN: 27 mL/min — AB (ref >60)
Glucose, Bld: 115 mg/dL — ABNORMAL HIGH (ref 65–99)
Potassium: 4.1 mmol/L (ref 3.5–5.1)
SODIUM: 143 mmol/L (ref 135–145)
Total Bilirubin: 0.6 mg/dL (ref 0.3–1.2)
Total Protein: 6 g/dL — ABNORMAL LOW (ref 6.5–8.1)

## 2017-01-19 LAB — HEMOGLOBIN A1C
Hgb A1c MFr Bld: 7.5 % — ABNORMAL HIGH (ref 4.8–5.6)
Mean Plasma Glucose: 168.55 mg/dL

## 2017-01-19 SURGERY — RENAL ANGIOGRAPHY
Anesthesia: Moderate Sedation | Laterality: Left

## 2017-01-19 SURGERY — Surgical Case
Anesthesia: *Unknown

## 2017-01-19 MED ORDER — IOPAMIDOL (ISOVUE-300) INJECTION 61%: 30.0000 mL | Freq: Once | INTRAVENOUS | Status: DC | PRN

## 2017-01-19 MED ORDER — FENTANYL CITRATE (PF) 100 MCG/2ML IJ SOLN
INTRAMUSCULAR | Status: AC
  Filled 2017-01-19: qty 2

## 2017-01-19 MED ORDER — HYDROMORPHONE HCL 1 MG/ML IJ SOLN
1.0000 mg | Freq: Once | INTRAMUSCULAR | Status: AC
  Administered 2017-01-19: 1 mg via INTRAVENOUS

## 2017-01-19 MED ORDER — CEFAZOLIN SODIUM-DEXTROSE 2-4 GM/100ML-% IV SOLN
INTRAVENOUS | Status: AC
  Filled 2017-01-19: qty 100

## 2017-01-19 MED ORDER — HYDROMORPHONE HCL 1 MG/ML IJ SOLN
INTRAMUSCULAR | Status: AC
  Administered 2017-01-19: 1 mg via INTRAVENOUS
  Filled 2017-01-19: qty 1

## 2017-01-19 MED ORDER — SODIUM CHLORIDE 0.9% FLUSH
5.0000 mL | Freq: Three times a day (TID) | INTRAVENOUS | Status: DC
  Administered 2017-01-19 – 2017-01-21 (×6): 5 mL via INTRAVENOUS

## 2017-01-19 MED ORDER — LIDOCAINE HCL (PF) 1 % IJ SOLN
INTRAMUSCULAR | Status: AC
  Filled 2017-01-19: qty 30

## 2017-01-19 MED ORDER — MIDAZOLAM HCL 5 MG/5ML IJ SOLN
INTRAMUSCULAR | Status: AC | PRN
  Administered 2017-01-19 (×3): 1 mg via INTRAVENOUS

## 2017-01-19 MED ORDER — MIDAZOLAM HCL 5 MG/5ML IJ SOLN
INTRAMUSCULAR | Status: AC
  Filled 2017-01-19: qty 5

## 2017-01-19 MED ORDER — FENTANYL CITRATE (PF) 100 MCG/2ML IJ SOLN
INTRAMUSCULAR | Status: AC | PRN
  Administered 2017-01-19 (×2): 25 ug via INTRAVENOUS
  Administered 2017-01-19: 50 ug via INTRAVENOUS

## 2017-01-19 NOTE — Consult Note (Signed)
[facility] CONSULT NOTE    Date: 01/19/2017                  Patient Name:  Donald Townsend  MRN: [id number]  DOB: 1951/02/13  Age / Sex: 66 y.o., male         PCP: Dion Body, MD                 Service Requesting Consult: Hospitalist                 Reason for Consult: Acute renal failure            History of Present Illness: Patient is a 66 y.o. male with a PMHx of Atrial fibrillation, colon cancer, congestive heart failure, diabetes mellitus type 2 without complication, hyperlipidemia, hypertension, peripheral vascular disease, who was admitted to [facility] on 01/18/2017 for evaluation of bilateral hydronephrosis.  Patient had CT scan of the abdomen and pelvis on 01/11/2017. 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 He subsequent he saw urology. Bilateral ureteral stents were attempted but unsuccessful. He was subsequently admitted for consideration of bilateral nephrostomy placement. This should be occurring today. He does have some hematuria and now post procedure.   Medications: Outpatient medications: Prescriptions Prior to Admission  Medication Sig Dispense Refill Last Dose  . aspirin EC 81 MG tablet Take 81 mg by mouth daily.    01/17/2017 at Unknown time  . ELIQUIS 5 MG TABS tablet Take 5 mg by mouth 2 (two) times daily.    01/17/2017 at Unknown time  . furosemide (LASIX) 40 MG tablet Take 40 mg by mouth daily.    01/17/2017 at Unknown time  . glimepiride (AMARYL) 4 MG tablet Take 4 mg by mouth 2 (two) times daily.   0 01/17/2017 at Unknown time  . insulin aspart (NOVOLOG) 100 UNIT/ML FlexPen Inject 3 Units into the skin.    01/18/2017 at Unknown time  . LANTUS SOLOSTAR 100 UNIT/ML Solostar Pen Inject 10 Units into the skin at bedtime.  0 01/17/2017 at Unknown time  . losartan (COZAAR) 100 MG tablet take 100mg  by mouth once daily   01/18/2017 at Unknown time  . metFORMIN (GLUCOPHAGE) 1000 MG tablet take  1000mg  by mouth twice daily with food   01/17/2017 at Unknown time  . metoprolol tartrate (LOPRESSOR) 25 MG tablet Take 25 mg by mouth 2 (two) times daily.    01/18/2017 at Unknown time  . pravastatin (PRAVACHOL) 20 MG tablet take 20mg  by mouth daily at bedtime   01/17/2017 at Unknown time  . tamsulosin (FLOMAX) 0.4 MG CAPS capsule Take 0.4 mg by mouth daily.  0 01/17/2017 at Unknown time  . digoxin (LANOXIN) 0.25 MG tablet Take 0.25 mg by mouth daily.    Taking  . Lancets 30G MISC Use 1 Units as directed. Check CBG's up to twice daily. Dx: E11.9   Taking    Current medications: Current Facility-Administered Medications  Medication Dose Route Frequency Provider Last Rate Last Dose  . 0.9 %  sodium chloride infusion   Intravenous Continuous Gunnar Bulla, MD 50 mL/hr at 01/18/17 1530    . acetaminophen (TYLENOL) tablet 650 mg  650 mg Oral Q6H PRN Gouru, Aruna, MD       Or  . acetaminophen (TYLENOL) suppository 650 mg  650 mg Rectal Q6H PRN Gouru, Aruna, MD      . ceFAZolin (ANCEF) IVPB 2g/100 mL premix  2 g Intravenous to  XRAY Allred, Darrell K, PA-C      . HYDROcodone-acetaminophen (NORCO/VICODIN) 5-325 MG per tablet 1 tablet  1 tablet Oral Q4H PRN [name], MD   1 tablet at 01/19/17 0218  . insulin aspart (novoLOG) injection 0-9 Units  0-9 Units Subcutaneous TID WC Gouru, Aruna, MD      . metoprolol tartrate (LOPRESSOR) injection 5 mg  5 mg Intravenous Q4H PRN Gouru, Aruna, MD      . metoprolol tartrate (LOPRESSOR) tablet 25 mg  25 mg Oral BID Gouru, Aruna, MD   25 mg at 01/18/17 2139  . ondansetron (ZOFRAN) tablet 4 mg  4 mg Oral Q6H PRN Gouru, Aruna, MD       Or  . ondansetron (ZOFRAN) injection 4 mg  4 mg Intravenous Q6H PRN Gouru, Aruna, MD      . pravastatin (PRAVACHOL) tablet 20 mg  20 mg Oral q1800 Gouru, Aruna, MD   20 mg at 01/18/17 2036  . tamsulosin (FLOMAX) capsule 0.4 mg  0.4 mg Oral Daily Gouru, Aruna, MD   0.4 mg at 01/18/17 2036      Allergies: No Known Allergies     Past Medical History: Past Medical History:  Diagnosis Date  . A-fib ([place])   . Cancer [facility])    Colon  . CHF (congestive heart failure) ([place])   . Diabetes mellitus without complication ([place])   . Dyspnea   . Elevated lipids   . Hypertension   . PVD (peripheral vascular disease) ([place])      Past Surgical History: Past Surgical History:  Procedure Laterality Date  . COLON RESECTION     with colostomy  . COLONOSCOPY    . IVC FILTER REMOVAL N/A 08/28/2016   Procedure: IVC Filter Removal;  Surgeon: Katha Cabal, MD;  Location: [place] CV LAB;  Service: Cardiovascular;  Laterality: N/A;  . LOWER EXTREMITY VENOGRAPHY Left 12/18/2016   Procedure: Lower Extremity Venography;  Surgeon: Katha Cabal, MD;  Location: [place] CV LAB;  Service: Cardiovascular;  Laterality: Left;  . PERIPHERAL VASCULAR CATHETERIZATION Left 05/08/2016   Procedure: Lower Extremity Venography;  Surgeon: Katha Cabal, MD;  Location: [place] CV LAB;  Service: Cardiovascular;  Laterality: Left;     Family History: Family History  Problem Relation Age of Onset  . Diabetes Mother   . Stroke Mother   . Prostate cancer Brother   . Cancer Sister        Breast cancer  . Kidney cancer Neg Hx   . Bladder Cancer Neg Hx      Social History: Social History   Social History  . Marital status: Divorced    Spouse name: N/A  . Number of children: N/A  . Years of education: N/A   Occupational History  . Not on file.   Social History Main Topics  . Smoking status: Never Smoker  . Smokeless tobacco: Never Used  . Alcohol use No  . Drug use: No  . Sexual activity: Not on file   Other Topics Concern  . Not on file   Social History Narrative  . No narrative on file     Review of Systems: Review of Systems  Constitutional: Negative for chills, fever and malaise/fatigue.  HENT: Negative for congestion, ear pain and hearing loss.   Eyes: Negative for blurred vision and  double vision.  Respiratory: Negative for cough, hemoptysis and sputum production.   Cardiovascular: Negative for chest pain, palpitations and orthopnea.  Gastrointestinal: Negative  for heartburn, nausea and vomiting.  Genitourinary: Positive for dysuria and hematuria.  Musculoskeletal: Positive for back pain.  Skin: Negative for itching and rash.  Neurological: Negative for dizziness and seizures.  Endo/Heme/Allergies: Negative for polydipsia. Does not bruise/bleed easily.  Psychiatric/Behavioral: Negative for depression. The patient is not nervous/anxious.      Vital Signs: Blood pressure (!) 152/92, pulse (!) 56, temperature 98.2 F (36.8 C), temperature source Oral, resp. rate 16, height 5' 10.5" (1.791 m), weight 117 kg (257 lb 14.4 oz), SpO2 99 %.  Weight trends: Filed Weights   01/18/17 1100 01/18/17 1530  Weight: 118.4 kg (261 lb) 117 kg (257 lb 14.4 oz)    Physical Exam: General: NAD, resting in bed  Head: Normocephalic, atraumatic.  Eyes: Anicteric, EOMI  Nose: Mucous membranes moist, not inflammed, nonerythematous.  Throat: Oropharynx nonerythematous, no exudate appreciated.   Neck: Supple, trachea midline.  Lungs:  Normal respiratory effort. Clear to auscultation BL without crackles or wheezes.  Heart: RRR. S1 and S2 normal without gallop, murmur, or rubs.  Abdomen:  BS normoactive. Soft, Nondistended, non-tender.  Colostomy in place  Extremities: No pretibial edema.  Neurologic: A&O X3, Motor strength is 5/5 in the all 4 extremities  Skin: No visible rashes, scars.    Lab results: Basic Metabolic Panel:  Recent Labs Lab 01/16/17 1615 01/19/17 0404  NA 138 143  K 3.9 4.1  CL 104 113*  CO2 22 23  GLUCOSE 152* 115*  BUN 37* 30*  CREATININE 2.46* 2.35*  CALCIUM 9.3 8.2*    Liver Function Tests:  Recent Labs Lab 01/16/17 1615 01/19/17 0404  AST 28 20  ALT 26 15*  ALKPHOS 68 49  BILITOT 0.4 0.6  PROT 7.4 6.0*  ALBUMIN 3.5 2.8*   No results  for input(s): LIPASE, AMYLASE in the last 168 hours. No results for input(s): AMMONIA in the last 168 hours.  CBC:  Recent Labs Lab 01/16/17 1615 01/18/17 1542 01/18/17 1841 01/19/17 0404  WBC 13.9*  --   --  9.3  NEUTROABS 9.8*  --   --  6.1  HGB 13.2 12.3* 12.0* 11.5*  HCT 37.9* 35.5* 33.8* 32.6*  MCV 85.3  --   --  84.9  PLT 280  --   --  213    Cardiac Enzymes: No results for input(s): CKTOTAL, CKMB, CKMBINDEX, TROPONINI in the last 168 hours.  BNP: Invalid input(s): POCBNP  CBG:  Recent Labs Lab 01/18/17 1101 01/18/17 1222 01/18/17 2000 01/19/17 0802  GLUCAP 112* 107* 139* 120*    Microbiology: Results for orders placed or performed in visit on 01/01/17  Microscopic Examination     Status: Abnormal   Collection Time: 01/01/17  4:40 PM  Result Value Ref Range Status   WBC, UA None seen 0 - 5 /hpf Final   RBC, UA 3-10 (A) 0 - 2 /hpf Final   Epithelial Cells (non renal) None seen 0 - 10 /hpf Final   Bacteria, UA None seen None seen/Few Final  CULTURE, URINE COMPREHENSIVE     Status: Abnormal   Collection Time: 01/01/17  4:48 PM  Result Value Ref Range Status   Urine Culture, Comprehensive Final report (A)  Final   Organism ID, Bacteria Escherichia coli (A)  Final    Comment: 900 Colonies/mL Cefazolin <=4 ug/mL Cefazolin with an MIC <=16 predicts susceptibility to the oral agents cefaclor, cefdinir, cefpodoxime, cefprozil, cefuroxime, cephalexin, and loracarbef when used for therapy of uncomplicated urinary tract infections due to E. coli,  Klebsiella pneumoniae, and Proteus mirabilis.    ANTIMICROBIAL SUSCEPTIBILITY Comment  Final    Comment:       ** S = Susceptible; I = Intermediate; R = Resistant **                    P = Positive; N = Negative             MICS are expressed in micrograms per mL    Antibiotic                 RSLT#1    RSLT#2    RSLT#3    RSLT#4 Amoxicillin/Clavulanic Acid    S Ampicillin                     S Cefepime                        S Ceftriaxone                    S Cefuroxime                     S Ciprofloxacin                  S Ertapenem                      S Gentamicin                     S Imipenem                       S Levofloxacin                   S Meropenem                      S Nitrofurantoin                 S Piperacillin/Tazobactam        S Tetracycline                   S Tobramycin                     S Trimethoprim/Sulfa             S     Coagulation Studies:  Recent Labs  01/19/17 0404  LABPROT 15.0  INR 1.19    Urinalysis: No results for input(s): COLORURINE, LABSPEC, PHURINE, GLUCOSEU, HGBUR, BILIRUBINUR, KETONESUR, PROTEINUR, UROBILINOGEN, NITRITE, LEUKOCYTESUR in the last 72 hours.  Invalid input(s): APPERANCEUR    Imaging:  No results found.   Assessment & Plan: Pt is a 66 y.o. male with a PMHx of Atrial fibrillation, colon cancer, congestive heart failure, diabetes mellitus type 2 without complication, hyperlipidemia, hypertension, peripheral vascular disease, who was admitted to [facility] on 01/18/2017 for evaluation of bilateral hydronephrosis.  As an outpatient bilateral ureteral stent placement was attempted but unsuccessful.  1. Acute renal failure secondary to bilateral hydronephrosis. 2. 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 3. Hypertension. 4. Anemia unspecified. 5. Diabetes mellitus type 2.  Plan: We were consulted for the evaluation management of acute renal failure. The patient's baseline creatinine appears to be 1. In August it appears that his creatinine began rising. We are hopeful that he will regain a significant amount of renal  function once the obstruction is relieved. He is to have bilateral nephrostomies placed today. We will follow renal function closely after nephrostomies were placed. Otherwise he is still undergoing additional oncology workup. Further plan as patient presses.

## 2017-01-19 NOTE — Progress Notes (Signed)
Patient is being transported off unit at this time for procedure.

## 2017-01-19 NOTE — Progress Notes (Signed)
[facility]                                                                                                                                                                                  Patient Demographics   Beni Turrell, is a 66 y.o. male, DOB - 03/30/1951, GYJ:[id number]  Admit date - 01/18/2017   Admitting Physician [name], MD  Outpatient Primary MD for the patient is Dion Body, MD   LOS - 1  Subjective: Patient states that he is feeling fine and he is not does not have any abdominal pain nausea or vomiting Awaiting nephrostomy tube placement    Review of Systems:   CONSTITUTIONAL: No documented fever. No fatigue, weakness. No weight gain, no weight loss.  EYES: No blurry or double vision.  ENT: No tinnitus. No postnasal drip. No redness of the oropharynx.  RESPIRATORY: No cough, no wheeze, no hemoptysis. No dyspnea.  CARDIOVASCULAR: No chest pain. No orthopnea. No palpitations. No syncope.  GASTROINTESTINAL: No nausea, no vomiting or diarrhea. No abdominal pain. No melena or hematochezia.  GENITOURINARY: No dysuria or hematuria.  ENDOCRINE: No polyuria or nocturia. No heat or cold intolerance.  HEMATOLOGY: No anemia. No bruising. No bleeding.  INTEGUMENTARY: No rashes. No lesions.  MUSCULOSKELETAL: No arthritis. No swelling. No gout.  NEUROLOGIC: No numbness, tingling, or ataxia. No seizure-type activity.  PSYCHIATRIC: No anxiety. No insomnia. No ADD.    Vitals:   Vitals:   01/19/17 1421 01/19/17 1427 01/19/17 1428 01/19/17 1439  BP: (!) 163/73 (!) 175/68 (!) 161/72 (!) 173/74  Pulse: 72 78 80 77  Resp: 16 17 13 17   Temp:   99.1 F (37.3 C)   TempSrc:   Oral   SpO2: 100% 100% 100% 100%  Weight:      Height:        Wt Readings from Last 3 Encounters:  01/19/17 257 lb (116.6 kg)  01/16/17 261 lb (118.4 kg)  01/07/17 256 lb (116.1 kg)     Intake/Output Summary (Last 24 hours) at 01/19/17 1452 Last  data filed at 01/18/17 2000  Gross per 24 hour  Intake              805 ml  Output              830 ml  Net              -25 ml    Physical Exam:   GENERAL: Pleasant-appearing in no apparent distress.  HEAD, EYES, EARS, NOSE AND THROAT: Atraumatic, normocephalic. Extraocular muscles are intact. Pupils equal and reactive to light. Sclerae anicteric. No conjunctival injection. No  oro-pharyngeal erythema.  NECK: Supple. There is no jugular venous distention. No bruits, no lymphadenopathy, no thyromegaly.  HEART: Regular rate and rhythm,. No murmurs, no rubs, no clicks.  LUNGS: Clear to auscultation bilaterally. No rales or rhonchi. No wheezes.  ABDOMEN: Soft, flat, nontender, nondistended. Has good bowel sounds. No hepatosplenomegaly appreciated.  EXTREMITIES: No evidence of any cyanosis, clubbing, or peripheral edema.  +2 pedal and radial pulses bilaterally.  NEUROLOGIC: The patient is alert, awake, and oriented x3 with no focal motor or sensory deficits appreciated bilaterally.  SKIN: Moist and warm with no rashes appreciated.  Psych: Not anxious, depressed LN: No inguinal LN enlargement    Antibiotics   Anti-infectives    Start     Dose/Rate Route Frequency Ordered Stop   01/19/17 1200  ceFAZolin (ANCEF) IVPB 2g/100 mL premix     2 g 200 mL/hr over 30 Minutes Intravenous To Radiology 01/18/17 1741 01/19/17 1405   01/18/17 1048  ceFAZolin (ANCEF) 1-4 GM/50ML-% IVPB    Comments:  Lyman Bishop   : cabinet override      01/18/17 1048 01/18/17 1145   01/18/17 0145  ceFAZolin (ANCEF) IVPB 1 g/50 mL premix     1 g 100 mL/hr over 30 Minutes Intravenous 30 min pre-op 01/18/17 0145 01/18/17 1145      Medications   Scheduled Meds: . HYDROmorphone      .  HYDROmorphone (DILAUDID) injection  1 mg Intravenous Once  . insulin aspart  0-9 Units Subcutaneous TID WC  . metoprolol tartrate  25 mg Oral BID  . pravastatin  20 mg Oral q1800  . tamsulosin  0.4 mg Oral Daily   Continuous  Infusions: . sodium chloride 50 mL/hr at 01/19/17 0956   PRN Meds:.acetaminophen **OR** acetaminophen, HYDROcodone-acetaminophen, iopamidol, metoprolol tartrate, ondansetron **OR** ondansetron (ZOFRAN) IV   Data Review:   Micro Results No results found for this or any previous visit (from the past 240 hour(s)).  Radiology Reports Ct Hematuria Workup  Result Date: 01/11/2017 CLINICAL DATA:  Followup colon cancer. EXAM: CT ABDOMEN AND PELVIS WITHOUT AND WITH CONTRAST TECHNIQUE: Multidetector CT imaging of the abdomen and pelvis was performed following the standard protocol before and following the bolus administration of intravenous contrast. CONTRAST:  13mL ISOVUE-300 IOPAMIDOL (ISOVUE-300) INJECTION 61% COMPARISON:  CT scan 04/27/2016 and 08/24/2014 FINDINGS: Lower chest: The lung bases are clear of acute process. No pleural effusion or pulmonary lesions. The heart is normal in size. No pericardial effusion. Stable coronary artery calcifications. The distal esophagus and aorta are unremarkable. Hepatobiliary: No focal hepatic lesions or intrahepatic biliary dilatation the gallbladder appears normal. No common bile duct dilatation. Pancreas: No mass, inflammation or ductal dilatation. Spleen: Normal size.  No focal lesions. Adrenals/Urinary Tract: The adrenal glands are normal in stable. Bilateral hydroureteronephrosis down to enlarging pelvic mass. The right-sided hydroureteronephrosis is new and the left side is progressive. The bladder contains a Foley catheter. There is diffuse asymmetric bladder wall thickening which appears progressive. The delayed images demonstrate compression but not complete obstruction of the right ureter. Stomach/Bowel: The stomach, duodenum, small bowel and colon are grossly normal without oral contrast. No acute inflammatory changes or obstructive findings. Stable left lower quadrant colostomy. Vascular/Lymphatic: Stable atherosclerotic calcifications involving the aorta  and iliac arteries. No aneurysm. New retroperitoneal lymphadenopathy. 15 mm periaortic lymph node on image number 38. 14 mm right-sided retroperitoneal lymph node posterior to the IVC on image number 41. Left para-aortic lymph node on image number 50 measures 13.5 mm. A left  common iliac vein stent is noted. Reproductive: The prostate gland is grossly normal. The seminal vesicles are difficult to identified. Other: Enlarging presacral soft tissue mass which appears confluent with the base of the bladder. Central fluid collection appears stable. The presacral tumor is extending higher up into the pelvis where it is likely obstructing both ureters. Bilateral inguinal lymphadenopathy is new. Index node on the right side on image number 94 measures 14.5 mm an index node on the left side on image number 102 measures 17 mm. Musculoskeletal: No definite metastatic osseous disease. IMPRESSION: 1. 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 New right-sided hydroureteronephrosis with compression of the distal ureter but no complete obstruction. The left ureter appears completely obstructed. 2. New retroperitoneal lymphadenopathy. 3. No findings for bowel obstruction. Left lower quadrant colostomy is stable. 4. No definite CT findings for hepatic metastatic disease. 5. New bilateral inguinal lymphadenopathy. Electronically Signed   By: Marijo Sanes M.D.   On: 01/11/2017 13:18     CBC  Recent Labs Lab 01/16/17 1615 01/18/17 1542 01/18/17 1841 01/19/17 0404  WBC 13.9*  --   --  9.3  HGB 13.2 12.3* 12.0* 11.5*  HCT 37.9* 35.5* 33.8* 32.6*  PLT 280  --   --  213  MCV 85.3  --   --  84.9  MCH 29.7  --   --  30.0  MCHC 34.8  --   --  35.4  RDW 13.0  --   --  13.1  LYMPHSABS 1.8  --   --  1.2  MONOABS 0.8  --   --  0.8  EOSABS 1.3*  --   --  1.2*  BASOSABS 0.1  --   --  0.1    Chemistries   Recent Labs Lab 01/16/17 1615  01/19/17 0404  NA 138 143  K 3.9 4.1  CL 104 113*  CO2 22 23  GLUCOSE 152* 115*  BUN 37* 30*  CREATININE 2.46* 2.35*  CALCIUM 9.3 8.2*  AST 28 20  ALT 26 15*  ALKPHOS 68 49  BILITOT 0.4 0.6   ------------------------------------------------------------------------------------------------------------------ estimated creatinine clearance is 40.7 mL/min (A) (by C-G formula based on SCr of 2.35 mg/dL (H)). ------------------------------------------------------------------------------------------------------------------  Recent Labs  01/19/17 0404  HGBA1C 7.5*   ------------------------------------------------------------------------------------------------------------------ No results for input(s): CHOL, HDL, LDLCALC, TRIG, CHOLHDL, LDLDIRECT in the last 72 hours. ------------------------------------------------------------------------------------------------------------------ No results for input(s): TSH, T4TOTAL, T3FREE, THYROIDAB in the last 72 hours.  Invalid input(s): FREET3 ------------------------------------------------------------------------------------------------------------------ No results for input(s): VITAMINB12, FOLATE, FERRITIN, TIBC, IRON, RETICCTPCT in the last 72 hours.  Coagulation profile  Recent Labs Lab 01/19/17 0404  INR 1.19    No results for input(s): DDIMER in the last 72 hours.  Cardiac Enzymes No results for input(s): CKMB, TROPONINI, MYOGLOBIN in the last 168 hours.  Invalid input(s): CK ------------------------------------------------------------------------------------------------------------------ Invalid input(s): POCBNP    Assessment & Plan   Delon Revelo  is a 66 y.o. male with a known history of Colon cancer, diabetes with this, congestive heart failure, atrial fibrillation on eliquis is admitted to urology service for bilateral hydronephrosis and bilateral ureteral stent placement in view of renal insufficiency.pt was off eliquis  for 48 hrs, but unfortunately and cystoscopy revealed complete obliteration of the posterior bladder wall with what was likely colon cancer. Dr. Pilar Jarvis has requested on-call interventional radiology to place bilateral nephrostomy tubes as soon as possible and hospitalist team is called to admit the  patient  #Acute kidney injury from bilateral hydronephrosis Patient will need bilateral nephrostomy tube which interventional radiology will do Repeat BMP tomorrow  #Chronic atrial fibrillation Rate controlled continue Toprol Holding eliquis  # hypertension Continue home medication Toprol and hold Cozaar in view of acute renal insufficiency  # colon Cancer Status post colectomy Will need outpatient oncology follow-up  #CHF not fluid overloaded holding Lasix  #Diabetes mellitus sliding scale insulin holding by mouth medications     Code Status Orders        Start     Ordered   01/18/17 1840  Full code  Continuous     01/18/17 1840    Code Status History    Date Active Date Inactive Code Status Order ID Comments User Context   05/08/2016  6:37 PM 05/09/2016  7:34 PM Full Code [id number]  Schnier, Dolores Lory, MD Inpatient    Advance Directive Documentation     Most Recent Value  Type of Advance Directive  Healthcare Power of [place], Living will  Pre-existing out of facility DNR order (yellow form or pink MOST form)  -  "MOST" Form in Place?  -           Consults interventional radiology and nephrology   DVT Prophylaxis  scd's  Lab Results  Component Value Date   PLT 213 01/19/2017     Time Spent in minutes   10min Greater than 50% of time spent in care coordination and counseling patient regarding the condition and plan of care.   Dustin Flock M.D on 01/19/2017 at 2:52 PM  Between 7am to 6pm - Pager - [phone]  After 6pm go to [email or web address] - password [email or web address] [place] [facility]   Office  [phone]

## 2017-01-19 NOTE — Procedures (Signed)
Interventional Radiology Procedure Note  Procedure: Bilateral percutaneous nephrostomy tube placement  Complications: None  Estimated Blood Loss: 10 mL  Bilateral 10 Fr percutaneous nephrostomy tubes placed.  Draining well.  Right tube placement was more difficult and resulted in a more central puncture of collecting system.  Both tubes will be left to gravity drainage.  Venetia Night. Kathlene Cote, M.D Pager:  [phone]

## 2017-01-20 ENCOUNTER — Encounter: Payer: Self-pay | Admitting: Urology

## 2017-01-20 LAB — BASIC METABOLIC PANEL
Anion gap: 7 (ref 5–15)
BUN: 25 mg/dL — AB (ref 6–20)
CALCIUM: 8.2 mg/dL — AB (ref 8.9–10.3)
CHLORIDE: 110 mmol/L (ref 101–111)
CO2: 24 mmol/L (ref 22–32)
CREATININE: 2.09 mg/dL — AB (ref 0.61–1.24)
GFR calc Af Amer: 37 mL/min — ABNORMAL LOW (ref >60)
GFR, EST NON AFRICAN AMERICAN: 32 mL/min — AB (ref >60)
GLUCOSE: 154 mg/dL — AB (ref 65–99)
POTASSIUM: 3.9 mmol/L (ref 3.5–5.1)
Sodium: 141 mmol/L (ref 135–145)

## 2017-01-20 LAB — HIV ANTIBODY (ROUTINE TESTING W REFLEX): HIV Screen 4th Generation wRfx: NONREACTIVE

## 2017-01-20 LAB — GLUCOSE, CAPILLARY
Glucose-Capillary: 146 mg/dL — ABNORMAL HIGH (ref 65–99)
Glucose-Capillary: 158 mg/dL — ABNORMAL HIGH (ref 65–99)
Glucose-Capillary: 134 mg/dL — ABNORMAL HIGH (ref 65–99)
Glucose-Capillary: 137 mg/dL — ABNORMAL HIGH (ref 65–99)

## 2017-01-20 MED ORDER — DIGOXIN 250 MCG PO TABS
0.2500 mg | ORAL_TABLET | Freq: Every day | ORAL | Status: DC
  Administered 2017-01-20 – 2017-01-21 (×2): 0.25 mg via ORAL
  Filled 2017-01-20 (×2): qty 1

## 2017-01-20 MED ORDER — APIXABAN 5 MG PO TABS
5.0000 mg | ORAL_TABLET | Freq: Two times a day (BID) | ORAL | Status: DC
  Filled 2017-01-20: qty 1

## 2017-01-20 MED ORDER — INSULIN ASPART 100 UNIT/ML ~~LOC~~ SOLN
0.0000 [IU] | Freq: Three times a day (TID) | SUBCUTANEOUS | Status: DC
  Administered 2017-01-20 (×2): 1 [IU] via SUBCUTANEOUS
  Administered 2017-01-21: 3 [IU] via SUBCUTANEOUS
  Filled 2017-01-20 (×3): qty 1

## 2017-01-20 NOTE — Progress Notes (Signed)
  Pt doing well. No complaints. Asked about getting the foley out.  Vitals:   01/20/17 0528 01/20/17 1440  BP: (!) 157/86 (!) 148/81  Pulse: 68 74  Resp: 20 18  Temp: 98.1 F (36.7 C) 97.8 F (36.6 C)  SpO2: 97% 98%    Intake/Output Summary (Last 24 hours) at 01/20/17 1636 Last data filed at 01/20/17 1622  Gross per 24 hour  Intake          4150.17 ml  Output             4850 ml  Net          -699.83 ml    NAD Clear urine in both nephrostomy tubes  -- R > L output   Cr improved to 2.09.   A/P -  Bilateral hydro s/p bilateral nephrostomy tubes - Follow output. Initial output was better on the left. I spoke to pt's nurse and he has been irrigating the tubes on schedule and they irrigate normally without resistance.  -continue nephrostomy drainage

## 2017-01-20 NOTE — Progress Notes (Signed)
[facility]  ROUNDING NOTE   Subjective:  Patient status post bilateral nephrostomy placement. Right side is draining a good bit more than the left side. Creatinine is starting to decrease. Good urine output noted.   Objective:  Vital signs in last 24 hours:  Temp:  [97.3 F (36.3 C)-99.1 F (37.3 C)] 98.1 F (36.7 C) (09/23 0528) Pulse Rate:  [51-81] 68 (09/23 0528) Resp:  [11-20] 20 (09/23 0528) BP: (149-183)/(61-94) 157/86 (09/23 0528) SpO2:  [97 %-100 %] 97 % (09/23 0528)  Weight change: -1.814 kg (-4 lb) Filed Weights   01/18/17 1100 01/18/17 1530 01/19/17 1243  Weight: 118.4 kg (261 lb) 117 kg (257 lb 14.4 oz) 116.6 kg (257 lb)    Intake/Output: I/O last 3 completed shifts: In: 4104.2 [P.O.:1080; I.V.:1639.2; Other:1385] Out: 2800 [Urine:2800]   Intake/Output this shift:  Total I/O In: 408 [I.V.:408] Out: 1625 [Urine:1625]  Physical Exam: General: No acute distress  Head: Normocephalic, atraumatic. Moist oral mucosal membranes  Eyes: Anicteric  Neck: Supple, trachea midline  Lungs:  Clear to auscultation, normal effort  Heart: S1S2 no rubs  Abdomen:  Soft, nontender, bowel sounds present  Extremities: Trace peripheral edema.  Neurologic: Awake, alert, following commands  Skin: No lesions  GU: Bilateral nephrostomy tubes in place    Basic Metabolic Panel:  Recent Labs Lab 01/16/17 1615 01/19/17 0404 01/20/17 0446  NA 138 143 141  K 3.9 4.1 3.9  CL 104 113* 110  CO2 22 23 24   GLUCOSE 152* 115* 154*  BUN 37* 30* 25*  CREATININE 2.46* 2.35* 2.09*  CALCIUM 9.3 8.2* 8.2*    Liver Function Tests:  Recent Labs Lab 01/16/17 1615 01/19/17 0404  AST 28 20  ALT 26 15*  ALKPHOS 68 49  BILITOT 0.4 0.6  PROT 7.4 6.0*  ALBUMIN 3.5 2.8*   No results for input(s): LIPASE, AMYLASE in the last 168 hours. No results for input(s): AMMONIA in the last 168 hours.  CBC:  Recent Labs Lab 01/16/17 1615 01/18/17 1542 01/18/17 1841  01/19/17 0404  WBC 13.9*  --   --  9.3  NEUTROABS 9.8*  --   --  6.1  HGB 13.2 12.3* 12.0* 11.5*  HCT 37.9* 35.5* 33.8* 32.6*  MCV 85.3  --   --  84.9  PLT 280  --   --  213    Cardiac Enzymes: No results for input(s): CKTOTAL, CKMB, CKMBINDEX, TROPONINI in the last 168 hours.  BNP: Invalid input(s): POCBNP  CBG:  Recent Labs Lab 01/19/17 1134 01/19/17 1533 01/19/17 2037 01/20/17 0737 01/20/17 1141  GLUCAP 113* 124* 131* 146* 158*    Microbiology: Results for orders placed or performed in visit on 01/01/17  Microscopic Examination     Status: Abnormal   Collection Time: 01/01/17  4:40 PM  Result Value Ref Range Status   WBC, UA None seen 0 - 5 /hpf Final   RBC, UA 3-10 (A) 0 - 2 /hpf Final   Epithelial Cells (non renal) None seen 0 - 10 /hpf Final   Bacteria, UA None seen None seen/Few Final  CULTURE, URINE COMPREHENSIVE     Status: Abnormal   Collection Time: 01/01/17  4:48 PM  Result Value Ref Range Status   Urine Culture, Comprehensive Final report (A)  Final   Organism ID, Bacteria Escherichia coli (A)  Final    Comment: 900 Colonies/mL Cefazolin <=4 ug/mL Cefazolin with an MIC <=16 predicts susceptibility to the oral agents cefaclor, cefdinir, cefpodoxime, cefprozil, cefuroxime,  cephalexin, and loracarbef when used for therapy of uncomplicated urinary tract infections due to E. coli, Klebsiella pneumoniae, and Proteus mirabilis.    ANTIMICROBIAL SUSCEPTIBILITY Comment  Final    Comment:       ** S = Susceptible; I = Intermediate; R = Resistant **                    P = Positive; N = Negative             MICS are expressed in micrograms per mL    Antibiotic                 RSLT#1    RSLT#2    RSLT#3    RSLT#4 Amoxicillin/Clavulanic Acid    S Ampicillin                     S Cefepime                       S Ceftriaxone                    S Cefuroxime                     S Ciprofloxacin                  S Ertapenem                      S Gentamicin                      S Imipenem                       S Levofloxacin                   S Meropenem                      S Nitrofurantoin                 S Piperacillin/Tazobactam        S Tetracycline                   S Tobramycin                     S Trimethoprim/Sulfa             S     Coagulation Studies:  Recent Labs  01/19/17 0404  LABPROT 15.0  INR 1.19    Urinalysis: No results for input(s): COLORURINE, LABSPEC, PHURINE, GLUCOSEU, HGBUR, BILIRUBINUR, KETONESUR, PROTEINUR, UROBILINOGEN, NITRITE, LEUKOCYTESUR in the last 72 hours.  Invalid input(s): APPERANCEUR    Imaging: Ir Nephrostomy Placement Left  Result Date: 01/19/2017 EXAM: IR NEPHROSTOMY PLACEMENT LEFT; IR NEPHROSTOMY PLACEMENT RIGHT COMPARISON:  None. MEDICATIONS: 2 g IV Ancef; The antibiotic was administered in an appropriate time frame prior to skin puncture. ANESTHESIA/SEDATION: Fentanyl 100 mcg IV; Versed 3.0 mg IV Moderate Sedation Time:  60 minutes. The patient was continuously monitored during the procedure by the interventional radiology nurse under my direct supervision. CONTRAST:  10 mL Isovue-300 - administered into the collecting system(s) FLUOROSCOPY TIME:  Fluoroscopy Time: 1 minute.  (52 mGy). COMPLICATIONS: None immediate. PROCEDURE: Informed written consent was obtained from the patient after a thorough discussion of the procedural risks, benefits and  alternatives. All questions were addressed. Maximal Sterile Barrier Technique was utilized including caps, mask, sterile gowns, sterile gloves, sterile drape, hand hygiene and skin antiseptic. A timeout was performed prior to the initiation of the procedure. Both kidneys were localized ultrasound. Under ultrasound guidance, access of the collecting systems was performed with 21 gauge needles. Transitional dilator is replaced. Tracts were dilated over guidewires and 10 French nephrostomy tubes were advanced bilaterally. Both tubes were injected with contrast to  confirm position and fluoroscopic images saved. The catheters were connected to gravity bags and secured at the skin with Prolene retention sutures and StatLock devices. FINDINGS: There is significant hydronephrosis on the left. After lower pole access, a nephrostomy tube was advanced into the central collecting system and is draining well after placement. Right-sided hydronephrosis is present, to a lesser degree than on the left. Puncture of the right kidney was more difficult by ultrasound guidance, resulting in a more central puncture of the collecting system. A nephrostomy tube was advanced into the collecting system such that it is partially formed and extends just into the proximal ureter. The tube is draining well after placement. IMPRESSION: Placement of bilateral percutaneous nephrostomy tubes. Bilateral 10 French nephrostomy tubes were placed and attached to gravity bag drainage. Electronically Signed   By: Aletta Edouard M.D.   On: 01/19/2017 15:11   Ir Nephrostomy Placement Right  Result Date: 01/19/2017 EXAM: IR NEPHROSTOMY PLACEMENT LEFT; IR NEPHROSTOMY PLACEMENT RIGHT COMPARISON:  None. MEDICATIONS: 2 g IV Ancef; The antibiotic was administered in an appropriate time frame prior to skin puncture. ANESTHESIA/SEDATION: Fentanyl 100 mcg IV; Versed 3.0 mg IV Moderate Sedation Time:  60 minutes. The patient was continuously monitored during the procedure by the interventional radiology nurse under my direct supervision. CONTRAST:  10 mL Isovue-300 - administered into the collecting system(s) FLUOROSCOPY TIME:  Fluoroscopy Time: 1 minute.  (52 mGy). COMPLICATIONS: None immediate. PROCEDURE: Informed written consent was obtained from the patient after a thorough discussion of the procedural risks, benefits and alternatives. All questions were addressed. Maximal Sterile Barrier Technique was utilized including caps, mask, sterile gowns, sterile gloves, sterile drape, hand hygiene and skin antiseptic. A  timeout was performed prior to the initiation of the procedure. Both kidneys were localized ultrasound. Under ultrasound guidance, access of the collecting systems was performed with 21 gauge needles. Transitional dilator is replaced. Tracts were dilated over guidewires and 10 French nephrostomy tubes were advanced bilaterally. Both tubes were injected with contrast to confirm position and fluoroscopic images saved. The catheters were connected to gravity bags and secured at the skin with Prolene retention sutures and StatLock devices. FINDINGS: There is significant hydronephrosis on the left. After lower pole access, a nephrostomy tube was advanced into the central collecting system and is draining well after placement. Right-sided hydronephrosis is present, to a lesser degree than on the left. Puncture of the right kidney was more difficult by ultrasound guidance, resulting in a more central puncture of the collecting system. A nephrostomy tube was advanced into the collecting system such that it is partially formed and extends just into the proximal ureter. The tube is draining well after placement. IMPRESSION: Placement of bilateral percutaneous nephrostomy tubes. Bilateral 10 French nephrostomy tubes were placed and attached to gravity bag drainage. Electronically Signed   By: Aletta Edouard M.D.   On: 01/19/2017 15:11     Medications:   . sodium chloride 50 mL/hr at 01/20/17 0236   . insulin aspart  0-9 Units Subcutaneous TID WC  .  metoprolol tartrate  25 mg Oral BID  . pravastatin  20 mg Oral q1800  . sodium chloride flush  5 mL Intravenous Q8H  . tamsulosin  0.4 mg Oral Daily   acetaminophen **OR** acetaminophen, HYDROcodone-acetaminophen, iopamidol, metoprolol tartrate, ondansetron **OR** ondansetron (ZOFRAN) IV  Assessment/ Plan:  66 y.o. male with a PMHx of Atrial fibrillation, colon cancer, congestive heart failure, diabetes mellitus type 2 without complication, hyperlipidemia,  hypertension, peripheral vascular disease, who was admitted to [facility] on 01/18/2017 for evaluation of bilateral hydronephrosis.  As an outpatient bilateral ureteral stent placement was attempted but unsuccessful.  1. Acute renal failure secondary to bilateral hydronephrosis, status post bilateral nephrostomy placement on 01/19/17. 2. 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 3. Hypertension. 4. Anemia unspecified. 5. Diabetes mellitus type 2.  Plan: Renal function has started to improve.  Creatinine down to 2.09.  Urine output was 2.8 L over the preceding 24 hours.  Patient has significantly more urine output out of the right nephrostomy as compared to the left.  Continue to monitor this clinically.  Oncology also following on the case.  In [place] appears to be relatively stable at 11.5.  Would not administer Epogen in this patient given underlying malignancy.  Recommend continued monitoring of renal function as well as urine output he remains hospitalized.  LOS: 2 Machi Whittaker 9/23/20181:54 PM

## 2017-01-20 NOTE — Progress Notes (Signed)
[place] at [facility]                                                                                                                                                                                  Patient Demographics   Donald Townsend, is a 66 y.o. male, DOB - [birth year], YEB:[id number]  Admit date - 01/18/2017   Admitting Physician [name], MD  Outpatient Primary MD for the patient is Dion Body, MD   LOS - 2  Subjective: Patient's feeling well denies any complaints  Review of Systems:   CONSTITUTIONAL: No documented fever. No fatigue, weakness. No weight gain, no weight loss.  EYES: No blurry or double vision.  ENT: No tinnitus. No postnasal drip. No redness of the oropharynx.  RESPIRATORY: No cough, no wheeze, no hemoptysis. No dyspnea.  CARDIOVASCULAR: No chest pain. No orthopnea. No palpitations. No syncope.  GASTROINTESTINAL: No nausea, no vomiting or diarrhea. No abdominal pain. No melena or hematochezia.  GENITOURINARY: No dysuria or hematuria.  ENDOCRINE: No polyuria or nocturia. No heat or cold intolerance.  HEMATOLOGY: No anemia. No bruising. No bleeding.  INTEGUMENTARY: No rashes. No lesions.  MUSCULOSKELETAL: No arthritis. No swelling. No gout.  NEUROLOGIC: No numbness, tingling, or ataxia. No seizure-type activity.  PSYCHIATRIC: No anxiety. No insomnia. No ADD.    Vitals:   Vitals:   01/19/17 1609 01/19/17 1641 01/19/17 2040 01/20/17 0528  BP: (!) 159/94 (!) 164/77 (!) 153/82 (!) 157/86  Pulse: (!) 51 (!) 58 76 68  Resp: 16 16 18 20   Temp: 98.1 F (36.7 C) (!) 97.4 F (36.3 C) 97.7 F (36.5 C) 98.1 F (36.7 C)  TempSrc: Oral Oral Oral Oral  SpO2: 100% 100% 98% 97%  Weight:      Height:        Wt Readings from Last 3 Encounters:  01/19/17 257 lb (116.6 kg)  01/16/17 261 lb (118.4 kg)  01/07/17 256 lb (116.1 kg)     Intake/Output Summary (Last 24 hours) at 01/20/17 1404 Last data filed at 01/20/17  1403  Gross per 24 hour  Intake          4150.17 ml  Output             4425 ml  Net          -274.83 ml    Physical Exam:   GENERAL: Pleasant-appearing in no apparent distress.  HEAD, EYES, EARS, NOSE AND THROAT: Atraumatic, normocephalic. Extraocular muscles are intact. Pupils equal and reactive to light. Sclerae anicteric. No conjunctival injection. No oro-pharyngeal erythema.  NECK: Supple. There is no jugular venous distention. No bruits, no lymphadenopathy, no thyromegaly.  HEART: Regular rate and rhythm,. No murmurs, no rubs, no clicks.  LUNGS: Clear to auscultation bilaterally. No rales or rhonchi. No wheezes.  ABDOMEN: Soft, flat, nontender, nondistended. Has good bowel sounds. No hepatosplenomegaly appreciated.  EXTREMITIES: No evidence of any cyanosis, clubbing, or peripheral edema.  +2 pedal and radial pulses bilaterally.  NEUROLOGIC: The patient is alert, awake, and oriented x3 with no focal motor or sensory deficits appreciated bilaterally.  SKIN: Moist and warm with no rashes appreciated.  Psych: Not anxious, depressed LN: No inguinal LN enlargement    Antibiotics   Anti-infectives    Start     Dose/Rate Route Frequency Ordered Stop   01/19/17 1200  ceFAZolin (ANCEF) IVPB 2g/100 mL premix     2 g 200 mL/hr over 30 Minutes Intravenous To Radiology 01/18/17 1741 01/19/17 1405   01/18/17 1048  ceFAZolin (ANCEF) 1-4 GM/50ML-% IVPB    Comments:  Kizziah, Kaitlin   : cabinet override      01/18/17 1048 01/18/17 1145   01/18/17 0145  ceFAZolin (ANCEF) IVPB 1 g/50 mL premix     1 g 100 mL/hr over 30 Minutes Intravenous 30 min pre-op 01/18/17 0145 01/18/17 1145      Medications   Scheduled Meds: . insulin aspart  0-9 Units Subcutaneous TID WC  . metoprolol tartrate  25 mg Oral BID  . pravastatin  20 mg Oral q1800  . sodium chloride flush  5 mL Intravenous Q8H  . tamsulosin  0.4 mg Oral Daily   Continuous Infusions: . sodium chloride 50 mL/hr at 01/20/17 0236    PRN Meds:.acetaminophen **OR** acetaminophen, HYDROcodone-acetaminophen, iopamidol, metoprolol tartrate, ondansetron **OR** ondansetron (ZOFRAN) IV   Data Review:   Micro Results No results found for this or any previous visit (from the past 240 hour(s)).  Radiology Reports Ct Hematuria Workup  Result Date: 01/11/2017 CLINICAL DATA:  Followup colon cancer. EXAM: CT ABDOMEN AND PELVIS WITHOUT AND WITH CONTRAST TECHNIQUE: Multidetector CT imaging of the abdomen and pelvis was performed following the standard protocol before and following the bolus administration of intravenous contrast. CONTRAST:  162mL ISOVUE-300 IOPAMIDOL (ISOVUE-300) INJECTION 61% COMPARISON:  CT scan 04/27/2016 and 08/24/2014 FINDINGS: Lower chest: The lung bases are clear of acute process. No pleural effusion or pulmonary lesions. The heart is normal in size. No pericardial effusion. Stable coronary artery calcifications. The distal esophagus and aorta are unremarkable. Hepatobiliary: No focal hepatic lesions or intrahepatic biliary dilatation the gallbladder appears normal. No common bile duct dilatation. Pancreas: No mass, inflammation or ductal dilatation. Spleen: Normal size.  No focal lesions. Adrenals/Urinary Tract: The adrenal glands are normal in stable. Bilateral hydroureteronephrosis down to enlarging pelvic mass. The right-sided hydroureteronephrosis is new and the left side is progressive. The bladder contains a Foley catheter. There is diffuse asymmetric bladder wall thickening which appears progressive. The delayed images demonstrate compression but not complete obstruction of the right ureter. Stomach/Bowel: The stomach, duodenum, small bowel and colon are grossly normal without oral contrast. No acute inflammatory changes or obstructive findings. Stable left lower quadrant colostomy. Vascular/Lymphatic: Stable atherosclerotic calcifications involving the aorta and iliac arteries. No aneurysm. New retroperitoneal  lymphadenopathy. 15 mm periaortic lymph node on image number 38. 14 mm right-sided retroperitoneal lymph node posterior to the IVC on image number 41. Left para-aortic lymph node on image number 50 measures 13.5 mm. A left common iliac vein stent is noted. Reproductive: The prostate gland is grossly normal. The seminal vesicles are difficult to identified. Other: Enlarging presacral soft tissue mass  which appears confluent with the base of the bladder. Central fluid collection appears stable. The presacral tumor is extending higher up into the pelvis where it is likely obstructing both ureters. Bilateral inguinal lymphadenopathy is new. Index node on the right side on image number 94 measures 14.5 mm an index node on the left side on image number 102 measures 17 mm. Musculoskeletal: No definite metastatic osseous disease. IMPRESSION: 1. 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 New right-sided hydroureteronephrosis with compression of the distal ureter but no complete obstruction. The left ureter appears completely obstructed. 2. New retroperitoneal lymphadenopathy. 3. No findings for bowel obstruction. Left lower quadrant colostomy is stable. 4. No definite CT findings for hepatic metastatic disease. 5. New bilateral inguinal lymphadenopathy. Electronically Signed   By: Marijo Sanes M.D.   On: 01/11/2017 13:18   Ir Nephrostomy Placement Left  Result Date: 01/19/2017 EXAM: IR NEPHROSTOMY PLACEMENT LEFT; IR NEPHROSTOMY PLACEMENT RIGHT COMPARISON:  None. MEDICATIONS: 2 g IV Ancef; The antibiotic was administered in an appropriate time frame prior to skin puncture. ANESTHESIA/SEDATION: Fentanyl 100 mcg IV; Versed 3.0 mg IV Moderate Sedation Time:  60 minutes. The patient was continuously monitored during the procedure by the interventional radiology nurse under my direct supervision. CONTRAST:  10 mL Isovue-300 - administered into the  collecting system(s) FLUOROSCOPY TIME:  Fluoroscopy Time: 1 minute.  (52 mGy). COMPLICATIONS: None immediate. PROCEDURE: Informed written consent was obtained from the patient after a thorough discussion of the procedural risks, benefits and alternatives. All questions were addressed. Maximal Sterile Barrier Technique was utilized including caps, mask, sterile gowns, sterile gloves, sterile drape, hand hygiene and skin antiseptic. A timeout was performed prior to the initiation of the procedure. Both kidneys were localized ultrasound. Under ultrasound guidance, access of the collecting systems was performed with 21 gauge needles. Transitional dilator is replaced. Tracts were dilated over guidewires and 10 French nephrostomy tubes were advanced bilaterally. Both tubes were injected with contrast to confirm position and fluoroscopic images saved. The catheters were connected to gravity bags and secured at the skin with Prolene retention sutures and StatLock devices. FINDINGS: There is significant hydronephrosis on the left. After lower pole access, a nephrostomy tube was advanced into the central collecting system and is draining well after placement. Right-sided hydronephrosis is present, to a lesser degree than on the left. Puncture of the right kidney was more difficult by ultrasound guidance, resulting in a more central puncture of the collecting system. A nephrostomy tube was advanced into the collecting system such that it is partially formed and extends just into the proximal ureter. The tube is draining well after placement. IMPRESSION: Placement of bilateral percutaneous nephrostomy tubes. Bilateral 10 French nephrostomy tubes were placed and attached to gravity bag drainage. Electronically Signed   By: Aletta Edouard M.D.   On: 01/19/2017 15:11   Ir Nephrostomy Placement Right  Result Date: 01/19/2017 EXAM: IR NEPHROSTOMY PLACEMENT LEFT; IR NEPHROSTOMY PLACEMENT RIGHT COMPARISON:  None. MEDICATIONS: 2 g  IV Ancef; The antibiotic was administered in an appropriate time frame prior to skin puncture. ANESTHESIA/SEDATION: Fentanyl 100 mcg IV; Versed 3.0 mg IV Moderate Sedation Time:  60 minutes. The patient was continuously monitored during the procedure by the interventional radiology nurse under my direct supervision. CONTRAST:  10 mL Isovue-300 - administered into the collecting system(s) FLUOROSCOPY TIME:  Fluoroscopy Time: 1 minute.  (52 mGy). COMPLICATIONS: None immediate. PROCEDURE: Informed written consent was obtained  from the patient after a thorough discussion of the procedural risks, benefits and alternatives. All questions were addressed. Maximal Sterile Barrier Technique was utilized including caps, mask, sterile gowns, sterile gloves, sterile drape, hand hygiene and skin antiseptic. A timeout was performed prior to the initiation of the procedure. Both kidneys were localized ultrasound. Under ultrasound guidance, access of the collecting systems was performed with 21 gauge needles. Transitional dilator is replaced. Tracts were dilated over guidewires and 10 French nephrostomy tubes were advanced bilaterally. Both tubes were injected with contrast to confirm position and fluoroscopic images saved. The catheters were connected to gravity bags and secured at the skin with Prolene retention sutures and StatLock devices. FINDINGS: There is significant hydronephrosis on the left. After lower pole access, a nephrostomy tube was advanced into the central collecting system and is draining well after placement. Right-sided hydronephrosis is present, to a lesser degree than on the left. Puncture of the right kidney was more difficult by ultrasound guidance, resulting in a more central puncture of the collecting system. A nephrostomy tube was advanced into the collecting system such that it is partially formed and extends just into the proximal ureter. The tube is draining well after placement. IMPRESSION: Placement  of bilateral percutaneous nephrostomy tubes. Bilateral 10 French nephrostomy tubes were placed and attached to gravity bag drainage. Electronically Signed   By: Aletta Edouard M.D.   On: 01/19/2017 15:11     CBC  Recent Labs Lab 01/16/17 1615 01/18/17 1542 01/18/17 1841 01/19/17 0404  WBC 13.9*  --   --  9.3  HGB 13.2 12.3* 12.0* 11.5*  HCT 37.9* 35.5* 33.8* 32.6*  PLT 280  --   --  213  MCV 85.3  --   --  84.9  MCH 29.7  --   --  30.0  MCHC 34.8  --   --  35.4  RDW 13.0  --   --  13.1  LYMPHSABS 1.8  --   --  1.2  MONOABS 0.8  --   --  0.8  EOSABS 1.3*  --   --  1.2*  BASOSABS 0.1  --   --  0.1    Chemistries   Recent Labs Lab 01/16/17 1615 01/19/17 0404 01/20/17 0446  NA 138 143 141  K 3.9 4.1 3.9  CL 104 113* 110  CO2 22 23 24   GLUCOSE 152* 115* 154*  BUN 37* 30* 25*  CREATININE 2.46* 2.35* 2.09*  CALCIUM 9.3 8.2* 8.2*  AST 28 20  --   ALT 26 15*  --   ALKPHOS 68 49  --   BILITOT 0.4 0.6  --    ------------------------------------------------------------------------------------------------------------------ estimated creatinine clearance is 45.8 mL/min (A) (by C-G formula based on SCr of 2.09 mg/dL (H)). ------------------------------------------------------------------------------------------------------------------  Recent Labs  01/19/17 0404  HGBA1C 7.5*   ------------------------------------------------------------------------------------------------------------------ No results for input(s): CHOL, HDL, LDLCALC, TRIG, CHOLHDL, LDLDIRECT in the last 72 hours. ------------------------------------------------------------------------------------------------------------------ No results for input(s): TSH, T4TOTAL, T3FREE, THYROIDAB in the last 72 hours.  Invalid input(s): FREET3 ------------------------------------------------------------------------------------------------------------------ No results for input(s): VITAMINB12, FOLATE, FERRITIN, TIBC,  IRON, RETICCTPCT in the last 72 hours.  Coagulation profile  Recent Labs Lab 01/19/17 0404  INR 1.19    No results for input(s): DDIMER in the last 72 hours.  Cardiac Enzymes No results for input(s): CKMB, TROPONINI, MYOGLOBIN in the last 168 hours.  Invalid input(s): CK ------------------------------------------------------------------------------------------------------------------ Invalid input(s): [place]   Rainier Feuerborn  is a 66 y.o.  male with a known history of Colon cancer, diabetes with this, congestive heart failure, atrial fibrillation on eliquis is admitted to urology service for bilateral hydronephrosis and bilateral ureteral stent placement in view of renal insufficiency.pt was off eliquis for 48 hrs, but unfortunately and cystoscopy revealed complete obliteration of the posterior bladder wall with what was likely colon cancer. Dr. Pilar Jarvis has requested on-call interventional radiology to place bilateral nephrostomy tubes as soon as possible and hospitalist team is called to admit the patient  #Acute kidney injury from bilateral hydronephrosis Patient status post bilateral nephrostomy tube by interventional radiology   Renal function improved  #Chronic atrial fibrillation Rate controlled continue Toprol Resume eliquis  # hypertension Continue home medication Toprol and hold Cozaar in view of acute renal insufficiency  # colon Cancer Status post colectomy Will need outpatient oncology follow-up  #CHF not fluid overloaded holding Lasix  #Diabetes mellitus sliding scale insulin holding by mouth medications     Code Status Orders        Start     Ordered   01/18/17 1840  Full code  Continuous     01/18/17 1840    Code Status History    Date Active Date Inactive Code Status Order ID Comments User Context   05/08/2016  6:37 PM 05/09/2016  7:34 PM Full Code [id number]  Schnier, Dolores Lory, MD Inpatient    Advance Directive Documentation      Most Recent Value  Type of Advance Directive  Healthcare Power of [place], Living will  Pre-existing out of facility DNR order (yellow form or pink MOST form)  -  "MOST" Form in Place?  -           Consults interventional radiology and nephrology   DVT Prophylaxis  scd's  Lab Results  Component Value Date   PLT 213 01/19/2017     Time Spent in minutes   66min Greater than 50% of time spent in care coordination and counseling patient regarding the condition and plan of care.   Dustin Flock M.D on 01/20/2017 at 2:04 PM  Between 7am to 6pm - Pager - [phone]  After 6pm go to [email or web address] - password [email or web address] [facility] [place] [facility]   Office  [phone]

## 2017-01-21 ENCOUNTER — Other Ambulatory Visit: Payer: PRIVATE HEALTH INSURANCE

## 2017-01-21 DIAGNOSIS — R1909 Other intra-abdominal and pelvic swelling, mass and lump: Secondary | ICD-10-CM

## 2017-01-21 DIAGNOSIS — N131 Hydronephrosis with ureteral stricture, not elsewhere classified: Secondary | ICD-10-CM

## 2017-01-21 LAB — BASIC METABOLIC PANEL
Anion gap: 7 (ref 5–15)
BUN: 25 mg/dL — AB (ref 6–20)
CALCIUM: 8.3 mg/dL — AB (ref 8.9–10.3)
CO2: 24 mmol/L (ref 22–32)
CREATININE: 2.05 mg/dL — AB (ref 0.61–1.24)
Chloride: 110 mmol/L (ref 101–111)
GFR calc Af Amer: 37 mL/min — ABNORMAL LOW (ref >60)
GFR, EST NON AFRICAN AMERICAN: 32 mL/min — AB (ref >60)
Glucose, Bld: 143 mg/dL — ABNORMAL HIGH (ref 65–99)
POTASSIUM: 4.1 mmol/L (ref 3.5–5.1)
SODIUM: 141 mmol/L (ref 135–145)

## 2017-01-21 LAB — GLUCOSE, CAPILLARY
GLUCOSE-CAPILLARY: 120 mg/dL — AB (ref 65–99)
Glucose-Capillary: 208 mg/dL — ABNORMAL HIGH (ref 65–99)

## 2017-01-21 MED ORDER — HYDROCODONE-ACETAMINOPHEN 5-325 MG PO TABS: 1.0000 | ORAL_TABLET | ORAL | 0 refills | Status: DC | PRN

## 2017-01-21 NOTE — Care Management (Signed)
Patient discharged home today with nephrostomy tubes.  Home health order was written for RN care to maintain flushing of nephrostomy tubes TID.  MD notified that this would no be covered by insurance, as it is not a skillable needs.  Bedside RN educated patient and family prior to discharge.  Son to complete morning flushes, friend to complete mid day flushes, and daughter to complete pm flushes.  No other RNCM needs identified.

## 2017-01-21 NOTE — Telephone Encounter (Signed)
Would they be able to do inguinal lymph node biopsy while he is an inpatient or do they have to wait on PET? He has been off Eliquis.

## 2017-01-21 NOTE — Discharge Instructions (Signed)
[place] at [place]:  Diabetic diet  DISCHARGE CONDITION:  Stable  ACTIVITY:  Activity as tolerated  OXYGEN:  Home Oxygen: No.   Oxygen Delivery: room air  DISCHARGE LOCATION:  home    ADDITIONAL DISCHARGE INSTRUCTION: nephrostomy tube care as per intervential radiology and urology   If you experience worsening of your admission symptoms, develop shortness of breath, life threatening emergency, suicidal or homicidal thoughts you must seek medical attention immediately by calling 911 or calling your MD immediately  if symptoms less severe.  You Must read complete instructions/literature along with all the possible adverse reactions/side effects for all the Medicines you take and that have been prescribed to you. Take any new Medicines after you have completely understood and accpet all the possible adverse reactions/side effects.   Please note  You were cared for by a hospitalist during your hospital stay. If you have any questions about your discharge medications or the care you received while you were in the hospital after you are discharged, you can call the unit and asked to speak with the hospitalist on call if the hospitalist that took care of you is not available. Once you are discharged, your primary care physician will handle any further medical issues. Please note that NO REFILLS for any discharge medications will be authorized once you are discharged, as it is imperative that you return to your primary care physician (or establish a relationship with a primary care physician if you do not have one) for your aftercare needs so that they can reassess your need for medications and monitor your lab values.

## 2017-01-21 NOTE — Progress Notes (Signed)
Urology Consult Follow Up  Subjective: Anxious for discharge today. Right nephrostomy tube was very large output, 2175 cc versus left only 700 cc. Right nephrostomy tube clear, left pink tinged but draining well.    Anti-infectives: Anti-infectives    Start     Dose/Rate Route Frequency Ordered Stop   01/19/17 1200  ceFAZolin (ANCEF) IVPB 2g/100 mL premix     2 g 200 mL/hr over 30 Minutes Intravenous To Radiology 01/18/17 1741 01/19/17 1405   01/18/17 1048  ceFAZolin (ANCEF) 1-4 GM/50ML-% IVPB    Comments:  Lyman Bishop   : cabinet override      01/18/17 1048 01/18/17 1145   01/18/17 0145  ceFAZolin (ANCEF) IVPB 1 g/50 mL premix     1 g 100 mL/hr over 30 Minutes Intravenous 30 min pre-op 01/18/17 0145 01/18/17 1145      Current Facility-Administered Medications  Medication Dose Route Frequency Provider Last Rate Last Dose  . 0.9 %  sodium chloride infusion   Intravenous Continuous Gunnar Bulla, MD 50 mL/hr at 01/20/17 2246    . acetaminophen (TYLENOL) tablet 650 mg  650 mg Oral Q6H PRN Gouru, Aruna, MD       Or  . acetaminophen (TYLENOL) suppository 650 mg  650 mg Rectal Q6H PRN Gouru, Aruna, MD      . apixaban (ELIQUIS) tablet 5 mg  5 mg Oral BID Dustin Flock, MD   Stopped at 01/20/17 1415  . digoxin (LANOXIN) tablet 0.25 mg  0.25 mg Oral Daily Dustin Flock, MD   0.25 mg at 01/21/17 1052  . HYDROcodone-acetaminophen (NORCO/VICODIN) 5-325 MG per tablet 1 tablet  1 tablet Oral Q4H PRN [name], MD   1 tablet at 01/21/17 0256  . insulin aspart (novoLOG) injection 0-9 Units  0-9 Units Subcutaneous TID AC & HS Dustin Flock, MD   3 Units at 01/21/17 1224  . iopamidol (ISOVUE-300) 61 % injection 30 mL  30 mL Intravenous Once PRN Aletta Edouard, MD      . metoprolol tartrate (LOPRESSOR) injection 5 mg  5 mg Intravenous Q4H PRN Gouru, Aruna, MD      . metoprolol tartrate (LOPRESSOR) tablet 25 mg  25 mg Oral BID Gouru, Aruna, MD   25 mg at 01/21/17 1052  .  ondansetron (ZOFRAN) tablet 4 mg  4 mg Oral Q6H PRN Gouru, Aruna, MD       Or  . ondansetron (ZOFRAN) injection 4 mg  4 mg Intravenous Q6H PRN Gouru, Aruna, MD      . pravastatin (PRAVACHOL) tablet 20 mg  20 mg Oral q1800 Gouru, Aruna, MD   20 mg at 01/20/17 1732  . sodium chloride flush (NS) 0.9 % injection 5 mL  5 mL Intravenous Q8H Aletta Edouard, MD   5 mL at 01/21/17 0845  . tamsulosin (FLOMAX) capsule 0.4 mg  0.4 mg Oral Daily Gouru, Aruna, MD   0.4 mg at 01/21/17 1052     Objective: Vital signs in last 24 hours: Temp:  [97.8 F (36.6 C)-98.8 F (37.1 C)] 98.3 F (36.8 C) (09/24 1221) Pulse Rate:  [69-90] 90 (09/24 1221) Resp:  [18-20] 20 (09/24 0346) BP: (148-154)/(77-92) 150/92 (09/24 1221) SpO2:  [98 %-100 %] 99 % (09/24 1221)  Intake/Output from previous day: 09/23 0701 - 09/24 0700 In: 1604 [P.O.:360; I.V.:1234] Out: 4175 [Urine:4175] Intake/Output this shift: Total I/O In: 765 [P.O.:240; Other:525] Out: 1000 [Urine:1000]   Physical Exam  Constitutional: He is oriented to person, place, and time and well-developed,  well-nourished, and in no distress.  Daughter at bedside  Genitourinary:  Genitourinary Comments: Nephrostomy tube sites clean dry and intact. Right tube draining clear area Milbert Coulter, left draining light pink urine without clot.  Neurological: He is oriented to person, place, and time.  Skin: Skin is warm and dry.  Psychiatric: Affect normal.  Vitals reviewed.   Lab Results:   Recent Labs  01/18/17 1841 01/19/17 0404  WBC  --  9.3  HGB 12.0* 11.5*  HCT 33.8* 32.6*  PLT  --  213   BMET  Recent Labs  01/20/17 0446 01/21/17 0812  NA 141 141  K 3.9 4.1  CL 110 110  CO2 24 24  GLUCOSE 154* 143*  BUN 25* 25*  CREATININE 2.09* 2.05*  CALCIUM 8.2* 8.3*   PT/INR  Recent Labs  01/19/17 0404  LABPROT 15.0  INR 1.19   Studies/Results: Ir Nephrostomy Placement Left  Result Date: 01/19/2017 EXAM: IR NEPHROSTOMY PLACEMENT LEFT; IR  NEPHROSTOMY PLACEMENT RIGHT COMPARISON:  None. MEDICATIONS: 2 g IV Ancef; The antibiotic was administered in an appropriate time frame prior to skin puncture. ANESTHESIA/SEDATION: Fentanyl 100 mcg IV; Versed 3.0 mg IV Moderate Sedation Time:  60 minutes. The patient was continuously monitored during the procedure by the interventional radiology nurse under my direct supervision. CONTRAST:  10 mL Isovue-300 - administered into the collecting system(s) FLUOROSCOPY TIME:  Fluoroscopy Time: 1 minute.  (52 mGy). COMPLICATIONS: None immediate. PROCEDURE: Informed written consent was obtained from the patient after a thorough discussion of the procedural risks, benefits and alternatives. All questions were addressed. Maximal Sterile Barrier Technique was utilized including caps, mask, sterile gowns, sterile gloves, sterile drape, hand hygiene and skin antiseptic. A timeout was performed prior to the initiation of the procedure. Both kidneys were localized ultrasound. Under ultrasound guidance, access of the collecting systems was performed with 21 gauge needles. Transitional dilator is replaced. Tracts were dilated over guidewires and 10 French nephrostomy tubes were advanced bilaterally. Both tubes were injected with contrast to confirm position and fluoroscopic images saved. The catheters were connected to gravity bags and secured at the skin with Prolene retention sutures and StatLock devices. FINDINGS: There is significant hydronephrosis on the left. After lower pole access, a nephrostomy tube was advanced into the central collecting system and is draining well after placement. Right-sided hydronephrosis is present, to a lesser degree than on the left. Puncture of the right kidney was more difficult by ultrasound guidance, resulting in a more central puncture of the collecting system. A nephrostomy tube was advanced into the collecting system such that it is partially formed and extends just into the proximal ureter.  The tube is draining well after placement. IMPRESSION: Placement of bilateral percutaneous nephrostomy tubes. Bilateral 10 French nephrostomy tubes were placed and attached to gravity bag drainage. Electronically Signed   By: Aletta Edouard M.D.   On: 01/19/2017 15:11   Ir Nephrostomy Placement Right  Result Date: 01/19/2017 EXAM: IR NEPHROSTOMY PLACEMENT LEFT; IR NEPHROSTOMY PLACEMENT RIGHT COMPARISON:  None. MEDICATIONS: 2 g IV Ancef; The antibiotic was administered in an appropriate time frame prior to skin puncture. ANESTHESIA/SEDATION: Fentanyl 100 mcg IV; Versed 3.0 mg IV Moderate Sedation Time:  60 minutes. The patient was continuously monitored during the procedure by the interventional radiology nurse under my direct supervision. CONTRAST:  10 mL Isovue-300 - administered into the collecting system(s) FLUOROSCOPY TIME:  Fluoroscopy Time: 1 minute.  (52 mGy). COMPLICATIONS: None immediate. PROCEDURE: Informed written consent was obtained from the patient  after a thorough discussion of the procedural risks, benefits and alternatives. All questions were addressed. Maximal Sterile Barrier Technique was utilized including caps, mask, sterile gowns, sterile gloves, sterile drape, hand hygiene and skin antiseptic. A timeout was performed prior to the initiation of the procedure. Both kidneys were localized ultrasound. Under ultrasound guidance, access of the collecting systems was performed with 21 gauge needles. Transitional dilator is replaced. Tracts were dilated over guidewires and 10 French nephrostomy tubes were advanced bilaterally. Both tubes were injected with contrast to confirm position and fluoroscopic images saved. The catheters were connected to gravity bags and secured at the skin with Prolene retention sutures and StatLock devices. FINDINGS: There is significant hydronephrosis on the left. After lower pole access, a nephrostomy tube was advanced into the central collecting system and is  draining well after placement. Right-sided hydronephrosis is present, to a lesser degree than on the left. Puncture of the right kidney was more difficult by ultrasound guidance, resulting in a more central puncture of the collecting system. A nephrostomy tube was advanced into the collecting system such that it is partially formed and extends just into the proximal ureter. The tube is draining well after placement. IMPRESSION: Placement of bilateral percutaneous nephrostomy tubes. Bilateral 10 French nephrostomy tubes were placed and attached to gravity bag drainage. Electronically Signed   By: Aletta Edouard M.D.   On: 01/19/2017 15:11   A/P -   1. Bilateral hydronephrosis- now status post bilateral nephrostomy tube placement, draining well, right greater than left Continue flushing left tube until clears   2. Acute on chronic renal insufficiency-creatinine stable today from yesterday, excellent urine output  3. Pelvic mass- oncology following, scheduled for PET scan tomorrow and  Inguinal LN biopsy in near future.   We will devise follow-up plan for nephrostomy tubes once oncologic plan is fully established.  Urology will sign off, please arrange for outpatient follow-up in 2 weeks to discuss plan for tube exchange   LOS: 3 days    Hollice Espy 01/21/2017

## 2017-01-21 NOTE — Progress Notes (Signed)
Pt discharged per MD order. Pts IV removed. Discharge paperwork reviewed with pt and daughter. Prescription given to pt. Pts daughter instructed on how to flush nephrostomy tubes. Daughter said they will be able to manage at home. Pt taken to car via wheelchair.

## 2017-01-21 NOTE — Care Management Important Message (Signed)
Important Message  Patient Details  Name: Donald Townsend MRN: [id number] Date of Birth: 01-02-1951   Medicare Important Message Given:  Yes    Beverly Sessions, RN 01/21/2017, 2:55 PM

## 2017-01-21 NOTE — Discharge Summary (Signed)
Donald Townsend at [facility], 66 y.o., DOB 1950-08-03, MRN [id number]. Admission date: 01/18/2017 Discharge Date 01/21/2017 Primary MD Dion Body, MD Admitting Physician [name], MD  Admission Diagnosis  BILATERAL URETERAL OBSTRUCTION hydronephrosis; renal failure  Discharge Diagnosis   Active Problems:   AKI (acute kidney injury) ([place]) from bilateral hydronephrosis   Hydronephrosis with ureteral stricture, with possible bladder wall invasion   Colon cancer Chronic atrial fibrillation   History of CHF type unknown Essential hypertension Diabetes type 2       Hospital Course patient is a [age]-year-old with history of colon cancer, diabetes and congestive heart failure and atrial fibrillation who was admitted by urology for bilateral hydronephrosis and plan for bilateral ureteral stent placement. When patient underwent cystoscopy revealed complete obstruction of the posterior bladder wall which was felt to be due to colon cancer. Urology recommended admission for the patient to the hospital due to acute renal failure and nephrostomy tube placement by interventional radiology. Patient was given IV fluids and had nephrostomy tubes placed with improvement in his renal function. Patient will need to follow-up with his oncologist and urologist for further treatment. Patient is planning to have a biopsy done therefore is eloquent since held. He was also having some blood in his urine and therefore eloquent cortices held for now.            Consults  urology, nephrology  Significant Tests:  See full reports for all details     Ct Hematuria Workup  Result Date: 01/11/2017 CLINICAL DATA:  Followup colon cancer. EXAM: CT ABDOMEN AND PELVIS WITHOUT AND WITH CONTRAST TECHNIQUE: Multidetector CT imaging of the abdomen and pelvis was performed following the standard protocol before and following the bolus administration of intravenous  contrast. CONTRAST:  172mL ISOVUE-300 IOPAMIDOL (ISOVUE-300) INJECTION 61% COMPARISON:  CT scan 04/27/2016 and 08/24/2014 FINDINGS: Lower chest: The lung bases are clear of acute process. No pleural effusion or pulmonary lesions. The heart is normal in size. No pericardial effusion. Stable coronary artery calcifications. The distal esophagus and aorta are unremarkable. Hepatobiliary: No focal hepatic lesions or intrahepatic biliary dilatation the gallbladder appears normal. No common bile duct dilatation. Pancreas: No mass, inflammation or ductal dilatation. Spleen: Normal size.  No focal lesions. Adrenals/Urinary Tract: The adrenal glands are normal in stable. Bilateral hydroureteronephrosis down to enlarging pelvic mass. The right-sided hydroureteronephrosis is new and the left side is progressive. The bladder contains a Foley catheter. There is diffuse asymmetric bladder wall thickening which appears progressive. The delayed images demonstrate compression but not complete obstruction of the right ureter. Stomach/Bowel: The stomach, duodenum, small bowel and colon are grossly normal without oral contrast. No acute inflammatory changes or obstructive findings. Stable left lower quadrant colostomy. Vascular/Lymphatic: Stable atherosclerotic calcifications involving the aorta and iliac arteries. No aneurysm. New retroperitoneal lymphadenopathy. 15 mm periaortic lymph node on image number 38. 14 mm right-sided retroperitoneal lymph node posterior to the IVC on image number 41. Left para-aortic lymph node on image number 50 measures 13.5 mm. A left common iliac vein stent is noted. Reproductive: The prostate gland is grossly normal. The seminal vesicles are difficult to identified. Other: Enlarging presacral soft tissue mass which appears confluent with the base of the bladder. Central fluid collection appears stable. The presacral tumor is extending higher up into the pelvis where it is likely obstructing both  ureters. Bilateral inguinal lymphadenopathy is new. Index node on the right side on image number 94 measures  14.5 mm an index node on the left side on image number 102 measures 17 mm. Musculoskeletal: No definite metastatic osseous disease. IMPRESSION: 1. 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 New right-sided hydroureteronephrosis with compression of the distal ureter but no complete obstruction. The left ureter appears completely obstructed. 2. New retroperitoneal lymphadenopathy. 3. No findings for bowel obstruction. Left lower quadrant colostomy is stable. 4. No definite CT findings for hepatic metastatic disease. 5. New bilateral inguinal lymphadenopathy. Electronically Signed   By: Marijo Sanes M.D.   On: 01/11/2017 13:18   Ir Nephrostomy Placement Left  Result Date: 01/19/2017 EXAM: IR NEPHROSTOMY PLACEMENT LEFT; IR NEPHROSTOMY PLACEMENT RIGHT COMPARISON:  None. MEDICATIONS: 2 g IV Ancef; The antibiotic was administered in an appropriate time frame prior to skin puncture. ANESTHESIA/SEDATION: Fentanyl 100 mcg IV; Versed 3.0 mg IV Moderate Sedation Time:  60 minutes. The patient was continuously monitored during the procedure by the interventional radiology nurse under my direct supervision. CONTRAST:  10 mL Isovue-300 - administered into the collecting system(s) FLUOROSCOPY TIME:  Fluoroscopy Time: 1 minute.  (52 mGy). COMPLICATIONS: None immediate. PROCEDURE: Informed written consent was obtained from the patient after a thorough discussion of the procedural risks, benefits and alternatives. All questions were addressed. Maximal Sterile Barrier Technique was utilized including caps, mask, sterile gowns, sterile gloves, sterile drape, hand hygiene and skin antiseptic. A timeout was performed prior to the initiation of the procedure. Both kidneys were localized ultrasound. Under ultrasound guidance, access of the collecting  systems was performed with 21 gauge needles. Transitional dilator is replaced. Tracts were dilated over guidewires and 10 French nephrostomy tubes were advanced bilaterally. Both tubes were injected with contrast to confirm position and fluoroscopic images saved. The catheters were connected to gravity bags and secured at the skin with Prolene retention sutures and StatLock devices. FINDINGS: There is significant hydronephrosis on the left. After lower pole access, a nephrostomy tube was advanced into the central collecting system and is draining well after placement. Right-sided hydronephrosis is present, to a lesser degree than on the left. Puncture of the right kidney was more difficult by ultrasound guidance, resulting in a more central puncture of the collecting system. A nephrostomy tube was advanced into the collecting system such that it is partially formed and extends just into the proximal ureter. The tube is draining well after placement. IMPRESSION: Placement of bilateral percutaneous nephrostomy tubes. Bilateral 10 French nephrostomy tubes were placed and attached to gravity bag drainage. Electronically Signed   By: Aletta Edouard M.D.   On: 01/19/2017 15:11   Ir Nephrostomy Placement Right  Result Date: 01/19/2017 EXAM: IR NEPHROSTOMY PLACEMENT LEFT; IR NEPHROSTOMY PLACEMENT RIGHT COMPARISON:  None. MEDICATIONS: 2 g IV Ancef; The antibiotic was administered in an appropriate time frame prior to skin puncture. ANESTHESIA/SEDATION: Fentanyl 100 mcg IV; Versed 3.0 mg IV Moderate Sedation Time:  60 minutes. The patient was continuously monitored during the procedure by the interventional radiology nurse under my direct supervision. CONTRAST:  10 mL Isovue-300 - administered into the collecting system(s) FLUOROSCOPY TIME:  Fluoroscopy Time: 1 minute.  (52 mGy). COMPLICATIONS: None immediate. PROCEDURE: Informed written consent was obtained from the patient after a thorough discussion of the procedural  risks, benefits and alternatives. All questions were addressed. Maximal Sterile Barrier Technique was utilized including caps, mask, sterile gowns, sterile gloves, sterile drape, hand hygiene and skin antiseptic. A timeout was performed prior to the initiation of  the procedure. Both kidneys were localized ultrasound. Under ultrasound guidance, access of the collecting systems was performed with 21 gauge needles. Transitional dilator is replaced. Tracts were dilated over guidewires and 10 French nephrostomy tubes were advanced bilaterally. Both tubes were injected with contrast to confirm position and fluoroscopic images saved. The catheters were connected to gravity bags and secured at the skin with Prolene retention sutures and StatLock devices. FINDINGS: There is significant hydronephrosis on the left. After lower pole access, a nephrostomy tube was advanced into the central collecting system and is draining well after placement. Right-sided hydronephrosis is present, to a lesser degree than on the left. Puncture of the right kidney was more difficult by ultrasound guidance, resulting in a more central puncture of the collecting system. A nephrostomy tube was advanced into the collecting system such that it is partially formed and extends just into the proximal ureter. The tube is draining well after placement. IMPRESSION: Placement of bilateral percutaneous nephrostomy tubes. Bilateral 10 French nephrostomy tubes were placed and attached to gravity bag drainage. Electronically Signed   By: Aletta Edouard M.D.   On: 01/19/2017 15:11       Today   Subjective:   Donald Townsend  Patient feeling better   Blood pressure (!) 150/92, pulse 90, temperature 98.3 F (36.8 C), temperature source Oral, resp. rate 20, height 5\' 11"  (1.803 m), weight 257 lb (116.6 kg), SpO2 99 %.  .  Intake/Output Summary (Last 24 hours) at 01/21/17 1511 Last data filed at 01/21/17 1227  Gross per 24 hour  Intake             1883  ml  Output             3575 ml  Net            -1692 ml    Exam VITAL SIGNS: Blood pressure (!) 150/92, pulse 90, temperature 98.3 F (36.8 C), temperature source Oral, resp. rate 20, height 5\' 11"  (1.803 m), weight 257 lb (116.6 kg), SpO2 99 %.  GENERAL:  66 y.o.-year-old patient lying in the bed with no acute distress.  EYES: Pupils equal, round, reactive to light and accommodation. No scleral icterus. Extraocular muscles intact.  HEENT: Head atraumatic, normocephalic. Oropharynx and nasopharynx clear.  NECK:  Supple, no jugular venous distention. No thyroid enlargement, no tenderness.  LUNGS: Normal breath sounds bilaterally, no wheezing, rales,rhonchi or crepitation. No use of accessory muscles of respiration.  CARDIOVASCULAR: S1, S2 normal. No murmurs, rubs, or gallops.  ABDOMEN: Soft, nontender, nondistended. Bowel sounds present. No organomegaly or mass.  EXTREMITIES: No pedal edema, cyanosis, or clubbing.  NEUROLOGIC: Cranial nerves II through XII are intact. Muscle strength 5/5 in all extremities. Sensation intact. Gait not checked.  PSYCHIATRIC: The patient is alert and oriented x 3.  SKIN: No obvious rash, lesion, or ulcer.   Data Review     CBC w Diff: Lab Results  Component Value Date   WBC 9.3 01/19/2017   HGB 11.5 (L) 01/19/2017   HGB 13.9 12/17/2012   HCT 32.6 (L) 01/19/2017   HCT 38.8 (L) 12/17/2012   PLT 213 01/19/2017   PLT 147 (L) 12/17/2012   LYMPHOPCT 13 01/19/2017   LYMPHOPCT 13.2 12/17/2012   MONOPCT 8 01/19/2017   MONOPCT 8.6 12/17/2012   EOSPCT 13 01/19/2017   EOSPCT 1.1 12/17/2012   BASOPCT 1 01/19/2017   BASOPCT 0.6 12/17/2012   CMP: Lab Results  Component Value Date   NA 141 01/21/2017  NA 139 12/17/2012   K 4.1 01/21/2017   K 3.8 12/17/2012   CL 110 01/21/2017   CL 108 (H) 12/17/2012   CO2 24 01/21/2017   CO2 26 12/17/2012   BUN 25 (H) 01/21/2017   BUN 21 01/01/2017   BUN 15 12/17/2012   CREATININE 2.05 (H) 01/21/2017    CREATININE 0.84 12/17/2012   PROT 6.0 (L) 01/19/2017   PROT 5.8 (L) 12/17/2012   ALBUMIN 2.8 (L) 01/19/2017   ALBUMIN 2.9 (L) 12/17/2012   BILITOT 0.6 01/19/2017   BILITOT 0.7 12/17/2012   ALKPHOS 49 01/19/2017   ALKPHOS 78 12/17/2012   AST 20 01/19/2017   AST 16 12/17/2012   ALT 15 (L) 01/19/2017   ALT 23 12/17/2012  .  Micro Results No results found for this or any previous visit (from the past 240 hour(s)).      Code Status Orders        Start     Ordered   01/18/17 1840  Full code  Continuous     01/18/17 1840    Code Status History    Date Active Date Inactive Code Status Order ID Comments User Context   05/08/2016  6:37 PM 05/09/2016  7:34 PM Full Code [id number]  Schnier, Dolores Lory, MD Inpatient    Advance Directive Documentation     Most Recent Value  Type of Advance Directive  Healthcare Power of Attorney, Living will  Pre-existing out of facility DNR order (yellow form or pink MOST form)  -  "MOST" Form in Place?  -          Follow-up Information    [name], MD. Go on 01/24/2017.   Specialty:  Urology Why:  Thursday at 2:15pm for urstomy tubes placed Contact information: [address] [address] [phone]        primary oncologist.   Why:  as scheduled          Discharge Medications   Allergies as of 01/21/2017   No Known Allergies     Medication List    STOP taking these medications   ELIQUIS 5 MG Tabs tablet Generic drug:  apixaban   furosemide 40 MG tablet Commonly known as:  LASIX   metFORMIN 1000 MG tablet Commonly known as:  GLUCOPHAGE     TAKE these medications   aspirin EC 81 MG tablet Take 81 mg by mouth daily.   digoxin 0.25 MG tablet Commonly known as:  LANOXIN Take 0.25 mg by mouth daily.   glimepiride 4 MG tablet Commonly known as:  AMARYL Take 4 mg by mouth 2 (two) times daily.   HYDROcodone-acetaminophen 5-325 MG tablet Commonly known as:  NORCO/VICODIN Take 1  tablet by mouth every 4 (four) hours as needed for moderate pain.   insulin aspart 100 UNIT/ML FlexPen Commonly known as:  NOVOLOG Inject 3 Units into the skin.   Lancets 30G Misc Use 1 Units as directed. Check CBG's up to twice daily. Dx: E11.9   LANTUS SOLOSTAR 100 UNIT/ML Solostar Pen Generic drug:  Insulin Glargine Inject 10 Units into the skin at bedtime.   losartan 100 MG tablet Commonly known as:  COZAAR take 100mg  by mouth once daily   metoprolol tartrate 25 MG tablet Commonly known as:  LOPRESSOR Take 25 mg by mouth 2 (two) times daily.   pravastatin 20 MG tablet Commonly known as:  PRAVACHOL take 20mg  by mouth daily at bedtime   tamsulosin 0.4 MG Caps  capsule Commonly known as:  FLOMAX Take 0.4 mg by mouth daily.            Discharge Care Instructions        Start     Ordered   01/21/17 0000  HYDROcodone-acetaminophen (NORCO/VICODIN) 5-325 MG tablet  Every 4 hours PRN     01/21/17 1137         Total Time in preparing paper work, data evaluation and todays exam - 35 minutes  Dustin Flock M.D on 01/21/2017 at 3:11 PM  [facility]   Office  [phone]

## 2017-01-21 NOTE — Progress Notes (Signed)
Donald Townsend   DOB:07/15/1950   ZS#:[id number]    Subjective: Patient status post nephrostomy tubes over the weekend. Creatinine improving to a 2.05. Complaints of back pain at the site of tube placement. Otherwise denies any nausea vomiting chest pain or shortness of breath or cough.  Objective:  Vitals:   01/21/17 0346 01/21/17 1221  BP: (!) 154/82 (!) 150/92  Pulse: 79 90  Resp: 20   Temp: 98.6 F (37 C) 98.3 F (36.8 C)  SpO2: 100% 99%     Intake/Output Summary (Last 24 hours) at 01/21/17 2045 Last data filed at 01/21/17 1227  Gross per 24 hour  Intake             1479 ml  Output             2925 ml  Net            -1446 ml    GENERAL Alert, no distress and comfortable. O2 EYES: no pallor or icterus OROPHARYNX: no thrush or ulceration. NECK: supple, no masses felt LYMPH:  no palpable lymphadenopathy in the cervical, axillary or inguinal regions LUNGS: decreased breath sounds to auscultation at bases and  No wheeze or crackles HEART/CVS: regular rate & rhythm and no murmurs; No lower extremity edema ABDOMEN: abdomen soft, tender  on deep palpation. and normal bowel sounds; Bilateral percutaneous nephrostomy tubes in place. Musculoskeletal:no cyanosis of digits and no clubbing  PSYCH: alert & oriented x 3 with fluent speech NEURO: no focal motor/sensory deficits SKIN:  no rashes or significant lesions   Labs:  Lab Results  Component Value Date   WBC 9.3 01/19/2017   HGB 11.5 (L) 01/19/2017   HCT 32.6 (L) 01/19/2017   MCV 84.9 01/19/2017   PLT 213 01/19/2017   NEUTROABS 6.1 01/19/2017    Lab Results  Component Value Date   NA 141 01/21/2017   K 4.1 01/21/2017   CL 110 01/21/2017   CO2 24 01/21/2017    Studies:  No results found.  Assessment & Plan:  # 66 year old male patient with prior history of rectal cancer- with concerns for recurrence noted on the CT scan/ also bilateral hydronephrosis/acute renal failure.  # Acute renal failure/ bilateral  ureteral obstruction secondary to the pre-sacral tumor. S/p percutaneous nephrostomy tubes. Creatinine improved to 2.05. Appreciate nephrology/urology recommendations.  # Rectal cancer- with concerns for recurrence noted on the imaging- presacral fluid collection/bilateral inguinal adenopathy/retroperitoneal adenopathy. Elevated CEA at 136. Recommend biopsy of the inguinal lymph nodes to confirm the diagnosis. Patient will also need a PET scan; which planned outpatient On 25th. We will plan biopsy after the PET scan is done.  # Left LE DVT on Elilquis. Patient will likely need anticoagulation indefinite/especially given diagnosis of recurrent malignancy.   # Discussed with Dr.Smith regarding the concerning findings/likely recurrent cancer; need for a port placement.   Cammie Sickle, MD 01/21/2017  8:45 PM

## 2017-01-22 ENCOUNTER — Encounter (INDEPENDENT_AMBULATORY_CARE_PROVIDER_SITE_OTHER): Payer: Self-pay | Admitting: Vascular Surgery

## 2017-01-22 ENCOUNTER — Ambulatory Visit: Admission: RE | Admit: 2017-01-22 | Payer: Medicare Other | Source: Ambulatory Visit

## 2017-01-22 ENCOUNTER — Encounter: Payer: Self-pay | Admitting: Urology

## 2017-01-22 ENCOUNTER — Telehealth: Payer: Self-pay | Admitting: Internal Medicine

## 2017-01-22 ENCOUNTER — Encounter: Payer: Self-pay | Admitting: Internal Medicine

## 2017-01-22 ENCOUNTER — Encounter
Admission: RE | Admit: 2017-01-22 | Discharge: 2017-01-22 | Disposition: A | Discharge status: Home / Self Care | Payer: Medicare Other | Source: Ambulatory Visit | Attending: Internal Medicine | Admitting: Internal Medicine

## 2017-01-22 DIAGNOSIS — C20 Malignant neoplasm of rectum: Secondary | ICD-10-CM | POA: Insufficient documentation

## 2017-01-22 DIAGNOSIS — C218 Malignant neoplasm of overlapping sites of rectum, anus and anal canal: Secondary | ICD-10-CM | POA: Diagnosis not present

## 2017-01-22 LAB — GLUCOSE, CAPILLARY: Glucose-Capillary: 145 mg/dL — ABNORMAL HIGH (ref 65–99)

## 2017-01-22 MED ORDER — FLUDEOXYGLUCOSE F - 18 (FDG) INJECTION
13.0300 | Freq: Once | INTRAVENOUS | Status: AC | PRN
  Administered 2017-01-22: 13.03 via INTRAVENOUS

## 2017-01-22 NOTE — Telephone Encounter (Signed)
Donald Dun- I reviewed the PET scan; I would recommend ultrasound guided biopsy of the right inguinal lymph node. Please have this scheduled ASAP.   Also he needs a port- check with IR if he can get port the same time of his [place] Bx- so that he does not have to wait to come off eliquis again.   We can decide on starting of anticoagulation- based upon the timing of his ultrasound biopsy;port placement.    Please inform pt/daughter.   Please schedule appt with me few days after the Biopsy. Thx

## 2017-01-22 NOTE — Telephone Encounter (Signed)
Donald Townsend- pt was d/c from [facility] yesterday

## 2017-01-23 ENCOUNTER — Telehealth: Payer: Self-pay

## 2017-01-23 ENCOUNTER — Other Ambulatory Visit: Payer: Self-pay | Admitting: Radiology

## 2017-01-23 ENCOUNTER — Other Ambulatory Visit: Payer: Self-pay

## 2017-01-23 ENCOUNTER — Encounter: Payer: Self-pay | Admitting: Vascular Surgery

## 2017-01-23 DIAGNOSIS — R590 Localized enlarged lymph nodes: Secondary | ICD-10-CM

## 2017-01-23 DIAGNOSIS — C20 Malignant neoplasm of rectum: Secondary | ICD-10-CM

## 2017-01-23 NOTE — Telephone Encounter (Signed)
I have notified Pamala Hurry in special scheduling regarding need for port placement at the same time as lymph node biopsy. Pet is still being reviewed for approval of biopsy by IR. Order placed for port placement by IR.

## 2017-01-23 NOTE — Telephone Encounter (Signed)
  Oncology Nurse Navigator Documentation Notified Mr. Cabiness of appointment 9/26 with arrival time of 0900 at the medical mall at [facility] for lymph node biopsy and port a cath placement. Verified he has remained of his Eliquis. Per Dr. Rogue Bussing he can resume his Eliquis on Friday, 9/27. Instructed not to eat or drink anything after midnight and that he will need a driver. Special procedures nurse will call him with further instructions. Read back performed. Navigator Location: [facility] (01/23/17 1000)   )Navigator Encounter Type: Telephone (01/23/17 1000) Telephone: [place] Call;Appt Confirmation/Clarification (01/23/17 1000)                                                  Time Spent with Patient: 15 (01/23/17 1000)

## 2017-01-23 NOTE — Telephone Encounter (Signed)
  Oncology Nurse Navigator Documentation Called and spoke with Mr. Applegate. Notified him to continue to hold his aspirin as well as Eliquis until Friday per Dr. Rogue Bussing. He described a golf ball size amount of bright red blood that came from his penis after foley catheter removal. None since that episode. He will see Urology 9/27 for hospital follow up. Bilateral nephrostomies functioning with one side clear and the other cloudy. Denies and signs of blood in the bags. Navigator Location: [facility] (01/23/17 1500)   )Navigator Encounter Type: Telephone (01/23/17 1500) Telephone: [place] Call (01/23/17 1500)                                                  Time Spent with Patient: 15 (01/23/17 1500)

## 2017-01-24 ENCOUNTER — Encounter: Payer: Self-pay | Admitting: Urology

## 2017-01-24 ENCOUNTER — Other Ambulatory Visit: Payer: Self-pay | Admitting: *Deleted

## 2017-01-24 ENCOUNTER — Ambulatory Visit
Admission: RE | Admit: 2017-01-24 | Discharge: 2017-01-24 | Disposition: A | Discharge status: Home / Self Care | Payer: Medicare Other | Source: Ambulatory Visit | Attending: Internal Medicine | Admitting: Internal Medicine

## 2017-01-24 ENCOUNTER — Telehealth: Payer: Self-pay | Admitting: Internal Medicine

## 2017-01-24 ENCOUNTER — Ambulatory Visit (INDEPENDENT_AMBULATORY_CARE_PROVIDER_SITE_OTHER): Payer: Medicare Other | Admitting: Urology

## 2017-01-24 VITALS — BP 108/67 | HR 105 | Ht 70.0 in | Wt 258.0 lb

## 2017-01-24 DIAGNOSIS — C20 Malignant neoplasm of rectum: Secondary | ICD-10-CM

## 2017-01-24 DIAGNOSIS — C774 Secondary and unspecified malignant neoplasm of inguinal and lower limb lymph nodes: Secondary | ICD-10-CM | POA: Insufficient documentation

## 2017-01-24 DIAGNOSIS — Z85048 Personal history of other malignant neoplasm of rectum, rectosigmoid junction, and anus: Secondary | ICD-10-CM | POA: Insufficient documentation

## 2017-01-24 DIAGNOSIS — R590 Localized enlarged lymph nodes: Secondary | ICD-10-CM

## 2017-01-24 DIAGNOSIS — N1330 Unspecified hydronephrosis: Secondary | ICD-10-CM

## 2017-01-24 DIAGNOSIS — Z452 Encounter for adjustment and management of vascular access device: Secondary | ICD-10-CM | POA: Diagnosis not present

## 2017-01-24 DIAGNOSIS — Z5111 Encounter for antineoplastic chemotherapy: Secondary | ICD-10-CM | POA: Diagnosis not present

## 2017-01-24 DIAGNOSIS — C775 Secondary and unspecified malignant neoplasm of intrapelvic lymph nodes: Secondary | ICD-10-CM | POA: Diagnosis not present

## 2017-01-24 HISTORY — PX: IR FLUORO GUIDE PORT INSERTION RIGHT: IMG5741

## 2017-01-24 LAB — CBC
HCT: 38 % — ABNORMAL LOW (ref 40.0–52.0)
Hemoglobin: 13 g/dL (ref 13.0–18.0)
MCH: 29.1 pg (ref 26.0–34.0)
MCHC: 34.2 g/dL (ref 32.0–36.0)
MCV: 85.3 fL (ref 80.0–100.0)
PLATELETS: 265 10*3/uL (ref 150–440)
RBC: 4.45 MIL/uL (ref 4.40–5.90)
RDW: 13.4 % (ref 11.5–14.5)
WBC: 13.3 10*3/uL — AB (ref 3.8–10.6)

## 2017-01-24 LAB — PROTIME-INR
INR: 1.11
PROTHROMBIN TIME: 14.2 s (ref 11.4–15.2)

## 2017-01-24 LAB — APTT: aPTT: 36 seconds (ref 24–36)

## 2017-01-24 LAB — GLUCOSE, CAPILLARY: GLUCOSE-CAPILLARY: 134 mg/dL — AB (ref 65–99)

## 2017-01-24 MED ORDER — MIDAZOLAM HCL 2 MG/2ML IJ SOLN
INTRAMUSCULAR | Status: AC | PRN
  Administered 2017-01-24 (×2): 1 mg via INTRAVENOUS

## 2017-01-24 MED ORDER — MIDAZOLAM HCL 2 MG/2ML IJ SOLN
INTRAMUSCULAR | Status: AC
  Filled 2017-01-24: qty 2

## 2017-01-24 MED ORDER — FENTANYL CITRATE (PF) 100 MCG/2ML IJ SOLN
INTRAMUSCULAR | Status: AC
  Filled 2017-01-24: qty 2

## 2017-01-24 MED ORDER — HEPARIN SOD (PORK) LOCK FLUSH 100 UNIT/ML IV SOLN
INTRAVENOUS | Status: AC
Start: 2017-01-24 — End: 2017-01-24
  Filled 2017-01-24: qty 5

## 2017-01-24 MED ORDER — FENTANYL CITRATE (PF) 100 MCG/2ML IJ SOLN
INTRAMUSCULAR | Status: AC | PRN
  Administered 2017-01-24: 25 ug via INTRAVENOUS
  Administered 2017-01-24 (×2): 50 ug via INTRAVENOUS

## 2017-01-24 MED ORDER — MIDAZOLAM HCL 2 MG/2ML IJ SOLN
INTRAMUSCULAR | Status: AC | PRN
  Administered 2017-01-24: 1 mg via INTRAVENOUS

## 2017-01-24 MED ORDER — DEXTROSE 5 % IV SOLN
2.0000 g | INTRAVENOUS | Status: AC
  Administered 2017-01-24: 2 g via INTRAVENOUS
  Filled 2017-01-24: qty 2000

## 2017-01-24 MED ORDER — FENTANYL CITRATE (PF) 100 MCG/2ML IJ SOLN
INTRAMUSCULAR | Status: AC | PRN
  Administered 2017-01-24: 25 ug via INTRAVENOUS

## 2017-01-24 MED ORDER — SODIUM CHLORIDE 0.9 % IV SOLN: INTRAVENOUS | Status: DC

## 2017-01-24 MED ORDER — CEFAZOLIN SODIUM-DEXTROSE 2-4 GM/100ML-% IV SOLN: 2.0000 g | INTRAVENOUS | Status: DC

## 2017-01-24 MED ORDER — LIDOCAINE-EPINEPHRINE (PF) 2 %-1:200000 IJ SOLN
INTRAMUSCULAR | Status: AC
  Filled 2017-01-24: qty 20

## 2017-01-24 NOTE — Telephone Encounter (Signed)
Have pt follow up with me on 10/01 at 2:00 pm/labs- cbc/bmp [please order]. Thx

## 2017-01-24 NOTE — Discharge Instructions (Signed)
Needle Biopsy, Care After Refer to this sheet in the next few weeks. These instructions provide you with information about caring for yourself after your procedure. Your health care provider may also give you more specific instructions. Your treatment has been planned according to current medical practices, but problems sometimes occur. Call your health care provider if you have any problems or questions after your procedure. What can I expect after the procedure? After your procedure, it is common to have soreness, bruising, or mild pain at the biopsy site. This should go away in a few days. Follow these instructions at home:  Rest as directed by your health care provider.  Take medicines only as directed by your health care provider.  There are many different ways to close and cover the biopsy site, including stitches (sutures), skin glue, and adhesive strips. Follow your health care provider's instructions about: ? Biopsy site care. ? Bandage (dressing) changes and removal. ? Biopsy site closure removal.  Check your biopsy site every day for signs of infection. Watch for: ? Redness, swelling, or pain. ? Fluid, blood, or pus. Contact a health care provider if:  You have a fever.  You have redness, swelling, or pain at the biopsy site that lasts longer than a few days.  You have fluid, blood, or pus coming from the biopsy site.  You feel nauseous.  You vomit. Get help right away if:  You have shortness of breath.  You have trouble breathing.  You have chest pain.  You feel dizzy or you faint.  You have bleeding that does not stop with pressure or a bandage.  You cough up blood.  You have pain in your abdomen. This information is not intended to replace advice given to you by your health care provider. Make sure you discuss any questions you have with your health care provider. Document Released: 08/31/2014 Document Revised: 09/22/2015 Document Reviewed:  04/12/2014 Elsevier Interactive Patient Education  2018 [place] Insertion, Care After This sheet gives you information about how to care for yourself after your procedure. Your health care provider may also give you more specific instructions. If you have problems or questions, contact your health care provider. What can I expect after the procedure? After your procedure, it is common to have:  Discomfort at the port insertion site.  Bruising on the skin over the port. This should improve over 3-4 days.  Follow these instructions at home: [place] care  After your port is placed, you will get a manufacturer's information card. The card has information about your port. Keep this card with you at all times.  Take care of the port as told by your health care provider. Ask your health care provider if you or a family member can get training for taking care of the port at home. A home health care nurse may also take care of the port.  Make sure to remember what type of port you have. Incision care  Follow instructions from your health care provider about how to take care of your port insertion site. Make sure you: ? Wash your hands with soap and water before you change your bandage (dressing). If soap and water are not available, use hand sanitizer. ? Change your dressing as told by your health care provider. ? Leave stitches (sutures), skin glue, or adhesive strips in place. These skin closures may need to stay in place for 2 weeks or longer. If adhesive strip edges start to loosen and curl  up, you may trim the loose edges. Do not remove adhesive strips completely unless your health care provider tells you to do that.  Check your port insertion site every day for signs of infection. Check for: ? More redness, swelling, or pain. ? More fluid or blood. ? Warmth. ? Pus or a bad smell. General instructions  Do not take baths, swim, or use a hot tub until your health care  provider approves.  Do not lift anything that is heavier than 10 lb (4.5 kg) for a week, or as told by your health care provider.  Ask your health care provider when it is okay to: ? Return to work or school. ? Resume usual physical activities or sports.  Do not drive for 24 hours if you were given a medicine to help you relax (sedative).  Take over-the-counter and prescription medicines only as told by your health care provider.  Wear a medical alert bracelet in case of an emergency. This will tell any health care providers that you have a port.  Keep all follow-up visits as told by your health care provider. This is important. Contact a health care provider if:  You cannot flush your port with saline as directed, or you cannot draw blood from the port.  You have a fever or chills.  You have more redness, swelling, or pain around your port insertion site.  You have more fluid or blood coming from your port insertion site.  Your port insertion site feels warm to the touch.  You have pus or a bad smell coming from the port insertion site. Get help right away if:  You have chest pain or shortness of breath.  You have bleeding from your port that you cannot control. Summary  Take care of the port as told by your health care provider.  Change your dressing as told by your health care provider.  Keep all follow-up visits as told by your health care provider. This information is not intended to replace advice given to you by your health care provider. Make sure you discuss any questions you have with your health care provider. Document Released: 02/04/2013 Document Revised: 03/07/2016 Document Reviewed: 03/07/2016 Elsevier Interactive Patient Education  2017 [employer].

## 2017-01-24 NOTE — Procedures (Signed)
Placement of right jugular port.  Tip at SVC/RA junction.  Minimal blood loss and no immediate complication.

## 2017-01-24 NOTE — Consult Note (Signed)
Chief Complaint: Patient was seen in consultation today for Port-A-Cath placement and lymph node biopsy at the request of Brahmanday,Govinda R  Referring Physician(s): Brahmanday,Govinda R  Patient Status: [facility] - Out-pt  History of Present Illness: Donald Townsend is a 66 y.o. male with a complex medical history including history of rectal cancer. Patient was recently in the hospital for acute renal failure and hydronephrosis. Bilateral percutaneous nephrostomy tubes were placed. Imaging workup demonstrated diffuse metastatic disease with extensive lymphadenopathy and obstruction of the ureters at the bladder. Patient needs a tissue diagnosis and Port-A-Cath placement for treatment. Patient has no complaints today. Patient had a right chest Port-A-Cath in the past. Patient has been off Eliquis for 4 days.  Past Medical History:  Diagnosis Date  . A-fib ([place])   . Cancer [facility])    Colon  . CHF (congestive heart failure) ([place])   . Diabetes mellitus without complication ([place])   . Dyspnea   . Elevated lipids   . Hypertension   . PVD (peripheral vascular disease) ([place])     Past Surgical History:  Procedure Laterality Date  . COLON RESECTION     with colostomy  . COLONOSCOPY    . CYSTOSCOPY WITH STENT PLACEMENT Bilateral 01/18/2017   Procedure: CYSTOSCOPY WITH STENT PLACEMENT;  Surgeon: [name], MD;  Location: [facility] ORS;  Service: Urology;  Laterality: Bilateral;  . IR NEPHROSTOMY PLACEMENT LEFT  01/19/2017  . IR NEPHROSTOMY PLACEMENT RIGHT  01/19/2017  . IVC FILTER REMOVAL N/A 08/28/2016   Procedure: IVC Filter Removal;  Surgeon: Katha Cabal, MD;  Location: [place] CV LAB;  Service: Cardiovascular;  Laterality: N/A;  . LOWER EXTREMITY VENOGRAPHY Left 12/18/2016   Procedure: Lower Extremity Venography;  Surgeon: Katha Cabal, MD;  Location: [place] CV LAB;  Service: Cardiovascular;  Laterality: Left;  . PERIPHERAL VASCULAR CATHETERIZATION Left  05/08/2016   Procedure: Lower Extremity Venography;  Surgeon: Katha Cabal, MD;  Location: [place] CV LAB;  Service: Cardiovascular;  Laterality: Left;    Allergies: Patient has no known allergies.  Medications: Prior to Admission medications   Medication Sig Start Date End Date Taking? Authorizing Provider  aspirin EC 81 MG tablet Take 81 mg by mouth daily.  01/12/16  Yes [provider]  digoxin (LANOXIN) 0.25 MG tablet Take 0.25 mg by mouth daily.  01/06/16 01/24/17 Yes [provider]  glimepiride (AMARYL) 4 MG tablet Take 4 mg by mouth 2 (two) times daily.  04/21/16  Yes [provider]  HYDROcodone-acetaminophen (NORCO/VICODIN) 5-325 MG tablet Take 1 tablet by mouth every 4 (four) hours as needed for moderate pain. 01/21/17  Yes Dustin Flock, MD  insulin aspart (NOVOLOG) 100 UNIT/ML FlexPen Inject 3 Units into the skin.  12/13/16 12/13/17 Yes [provider]  LANTUS SOLOSTAR 100 UNIT/ML Solostar Pen Inject 10 Units into the skin at bedtime. 12/11/16  Yes [provider]  losartan (COZAAR) 100 MG tablet take 100mg  by mouth once daily 01/13/16  Yes [provider]  metoprolol tartrate (LOPRESSOR) 25 MG tablet Take 25 mg by mouth 2 (two) times daily.  04/25/16  Yes [provider]  pravastatin (PRAVACHOL) 20 MG tablet take 20mg  by mouth daily at bedtime 02/27/16  Yes [provider]  tamsulosin (FLOMAX) 0.4 MG CAPS capsule Take 0.4 mg by mouth daily. 12/07/16  Yes [provider]  Lancets 30G MISC Use 1 Units as directed. Check CBG's up to twice daily. Dx: E11.9 04/15/15   [provider]     Family History  Problem Relation Age of Onset  . Diabetes Mother   . Stroke Mother   . Prostate cancer Brother   . Cancer Sister        Breast cancer  . Kidney cancer Neg Hx   . Bladder Cancer Neg Hx     Social History   Social History  . Marital status: Divorced    Spouse name: N/A  . Number of  children: N/A  . Years of education: N/A   Social History Main Topics  . Smoking status: Never Smoker  . Smokeless tobacco: Never Used  . Alcohol use No  . Drug use: No  . Sexual activity: Not Asked   Other Topics Concern  . None   Social History Narrative  . None     Review of Systems  Constitutional: Negative.   Respiratory: Negative.   Cardiovascular: Negative.     Vital Signs: BP (!) 146/87   Pulse 88   Temp 97.9 F (36.6 C) (Oral)   Resp 18   Ht 5\' 11"  (1.803 m)   Wt 257 lb (116.6 kg)   SpO2 98%   BMI 35.84 kg/m   Physical Exam  HENT:  Mouth/Throat: Oropharynx is clear and moist.  Cardiovascular: Normal rate, regular rhythm and normal heart sounds.   Pulmonary/Chest: Effort normal and breath sounds normal.  Abdominal: Soft. He exhibits no distension. There is no tenderness.    Imaging: Nm Pet Image Restag (ps) Skull Base To Thigh  Result Date: 01/22/2017 CLINICAL DATA:  Subsequent treatment strategy for followup of rectal cancer. EXAM: NUCLEAR MEDICINE PET SKULL BASE TO THIGH TECHNIQUE: 13.0 mCi F-18 FDG was injected intravenously. Full-ring PET imaging was performed from the skull base to thigh after the radiotracer. CT data was obtained and used for attenuation correction and anatomic localization. FASTING BLOOD GLUCOSE:  Value: 145 mg/dl COMPARISON:  CT 01/11/2017. FINDINGS: NECK: Low left jugular and supraclavicular hypermetabolic nodes. An index left supraclavicular nodal mass measures 1.7 cm and a S.U.V. max of 10.0 on image 69/series 3. Right-sided carotid atherosclerosis. Right maxillary mucous retention cyst or polyp. CHEST: High left mediastinal node measures 12 mm and a S.U.V. max of 4.6 on image 83/series 3. A right infrahilar node measures a S.U.V. max of 8.0 on image 100/ series 3. Not well visualized on CT. Right retrocrural node measures 8 mm and a S.U.V. max of 6.9 on image 146/series 3. Mild cardiomegaly with multivessel coronary artery  atherosclerosis. Pulmonary artery enlargement, 3.7 cm outflow tract. Mild centrilobular emphysema. ABDOMEN/PELVIS: Extensive hypermetabolic retroperitoneal adenopathy. Example left periaortic nodes measure up to 1.5 cm and a S.U.V. max of 16.5 on image 173/series 3. Bilateral pelvic sidewall adenopathy, with a right external iliac node measuring 1.5 cm and a S.U.V. max of 23.2. Bilateral hypermetabolic inguinal nodes, with a index left inguinal node measuring 1.6 cm and a S.U.V. max of 6.2 on image 282/series 3. Heterogeneous soft tissue density about the pelvic cul-de-sac corresponds to hypermetabolism. This measures a S.U.V. max of 13.7, including image 20 of 51/series 3. This presumably represents a combination of prostate and surrounding recurrent tumor. Left adrenal hypermetabolism which is felt to correspond to minimal nodularity. This measures 11 mm and a S.U.V. max of 5.8 on image 155/series 3. Moderate hepatic steatosis. Placement of bilateral nephrostomy catheters. Small volume perinephric hematoma posteriorly on image 173/ series 3. Improved appearance of right greater than left hydronephrosis. Descending colostomy. Air within left renal collecting system and  urinary bladder. Thick walled bladder is similar. SKELETON: Degenerative partial fusion of the bilateral sacroiliac joints. IMPRESSION: 1. Hypermetabolism corresponding to heterogeneous soft tissue density in the pelvic cul-de-sac. This likely represents local tumor recurrence, superimposed upon the prostate. 2. Nodal metastasis within the neck, chest, abdomen, and pelvis. 3. Placement of nephrostomy catheters, with improvement in bilateral hydronephrosis. Small volume right perinephric hematoma. 4. Minimal left adrenal nodularity and hypermetabolism, for which metastatic disease cannot be excluded. 5. Coronary artery atherosclerosis. Aortic Atherosclerosis (ICD10-I70.0). 6. Pulmonary artery enlargement suggests pulmonary arterial hypertension. 7.  Hepatic steatosis. 8. Air in the left renal collecting system is likely due to recent nephrostomy. Air within the bladder could be secondary. Similar thick walled bladder could be radiation induced. Infectious cystitis cannot be excluded. Electronically Signed   By: [name] M.D.   On: 01/22/2017 14:30   Ct Hematuria Workup  Result Date: 01/11/2017 CLINICAL DATA:  Followup colon cancer. EXAM: CT ABDOMEN AND PELVIS WITHOUT AND WITH CONTRAST TECHNIQUE: Multidetector CT imaging of the abdomen and pelvis was performed following the standard protocol before and following the bolus administration of intravenous contrast. CONTRAST:  120mL ISOVUE-300 IOPAMIDOL (ISOVUE-300) INJECTION 61% COMPARISON:  CT scan 04/27/2016 and 08/24/2014 FINDINGS: Lower chest: The lung bases are clear of acute process. No pleural effusion or pulmonary lesions. The heart is normal in size. No pericardial effusion. Stable coronary artery calcifications. The distal esophagus and aorta are unremarkable. Hepatobiliary: No focal hepatic lesions or intrahepatic biliary dilatation the gallbladder appears normal. No common bile duct dilatation. Pancreas: No mass, inflammation or ductal dilatation. Spleen: Normal size.  No focal lesions. Adrenals/Urinary Tract: The adrenal glands are normal in stable. Bilateral hydroureteronephrosis down to enlarging pelvic mass. The right-sided hydroureteronephrosis is new and the left side is progressive. The bladder contains a Foley catheter. There is diffuse asymmetric bladder wall thickening which appears progressive. The delayed images demonstrate compression but not complete obstruction of the right ureter. Stomach/Bowel: The stomach, duodenum, small bowel and colon are grossly normal without oral contrast. No acute inflammatory changes or obstructive findings. Stable left lower quadrant colostomy. Vascular/Lymphatic: Stable atherosclerotic calcifications involving the aorta and iliac arteries. No aneurysm.  New retroperitoneal lymphadenopathy. 15 mm periaortic lymph node on image number 38. 14 mm right-sided retroperitoneal lymph node posterior to the IVC on image number 41. Left para-aortic lymph node on image number 50 measures 13.5 mm. A left common iliac vein stent is noted. Reproductive: The prostate gland is grossly normal. The seminal vesicles are difficult to identified. Other: Enlarging presacral soft tissue mass which appears confluent with the base of the bladder. Central fluid collection appears stable. The presacral tumor is extending higher up into the pelvis where it is likely obstructing both ureters. Bilateral inguinal lymphadenopathy is new. Index node on the right side on image number 94 measures 14.5 mm an index node on the left side on image number 102 measures 17 mm. Musculoskeletal: No definite metastatic osseous disease. IMPRESSION: 1. 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 New right-sided hydroureteronephrosis with compression of the distal ureter but no complete obstruction. The left ureter appears completely obstructed. 2. New retroperitoneal lymphadenopathy. 3. No findings for bowel obstruction. Left lower quadrant colostomy is stable. 4. No definite CT findings for hepatic metastatic disease. 5. New bilateral inguinal lymphadenopathy. Electronically Signed   By: Marijo Sanes M.D.   On: 01/11/2017 13:18   Ir Nephrostomy Placement Left  Result Date: 01/19/2017  EXAM: IR NEPHROSTOMY PLACEMENT LEFT; IR NEPHROSTOMY PLACEMENT RIGHT COMPARISON:  None. MEDICATIONS: 2 g IV Ancef; The antibiotic was administered in an appropriate time frame prior to skin puncture. ANESTHESIA/SEDATION: Fentanyl 100 mcg IV; Versed 3.0 mg IV Moderate Sedation Time:  60 minutes. The patient was continuously monitored during the procedure by the interventional radiology nurse under my direct supervision. CONTRAST:  10 mL Isovue-300 -  administered into the collecting system(s) FLUOROSCOPY TIME:  Fluoroscopy Time: 1 minute.  (52 mGy). COMPLICATIONS: None immediate. PROCEDURE: Informed written consent was obtained from the patient after a thorough discussion of the procedural risks, benefits and alternatives. All questions were addressed. Maximal Sterile Barrier Technique was utilized including caps, mask, sterile gowns, sterile gloves, sterile drape, hand hygiene and skin antiseptic. A timeout was performed prior to the initiation of the procedure. Both kidneys were localized ultrasound. Under ultrasound guidance, access of the collecting systems was performed with 21 gauge needles. Transitional dilator is replaced. Tracts were dilated over guidewires and 10 French nephrostomy tubes were advanced bilaterally. Both tubes were injected with contrast to confirm position and fluoroscopic images saved. The catheters were connected to gravity bags and secured at the skin with Prolene retention sutures and StatLock devices. FINDINGS: There is significant hydronephrosis on the left. After lower pole access, a nephrostomy tube was advanced into the central collecting system and is draining well after placement. Right-sided hydronephrosis is present, to a lesser degree than on the left. Puncture of the right kidney was more difficult by ultrasound guidance, resulting in a more central puncture of the collecting system. A nephrostomy tube was advanced into the collecting system such that it is partially formed and extends just into the proximal ureter. The tube is draining well after placement. IMPRESSION: Placement of bilateral percutaneous nephrostomy tubes. Bilateral 10 French nephrostomy tubes were placed and attached to gravity bag drainage. Electronically Signed   By: Aletta Edouard M.D.   On: 01/19/2017 15:11   Ir Nephrostomy Placement Right  Result Date: 01/19/2017 EXAM: IR NEPHROSTOMY PLACEMENT LEFT; IR NEPHROSTOMY PLACEMENT RIGHT COMPARISON:   None. MEDICATIONS: 2 g IV Ancef; The antibiotic was administered in an appropriate time frame prior to skin puncture. ANESTHESIA/SEDATION: Fentanyl 100 mcg IV; Versed 3.0 mg IV Moderate Sedation Time:  60 minutes. The patient was continuously monitored during the procedure by the interventional radiology nurse under my direct supervision. CONTRAST:  10 mL Isovue-300 - administered into the collecting system(s) FLUOROSCOPY TIME:  Fluoroscopy Time: 1 minute.  (52 mGy). COMPLICATIONS: None immediate. PROCEDURE: Informed written consent was obtained from the patient after a thorough discussion of the procedural risks, benefits and alternatives. All questions were addressed. Maximal Sterile Barrier Technique was utilized including caps, mask, sterile gowns, sterile gloves, sterile drape, hand hygiene and skin antiseptic. A timeout was performed prior to the initiation of the procedure. Both kidneys were localized ultrasound. Under ultrasound guidance, access of the collecting systems was performed with 21 gauge needles. Transitional dilator is replaced. Tracts were dilated over guidewires and 10 French nephrostomy tubes were advanced bilaterally. Both tubes were injected with contrast to confirm position and fluoroscopic images saved. The catheters were connected to gravity bags and secured at the skin with Prolene retention sutures and StatLock devices. FINDINGS: There is significant hydronephrosis on the left. After lower pole access, a nephrostomy tube was advanced into the central collecting system and is draining well after placement. Right-sided hydronephrosis is present, to a lesser degree than on the left. Puncture of the right kidney was  more difficult by ultrasound guidance, resulting in a more central puncture of the collecting system. A nephrostomy tube was advanced into the collecting system such that it is partially formed and extends just into the proximal ureter. The tube is draining well after placement.  IMPRESSION: Placement of bilateral percutaneous nephrostomy tubes. Bilateral 10 French nephrostomy tubes were placed and attached to gravity bag drainage. Electronically Signed   By: Aletta Edouard M.D.   On: 01/19/2017 15:11    Labs:  CBC:  Recent Labs  05/09/16 0136 01/16/17 1615 01/18/17 1542 01/18/17 1841 01/19/17 0404 01/24/17 0926  WBC 8.4 13.9*  --   --  9.3 13.3*  HGB 12.5* 13.2 12.3* 12.0* 11.5* 13.0  HCT 36.3* 37.9* 35.5* 33.8* 32.6* 38.0*  PLT 181 280  --   --  213 265    COAGS:  Recent Labs  05/08/16 1854 01/19/17 0404  INR 1.11 1.19  APTT 37*  --     BMP:  Recent Labs  01/16/17 1615 01/19/17 0404 01/20/17 0446 01/21/17 0812  NA 138 143 141 141  K 3.9 4.1 3.9 4.1  CL 104 113* 110 110  CO2 22 23 24 24   GLUCOSE 152* 115* 154* 143*  BUN 37* 30* 25* 25*  CALCIUM 9.3 8.2* 8.2* 8.3*  CREATININE 2.46* 2.35* 2.09* 2.05*  GFRNONAA 26* 27* 32* 32*  GFRAA 30* 32* 37* 37*    LIVER FUNCTION TESTS:  Recent Labs  04/19/16 1407 01/16/17 1615 01/19/17 0404  BILITOT 0.8 0.4 0.6  AST 32 28 20  ALT 24 26 15*  ALKPHOS 46 68 49  PROT 7.3 7.4 6.0*  ALBUMIN 3.7 3.5 2.8*    TUMOR MARKERS: No results for input(s): AFPTM, CEA, CA199, CHROMGRNA in the last 8760 hours.  Assessment and Plan:  66 year old with history of rectal cancer and diffuse metastatic disease based on recent imaging. Patient has multiple enlarged lymph nodes and needs a tissue diagnosis. Plan for ultrasound-guided core biopsy of a inguinal lymph node and placement of a Port-A-Cath. Procedures explained to the patient and informed consent was obtained. Plan to use moderate sedation for these procedures.  Thank you for this interesting consult.  I greatly enjoyed meeting Donald Townsend and look forward to participating in their care.  A copy of this report was sent to the requesting provider on this date.  Electronically Signed: Carylon Perches, MD 01/24/2017, 10:11 AM   I spent a  total of    15 Minutes in face to face in clinical consultation, greater than 50% of which was counseling/coordinating care for port placement and lymph node biopsy.

## 2017-01-24 NOTE — Procedures (Signed)
US guided core biopsies of right inguinal lymph node.  6 cores obtained.  Minimal blood loss and no immediate complication.

## 2017-01-24 NOTE — Telephone Encounter (Signed)
Donald Townsend, please schedule as directed and contact the patient..  I entered the lab orders.

## 2017-01-28 ENCOUNTER — Inpatient Hospital Stay (HOSPITAL_BASED_OUTPATIENT_CLINIC_OR_DEPARTMENT_OTHER): Payer: Medicare Other | Admitting: Internal Medicine

## 2017-01-28 ENCOUNTER — Inpatient Hospital Stay: Payer: Medicare Other | Attending: Internal Medicine

## 2017-01-28 ENCOUNTER — Telehealth: Payer: Self-pay

## 2017-01-28 ENCOUNTER — Other Ambulatory Visit: Payer: Self-pay | Admitting: Pathology

## 2017-01-28 VITALS — BP 118/72 | HR 96 | Temp 97.3°F | Resp 18 | Wt 253.8 lb

## 2017-01-28 DIAGNOSIS — R1900 Intra-abdominal and pelvic swelling, mass and lump, unspecified site: Secondary | ICD-10-CM | POA: Diagnosis not present

## 2017-01-28 DIAGNOSIS — I700 Atherosclerosis of aorta: Secondary | ICD-10-CM | POA: Insufficient documentation

## 2017-01-28 DIAGNOSIS — Z803 Family history of malignant neoplasm of breast: Secondary | ICD-10-CM | POA: Insufficient documentation

## 2017-01-28 DIAGNOSIS — R0602 Shortness of breath: Secondary | ICD-10-CM | POA: Insufficient documentation

## 2017-01-28 DIAGNOSIS — I129 Hypertensive chronic kidney disease with stage 1 through stage 4 chronic kidney disease, or unspecified chronic kidney disease: Secondary | ICD-10-CM | POA: Diagnosis not present

## 2017-01-28 DIAGNOSIS — Z7901 Long term (current) use of anticoagulants: Secondary | ICD-10-CM | POA: Insufficient documentation

## 2017-01-28 DIAGNOSIS — I739 Peripheral vascular disease, unspecified: Secondary | ICD-10-CM | POA: Insufficient documentation

## 2017-01-28 DIAGNOSIS — J449 Chronic obstructive pulmonary disease, unspecified: Secondary | ICD-10-CM | POA: Diagnosis not present

## 2017-01-28 DIAGNOSIS — I2510 Atherosclerotic heart disease of native coronary artery without angina pectoris: Secondary | ICD-10-CM

## 2017-01-28 DIAGNOSIS — Z8042 Family history of malignant neoplasm of prostate: Secondary | ICD-10-CM

## 2017-01-28 DIAGNOSIS — I4891 Unspecified atrial fibrillation: Secondary | ICD-10-CM | POA: Insufficient documentation

## 2017-01-28 DIAGNOSIS — Z7189 Other specified counseling: Secondary | ICD-10-CM

## 2017-01-28 DIAGNOSIS — N189 Chronic kidney disease, unspecified: Secondary | ICD-10-CM | POA: Diagnosis not present

## 2017-01-28 DIAGNOSIS — K760 Fatty (change of) liver, not elsewhere classified: Secondary | ICD-10-CM | POA: Insufficient documentation

## 2017-01-28 DIAGNOSIS — Z79899 Other long term (current) drug therapy: Secondary | ICD-10-CM | POA: Diagnosis not present

## 2017-01-28 DIAGNOSIS — R590 Localized enlarged lymph nodes: Secondary | ICD-10-CM

## 2017-01-28 DIAGNOSIS — I6529 Occlusion and stenosis of unspecified carotid artery: Secondary | ICD-10-CM | POA: Insufficient documentation

## 2017-01-28 DIAGNOSIS — I82422 Acute embolism and thrombosis of left iliac vein: Secondary | ICD-10-CM | POA: Diagnosis not present

## 2017-01-28 DIAGNOSIS — E1151 Type 2 diabetes mellitus with diabetic peripheral angiopathy without gangrene: Secondary | ICD-10-CM | POA: Diagnosis not present

## 2017-01-28 DIAGNOSIS — N289 Disorder of kidney and ureter, unspecified: Secondary | ICD-10-CM | POA: Diagnosis not present

## 2017-01-28 DIAGNOSIS — R319 Hematuria, unspecified: Secondary | ICD-10-CM | POA: Insufficient documentation

## 2017-01-28 DIAGNOSIS — Z8041 Family history of malignant neoplasm of ovary: Secondary | ICD-10-CM | POA: Insufficient documentation

## 2017-01-28 DIAGNOSIS — Z5111 Encounter for antineoplastic chemotherapy: Secondary | ICD-10-CM | POA: Insufficient documentation

## 2017-01-28 DIAGNOSIS — G629 Polyneuropathy, unspecified: Secondary | ICD-10-CM | POA: Diagnosis not present

## 2017-01-28 DIAGNOSIS — I509 Heart failure, unspecified: Secondary | ICD-10-CM

## 2017-01-28 DIAGNOSIS — I871 Compression of vein: Secondary | ICD-10-CM | POA: Diagnosis not present

## 2017-01-28 DIAGNOSIS — Z7982 Long term (current) use of aspirin: Secondary | ICD-10-CM | POA: Insufficient documentation

## 2017-01-28 DIAGNOSIS — M545 Low back pain: Secondary | ICD-10-CM | POA: Insufficient documentation

## 2017-01-28 DIAGNOSIS — I481 Persistent atrial fibrillation: Secondary | ICD-10-CM | POA: Diagnosis not present

## 2017-01-28 DIAGNOSIS — C20 Malignant neoplasm of rectum: Secondary | ICD-10-CM | POA: Diagnosis not present

## 2017-01-28 DIAGNOSIS — Z794 Long term (current) use of insulin: Secondary | ICD-10-CM

## 2017-01-28 DIAGNOSIS — N136 Pyonephrosis: Secondary | ICD-10-CM

## 2017-01-28 DIAGNOSIS — Z86718 Personal history of other venous thrombosis and embolism: Secondary | ICD-10-CM | POA: Diagnosis not present

## 2017-01-28 DIAGNOSIS — I10 Essential (primary) hypertension: Secondary | ICD-10-CM | POA: Diagnosis not present

## 2017-01-28 DIAGNOSIS — M7989 Other specified soft tissue disorders: Secondary | ICD-10-CM | POA: Insufficient documentation

## 2017-01-28 DIAGNOSIS — E7800 Pure hypercholesterolemia, unspecified: Secondary | ICD-10-CM | POA: Diagnosis not present

## 2017-01-28 LAB — COMPREHENSIVE METABOLIC PANEL
ALBUMIN: 3.3 g/dL — AB (ref 3.5–5.0)
ALT: 26 U/L (ref 17–63)
AST: 25 U/L (ref 15–41)
Alkaline Phosphatase: 80 U/L (ref 38–126)
Anion gap: 10 (ref 5–15)
BUN: 21 mg/dL — AB (ref 6–20)
CHLORIDE: 104 mmol/L (ref 101–111)
CO2: 22 mmol/L (ref 22–32)
Calcium: 8.8 mg/dL — ABNORMAL LOW (ref 8.9–10.3)
Creatinine, Ser: 1.76 mg/dL — ABNORMAL HIGH (ref 0.61–1.24)
GFR calc Af Amer: 45 mL/min — ABNORMAL LOW (ref >60)
GFR calc non Af Amer: 39 mL/min — ABNORMAL LOW (ref >60)
GLUCOSE: 181 mg/dL — AB (ref 65–99)
POTASSIUM: 4.5 mmol/L (ref 3.5–5.1)
SODIUM: 136 mmol/L (ref 135–145)
Total Bilirubin: 0.8 mg/dL (ref 0.3–1.2)
Total Protein: 7.1 g/dL (ref 6.5–8.1)

## 2017-01-28 LAB — CBC WITH DIFFERENTIAL/PLATELET
BASOS ABS: 0.1 10*3/uL (ref 0–0.1)
BASOS PCT: 1 %
Eosinophils Absolute: 1.1 10*3/uL — ABNORMAL HIGH (ref 0–0.7)
Eosinophils Relative: 8 %
HEMATOCRIT: 35.7 % — AB (ref 40.0–52.0)
Hemoglobin: 12.2 g/dL — ABNORMAL LOW (ref 13.0–18.0)
Lymphocytes Relative: 9 %
Lymphs Abs: 1.2 10*3/uL (ref 1.0–3.6)
MCH: 29.4 pg (ref 26.0–34.0)
MCHC: 34.2 g/dL (ref 32.0–36.0)
MCV: 86.1 fL (ref 80.0–100.0)
MONO ABS: 0.9 10*3/uL (ref 0.2–1.0)
Monocytes Relative: 7 %
NEUTROS ABS: 10.2 10*3/uL — AB (ref 1.4–6.5)
NEUTROS PCT: 75 %
PLATELETS: 244 10*3/uL (ref 150–440)
RBC: 4.14 MIL/uL — ABNORMAL LOW (ref 4.40–5.90)
RDW: 13.4 % (ref 11.5–14.5)
WBC: 13.5 10*3/uL — ABNORMAL HIGH (ref 3.8–10.6)

## 2017-01-28 LAB — SURGICAL PATHOLOGY

## 2017-01-28 MED ORDER — PROCHLORPERAZINE MALEATE 10 MG PO TABS: 10.0000 mg | ORAL_TABLET | Freq: Four times a day (QID) | ORAL | 1 refills | Status: DC | PRN

## 2017-01-28 MED ORDER — HYDROCODONE-ACETAMINOPHEN 5-325 MG PO TABS: 1.0000 | ORAL_TABLET | Freq: Four times a day (QID) | ORAL | 0 refills | Status: DC | PRN

## 2017-01-28 MED ORDER — LIDOCAINE-PRILOCAINE 2.5-2.5 % EX CREA: 1.0000 "application " | TOPICAL_CREAM | CUTANEOUS | 0 refills | Status: DC | PRN

## 2017-01-28 MED ORDER — ONDANSETRON HCL 8 MG PO TABS: ORAL_TABLET | ORAL | 2 refills | Status: DC

## 2017-01-28 NOTE — Progress Notes (Signed)
Donald Townsend  Patient Care Team: Dion Body, MD as PCP - General (Family Medicine) Clent Jacks, RN as Registered Nurse  CHIEF COMPLAINTS/PURPOSE OF CONSULTATION: RECTAL CANCER  #  Oncology History   # 2012- RECTO-SIGMOID STAGE III CA [s/p neo-adj chemo-RT]; APR [Dr.Smith/Dr.Pandit]  S/p FOLFOX   # SEP 2019- Pre-sacral fluid/mass- increasing ~ 5-6 cm [increased from dec 2017; also NEW retroperitoneal lymph nodes; inguinal adenopathy]; SEP 2018- left supraclavicular; mediastinal; right hilar-retroperitoneal; pelvic mass/node; bilateral inguinal adenopathy; s/p right Ingiunal LNBx - RECURRENT CA [necrotic;insuff tissue for F-One]  # OCT 8th 2018-FOLFIRI  # LEFT LE DVT [? [facility]- Dr.Schneir]; Eliquis.   # CHF/COPD/CKD/ PN sec to [place]     Rectal cancer ([place])    HISTORY OF PRESENTING ILLNESS:  Donald Townsend 66 y.o.  male  above history of rectal cancer 2012 stage III status post resection- With concerns for recurrence is here for follow-up. Review the results of the lymph node biopsy ; PET scan results.  In the interim patient was admitted to the hospital given the inability for stent placement. He underwent percutaneous nephrostomy tubes bilaterally. Creatinine improved from 2.8; to currently 1.7.  He has not started back on Eliquis yet. He continues to complain of 5 on a scale of 10 low back pain and pelvic pain. Denies any fevers or chills.  Patient denies any pain. He has chronic mild swelling of the left lower extremity. Chronic tingling and numbness of his bilateral lower extremities. Mild chronic shortness of breath-CHF. He is on diuretics. He is currently being taken off metformin given his renal dysfunction.  ROS: A complete 10 point review of system is done which is negative except mentioned above in history of present illness  MEDICAL HISTORY:  Past Medical History:  Diagnosis Date  . A-fib ([place])   . Cancer [facility])     Colon  . CHF (congestive heart failure) ([place])   . Diabetes mellitus without complication ([place])   . Dyspnea   . Elevated lipids   . Hypertension   . PVD (peripheral vascular disease) ([place])     SURGICAL HISTORY: Past Surgical History:  Procedure Laterality Date  . COLON RESECTION     with colostomy  . COLONOSCOPY    . CYSTOSCOPY WITH STENT PLACEMENT Bilateral 01/18/2017   Procedure: CYSTOSCOPY WITH STENT PLACEMENT;  Surgeon: [name], MD;  Location: [facility] ORS;  Service: Urology;  Laterality: Bilateral;  . IR FLUORO GUIDE PORT INSERTION RIGHT  01/24/2017  . IR NEPHROSTOMY PLACEMENT LEFT  01/19/2017  . IR NEPHROSTOMY PLACEMENT RIGHT  01/19/2017  . IVC FILTER REMOVAL N/A 08/28/2016   Procedure: IVC Filter Removal;  Surgeon: Katha Cabal, MD;  Location: [place] [facility];  Service: Cardiovascular;  Laterality: N/A;  . LOWER EXTREMITY VENOGRAPHY Left 12/18/2016   Procedure: Lower Extremity Venography;  Surgeon: Katha Cabal, MD;  Location: [place] [facility];  Service: Cardiovascular;  Laterality: Left;  . PERIPHERAL VASCULAR CATHETERIZATION Left 05/08/2016   Procedure: Lower Extremity Venography;  Surgeon: Katha Cabal, MD;  Location: [place] [facility];  Service: Cardiovascular;  Laterality: Left;    SOCIAL HISTORY: [place]; retdOccupational psychologist; currently works part time. He lives alone. No smoking occasional alcohol. Social History   Social History  . Marital status: Divorced    Spouse name: N/A  . Number of children: N/A  . Years of education: N/A   Occupational History  . Not on file.   Social  History Main Topics  . Smoking status: Never Smoker  . Smokeless tobacco: Never Used  . Alcohol use No  . Drug use: No  . Sexual activity: Not on file   Other Topics Concern  . Not on file   Social History Narrative  . No narrative on file    FAMILY HISTORY: Family History  Problem Relation Age of Onset  . Diabetes Mother   . Stroke Mother   .  Prostate cancer Brother   . Cancer Sister        Breast cancer  . Kidney cancer Neg Hx   . Bladder Cancer Neg Hx     ALLERGIES:  has No Known Allergies.  MEDICATIONS:  Current Outpatient Prescriptions  Medication Sig Dispense Refill  . glimepiride (AMARYL) 4 MG tablet Take 4 mg by mouth 2 (two) times daily.   0  . HYDROcodone-acetaminophen (NORCO/VICODIN) 5-325 MG tablet Take 1 tablet by mouth every 6 (six) hours as needed for moderate pain. 90 tablet 0  . insulin aspart (NOVOLOG) 100 UNIT/ML FlexPen Inject 3 Units into the skin.     . Lancets 30G MISC Use 1 Units as directed. Check CBG's up to twice daily. Dx: E11.9    . LANTUS SOLOSTAR 100 UNIT/ML Solostar Pen Inject 10 Units into the skin at bedtime.  0  . losartan (COZAAR) 100 MG tablet take 100mg  by mouth once daily    . metoprolol tartrate (LOPRESSOR) 25 MG tablet Take 25 mg by mouth 2 (two) times daily.     . pravastatin (PRAVACHOL) 20 MG tablet take 20mg  by mouth daily at bedtime    . tamsulosin (FLOMAX) 0.4 MG CAPS capsule Take 0.4 mg by mouth daily.  0  . aspirin EC 81 MG tablet Take 81 mg by mouth daily.     . digoxin (LANOXIN) 0.25 MG tablet Take 0.25 mg by mouth daily.     [name] ELIQUIS 5 MG TABS tablet Take 5 mg by mouth every 12 (twelve) hours.  0  . furosemide (LASIX) 40 MG tablet   0  . lidocaine-prilocaine (EMLA) cream Apply 1 application topically as needed. Apply generously over the Mediport 45 minutes prior to chemotherapy. 30 g 0  . ondansetron (ZOFRAN) 8 MG tablet 1 pill every 8 hours for nausea/vomitting as needed 40 tablet 2  . prochlorperazine (COMPAZINE) 10 MG tablet Take 1 tablet (10 mg total) by mouth every 6 (six) hours as needed for nausea or vomiting. 40 tablet 1   No current facility-administered medications for this visit.       [name]  PHYSICAL EXAMINATION: ECOG PERFORMANCE STATUS: 0 - Asymptomatic  Vitals:   01/28/17 1343  BP: 118/72  Pulse: 96  Resp: 18  Temp: (!) 97.3 F (36.3 C)   Filed  Weights   01/28/17 1343  Weight: 253 lb 12.8 oz (115.1 kg)    GENERAL: Well-nourished well-developed; Alert, no distress and comfortable.   With his daughter.  EYES: no pallor or icterus OROPHARYNX: no thrush or ulceration; good dentition  NECK: supple, no masses felt LYMPH:  no palpable lymphadenopathy in the cervical, axillary or inguinal regions LUNGS: clear to auscultation and  No wheeze or crackles HEART/CVS: regular rate & rhythm and no murmurs; No lower extremity edema ABDOMEN: abdomen soft, non-tender and normal bowel sounds; colostomy in place. Bilateral nephrostomy tubes in place. Musculoskeletal:no cyanosis of digits and no clubbing  PSYCH: alert & oriented x 3 with fluent speech NEURO: no focal motor/sensory deficits SKIN:  no  rashes or significant lesions  LABORATORY DATA:  I have reviewed the data as listed Lab Results  Component Value Date   WBC 13.5 (H) 01/28/2017   HGB 12.2 (L) 01/28/2017   HCT 35.7 (L) 01/28/2017   MCV 86.1 01/28/2017   PLT 244 01/28/2017    Recent Labs  01/16/17 1615 01/19/17 0404 01/20/17 0446 01/21/17 0812 01/28/17 1317  NA 138 143 141 141 136  K 3.9 4.1 3.9 4.1 4.5  CL 104 113* 110 110 104  CO2 22 23 24 24 22   GLUCOSE 152* 115* 154* 143* 181*  BUN 37* 30* 25* 25* 21*  CREATININE 2.46* 2.35* 2.09* 2.05* 1.76*  CALCIUM 9.3 8.2* 8.2* 8.3* 8.8*  GFRNONAA 26* 27* 32* 32* 39*  GFRAA 30* 32* 37* 37* 45*  PROT 7.4 6.0*  --   --  7.1  ALBUMIN 3.5 2.8*  --   --  3.3*  AST 28 20  --   --  25  ALT 26 15*  --   --  26  ALKPHOS 68 49  --   --  80  BILITOT 0.4 0.6  --   --  0.8    RADIOGRAPHIC STUDIES: I have personally reviewed the radiological images as listed and agreed with the findings in the report. Nm Pet Image Restag (ps) Skull Base To Thigh  Result Date: 01/22/2017 CLINICAL DATA:  Subsequent treatment strategy for followup of rectal cancer. EXAM: NUCLEAR MEDICINE PET SKULL BASE TO THIGH TECHNIQUE: 13.0 mCi F-18 FDG was  injected intravenously. Full-ring PET imaging was performed from the skull base to thigh after the radiotracer. CT data was obtained and used for attenuation correction and anatomic localization. FASTING BLOOD GLUCOSE:  Value: 145 mg/dl COMPARISON:  CT 01/11/2017. FINDINGS: NECK: Low left jugular and supraclavicular hypermetabolic nodes. An index left supraclavicular nodal mass measures 1.7 cm and a S.U.V. max of 10.0 on image 69/series 3. Right-sided carotid atherosclerosis. Right maxillary mucous retention cyst or polyp. CHEST: High left mediastinal node measures 12 mm and a S.U.V. max of 4.6 on image 83/series 3. A right infrahilar node measures a S.U.V. max of 8.0 on image 100/ series 3. Not well visualized on CT. Right retrocrural node measures 8 mm and a S.U.V. max of 6.9 on image 146/series 3. Mild cardiomegaly with multivessel coronary artery atherosclerosis. Pulmonary artery enlargement, 3.7 cm outflow tract. Mild centrilobular emphysema. ABDOMEN/PELVIS: Extensive hypermetabolic retroperitoneal adenopathy. Example left periaortic nodes measure up to 1.5 cm and a S.U.V. max of 16.5 on image 173/series 3. Bilateral pelvic sidewall adenopathy, with a right external iliac node measuring 1.5 cm and a S.U.V. max of 23.2. Bilateral hypermetabolic inguinal nodes, with a index left inguinal node measuring 1.6 cm and a S.U.V. max of 6.2 on image 282/series 3. Heterogeneous soft tissue density about the pelvic cul-de-sac corresponds to hypermetabolism. This measures a S.U.V. max of 13.7, including image 20 of 51/series 3. This presumably represents a combination of prostate and surrounding recurrent tumor. Left adrenal hypermetabolism which is felt to correspond to minimal nodularity. This measures 11 mm and a S.U.V. max of 5.8 on image 155/series 3. Moderate hepatic steatosis. Placement of bilateral nephrostomy catheters. Small volume perinephric hematoma posteriorly on image 173/ series 3. Improved appearance of  right greater than left hydronephrosis. Descending colostomy. Air within left renal collecting system and urinary bladder. Thick walled bladder is similar. SKELETON: Degenerative partial fusion of the bilateral sacroiliac joints. IMPRESSION: 1. Hypermetabolism corresponding to heterogeneous soft tissue density in the pelvic  cul-de-sac. This likely represents local tumor recurrence, superimposed upon the prostate. 2. Nodal metastasis within the neck, chest, abdomen, and pelvis. 3. Placement of nephrostomy catheters, with improvement in bilateral hydronephrosis. Small volume right perinephric hematoma. 4. Minimal left adrenal nodularity and hypermetabolism, for which metastatic disease cannot be excluded. 5. Coronary artery atherosclerosis. Aortic Atherosclerosis (ICD10-I70.0). 6. Pulmonary artery enlargement suggests pulmonary arterial hypertension. 7. Hepatic steatosis. 8. Air in the left renal collecting system is likely due to recent nephrostomy. Air within the bladder could be secondary. Similar thick walled bladder could be radiation induced. Infectious cystitis cannot be excluded. Electronically Signed   By: [name] M.D.   On: 01/22/2017 14:30   [place] Core Biopsy (lymph Nodes)  Addendum Date: 01/24/2017   ADDENDUM REPORT: 01/24/2017 15:34 ADDENDUM: Correction to the sedation medicines:  Fentanyl 25 mcg, Versed 1 mg Electronically Signed   By: [name] M.D.   On: 01/24/2017 15:34   Result Date: 01/24/2017 INDICATION: 66 year old with a history of rectal cancer. Patient now has evidence for diffuse metastatic disease including multiple enlarged lymph nodes. Tissue diagnosis is needed. EXAM: ULTRASOUND GUIDED CORE BIOPSY OF RIGHT INGUINAL LYMPH NODE MEDICATIONS: None. ANESTHESIA/SEDATION: Fentanyl 50 mcg IV; Versed 1 mg IV Moderate Sedation Time:  16 minutes The patient was continuously monitored during the procedure by the interventional radiology nurse under my direct supervision. PROCEDURE: The  procedure, risks, benefits, and alternatives were explained to the patient. Questions regarding the procedure were encouraged and answered. The patient understands and consents to the procedure. Ultrasound demonstrated enlarged right inguinal lymph nodes. A slightly irregular hypoechoic lymph node was selected for biopsy. Right groin was prepped with chlorhexidine and sterile field was created. Skin was anesthetized with 1% lidocaine. Using ultrasound guidance, core biopsies were obtained within the right inguinal lymph node. Total of 6 core biopsies were obtained. Five biopsies were placed in formalin and 1 was placed on a Telfa pad. Bandage placed over the puncture site. COMPLICATIONS: None immediate. FINDINGS: Enlarged bilateral inguinal lymph nodes. Core biopsies obtained from a right inguinal lymph node. No significant bleeding or hematoma formation following the core biopsies. IMPRESSION: Successful ultrasound-guided core biopsies of a right inguinal lymph node. Electronically Signed: By: [name] M.D. On: 01/24/2017 11:24   Ct Hematuria Workup  Result Date: 01/11/2017 CLINICAL DATA:  Followup colon cancer. EXAM: CT ABDOMEN AND PELVIS WITHOUT AND WITH CONTRAST TECHNIQUE: Multidetector CT imaging of the abdomen and pelvis was performed following the standard protocol before and following the bolus administration of intravenous contrast. CONTRAST:  154mL ISOVUE-300 IOPAMIDOL (ISOVUE-300) INJECTION 61% COMPARISON:  CT scan 04/27/2016 and 08/24/2014 FINDINGS: Lower chest: The lung bases are clear of acute process. No pleural effusion or pulmonary lesions. The heart is normal in size. No pericardial effusion. Stable coronary artery calcifications. The distal esophagus and aorta are unremarkable. Hepatobiliary: No focal hepatic lesions or intrahepatic biliary dilatation the gallbladder appears normal. No common bile duct dilatation. Pancreas: No mass, inflammation or ductal dilatation. Spleen: Normal size.  No  focal lesions. Adrenals/Urinary Tract: The adrenal glands are normal in stable. Bilateral hydroureteronephrosis down to enlarging pelvic mass. The right-sided hydroureteronephrosis is new and the left side is progressive. The bladder contains a Foley catheter. There is diffuse asymmetric bladder wall thickening which appears progressive. The delayed images demonstrate compression but not complete obstruction of the right ureter. Stomach/Bowel: The stomach, duodenum, small bowel and colon are grossly normal without oral contrast. No acute inflammatory changes or obstructive findings. Stable left lower quadrant  colostomy. Vascular/Lymphatic: Stable atherosclerotic calcifications involving the aorta and iliac arteries. No aneurysm. New retroperitoneal lymphadenopathy. 15 mm periaortic lymph node on image number 38. 14 mm right-sided retroperitoneal lymph node posterior to the IVC on image number 41. Left para-aortic lymph node on image number 50 measures 13.5 mm. A left common iliac vein stent is noted. Reproductive: The prostate gland is grossly normal. The seminal vesicles are difficult to identified. Other: Enlarging presacral soft tissue mass which appears confluent with the base of the bladder. Central fluid collection appears stable. The presacral tumor is extending higher up into the pelvis where it is likely obstructing both ureters. Bilateral inguinal lymphadenopathy is new. Index node on the right side on image number 94 measures 14.5 mm an index node on the left side on image number 102 measures 17 mm. Musculoskeletal: No definite metastatic osseous disease. IMPRESSION: 1. 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 New right-sided hydroureteronephrosis with compression of the distal ureter but no complete obstruction. The left ureter appears completely obstructed. 2. New retroperitoneal lymphadenopathy. 3. No findings for bowel  obstruction. Left lower quadrant colostomy is stable. 4. No definite CT findings for hepatic metastatic disease. 5. New bilateral inguinal lymphadenopathy. Electronically Signed   By: Marijo Sanes M.D.   On: 01/11/2017 13:18   Ir Nephrostomy Placement Left  Result Date: 01/19/2017 EXAM: IR NEPHROSTOMY PLACEMENT LEFT; IR NEPHROSTOMY PLACEMENT RIGHT COMPARISON:  None. MEDICATIONS: 2 g IV Ancef; The antibiotic was administered in an appropriate time frame prior to skin puncture. ANESTHESIA/SEDATION: Fentanyl 100 mcg IV; Versed 3.0 mg IV Moderate Sedation Time:  60 minutes. The patient was continuously monitored during the procedure by the interventional radiology nurse under my direct supervision. CONTRAST:  10 mL Isovue-300 - administered into the collecting system(s) FLUOROSCOPY TIME:  Fluoroscopy Time: 1 minute.  (52 mGy). COMPLICATIONS: None immediate. PROCEDURE: Informed written consent was obtained from the patient after a thorough discussion of the procedural risks, benefits and alternatives. All questions were addressed. Maximal Sterile Barrier Technique was utilized including caps, mask, sterile gowns, sterile gloves, sterile drape, hand hygiene and skin antiseptic. A timeout was performed prior to the initiation of the procedure. Both kidneys were localized ultrasound. Under ultrasound guidance, access of the collecting systems was performed with 21 gauge needles. Transitional dilator is replaced. Tracts were dilated over guidewires and 10 French nephrostomy tubes were advanced bilaterally. Both tubes were injected with contrast to confirm position and fluoroscopic images saved. The catheters were connected to gravity bags and secured at the skin with Prolene retention sutures and StatLock devices. FINDINGS: There is significant hydronephrosis on the left. After lower pole access, a nephrostomy tube was advanced into the central collecting system and is draining well after placement. Right-sided  hydronephrosis is present, to a lesser degree than on the left. Puncture of the right kidney was more difficult by ultrasound guidance, resulting in a more central puncture of the collecting system. A nephrostomy tube was advanced into the collecting system such that it is partially formed and extends just into the proximal ureter. The tube is draining well after placement. IMPRESSION: Placement of bilateral percutaneous nephrostomy tubes. Bilateral 10 French nephrostomy tubes were placed and attached to gravity bag drainage. Electronically Signed   By: Aletta Edouard M.D.   On: 01/19/2017 15:11   Ir Nephrostomy Placement Right  Result Date: 01/19/2017 EXAM: IR NEPHROSTOMY PLACEMENT LEFT; IR NEPHROSTOMY PLACEMENT RIGHT COMPARISON:  None. MEDICATIONS: 2  g IV Ancef; The antibiotic was administered in an appropriate time frame prior to skin puncture. ANESTHESIA/SEDATION: Fentanyl 100 mcg IV; Versed 3.0 mg IV Moderate Sedation Time:  60 minutes. The patient was continuously monitored during the procedure by the interventional radiology nurse under my direct supervision. CONTRAST:  10 mL Isovue-300 - administered into the collecting system(s) FLUOROSCOPY TIME:  Fluoroscopy Time: 1 minute.  (52 mGy). COMPLICATIONS: None immediate. PROCEDURE: Informed written consent was obtained from the patient after a thorough discussion of the procedural risks, benefits and alternatives. All questions were addressed. Maximal Sterile Barrier Technique was utilized including caps, mask, sterile gowns, sterile gloves, sterile drape, hand hygiene and skin antiseptic. A timeout was performed prior to the initiation of the procedure. Both kidneys were localized ultrasound. Under ultrasound guidance, access of the collecting systems was performed with 21 gauge needles. Transitional dilator is replaced. Tracts were dilated over guidewires and 10 French nephrostomy tubes were advanced bilaterally. Both tubes were injected with contrast to  confirm position and fluoroscopic images saved. The catheters were connected to gravity bags and secured at the skin with Prolene retention sutures and StatLock devices. FINDINGS: There is significant hydronephrosis on the left. After lower pole access, a nephrostomy tube was advanced into the central collecting system and is draining well after placement. Right-sided hydronephrosis is present, to a lesser degree than on the left. Puncture of the right kidney was more difficult by ultrasound guidance, resulting in a more central puncture of the collecting system. A nephrostomy tube was advanced into the collecting system such that it is partially formed and extends just into the proximal ureter. The tube is draining well after placement. IMPRESSION: Placement of bilateral percutaneous nephrostomy tubes. Bilateral 10 French nephrostomy tubes were placed and attached to gravity bag drainage. Electronically Signed   By: Aletta Edouard M.D.   On: 01/19/2017 15:11   Ir Fluoro Guide Port Insertion Right  Result Date: 01/24/2017 INDICATION: 66 year old with history of rectal cancer and new lymphadenopathy. Port-A-Cath needed for treatment. EXAM: FLUOROSCOPIC AND ULTRASOUND GUIDED PLACEMENT OF A SUBCUTANEOUS PORT COMPARISON:  None. MEDICATIONS: Ancef 2 g; The antibiotic was administered within an appropriate time interval prior to skin puncture. ANESTHESIA/SEDATION: Versed 2.0 mg IV; Fentanyl 75 mcg IV; Moderate Sedation Time:  30 minutes The patient was continuously monitored during the procedure by the interventional radiology nurse under my direct supervision. FLUOROSCOPY TIME:  30 seconds, 12 mGy COMPLICATIONS: None immediate. PROCEDURE: The procedure, risks, benefits, and alternatives were explained to the patient. Questions regarding the procedure were encouraged and answered. The patient understands and consents to the procedure. Patient was placed supine on the interventional table. Ultrasound confirmed a  patent right internal jugular vein. The right chest and neck were cleaned with a skin antiseptic and a sterile drape was placed. Maximal barrier sterile technique was utilized including caps, mask, sterile gowns, sterile gloves, sterile drape, hand hygiene and skin antiseptic. The right neck was anesthetized with 1% lidocaine. Small incision was made in the right neck with a blade. Micropuncture set was placed in the right internal jugular vein with ultrasound guidance. The micropuncture wire was used for measurement purposes. The right chest was anesthetized with 1% lidocaine with epinephrine. #15 blade was used to make an incision and a subcutaneous port pocket was formed. Rowan was assembled. Subcutaneous tunnel was formed with a stiff tunneling device. The port catheter was brought through the subcutaneous tunnel. The port was placed in the subcutaneous pocket. The micropuncture set  was exchanged for a peel-away sheath. The catheter was placed through the peel-away sheath and the tip was positioned at superior cavoatrial junction. Catheter placement was confirmed with fluoroscopy. The port was accessed and flushed with heparinized saline. The port pocket was closed using two layers of absorbable sutures and Dermabond. The vein skin site was closed using a single layer of absorbable suture and Dermabond. Sterile dressings were applied. Patient tolerated the procedure well without an immediate complication. Ultrasound and fluoroscopic images were taken and saved for this procedure. IMPRESSION: Placement of a subcutaneous port device. Electronically Signed   By: [name] M.D.   On: 01/24/2017 15:30    ASSESSMENT & PLAN:   Rectal cancer [facility]) # RECURRENT RECTAL CANCER STAGE IV- [s/p LN Bx] PET September 2018 shows progressive presacral fluid collection; bilateral inguinal lymphadenopathy pelvic and also retroperitoneal adenopathy; left supraclavicular adenopathy; left mediastinal; right hilar  adenopathy.  # Recommend FOLFIRI+ avastin [HOLD avastin for recent kidney injury/ port placement; ? bladder involvement].   # Discussed the potential side effects including but not limited to-increasing fatigue, nausea vomiting, diarrhea, hair loss, sores in the mouth, increase risk of infection and hand-foot syndrome.   # Acute renal insufficiency- secondary to bilateral hydronephrosis from the underlying pelvic mass. S/p PNC bil. Renal function improving creatinine at 1.7. Question left PNC dysfunction. We'll check with urology.  # Discussed the palliative nature of the treatments; also the median survival being 2 years. Treatments are usually indefinite.   # Back pain- secondary to pelvic mass/PCN; recommend hydrocodone. New prescription given.   # CHF-compensated stable.  # Acute DVT Left -I recommend starting back on Eliquis.   # DM-2 ; on insulin/ amaryl; discontinued metformin. Recommend discussion with PCP regarding control of blood sugars.  # follow up in 3 weeks/labs; 1 week chemo/labs:FOLFIRI [cbc/cmp/CEA];   # 40 minutes face-to-face with the patient discussing the above plan of care; more than 50% of time spent on prognosis/ natural history; counseling and coordination.  # I reviewed the blood work- with the patient in detail; also reviewed the imaging independently [as summarized above]; and with the patient in detail.   Cc; Dr.L  All questions were answered. The patient knows to call the clinic with any problems, questions or concerns.     Cammie Sickle, MD 01/28/2017 6:09 PM

## 2017-01-28 NOTE — Progress Notes (Signed)
Here for follow up

## 2017-01-28 NOTE — Progress Notes (Signed)
  Oncology Nurse Navigator Documentation Educated on use of EMLA cream. Message sent to Dr. Pilar Jarvis regarding difference in urinary output in nephrostomy tubes. He has appt. to see Dr. Pilar Jarvis Friday. Encouraged to call with any needs. Navigator Location: [facility] (01/28/17 1400)   )Navigator Encounter Type: Follow-up Appt (01/28/17 1400)                       Treatment Phase: Pre-Tx/Tx Discussion (01/28/17 1400) Barriers/Navigation Needs: Education (01/28/17 1400)                          Time Spent with Patient: 45 (01/28/17 1400)

## 2017-01-28 NOTE — Telephone Encounter (Signed)
error 

## 2017-01-28 NOTE — Assessment & Plan Note (Addendum)
#   RECURRENT RECTAL CANCER STAGE IV- [s/p LN Bx] PET September 2018 shows progressive presacral fluid collection; bilateral inguinal lymphadenopathy pelvic and also retroperitoneal adenopathy; left supraclavicular adenopathy; left mediastinal; right hilar adenopathy.  # Recommend FOLFIRI+ avastin [HOLD avastin for recent kidney injury/ port placement; ? bladder involvement].   # Discussed the potential side effects including but not limited to-increasing fatigue, nausea vomiting, diarrhea, hair loss, sores in the mouth, increase risk of infection and hand-foot syndrome.   # Acute renal insufficiency- secondary to bilateral hydronephrosis from the underlying pelvic mass. S/p PNC bil. Renal function improving creatinine at 1.7. Question left PNC dysfunction. We'll check with urology.  # Discussed the palliative nature of the treatments; also the median survival being 2 years. Treatments are usually indefinite.   # Back pain- secondary to pelvic mass/PCN; recommend hydrocodone. New prescription given.   # CHF-compensated stable.  # Acute DVT Left -I recommend starting back on Eliquis.   # DM-2 ; on insulin/ amaryl; discontinued metformin. Recommend discussion with PCP regarding control of blood sugars.  # follow up in 3 weeks/labs; 1 week chemo/labs:FOLFIRI [cbc/cmp/CEA];   # 40 minutes face-to-face with the patient discussing the above plan of care; more than 50% of time spent on prognosis/ natural history; counseling and coordination.  # I reviewed the blood work- with the patient in detail; also reviewed the imaging independently [as summarized above]; and with the patient in detail.   Cc; Dr.L

## 2017-01-28 NOTE — Progress Notes (Signed)
START ON PATHWAY REGIMEN - Colorectal     A cycle is every 14 days:     Irinotecan      Leucovorin      5-Fluorouracil      5-Fluorouracil      Bevacizumab   **Always confirm dose/schedule in your pharmacy ordering system**    Patient Characteristics: Metastatic Colorectal, First Line, Nonsurgical Candidate, KRAS Mutation Positive/Unknown, BRAF Wild-Type/Unknown, PS = 0,1; Bevacizumab Eligible Current evidence of distant metastases<= Yes AJCC T Category: TX AJCC N Category: NX AJCC M Category: M1b AJCC 8 Stage Grouping: IVB BRAF Mutation Status: Awaiting Test Results KRAS/NRAS Mutation Status: Awaiting Test Results Line of therapy: First Line Would you be surprised if this patient died  in the next year<= I would be surprised if this patient died in the next year Performance Status: PS = 0, 1 Bevacizumab Eligibility: Eligible Intent of Therapy: Non-Curative / Palliative Intent, Discussed with Patient

## 2017-01-30 ENCOUNTER — Telehealth: Payer: Self-pay

## 2017-01-30 NOTE — Telephone Encounter (Signed)
  Oncology Nurse Navigator Documentation Notified Donald Townsend that per Dr. Pilar Jarvis: Scheduled to see Dr. Pilar Jarvis Friday, 10/5. Chemotherapy scheduled to start 10/10. Appointments reviewed with him. I would expect this as it appeared on his last CT that his left kidney had a higher grade/longer term blockage which would have lead to more permanent damage to his left kidney. This output variance is likely his new baseline.  Navigator Location: [facility] (01/30/17 1000)   )Navigator Encounter Type: Telephone (01/30/17 1000)                                                    Time Spent with Patient: 15 (01/30/17 1000)

## 2017-01-31 ENCOUNTER — Encounter: Payer: Self-pay | Admitting: Emergency Medicine

## 2017-01-31 ENCOUNTER — Ambulatory Visit: Payer: Medicare Other

## 2017-01-31 ENCOUNTER — Inpatient Hospital Stay
Admission: EM | Admit: 2017-01-31 | Discharge: 2017-02-03 | DRG: 872 | Disposition: A | Discharge status: Home / Self Care | Payer: Medicare Other | Attending: Internal Medicine | Admitting: Internal Medicine

## 2017-01-31 ENCOUNTER — Emergency Department: Payer: Medicare Other

## 2017-01-31 ENCOUNTER — Other Ambulatory Visit: Payer: Self-pay | Admitting: Urology

## 2017-01-31 DIAGNOSIS — C20 Malignant neoplasm of rectum: Secondary | ICD-10-CM | POA: Diagnosis present

## 2017-01-31 DIAGNOSIS — A419 Sepsis, unspecified organism: Secondary | ICD-10-CM | POA: Diagnosis not present

## 2017-01-31 DIAGNOSIS — Z933 Colostomy status: Secondary | ICD-10-CM | POA: Diagnosis not present

## 2017-01-31 DIAGNOSIS — Z9049 Acquired absence of other specified parts of digestive tract: Secondary | ICD-10-CM

## 2017-01-31 DIAGNOSIS — Z7982 Long term (current) use of aspirin: Secondary | ICD-10-CM

## 2017-01-31 DIAGNOSIS — Z79899 Other long term (current) drug therapy: Secondary | ICD-10-CM

## 2017-01-31 DIAGNOSIS — E1151 Type 2 diabetes mellitus with diabetic peripheral angiopathy without gangrene: Secondary | ICD-10-CM | POA: Diagnosis present

## 2017-01-31 DIAGNOSIS — Z85048 Personal history of other malignant neoplasm of rectum, rectosigmoid junction, and anus: Secondary | ICD-10-CM

## 2017-01-31 DIAGNOSIS — N390 Urinary tract infection, site not specified: Secondary | ICD-10-CM

## 2017-01-31 DIAGNOSIS — I509 Heart failure, unspecified: Secondary | ICD-10-CM | POA: Diagnosis present

## 2017-01-31 DIAGNOSIS — N136 Pyonephrosis: Secondary | ICD-10-CM | POA: Diagnosis present

## 2017-01-31 DIAGNOSIS — Z936 Other artificial openings of urinary tract status: Secondary | ICD-10-CM

## 2017-01-31 DIAGNOSIS — I482 Chronic atrial fibrillation: Secondary | ICD-10-CM | POA: Diagnosis not present

## 2017-01-31 DIAGNOSIS — Z7901 Long term (current) use of anticoagulants: Secondary | ICD-10-CM | POA: Diagnosis not present

## 2017-01-31 DIAGNOSIS — A4159 Other Gram-negative sepsis: Secondary | ICD-10-CM | POA: Diagnosis not present

## 2017-01-31 DIAGNOSIS — R509 Fever, unspecified: Secondary | ICD-10-CM | POA: Diagnosis not present

## 2017-01-31 DIAGNOSIS — I10 Essential (primary) hypertension: Secondary | ICD-10-CM | POA: Diagnosis not present

## 2017-01-31 DIAGNOSIS — Z794 Long term (current) use of insulin: Secondary | ICD-10-CM

## 2017-01-31 DIAGNOSIS — C189 Malignant neoplasm of colon, unspecified: Secondary | ICD-10-CM | POA: Diagnosis not present

## 2017-01-31 DIAGNOSIS — E785 Hyperlipidemia, unspecified: Secondary | ICD-10-CM | POA: Diagnosis present

## 2017-01-31 DIAGNOSIS — I110 Hypertensive heart disease with heart failure: Secondary | ICD-10-CM | POA: Diagnosis present

## 2017-01-31 LAB — COMPREHENSIVE METABOLIC PANEL
ALBUMIN: 3.3 g/dL — AB (ref 3.5–5.0)
ALK PHOS: 95 U/L (ref 38–126)
ALT: 36 U/L (ref 17–63)
ANION GAP: 11 (ref 5–15)
AST: 40 U/L (ref 15–41)
BUN: 29 mg/dL — ABNORMAL HIGH (ref 6–20)
CALCIUM: 9.5 mg/dL (ref 8.9–10.3)
CO2: 22 mmol/L (ref 22–32)
CREATININE: 1.9 mg/dL — AB (ref 0.61–1.24)
Chloride: 99 mmol/L — ABNORMAL LOW (ref 101–111)
GFR calc Af Amer: 41 mL/min — ABNORMAL LOW (ref >60)
GFR calc non Af Amer: 35 mL/min — ABNORMAL LOW (ref >60)
GLUCOSE: 172 mg/dL — AB (ref 65–99)
Potassium: 4.3 mmol/L (ref 3.5–5.1)
SODIUM: 132 mmol/L — AB (ref 135–145)
Total Bilirubin: 0.7 mg/dL (ref 0.3–1.2)
Total Protein: 7.7 g/dL (ref 6.5–8.1)

## 2017-01-31 LAB — CBC WITH DIFFERENTIAL/PLATELET
Basophils Absolute: 0.1 10*3/uL (ref 0–0.1)
Basophils Relative: 1 %
EOS PCT: 3 %
Eosinophils Absolute: 0.4 10*3/uL (ref 0–0.7)
HCT: 34.9 % — ABNORMAL LOW (ref 40.0–52.0)
HEMOGLOBIN: 11.9 g/dL — AB (ref 13.0–18.0)
LYMPHS ABS: 0.7 10*3/uL — AB (ref 1.0–3.6)
LYMPHS PCT: 6 %
MCH: 28.9 pg (ref 26.0–34.0)
MCHC: 34 g/dL (ref 32.0–36.0)
MCV: 84.9 fL (ref 80.0–100.0)
Monocytes Absolute: 1.1 10*3/uL — ABNORMAL HIGH (ref 0.2–1.0)
Monocytes Relative: 9 %
Neutro Abs: 10.3 10*3/uL — ABNORMAL HIGH (ref 1.4–6.5)
Neutrophils Relative %: 81 %
PLATELETS: 253 10*3/uL (ref 150–440)
RBC: 4.12 MIL/uL — AB (ref 4.40–5.90)
RDW: 13.6 % (ref 11.5–14.5)
WBC: 12.5 10*3/uL — AB (ref 3.8–10.6)

## 2017-01-31 LAB — URINALYSIS, COMPLETE (UACMP) WITH MICROSCOPIC
BACTERIA UA: NONE SEEN
BILIRUBIN URINE: NEGATIVE
Bilirubin Urine: NEGATIVE
GLUCOSE, UA: NEGATIVE mg/dL
Glucose, UA: NEGATIVE mg/dL
KETONES UR: NEGATIVE mg/dL
Ketones, ur: NEGATIVE mg/dL
NITRITE: POSITIVE — AB
Nitrite: POSITIVE — AB
PROTEIN: 100 mg/dL — AB
Protein, ur: 30 mg/dL — AB
SPECIFIC GRAVITY, URINE: 1.01 (ref 1.005–1.030)
Specific Gravity, Urine: 1.005 (ref 1.005–1.030)
Squamous Epithelial / LPF: NONE SEEN
Squamous Epithelial / LPF: NONE SEEN
pH: 5 (ref 5.0–8.0)
pH: 7 (ref 5.0–8.0)

## 2017-01-31 LAB — PROTIME-INR
INR: 1.31
Prothrombin Time: 16.2 seconds — ABNORMAL HIGH (ref 11.4–15.2)

## 2017-01-31 LAB — GLUCOSE, CAPILLARY: Glucose-Capillary: 117 mg/dL — ABNORMAL HIGH (ref 65–99)

## 2017-01-31 LAB — DIGOXIN LEVEL: Digoxin Level: 1.2 ng/mL (ref 0.8–2.0)

## 2017-01-31 LAB — LACTIC ACID, PLASMA
Lactic Acid, Venous: 1.6 mmol/L (ref 0.5–1.9)
Lactic Acid, Venous: 2.1 mmol/L (ref 0.5–1.9)

## 2017-01-31 MED ORDER — SODIUM CHLORIDE 0.9 % IV SOLN
INTRAVENOUS | Status: AC
  Administered 2017-01-31: 21:00:00 via INTRAVENOUS

## 2017-01-31 MED ORDER — DOCUSATE SODIUM 100 MG PO CAPS: 100.0000 mg | ORAL_CAPSULE | Freq: Two times a day (BID) | ORAL | Status: DC | PRN

## 2017-01-31 MED ORDER — FUROSEMIDE 40 MG PO TABS
40.0000 mg | ORAL_TABLET | Freq: Every day | ORAL | Status: DC
  Administered 2017-02-01 – 2017-02-03 (×3): 40 mg via ORAL
  Filled 2017-01-31 (×3): qty 1

## 2017-01-31 MED ORDER — SODIUM CHLORIDE 0.9 % IV SOLN
Freq: Once | INTRAVENOUS | Status: AC
  Administered 2017-01-31: 16:00:00 via INTRAVENOUS

## 2017-01-31 MED ORDER — PRAVASTATIN SODIUM 20 MG PO TABS
20.0000 mg | ORAL_TABLET | Freq: Every day | ORAL | Status: DC
  Administered 2017-01-31 – 2017-02-03 (×4): 20 mg via ORAL
  Filled 2017-01-31 (×4): qty 1

## 2017-01-31 MED ORDER — LOSARTAN POTASSIUM 50 MG PO TABS
50.0000 mg | ORAL_TABLET | Freq: Every day | ORAL | Status: DC
  Administered 2017-02-01 – 2017-02-03 (×3): 50 mg via ORAL
  Filled 2017-01-31 (×3): qty 1

## 2017-01-31 MED ORDER — DEXTROSE 5 % IV SOLN: 2.0000 g | Freq: Two times a day (BID) | INTRAVENOUS | Status: DC

## 2017-01-31 MED ORDER — ACETAMINOPHEN 325 MG PO TABS
650.0000 mg | ORAL_TABLET | Freq: Four times a day (QID) | ORAL | Status: DC | PRN
  Administered 2017-01-31 – 2017-02-01 (×2): 650 mg via ORAL
  Filled 2017-01-31: qty 2

## 2017-01-31 MED ORDER — INSULIN GLARGINE 100 UNIT/ML ~~LOC~~ SOLN
10.0000 [IU] | Freq: Every day | SUBCUTANEOUS | Status: DC
  Administered 2017-01-31 – 2017-02-02 (×3): 10 [IU] via SUBCUTANEOUS
  Filled 2017-01-31 (×4): qty 0.1

## 2017-01-31 MED ORDER — DEXTROSE 5 % IV SOLN
2.0000 g | Freq: Two times a day (BID) | INTRAVENOUS | Status: DC
  Administered 2017-02-01 – 2017-02-03 (×4): 2 g via INTRAVENOUS
  Filled 2017-01-31 (×5): qty 2

## 2017-01-31 MED ORDER — TAMSULOSIN HCL 0.4 MG PO CAPS
0.4000 mg | ORAL_CAPSULE | Freq: Every day | ORAL | Status: DC
  Administered 2017-02-01 – 2017-02-03 (×3): 0.4 mg via ORAL
  Filled 2017-01-31 (×3): qty 1

## 2017-01-31 MED ORDER — DIGOXIN 250 MCG PO TABS
0.2500 mg | ORAL_TABLET | Freq: Every day | ORAL | Status: DC
  Administered 2017-02-01 – 2017-02-03 (×3): 0.25 mg via ORAL
  Filled 2017-01-31 (×3): qty 1

## 2017-01-31 MED ORDER — INSULIN ASPART 100 UNIT/ML ~~LOC~~ SOLN
3.0000 [IU] | Freq: Three times a day (TID) | SUBCUTANEOUS | Status: DC
  Administered 2017-02-01 – 2017-02-03 (×7): 3 [IU] via SUBCUTANEOUS
  Filled 2017-01-31 (×6): qty 1

## 2017-01-31 MED ORDER — DEXTROSE 5 % IV SOLN
2.0000 g | Freq: Once | INTRAVENOUS | Status: AC
  Administered 2017-01-31: 2 g via INTRAVENOUS
  Filled 2017-01-31: qty 2

## 2017-01-31 MED ORDER — INSULIN ASPART 100 UNIT/ML ~~LOC~~ SOLN
0.0000 [IU] | Freq: Three times a day (TID) | SUBCUTANEOUS | Status: DC
  Administered 2017-02-01 – 2017-02-02 (×3): 1 [IU] via SUBCUTANEOUS
  Administered 2017-02-02 (×2): 2 [IU] via SUBCUTANEOUS
  Administered 2017-02-03: 1 [IU] via SUBCUTANEOUS
  Filled 2017-01-31 (×6): qty 1

## 2017-01-31 MED ORDER — SODIUM CHLORIDE 0.9 % IV SOLN: INTRAVENOUS | Status: DC

## 2017-01-31 MED ORDER — ACETAMINOPHEN 325 MG PO TABS
ORAL_TABLET | ORAL | Status: AC
  Filled 2017-01-31: qty 2

## 2017-01-31 MED ORDER — HYDROCODONE-ACETAMINOPHEN 5-325 MG PO TABS
1.0000 | ORAL_TABLET | Freq: Four times a day (QID) | ORAL | Status: DC | PRN
  Administered 2017-01-31 – 2017-02-03 (×6): 1 via ORAL
  Filled 2017-01-31 (×6): qty 1

## 2017-01-31 MED ORDER — ASPIRIN EC 81 MG PO TBEC
81.0000 mg | DELAYED_RELEASE_TABLET | Freq: Every day | ORAL | Status: DC
  Administered 2017-02-01 – 2017-02-03 (×3): 81 mg via ORAL
  Filled 2017-01-31 (×3): qty 1

## 2017-01-31 MED ORDER — APIXABAN 5 MG PO TABS
5.0000 mg | ORAL_TABLET | Freq: Two times a day (BID) | ORAL | Status: DC
  Administered 2017-01-31 – 2017-02-03 (×6): 5 mg via ORAL
  Filled 2017-01-31 (×6): qty 1

## 2017-01-31 MED ORDER — METOPROLOL TARTRATE 25 MG PO TABS
25.0000 mg | ORAL_TABLET | Freq: Two times a day (BID) | ORAL | Status: DC
  Administered 2017-01-31 – 2017-02-03 (×6): 25 mg via ORAL
  Filled 2017-01-31 (×6): qty 1

## 2017-01-31 NOTE — Progress Notes (Signed)
ANTIBIOTIC CONSULT NOTE - INITIAL  Pharmacy Consult for Cefepime  Indication: sepsis  No Known Allergies  Patient Measurements: Height: 5\' 10"  (177.8 cm) Weight: 254 lb (115.2 kg) IBW/kg (Calculated) : 73 Adjusted Body Weight:   Vital Signs: Temp: 103.2 F (39.6 C) (10/04 1503) Temp Source: Oral (10/04 1503) BP: 128/70 (10/04 1700) Pulse Rate: 92 (10/04 1700) Intake/Output from previous day: No intake/output data recorded. Intake/Output from this shift: No intake/output data recorded.  Labs:  Recent Labs  01/31/17 1509  WBC 12.5*  HGB 11.9*  PLT 253  CREATININE 1.90*   Estimated Creatinine Clearance: 49.3 mL/min (A) (by C-G formula based on SCr of 1.9 mg/dL (H)). No results for input(s): VANCOTROUGH, VANCOPEAK, VANCORANDOM, GENTTROUGH, GENTPEAK, GENTRANDOM, TOBRATROUGH, TOBRAPEAK, TOBRARND, AMIKACINPEAK, AMIKACINTROU, AMIKACIN in the last 72 hours.   Microbiology: No results found for this or any previous visit (from the past 720 hour(s)).  Medical History: Past Medical History:  Diagnosis Date  . A-fib ([place])   . Cancer [facility])    Colon  . CHF (congestive heart failure) ([place])   . Diabetes mellitus without complication ([place])   . Dyspnea   . Elevated lipids   . Hypertension   . PVD (peripheral vascular disease) ([facility])     Medications:   (Not in a hospital admission) Assessment: CrCl = 49.3 ml/min   Goal of Therapy:  resolution of infection  Plan:  Expected duration 7 days with resolution of temperature and/or normalization of WBC   Cefepime 2 gm IV X 1 ordered to be given on 10/4 @ ~ 17:00. Cefepime 2 gm IV Q12H ordered to start on 10/5 @ 0600.   Donald Townsend D 01/31/2017,5:32 PM

## 2017-01-31 NOTE — ED Triage Notes (Signed)
Patient present to ED via POV from home with c/o generalized weakness and fatigue. Patient is a cancer patient, primary cancer is colon cancer with mets to his surrounding lymph nodes. Patient had nephrotomy tubes placed on Saturday the 29th of September. Patient presents with high grade fever and tachycardia.

## 2017-01-31 NOTE — H&P (Signed)
[place] [facility] [place] NAME: Donald Townsend    MR#:  [id number]  DATE OF BIRTH:  07-Jul-1950  DATE OF ADMISSION:  01/31/2017  PRIMARY CARE PHYSICIAN: Dion Body, MD   REQUESTING/REFERRING PHYSICIAN: [place]  CHIEF COMPLAINT:   Chief Complaint  Patient presents with  . Weakness    HISTORY OF PRESENT ILLNESS: Donald Townsend  is a 65 y.o. male with a known history of A fib on Eliquis, Colon cancer- resection 5 yrs ago, Colostomy and now mass recurrence blocking b/l ureters- s/p nephrostomy tubes 2 weeks ago and have biopsy and med port placed 1 week ago. Since then have some blood coming from Penis, which is gettign dark now. Since yesterday have chills and weakness- some pain while flushing right side nephrostomy tube, went to urology clinic today, they suggested to go to ER. Noted to have fever, Elevated WBCs, UTI.   PAST MEDICAL HISTORY:   Past Medical History:  Diagnosis Date  . A-fib ([place])   . Cancer [facility])    Colon  . CHF (congestive heart failure) ([place])   . Diabetes mellitus without complication ([place])   . Dyspnea   . Elevated lipids   . Hypertension   . PVD (peripheral vascular disease) ([place])     PAST SURGICAL HISTORY: Past Surgical History:  Procedure Laterality Date  . COLON RESECTION     with colostomy  . COLONOSCOPY    . CYSTOSCOPY WITH STENT PLACEMENT Bilateral 01/18/2017   Procedure: CYSTOSCOPY WITH STENT PLACEMENT;  Surgeon: [name], MD;  Location: [facility] ORS;  Service: Urology;  Laterality: Bilateral;  . IR FLUORO GUIDE PORT INSERTION RIGHT  01/24/2017  . IR NEPHROSTOMY PLACEMENT LEFT  01/19/2017  . IR NEPHROSTOMY PLACEMENT RIGHT  01/19/2017  . IVC FILTER REMOVAL N/A 08/28/2016   Procedure: IVC Filter Removal;  Surgeon: Katha Cabal, MD;  Location: [place] [facility];  Service: Cardiovascular;  Laterality: N/A;  . LOWER EXTREMITY VENOGRAPHY Left 12/18/2016   Procedure: Lower Extremity Venography;  Surgeon:  Katha Cabal, MD;  Location: [place] [facility];  Service: Cardiovascular;  Laterality: Left;  . PERIPHERAL VASCULAR CATHETERIZATION Left 05/08/2016   Procedure: Lower Extremity Venography;  Surgeon: Katha Cabal, MD;  Location: [place] [facility];  Service: Cardiovascular;  Laterality: Left;    SOCIAL HISTORY:  Social History  Substance Use Topics  . Smoking status: Never Smoker  . Smokeless tobacco: Never Used  . Alcohol use No    FAMILY HISTORY:  Family History  Problem Relation Age of Onset  . Diabetes Mother   . Stroke Mother   . Prostate cancer Brother   . Cancer Sister        Breast cancer  . Kidney cancer Neg Hx   . Bladder Cancer Neg Hx     DRUG ALLERGIES: No Known Allergies  REVIEW OF SYSTEMS:   CONSTITUTIONAL: positive for fever, fatigue or weakness.  EYES: No blurred or double vision.  EARS, NOSE, AND THROAT: No tinnitus or ear pain.  RESPIRATORY: No cough, shortness of breath, wheezing or hemoptysis.  CARDIOVASCULAR: No chest pain, orthopnea, edema.  GASTROINTESTINAL: No nausea, vomiting, diarrhea or abdominal pain.  GENITOURINARY: No dysuria, hematuria.  ENDOCRINE: No polyuria, nocturia,  HEMATOLOGY: No anemia, easy bruising or bleeding SKIN: No rash or lesion. MUSCULOSKELETAL: No joint pain or arthritis.   NEUROLOGIC: No tingling, numbness, weakness.  PSYCHIATRY: No anxiety or depression.   MEDICATIONS AT HOME:  Prior to Admission  medications   Medication Sig Start Date End Date Taking? Authorizing Provider  aspirin EC 81 MG tablet Take 81 mg by mouth daily.  01/12/16  Yes [provider]  digoxin (LANOXIN) 0.25 MG tablet Take 0.25 mg by mouth daily.  01/06/16 01/31/18 Yes [provider]  ELIQUIS 5 MG TABS tablet Take 5 mg by mouth every 12 (twelve) hours. 01/24/17  Yes [provider]  furosemide (LASIX) 40 MG tablet Take 40 mg by mouth daily.  01/22/17  Yes [provider]  glimepiride (AMARYL) 4 MG tablet  Take 4 mg by mouth 2 (two) times daily.  04/21/16  Yes [provider]  HYDROcodone-acetaminophen (NORCO/VICODIN) 5-325 MG tablet Take 1 tablet by mouth every 6 (six) hours as needed for moderate pain. 01/28/17  Yes Cammie Sickle, MD  insulin aspart (NOVOLOG) 100 UNIT/ML FlexPen Inject 3 Units into the skin 3 (three) times daily with meals.  12/13/16 12/13/17 Yes [provider]  Lancets 30G MISC Use 1 Units as directed. Check CBG's up to twice daily. Dx: E11.9 04/15/15  Yes [provider]  LANTUS SOLOSTAR 100 UNIT/ML Solostar Pen Inject 10 Units into the skin at bedtime. 12/11/16  Yes [provider]  lidocaine-prilocaine (EMLA) cream Apply 1 application topically as needed. Apply generously over the Mediport 45 minutes prior to chemotherapy. 01/28/17  Yes Cammie Sickle, MD  losartan (COZAAR) 100 MG tablet take 100mg  by mouth once daily 01/13/16  Yes [provider]  metoprolol tartrate (LOPRESSOR) 25 MG tablet Take 25 mg by mouth 2 (two) times daily.  04/25/16  Yes [provider]  ondansetron (ZOFRAN) 8 MG tablet 1 pill every 8 hours for nausea/vomitting as needed 01/28/17  Yes Cammie Sickle, MD  pravastatin (PRAVACHOL) 20 MG tablet take 20mg  by mouth daily at bedtime 02/27/16  Yes [provider]  prochlorperazine (COMPAZINE) 10 MG tablet Take 1 tablet (10 mg total) by mouth every 6 (six) hours as needed for nausea or vomiting. 01/28/17  Yes Cammie Sickle, MD  tamsulosin (FLOMAX) 0.4 MG CAPS capsule Take 0.4 mg by mouth daily. 12/07/16  Yes [provider]      PHYSICAL EXAMINATION:   VITAL SIGNS: Blood pressure 128/70, pulse 92, temperature (!) 103.2 F (39.6 C), temperature source Oral, resp. rate 17, height 5\' 10"  (1.778 m), weight 115.2 kg (254 lb), SpO2 99 %.  GENERAL:  66 y.o.-year-old patient lying in the bed with no acute distress.  EYES: Pupils equal, round, reactive to light and  accommodation. No scleral icterus. Extraocular muscles intact.  HEENT: Head atraumatic, normocephalic. Oropharynx and nasopharynx clear.  NECK:  Supple, no jugular venous distention. No thyroid enlargement, no tenderness.  LUNGS: Normal breath sounds bilaterally, no wheezing, rales,rhonchi or crepitation. No use of accessory muscles of respiration. On right upper chest medport, under dressing- No tenderness, no redness. CARDIOVASCULAR: S1, S2 normal. No murmurs, rubs, or gallops.  ABDOMEN: Soft, nontender, nondistended. Bowel sounds present. No organomegaly or mass. B/l nephrostomy tubes. No CVangle tenderness. Colostomy in place in lower left abdomen. EXTREMITIES: No pedal edema, cyanosis, or clubbing. In right groin, bandage at the site of needle biopsy, no local tenderness, no discharge or redness at the site. NEUROLOGIC: Cranial nerves II through XII are intact. Muscle strength 5/5 in all extremities. Sensation intact. Gait not checked.  PSYCHIATRIC: The patient is alert and oriented x 3.  SKIN: No obvious rash, lesion, or ulcer.   LABORATORY PANEL:   CBC  Recent Labs  Lab 01/28/17 1321 01/31/17 1509  WBC 13.5* 12.5*  HGB 12.2* 11.9*  HCT 35.7* 34.9*  PLT 244 253  MCV 86.1 84.9  MCH 29.4 28.9  MCHC 34.2 34.0  RDW 13.4 13.6  LYMPHSABS 1.2 0.7*  MONOABS 0.9 1.1*  EOSABS 1.1* 0.4  BASOSABS 0.1 0.1   ------------------------------------------------------------------------------------------------------------------  Chemistries   Recent Labs Lab 01/28/17 1317 01/31/17 1509  NA 136 132*  K 4.5 4.3  CL 104 99*  CO2 22 22  GLUCOSE 181* 172*  BUN 21* 29*  CREATININE 1.76* 1.90*  CALCIUM 8.8* 9.5  AST 25 40  ALT 26 36  ALKPHOS 80 95  BILITOT 0.8 0.7   ------------------------------------------------------------------------------------------------------------------ estimated creatinine clearance is 49.3 mL/min (A) (by C-G formula based on SCr of 1.9 mg/dL  (H)). ------------------------------------------------------------------------------------------------------------------ No results for input(s): TSH, T4TOTAL, T3FREE, THYROIDAB in the last 72 hours.  Invalid input(s): FREET3   Coagulation profile  Recent Labs Lab 01/31/17 1509  INR 1.31   ------------------------------------------------------------------------------------------------------------------- No results for input(s): DDIMER in the last 72 hours. -------------------------------------------------------------------------------------------------------------------  Cardiac Enzymes No results for input(s): CKMB, TROPONINI, MYOGLOBIN in the last 168 hours.  Invalid input(s): CK ------------------------------------------------------------------------------------------------------------------ Invalid input(s): POCBNP  ---------------------------------------------------------------------------------------------------------------  Urinalysis    Component Value Date/Time   COLORURINE YELLOW (A) 01/31/2017 1509   COLORURINE YELLOW (A) 01/31/2017 1509   APPEARANCEUR HAZY (A) 01/31/2017 1509   APPEARANCEUR HAZY (A) 01/31/2017 1509   APPEARANCEUR Clear 01/01/2017 1640   LABSPEC 1.005 01/31/2017 1509   LABSPEC 1.010 01/31/2017 1509   LABSPEC 1.026 12/16/2012 1419   PHURINE 7.0 01/31/2017 1509   PHURINE 5.0 01/31/2017 1509   GLUCOSEU NEGATIVE 01/31/2017 1509   GLUCOSEU NEGATIVE 01/31/2017 1509   GLUCOSEU >=500 12/16/2012 1419   HGBUR MODERATE (A) 01/31/2017 1509   HGBUR MODERATE (A) 01/31/2017 1509   BILIRUBINUR NEGATIVE 01/31/2017 1509   BILIRUBINUR NEGATIVE 01/31/2017 1509   BILIRUBINUR Negative 01/01/2017 1640   BILIRUBINUR Negative 12/16/2012 1419   KETONESUR NEGATIVE 01/31/2017 1509   KETONESUR NEGATIVE 01/31/2017 1509   PROTEINUR 100 (A) 01/31/2017 1509   PROTEINUR 30 (A) 01/31/2017 1509   NITRITE POSITIVE (A) 01/31/2017 1509   NITRITE POSITIVE (A) 01/31/2017 1509    LEUKOCYTESUR LARGE (A) 01/31/2017 1509   LEUKOCYTESUR MODERATE (A) 01/31/2017 1509   LEUKOCYTESUR Negative 01/01/2017 1640   LEUKOCYTESUR Negative 12/16/2012 1419     RADIOLOGY: Dg Chest 2 View  Result Date: 01/31/2017 CLINICAL DATA:  Fever and chills, recent port placement and biopsy EXAM: CHEST  2 VIEW COMPARISON:  None. FINDINGS: Cardiac shadow is within normal limits. The lungs are well aerated bilaterally. No focal infiltrate or sizable effusion is seen. Right-sided chest wall port is noted in satisfactory position. No acute bony abnormality is noted. IMPRESSION: No active cardiopulmonary disease. Electronically Signed   By: [name] M.D.   On: 01/31/2017 16:26    EKG: Orders placed or performed in visit on 12/16/12  . EKG 12-Lead    IMPRESSION AND PLAN:  * Sepsis    UTI       S/p b/l nephrostomy tubes   IV cefepime    Ur cx   Urology consult.  * Recent diagnosis of mass in pelvis     Biopsy were done    Now with infection, may not be able to get chemo.    Advised to call cancer center for appointment re-schedule once discharge.  * DM    Cont home meds, hold oral glipizide    ISS>  * A  fib    Cont digoxin, metoprolol and Eliquis  * Htn    Cont metoprolol, lasix, decrease dose of losartan- as septic.  * Hyperlipidemia   Cont pravastatin.  All the records are reviewed and case discussed with ED provider. Management plans discussed with the patient, family and they are in agreement.  CODE STATUS: Full. Code Status History    Date Active Date Inactive Code Status Order ID Comments User Context   01/18/2017  6:40 PM 01/21/2017  5:08 PM Full Code [id number]  Nicholes Mango, MD Inpatient   05/08/2016  6:37 PM 05/09/2016  7:34 PM Full Code [id number]  Schnier, Dolores Lory, MD Inpatient    Advance Directive Documentation     Most Recent Value  Type of Advance Directive  Healthcare Power of Attorney, Living will  Pre-existing out of facility DNR order (yellow form or  pink MOST form)  -  "MOST" Form in Place?  -     Daughter and sister in room during my visit.  TOTAL TIME TAKING CARE OF THIS PATIENT: 45 minutes.    [name] M.D on 01/31/2017   Between 7am to 6pm - Pager - [phone]  After 6pm go to [email or web address] - password [email or web address] [facility]  Office  [phone]  CC: Primary care physician; Dion Body, MD   Note: This dictation was prepared with Dragon dictation along with smaller phrase technology. Any transcriptional errors that result from this process are unintentional.

## 2017-01-31 NOTE — ED Notes (Signed)
Cefepime ordered from pharmacy

## 2017-01-31 NOTE — Progress Notes (Signed)
Primary RN received notification from lab that pt Lactic Acid was 2.1. Primary RN notified Dr. Jannifer [name]. Orders received to increase IV fluids and recheck Lactic Acid. Primary RN to continue to monitor.

## 2017-01-31 NOTE — ED Provider Notes (Signed)
[facility] Emergency Department Provider Note  ____________________________________________   First MD Initiated Contact with Patient 01/31/17 1519     (approximate)  I have reviewed the triage vital signs and the nursing notes.   HISTORY  Chief Complaint Weakness   HPI Donald Townsend is a 66 y.o. male with a history of colon cancerwho is presenting to the emergency department with chills and fever over the past 12 hours. He is also had intermittent back pain and pain with flushing of his left nephrostomy tube over the past several days. Also with some blood per his urethra ever since having a cystoscopy recently. Denies any cough or shortness of breath. Denies any abdominal pain. Not on chemotherapy. Bilateral nephrostomies were placed about one month ago.   Past Medical History:  Diagnosis Date  . A-fib ([place])   . Cancer [facility])    Colon  . CHF (congestive heart failure) ([place])   . Diabetes mellitus without complication ([place])   . Dyspnea   . Elevated lipids   . Hypertension   . PVD (peripheral vascular disease) [facility])     Patient Active Problem List   Diagnosis Date Noted  . Goals of care, counseling/discussion 01/28/2017  . Hydronephrosis with ureteral stricture, not elsewhere classified   . AKI (acute kidney injury) ([place]) 01/18/2017  . Lymphadenopathy, inguinal 01/17/2017  . Rectal cancer ([place]) 01/16/2017  . Left leg pain 12/12/2016  . May-Thurner syndrome 12/12/2016  . A-fib ([place]) 06/09/2016  . Type 2 diabetes mellitus ([place]) 06/09/2016  . Hyperlipidemia 06/09/2016  . Acute deep vein thrombosis (DVT) of iliac vein of left lower extremity ([place]) 05/08/2016    Past Surgical History:  Procedure Laterality Date  . COLON RESECTION     with colostomy  . COLONOSCOPY    . CYSTOSCOPY WITH STENT PLACEMENT Bilateral 01/18/2017   Procedure: CYSTOSCOPY WITH STENT PLACEMENT;  Surgeon: [name], MD;  Location: [facility] ORS;  Service: Urology;   Laterality: Bilateral;  . IR FLUORO GUIDE PORT INSERTION RIGHT  01/24/2017  . IR NEPHROSTOMY PLACEMENT LEFT  01/19/2017  . IR NEPHROSTOMY PLACEMENT RIGHT  01/19/2017  . IVC FILTER REMOVAL N/A 08/28/2016   Procedure: IVC Filter Removal;  Surgeon: Katha Cabal, MD;  Location: [place] CV LAB;  Service: Cardiovascular;  Laterality: N/A;  . LOWER EXTREMITY VENOGRAPHY Left 12/18/2016   Procedure: Lower Extremity Venography;  Surgeon: Katha Cabal, MD;  Location: [place] CV LAB;  Service: Cardiovascular;  Laterality: Left;  . PERIPHERAL VASCULAR CATHETERIZATION Left 05/08/2016   Procedure: Lower Extremity Venography;  Surgeon: Katha Cabal, MD;  Location: [place] CV LAB;  Service: Cardiovascular;  Laterality: Left;    Prior to Admission medications   Medication Sig Start Date End Date Taking? Authorizing Provider  aspirin EC 81 MG tablet Take 81 mg by mouth daily.  01/12/16  Yes [provider]  digoxin (LANOXIN) 0.25 MG tablet Take 0.25 mg by mouth daily.  01/06/16 01/31/18 Yes [provider]  ELIQUIS 5 MG TABS tablet Take 5 mg by mouth every 12 (twelve) hours. 01/24/17  Yes [provider]  furosemide (LASIX) 40 MG tablet Take 40 mg by mouth daily.  01/22/17  Yes [provider]  glimepiride (AMARYL) 4 MG tablet Take 4 mg by mouth 2 (two) times daily.  04/21/16  Yes [provider]  HYDROcodone-acetaminophen (NORCO/VICODIN) 5-325 MG tablet Take 1 tablet by mouth every 6 (six) hours as needed for moderate pain. 01/28/17  Yes  Cammie Sickle, MD  insulin aspart (NOVOLOG) 100 UNIT/ML FlexPen Inject 3 Units into the skin 3 (three) times daily with meals.  12/13/16 12/13/17 Yes [provider]  Lancets 30G MISC Use 1 Units as directed. Check CBG's up to twice daily. Dx: E11.9 04/15/15  Yes [provider]  LANTUS SOLOSTAR 100 UNIT/ML Solostar Pen Inject 10 Units into the skin at bedtime. 12/11/16  Yes [provider]  lidocaine-prilocaine (EMLA) cream Apply 1 application topically as needed. Apply generously over the Mediport 45 minutes prior to chemotherapy. 01/28/17  Yes Cammie Sickle, MD  losartan (COZAAR) 100 MG tablet take 100mg  by mouth once daily 01/13/16  Yes [provider]  metoprolol tartrate (LOPRESSOR) 25 MG tablet Take 25 mg by mouth 2 (two) times daily.  04/25/16  Yes [provider]  ondansetron (ZOFRAN) 8 MG tablet 1 pill every 8 hours for nausea/vomitting as needed 01/28/17  Yes Cammie Sickle, MD  pravastatin (PRAVACHOL) 20 MG tablet take 20mg  by mouth daily at bedtime 02/27/16  Yes [provider]  prochlorperazine (COMPAZINE) 10 MG tablet Take 1 tablet (10 mg total) by mouth every 6 (six) hours as needed for nausea or vomiting. 01/28/17  Yes Cammie Sickle, MD  tamsulosin (FLOMAX) 0.4 MG CAPS capsule Take 0.4 mg by mouth daily. 12/07/16  Yes [provider]    Allergies Patient has no known allergies.  Family History  Problem Relation Age of Onset  . Diabetes Mother   . Stroke Mother   . Prostate cancer Brother   . Cancer Sister        Breast cancer  . Kidney cancer Neg Hx   . Bladder Cancer Neg Hx     Social History Social History  Substance Use Topics  . Smoking status: Never Smoker  . Smokeless tobacco: Never Used  . Alcohol use No    Review of Systems  Constitutional: Fever Eyes: No visual changes. ENT: No sore throat. Cardiovascular: Denies chest pain. Respiratory: Denies shortness of breath. Gastrointestinal: No abdominal pain.  No nausea, no vomiting.  No diarrhea.  No constipation. Genitourinary: Negative for dysuria. Musculoskeletal: As above Skin: Negative for rash. Neurological: Negative for headaches, focal weakness or numbness.   ____________________________________________   PHYSICAL EXAM:  VITAL SIGNS: ED Triage Vitals  Enc Vitals Group     BP 01/31/17 1503 (!) 131/57      Pulse Rate 01/31/17 1503 (!) 128     Resp 01/31/17 1503 18     Temp 01/31/17 1503 (!) 103.2 F (39.6 C)     Temp Source 01/31/17 1503 Oral     SpO2 01/31/17 1503 97 %     Weight 01/31/17 1504 254 lb (115.2 kg)     Height 01/31/17 1504 5\' 10"  (1.778 m)     Head Circumference --      Peak Flow --      Pain Score --      Pain Loc --      Pain Edu? --      Excl. in [place]? --     Constitutional: Alert and oriented. Well appearing and in no acute distress. Eyes: Conjunctivae are normal.  Head: Atraumatic. Nose: No congestion/rhinnorhea. Mouth/Throat: Mucous membranes are moist.  Neck: No stridor.   Cardiovascular: Tachycardic, regular rhythm. Grossly normal heart sounds.   Respiratory: Normal respiratory effort.  No retractions. Lungs CTAB. Gastrointestinal: Soft and nontender. No distention. No CVA tenderness. Musculoskeletal: No lower extremity tenderness nor edema.  No joint effusions. Neurologic:  Normal speech and language. No gross focal neurologic deficits are appreciated. Skin:  Skin is warm, dry and intact. No rash noted. Bandages/gauze over the bilateral nephrostomy are CDI. Hazy urine in the bilateral urine bags. Bandages and gauze removed and there is no erythema or exudate at the insertion sites of the bilateral nephrostomies. Psychiatric: Mood and affect are normal. Speech and behavior are normal.  ____________________________________________   LABS (all labs ordered are listed, but only abnormal results are displayed)  Labs Reviewed  COMPREHENSIVE METABOLIC PANEL - Abnormal; Notable for the following:       Result Value   Sodium 132 (*)    Chloride 99 (*)    Glucose, Bld 172 (*)    BUN 29 (*)    Creatinine, Ser 1.90 (*)    Albumin 3.3 (*)    GFR calc non Af Amer 35 (*)    GFR calc Af Amer 41 (*)    All other components within normal limits  CBC WITH DIFFERENTIAL/PLATELET - Abnormal; Notable for the following:    WBC 12.5 (*)    RBC 4.12 (*)    Hemoglobin 11.9  (*)    HCT 34.9 (*)    Neutro Abs 10.3 (*)    Lymphs Abs 0.7 (*)    Monocytes Absolute 1.1 (*)    All other components within normal limits  PROTIME-INR - Abnormal; Notable for the following:    Prothrombin Time 16.2 (*)    All other components within normal limits  CULTURE, BLOOD (ROUTINE X 2)  CULTURE, BLOOD (ROUTINE X 2)  URINE CULTURE  LACTIC ACID, PLASMA  LACTIC ACID, PLASMA  URINALYSIS, COMPLETE (UACMP) WITH MICROSCOPIC  URINALYSIS, COMPLETE (UACMP) WITH MICROSCOPIC   ____________________________________________  EKG   ____________________________________________  RADIOLOGY  Chest x-ray without any acute finding. ____________________________________________   PROCEDURES  Procedure(s) performed:   Procedures  Critical Care performed:   ____________________________________________   INITIAL IMPRESSION / ASSESSMENT AND PLAN / ED COURSE  Pertinent labs & imaging results that were available during my care of the patient were reviewed by me and considered in my medical decision making (see chart for details).  ----------------------------------------- 4:55 PM on 01/31/2017 -----------------------------------------  Sepsis were called. Patient receiving empiric Antibiotics for UTI, hospital acquired. Patient will be admitted to the hospital. Patient as well as family are under setting of the spine on to comply. Signed out to Dr. Anselm Jungling.    ____________________________________________   FINAL CLINICAL IMPRESSION(S) / ED DIAGNOSES  Sepsis. UTI.     NEW MEDICATIONS STARTED DURING THIS VISIT:  New Prescriptions   No medications on file     Note:  This document was prepared using Dragon voice recognition software and may include unintentional dictation errors.     Orbie Pyo, MD 01/31/17 [phone]

## 2017-01-31 NOTE — Progress Notes (Signed)
Patient came to my office today complaining of 102.6 fever. I discussed with the patient, his sister, and daughter that it is important for him to be emergently evaluated in the emergency department. I'm concerned that he has a fever and metastatic rectal cancer in the setting of planned chemotherapy in the near future. I explained to them that there is no urological intervention at this time as his nephrostomy tubes are draining. He needs to be worked up for more serious causes of his fever which is outside the scope of urological care. The family voiced understanding and will present to the emergency department.

## 2017-01-31 NOTE — Consult Note (Addendum)
Patient was seen and evaluated by myself in the office today. Urology consult noted on review of his chart this evening. ED notes/vitals were reviewed (Tmax of 103.2 and tachycardia noted). Nephrostomy tubes draining bilaterally on exam today.   Recommendations: -Patient will always have a positive U/A and urine culture with indwelling nephrostomy tubes. Agree with treating for UTI given apparent sepsis.  -However, strongly encourage ruling out all other potential sources of sepsis before definitively diagnosing UTI as the source. -Nephrostomy tubes recently placed and draining. No intervention indicated. -Patient will need nephrostomy tubes exchanged by IR in approximately 2.5 months. He should follow up with urology in about 2 months to arrange for this. He needs no further urologic evaluation or intervention until that time unless his nephrostomy tubes stop draining. 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  -I expect for the urine output to be significantly higher from the right nephrostomy compared to the left due to previous long standing left hydronephrosis causing permanent left renal damage. -No urologic intervention indicated or further consultation required.

## 2017-02-01 ENCOUNTER — Encounter: Payer: Self-pay | Admitting: Internal Medicine

## 2017-02-01 ENCOUNTER — Ambulatory Visit: Payer: Medicare Other

## 2017-02-01 ENCOUNTER — Telehealth: Payer: Self-pay

## 2017-02-01 LAB — URINALYSIS, COMPLETE (UACMP) WITH MICROSCOPIC
BACTERIA UA: NONE SEEN
Bilirubin Urine: NEGATIVE
GLUCOSE, UA: NEGATIVE mg/dL
Ketones, ur: NEGATIVE mg/dL
NITRITE: NEGATIVE
Protein, ur: NEGATIVE mg/dL
SPECIFIC GRAVITY, URINE: 1.01 (ref 1.005–1.030)
Squamous Epithelial / LPF: NONE SEEN
pH: 5 (ref 5.0–8.0)

## 2017-02-01 LAB — BASIC METABOLIC PANEL
Anion gap: 6 (ref 5–15)
BUN: 24 mg/dL — AB (ref 6–20)
CALCIUM: 8.3 mg/dL — AB (ref 8.9–10.3)
CO2: 24 mmol/L (ref 22–32)
Chloride: 102 mmol/L (ref 101–111)
Creatinine, Ser: 1.61 mg/dL — ABNORMAL HIGH (ref 0.61–1.24)
GFR calc Af Amer: 50 mL/min — ABNORMAL LOW (ref >60)
GFR, EST NON AFRICAN AMERICAN: 43 mL/min — AB (ref >60)
GLUCOSE: 124 mg/dL — AB (ref 65–99)
POTASSIUM: 3.9 mmol/L (ref 3.5–5.1)
SODIUM: 132 mmol/L — AB (ref 135–145)

## 2017-02-01 LAB — CBC
HEMATOCRIT: 31.7 % — AB (ref 40.0–52.0)
Hemoglobin: 11.2 g/dL — ABNORMAL LOW (ref 13.0–18.0)
MCH: 30 pg (ref 26.0–34.0)
MCHC: 35.3 g/dL (ref 32.0–36.0)
MCV: 84.9 fL (ref 80.0–100.0)
Platelets: 216 10*3/uL (ref 150–440)
RBC: 3.74 MIL/uL — ABNORMAL LOW (ref 4.40–5.90)
RDW: 13.2 % (ref 11.5–14.5)
WBC: 9.2 10*3/uL (ref 3.8–10.6)

## 2017-02-01 LAB — GLUCOSE, CAPILLARY
GLUCOSE-CAPILLARY: 118 mg/dL — AB (ref 65–99)
GLUCOSE-CAPILLARY: 147 mg/dL — AB (ref 65–99)
GLUCOSE-CAPILLARY: 130 mg/dL — AB (ref 65–99)
GLUCOSE-CAPILLARY: 146 mg/dL — AB (ref 65–99)

## 2017-02-01 LAB — LACTIC ACID, PLASMA: Lactic Acid, Venous: 1 mmol/L (ref 0.5–1.9)

## 2017-02-01 MED ORDER — SODIUM CHLORIDE 0.9 % IV SOLN
INTRAVENOUS | Status: DC
  Administered 2017-02-01 – 2017-02-02 (×3): via INTRAVENOUS

## 2017-02-01 NOTE — Progress Notes (Signed)
[place] at [facility]                                                                                                                                                                                  Patient Demographics   Donald Townsend, is a 66 y.o. male, DOB - Mar 15, 1951, QQV:[id number]  Admit date - 01/31/2017   Admitting Physician [name], MD  Outpatient Primary MD for the patient is Dion Body, MD   LOS - 1  Subjective: Patient known to me from recent hospitalization where he required bilateral nephrostomy tubes Patient was seen by his urologist yesterday for follow-up when he was noted to have fevers he was sent to the emergency room for admission Patient currently complains of feeling weak, and shaky Review of Systems:   CONSTITUTIONAL: No documented fever. Positivefatigue,  Positive weakness. No weight gain, no weight loss.  EYES: No blurry or double vision.  ENT: No tinnitus. No postnasal drip. No redness of the oropharynx.  RESPIRATORY: No cough, no wheeze, no hemoptysis. No dyspnea.  CARDIOVASCULAR: No chest pain. No orthopnea. No palpitations. No syncope.  GASTROINTESTINAL: No nausea, no vomiting or diarrhea. No abdominal pain. No melena or hematochezia.  GENITOURINARY: No dysuria or hematuria.  ENDOCRINE: No polyuria or nocturia. No heat or cold intolerance.  HEMATOLOGY: No anemia. No bruising. No bleeding.  INTEGUMENTARY: No rashes. No lesions.  MUSCULOSKELETAL: No arthritis. No swelling. No gout.  NEUROLOGIC: No numbness, tingling, or ataxia. No seizure-type activity.  PSYCHIATRIC: No anxiety. No insomnia. No ADD.    Vitals:   Vitals:   02/01/17 0802 02/01/17 1132 02/01/17 1152 02/01/17 1303  BP:  138/69 (!) 100/59 122/66  Pulse:  86 82 71  Resp:    16  Temp: 99.9 F (37.7 C) 99.7 F (37.6 C)  (!) 102.3 F (39.1 C)  TempSrc: Oral Oral  Oral  SpO2:  100%  96%  Weight:      Height:        Wt Readings from  Last 3 Encounters:  01/31/17 254 lb (115.2 kg)  01/28/17 253 lb 12.8 oz (115.1 kg)  01/24/17 258 lb (117 kg)     Intake/Output Summary (Last 24 hours) at 02/01/17 1303 Last data filed at 02/01/17 0600  Gross per 24 hour  Intake                0 ml  Output             1150 ml  Net            -1150 ml    Physical Exam:   GENERAL: Pleasant-appearing in no apparent distress.  HEAD, EYES, EARS, NOSE AND THROAT: Atraumatic, normocephalic. Extraocular muscles are intact. Pupils equal and reactive to light. Sclerae anicteric. No conjunctival injection. No oro-pharyngeal erythema.  NECK: Supple. There is no jugular venous distention. No bruits, no lymphadenopathy, no thyromegaly.  HEART: Regular rate and rhythm,. No murmurs, no rubs, no clicks.  LUNGS: Clear to auscultation bilaterally. No rales or rhonchi. No wheezes.  ABDOMEN: Soft, flat, nontender, nondistended. Has good bowel sounds. No hepatosplenomegaly appreciated.  EXTREMITIES: No evidence of any cyanosis, clubbing, or peripheral edema.  +2 pedal and radial pulses bilaterally.  NEUROLOGIC: The patient is alert, awake, and oriented x3 with no focal motor or sensory deficits appreciated bilaterally.  SKIN: Moist and warm with no rashes appreciated.  Psych: Not anxious, depressed LN: No inguinal LN enlargement    Antibiotics   Anti-infectives    Start     Dose/Rate Route Frequency Ordered Stop   02/01/17 0600  ceFEPIme (MAXIPIME) 2 g in dextrose 5 % 50 mL IVPB     2 g 100 mL/hr over 30 Minutes Intravenous Every 12 hours 01/31/17 1900     01/31/17 1800  ceFEPIme (MAXIPIME) 2 g in dextrose 5 % 50 mL IVPB  Status:  Discontinued     2 g 100 mL/hr over 30 Minutes Intravenous Every 12 hours 01/31/17 1732 01/31/17 1859   01/31/17 1700  ceFEPIme (MAXIPIME) 2 g in dextrose 5 % 50 mL IVPB     2 g 100 mL/hr over 30 Minutes Intravenous  Once 01/31/17 1645 01/31/17 1821      Medications   Scheduled Meds: . apixaban  5 mg Oral Q12H  .  aspirin EC  81 mg Oral Daily  . digoxin  0.25 mg Oral Daily  . furosemide  40 mg Oral Daily  . insulin aspart  0-9 Units Subcutaneous TID WC  . insulin aspart  3 Units Subcutaneous TID WC  . insulin glargine  10 Units Subcutaneous QHS  . losartan  50 mg Oral Daily  . metoprolol tartrate  25 mg Oral BID  . pravastatin  20 mg Oral Daily  . tamsulosin  0.4 mg Oral Daily   Continuous Infusions: . ceFEPime (MAXIPIME) IV Stopped (02/01/17 0548)   PRN Meds:.acetaminophen, docusate sodium, HYDROcodone-acetaminophen   Data Review:   Micro Results Recent Results (from the past 240 hour(s))  Culture, blood (Routine x 2)     Status: None (Preliminary result)   Collection Time: 01/31/17  3:09 PM  Result Value Ref Range Status   Specimen Description BLOOD BLOOD LEFT HAND  Final   Special Requests   Final    BOTTLES DRAWN AEROBIC AND ANAEROBIC Blood Culture adequate volume   Culture NO GROWTH < 12 HOURS  Final   Report Status PENDING  Incomplete  Culture, blood (Routine x 2)     Status: None (Preliminary result)   Collection Time: 01/31/17  3:09 PM  Result Value Ref Range Status   Specimen Description BLOOD BLOOD RIGHT ARM  Final   Special Requests   Final    BOTTLES DRAWN AEROBIC AND ANAEROBIC Blood Culture adequate volume   Culture NO GROWTH < 12 HOURS  Final   Report Status PENDING  Incomplete    Radiology Reports Dg Chest 2 View  Result Date: 01/31/2017 CLINICAL DATA:  Fever and chills, recent port placement and biopsy EXAM: CHEST  2 VIEW COMPARISON:  None. FINDINGS: Cardiac shadow is within normal limits. The lungs are well aerated bilaterally. No focal infiltrate or sizable effusion is  seen. Right-sided chest wall port is noted in satisfactory position. No acute bony abnormality is noted. IMPRESSION: No active cardiopulmonary disease. Electronically Signed   By: [name] M.D.   On: 01/31/2017 16:26   Nm Pet Image Restag (ps) Skull Base To Thigh  Result Date:  01/22/2017 CLINICAL DATA:  Subsequent treatment strategy for followup of rectal cancer. EXAM: NUCLEAR MEDICINE PET SKULL BASE TO THIGH TECHNIQUE: 13.0 mCi F-18 FDG was injected intravenously. Full-ring PET imaging was performed from the skull base to thigh after the radiotracer. CT data was obtained and used for attenuation correction and anatomic localization. FASTING BLOOD GLUCOSE:  Value: 145 mg/dl COMPARISON:  CT 01/11/2017. FINDINGS: NECK: Low left jugular and supraclavicular hypermetabolic nodes. An index left supraclavicular nodal mass measures 1.7 cm and a S.U.V. max of 10.0 on image 69/series 3. Right-sided carotid atherosclerosis. Right maxillary mucous retention cyst or polyp. CHEST: High left mediastinal node measures 12 mm and a S.U.V. max of 4.6 on image 83/series 3. A right infrahilar node measures a S.U.V. max of 8.0 on image 100/ series 3. Not well visualized on CT. Right retrocrural node measures 8 mm and a S.U.V. max of 6.9 on image 146/series 3. Mild cardiomegaly with multivessel coronary artery atherosclerosis. Pulmonary artery enlargement, 3.7 cm outflow tract. Mild centrilobular emphysema. ABDOMEN/PELVIS: Extensive hypermetabolic retroperitoneal adenopathy. Example left periaortic nodes measure up to 1.5 cm and a S.U.V. max of 16.5 on image 173/series 3. Bilateral pelvic sidewall adenopathy, with a right external iliac node measuring 1.5 cm and a S.U.V. max of 23.2. Bilateral hypermetabolic inguinal nodes, with a index left inguinal node measuring 1.6 cm and a S.U.V. max of 6.2 on image 282/series 3. Heterogeneous soft tissue density about the pelvic cul-de-sac corresponds to hypermetabolism. This measures a S.U.V. max of 13.7, including image 20 of 51/series 3. This presumably represents a combination of prostate and surrounding recurrent tumor. Left adrenal hypermetabolism which is felt to correspond to minimal nodularity. This measures 11 mm and a S.U.V. max of 5.8 on image 155/series 3.  Moderate hepatic steatosis. Placement of bilateral nephrostomy catheters. Small volume perinephric hematoma posteriorly on image 173/ series 3. Improved appearance of right greater than left hydronephrosis. Descending colostomy. Air within left renal collecting system and urinary bladder. Thick walled bladder is similar. SKELETON: Degenerative partial fusion of the bilateral sacroiliac joints. IMPRESSION: 1. Hypermetabolism corresponding to heterogeneous soft tissue density in the pelvic cul-de-sac. This likely represents local tumor recurrence, superimposed upon the prostate. 2. Nodal metastasis within the neck, chest, abdomen, and pelvis. 3. Placement of nephrostomy catheters, with improvement in bilateral hydronephrosis. Small volume right perinephric hematoma. 4. Minimal left adrenal nodularity and hypermetabolism, for which metastatic disease cannot be excluded. 5. Coronary artery atherosclerosis. Aortic Atherosclerosis (ICD10-I70.0). 6. Pulmonary artery enlargement suggests pulmonary arterial hypertension. 7. Hepatic steatosis. 8. Air in the left renal collecting system is likely due to recent nephrostomy. Air within the bladder could be secondary. Similar thick walled bladder could be radiation induced. Infectious cystitis cannot be excluded. Electronically Signed   By: [name] M.D.   On: 01/22/2017 14:30   [place] Core Biopsy (lymph Nodes)  Addendum Date: 01/24/2017   ADDENDUM REPORT: 01/24/2017 15:34 ADDENDUM: Correction to the sedation medicines:  Fentanyl 25 mcg, Versed 1 mg Electronically Signed   By: [name] M.D.   On: 01/24/2017 15:34   Result Date: 01/24/2017 INDICATION: 66 year old with a history of rectal cancer. Patient now has evidence for diffuse metastatic disease including multiple enlarged lymph nodes.  Tissue diagnosis is needed. EXAM: ULTRASOUND GUIDED CORE BIOPSY OF RIGHT INGUINAL LYMPH NODE MEDICATIONS: None. ANESTHESIA/SEDATION: Fentanyl 50 mcg IV; Versed 1 mg IV Moderate Sedation  Time:  16 minutes The patient was continuously monitored during the procedure by the interventional radiology nurse under my direct supervision. PROCEDURE: The procedure, risks, benefits, and alternatives were explained to the patient. Questions regarding the procedure were encouraged and answered. The patient understands and consents to the procedure. Ultrasound demonstrated enlarged right inguinal lymph nodes. A slightly irregular hypoechoic lymph node was selected for biopsy. Right groin was prepped with chlorhexidine and sterile field was created. Skin was anesthetized with 1% lidocaine. Using ultrasound guidance, core biopsies were obtained within the right inguinal lymph node. Total of 6 core biopsies were obtained. Five biopsies were placed in formalin and 1 was placed on a Telfa pad. Bandage placed over the puncture site. COMPLICATIONS: None immediate. FINDINGS: Enlarged bilateral inguinal lymph nodes. Core biopsies obtained from a right inguinal lymph node. No significant bleeding or hematoma formation following the core biopsies. IMPRESSION: Successful ultrasound-guided core biopsies of a right inguinal lymph node. Electronically Signed: By: [name] M.D. On: 01/24/2017 11:24   Ct Hematuria Workup  Result Date: 01/11/2017 CLINICAL DATA:  Followup colon cancer. EXAM: CT ABDOMEN AND PELVIS WITHOUT AND WITH CONTRAST TECHNIQUE: Multidetector CT imaging of the abdomen and pelvis was performed following the standard protocol before and following the bolus administration of intravenous contrast. CONTRAST:  119mL ISOVUE-300 IOPAMIDOL (ISOVUE-300) INJECTION 61% COMPARISON:  CT scan 04/27/2016 and 08/24/2014 FINDINGS: Lower chest: The lung bases are clear of acute process. No pleural effusion or pulmonary lesions. The heart is normal in size. No pericardial effusion. Stable coronary artery calcifications. The distal esophagus and aorta are unremarkable. Hepatobiliary: No focal hepatic lesions or intrahepatic  biliary dilatation the gallbladder appears normal. No common bile duct dilatation. Pancreas: No mass, inflammation or ductal dilatation. Spleen: Normal size.  No focal lesions. Adrenals/Urinary Tract: The adrenal glands are normal in stable. Bilateral hydroureteronephrosis down to enlarging pelvic mass. The right-sided hydroureteronephrosis is new and the left side is progressive. The bladder contains a Foley catheter. There is diffuse asymmetric bladder wall thickening which appears progressive. The delayed images demonstrate compression but not complete obstruction of the right ureter. Stomach/Bowel: The stomach, duodenum, small bowel and colon are grossly normal without oral contrast. No acute inflammatory changes or obstructive findings. Stable left lower quadrant colostomy. Vascular/Lymphatic: Stable atherosclerotic calcifications involving the aorta and iliac arteries. No aneurysm. New retroperitoneal lymphadenopathy. 15 mm periaortic lymph node on image number 38. 14 mm right-sided retroperitoneal lymph node posterior to the IVC on image number 41. Left para-aortic lymph node on image number 50 measures 13.5 mm. A left common iliac vein stent is noted. Reproductive: The prostate gland is grossly normal. The seminal vesicles are difficult to identified. Other: Enlarging presacral soft tissue mass which appears confluent with the base of the bladder. Central fluid collection appears stable. The presacral tumor is extending higher up into the pelvis where it is likely obstructing both ureters. Bilateral inguinal lymphadenopathy is new. Index node on the right side on image number 94 measures 14.5 mm an index node on the left side on image number 102 measures 17 mm. Musculoskeletal: No definite metastatic osseous disease. IMPRESSION: 1. 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 New right-sided hydroureteronephrosis with compression  of the distal ureter but no complete obstruction. The left ureter appears  completely obstructed. 2. New retroperitoneal lymphadenopathy. 3. No findings for bowel obstruction. Left lower quadrant colostomy is stable. 4. No definite CT findings for hepatic metastatic disease. 5. New bilateral inguinal lymphadenopathy. Electronically Signed   By: Marijo Sanes M.D.   On: 01/11/2017 13:18   Ir Nephrostomy Placement Left  Result Date: 01/19/2017 EXAM: IR NEPHROSTOMY PLACEMENT LEFT; IR NEPHROSTOMY PLACEMENT RIGHT COMPARISON:  None. MEDICATIONS: 2 g IV Ancef; The antibiotic was administered in an appropriate time frame prior to skin puncture. ANESTHESIA/SEDATION: Fentanyl 100 mcg IV; Versed 3.0 mg IV Moderate Sedation Time:  60 minutes. The patient was continuously monitored during the procedure by the interventional radiology nurse under my direct supervision. CONTRAST:  10 mL Isovue-300 - administered into the collecting system(s) FLUOROSCOPY TIME:  Fluoroscopy Time: 1 minute.  (52 mGy). COMPLICATIONS: None immediate. PROCEDURE: Informed written consent was obtained from the patient after a thorough discussion of the procedural risks, benefits and alternatives. All questions were addressed. Maximal Sterile Barrier Technique was utilized including caps, mask, sterile gowns, sterile gloves, sterile drape, hand hygiene and skin antiseptic. A timeout was performed prior to the initiation of the procedure. Both kidneys were localized ultrasound. Under ultrasound guidance, access of the collecting systems was performed with 21 gauge needles. Transitional dilator is replaced. Tracts were dilated over guidewires and 10 French nephrostomy tubes were advanced bilaterally. Both tubes were injected with contrast to confirm position and fluoroscopic images saved. The catheters were connected to gravity bags and secured at the skin with Prolene retention sutures and StatLock devices. FINDINGS: There is significant hydronephrosis  on the left. After lower pole access, a nephrostomy tube was advanced into the central collecting system and is draining well after placement. Right-sided hydronephrosis is present, to a lesser degree than on the left. Puncture of the right kidney was more difficult by ultrasound guidance, resulting in a more central puncture of the collecting system. A nephrostomy tube was advanced into the collecting system such that it is partially formed and extends just into the proximal ureter. The tube is draining well after placement. IMPRESSION: Placement of bilateral percutaneous nephrostomy tubes. Bilateral 10 French nephrostomy tubes were placed and attached to gravity bag drainage. Electronically Signed   By: Aletta Edouard M.D.   On: 01/19/2017 15:11   Ir Nephrostomy Placement Right  Result Date: 01/19/2017 EXAM: IR NEPHROSTOMY PLACEMENT LEFT; IR NEPHROSTOMY PLACEMENT RIGHT COMPARISON:  None. MEDICATIONS: 2 g IV Ancef; The antibiotic was administered in an appropriate time frame prior to skin puncture. ANESTHESIA/SEDATION: Fentanyl 100 mcg IV; Versed 3.0 mg IV Moderate Sedation Time:  60 minutes. The patient was continuously monitored during the procedure by the interventional radiology nurse under my direct supervision. CONTRAST:  10 mL Isovue-300 - administered into the collecting system(s) FLUOROSCOPY TIME:  Fluoroscopy Time: 1 minute.  (52 mGy). COMPLICATIONS: None immediate. PROCEDURE: Informed written consent was obtained from the patient after a thorough discussion of the procedural risks, benefits and alternatives. All questions were addressed. Maximal Sterile Barrier Technique was utilized including caps, mask, sterile gowns, sterile gloves, sterile drape, hand hygiene and skin antiseptic. A timeout was performed prior to the initiation of the procedure. Both kidneys were localized ultrasound. Under ultrasound guidance, access of the collecting systems was performed with 21 gauge needles. Transitional  dilator is replaced. Tracts were dilated over guidewires and 10 French nephrostomy tubes were advanced bilaterally. Both tubes were injected with contrast to confirm position and fluoroscopic images saved. The catheters were connected to gravity bags and secured  at the skin with Prolene retention sutures and StatLock devices. FINDINGS: There is significant hydronephrosis on the left. After lower pole access, a nephrostomy tube was advanced into the central collecting system and is draining well after placement. Right-sided hydronephrosis is present, to a lesser degree than on the left. Puncture of the right kidney was more difficult by ultrasound guidance, resulting in a more central puncture of the collecting system. A nephrostomy tube was advanced into the collecting system such that it is partially formed and extends just into the proximal ureter. The tube is draining well after placement. IMPRESSION: Placement of bilateral percutaneous nephrostomy tubes. Bilateral 10 French nephrostomy tubes were placed and attached to gravity bag drainage. Electronically Signed   By: Aletta Edouard M.D.   On: 01/19/2017 15:11   Ir Fluoro Guide Port Insertion Right  Result Date: 01/24/2017 INDICATION: 66 year old with history of rectal cancer and new lymphadenopathy. Port-A-Cath needed for treatment. EXAM: FLUOROSCOPIC AND ULTRASOUND GUIDED PLACEMENT OF A SUBCUTANEOUS PORT COMPARISON:  None. MEDICATIONS: Ancef 2 g; The antibiotic was administered within an appropriate time interval prior to skin puncture. ANESTHESIA/SEDATION: Versed 2.0 mg IV; Fentanyl 75 mcg IV; Moderate Sedation Time:  30 minutes The patient was continuously monitored during the procedure by the interventional radiology nurse under my direct supervision. FLUOROSCOPY TIME:  30 seconds, 12 mGy COMPLICATIONS: None immediate. PROCEDURE: The procedure, risks, benefits, and alternatives were explained to the patient. Questions regarding the procedure were  encouraged and answered. The patient understands and consents to the procedure. Patient was placed supine on the interventional table. Ultrasound confirmed a patent right internal jugular vein. The right chest and neck were cleaned with a skin antiseptic and a sterile drape was placed. Maximal barrier sterile technique was utilized including caps, mask, sterile gowns, sterile gloves, sterile drape, hand hygiene and skin antiseptic. The right neck was anesthetized with 1% lidocaine. Small incision was made in the right neck with a blade. Micropuncture set was placed in the right internal jugular vein with ultrasound guidance. The micropuncture wire was used for measurement purposes. The right chest was anesthetized with 1% lidocaine with epinephrine. #15 blade was used to make an incision and a subcutaneous port pocket was formed. Epworth was assembled. Subcutaneous tunnel was formed with a stiff tunneling device. The port catheter was brought through the subcutaneous tunnel. The port was placed in the subcutaneous pocket. The micropuncture set was exchanged for a peel-away sheath. The catheter was placed through the peel-away sheath and the tip was positioned at superior cavoatrial junction. Catheter placement was confirmed with fluoroscopy. The port was accessed and flushed with heparinized saline. The port pocket was closed using two layers of absorbable sutures and Dermabond. The vein skin site was closed using a single layer of absorbable suture and Dermabond. Sterile dressings were applied. Patient tolerated the procedure well without an immediate complication. Ultrasound and fluoroscopic images were taken and saved for this procedure. IMPRESSION: Placement of a subcutaneous port device. Electronically Signed   By: [name] M.D.   On: 01/24/2017 15:30     CBC  Recent Labs Lab 01/28/17 1321 01/31/17 1509 02/01/17 0046  WBC 13.5* 12.5* 9.2  HGB 12.2* 11.9* 11.2*  HCT 35.7* 34.9* 31.7*   PLT 244 253 216  MCV 86.1 84.9 84.9  MCH 29.4 28.9 30.0  MCHC 34.2 34.0 35.3  RDW 13.4 13.6 13.2  LYMPHSABS 1.2 0.7*  --   MONOABS 0.9 1.1*  --   EOSABS 1.1* 0.4  --  BASOSABS 0.1 0.1  --     Chemistries   Recent Labs Lab 01/28/17 1317 01/31/17 1509 02/01/17 0046  NA 136 132* 132*  K 4.5 4.3 3.9  CL 104 99* 102  CO2 22 22 24   GLUCOSE 181* 172* 124*  BUN 21* 29* 24*  CREATININE 1.76* 1.90* 1.61*  CALCIUM 8.8* 9.5 8.3*  AST 25 40  --   ALT 26 36  --   ALKPHOS 80 95  --   BILITOT 0.8 0.7  --    ------------------------------------------------------------------------------------------------------------------ estimated creatinine clearance is 58.2 mL/min (A) (by C-G formula based on SCr of 1.61 mg/dL (H)). ------------------------------------------------------------------------------------------------------------------ No results for input(s): HGBA1C in the last 72 hours. ------------------------------------------------------------------------------------------------------------------ No results for input(s): CHOL, HDL, LDLCALC, TRIG, CHOLHDL, LDLDIRECT in the last 72 hours. ------------------------------------------------------------------------------------------------------------------ No results for input(s): TSH, T4TOTAL, T3FREE, THYROIDAB in the last 72 hours.  Invalid input(s): FREET3 ------------------------------------------------------------------------------------------------------------------ No results for input(s): VITAMINB12, FOLATE, FERRITIN, TIBC, IRON, RETICCTPCT in the last 72 hours.  Coagulation profile  Recent Labs Lab 01/31/17 1509  INR 1.31    No results for input(s): DDIMER in the last 72 hours.  Cardiac Enzymes No results for input(s): CKMB, TROPONINI, MYOGLOBIN in the last 168 hours.  Invalid input(s): CK ------------------------------------------------------------------------------------------------------------------ Invalid input(s):  [place]   Donald Townsend a 66 y.o. malewith a known history of Colon cancer, diabetes with this, congestive heart failure, atrial fibrillation Who was recently hospitalized for bilateral nephrostomy tube recently discharge comes back with fevers   #Sepsis due to UTI Continue IV ceftriaxone seen by urology recommended to continue nephrostomy care  #Chronic atrial fibrillation Rate controlled continue Toprol Continue eliquis  # hypertension Continue home medication Toprol and hold Cozaar    # colonCancer with recurrence Status post colectomy Has been seen by oncology planned for chemotherapy in the future  #CHF not fluid overloaded continue Lasix  #Diabetes mellitus continue sliding scale and Lantus  #Generalized weakness obtain PT evaluation      Code Status Orders        Start     Ordered   01/31/17 1955  Full code  Continuous     01/31/17 1954    Code Status History    Date Active Date Inactive Code Status Order ID Comments User Context   01/18/2017  6:40 PM 01/21/2017  5:08 PM Full Code [id number]  Nicholes Mango, MD Inpatient   05/08/2016  6:37 PM 05/09/2016  7:34 PM Full Code [id number]  Katha Cabal, MD Inpatient    Advance Directive Documentation     Most Recent Value  Type of Advance Directive  Healthcare Power of Attorney, Living will  Pre-existing out of facility DNR order (yellow form or pink MOST form)  -  "MOST" Form in Place?  -           Consults none   DVT Prophylaxis  eliquis  Lab Results  Component Value Date   PLT 216 02/01/2017     Time Spent in minutes 64min Greater than 50% of time spent in care coordination and counseling patient regarding the condition and plan of care.   Dustin Flock M.D on 02/01/2017 at 1:03 PM  Between 7am to 6pm - Pager - [phone]  After 6pm go to [email or web address] - password [email or web address] [place] [facility]   Office  [phone]

## 2017-02-01 NOTE — Consult Note (Signed)
[facility] Infectious Disease     Reason for Consult: Fever    Referring Physician: Lana Fish Date of Admission:  01/31/2017   Principal Problem:   Sepsis ([place])   HPI: Donald Townsend is a 66 y.o. male admitted with fevers and chills. He has a complicated history and had nephrostomy tubes placed about 2 weeks ago. 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 He had been doing well but developed chills, and high fevers. He also had mild pain with flushing of R nephrostomy tube. The R has been draining normal amt of urine but the L has had limited Uop since it was placed He does have some mid back pain.   Past Medical History:  Diagnosis Date  . A-fib ([place])   . Cancer [name])    Colon  . CHF (congestive heart failure) ([place])   . Diabetes mellitus without complication ([place])   . Dyspnea   . Elevated lipids   . Hypertension   . PVD (peripheral vascular disease) ([place])    Past Surgical History:  Procedure Laterality Date  . COLON RESECTION     with colostomy  . COLONOSCOPY    . CYSTOSCOPY WITH STENT PLACEMENT Bilateral 01/18/2017   Procedure: CYSTOSCOPY WITH STENT PLACEMENT;  Surgeon: [name], MD;  Location: [facility] ORS;  Service: Urology;  Laterality: Bilateral;  . IR FLUORO GUIDE PORT INSERTION RIGHT  01/24/2017  . IR NEPHROSTOMY PLACEMENT LEFT  01/19/2017  . IR NEPHROSTOMY PLACEMENT RIGHT  01/19/2017  . IVC FILTER REMOVAL N/A 08/28/2016   Procedure: IVC Filter Removal;  Surgeon: Katha Cabal, MD;  Location: [place] CV LAB;  Service: Cardiovascular;  Laterality: N/A;  . LOWER EXTREMITY VENOGRAPHY Left 12/18/2016   Procedure: Lower Extremity Venography;  Surgeon: Katha Cabal, MD;  Location: [place] CV LAB;  Service: Cardiovascular;  Laterality: Left;  . PERIPHERAL VASCULAR CATHETERIZATION Left 05/08/2016   Procedure: Lower Extremity Venography;  Surgeon: Katha Cabal, MD;  Location: [place] CV LAB;  Service:  Cardiovascular;  Laterality: Left;   Social History  Substance Use Topics  . Smoking status: Never Smoker  . Smokeless tobacco: Never Used  . Alcohol use No   Family History  Problem Relation Age of Onset  . Diabetes Mother   . Stroke Mother   . Prostate cancer Brother   . Cancer Sister        Breast cancer  . Kidney cancer Neg Hx   . Bladder Cancer Neg Hx     Allergies: No Known Allergies  Current antibiotics: Antibiotics Given (last 72 hours)    Date/Time Action Medication Dose Rate   01/31/17 1751 New Bag/Given   ceFEPIme (MAXIPIME) 2 g in dextrose 5 % 50 mL IVPB 2 g 100 mL/hr   02/01/17 0518 New Bag/Given   ceFEPIme (MAXIPIME) 2 g in dextrose 5 % 50 mL IVPB 2 g 100 mL/hr      MEDICATIONS: . apixaban  5 mg Oral Q12H  . aspirin EC  81 mg Oral Daily  . digoxin  0.25 mg Oral Daily  . furosemide  40 mg Oral Daily  . insulin aspart  0-9 Units Subcutaneous TID WC  . insulin aspart  3 Units Subcutaneous TID WC  . insulin glargine  10 Units Subcutaneous QHS  . losartan  50 mg Oral Daily  . metoprolol tartrate  25 mg Oral BID  . pravastatin  20 mg Oral Daily  . tamsulosin  0.4 mg  Oral Daily    Review of Systems - 11 systems reviewed and negative per HPI   OBJECTIVE: Temp:  [98.9 F (37.2 C)-102.9 F (39.4 C)] 102.4 F (39.1 C) (10/05 1400) Pulse Rate:  [71-100] 71 (10/05 1303) Resp:  [16-20] 16 (10/05 1303) BP: (100-141)/(59-75) 122/66 (10/05 1303) SpO2:  [96 %-100 %] 96 % (10/05 1303) Physical Exam  Constitutional: He is oriented to person, place, and time. He appears well-developed and well-nourished. No distress.  HENT:  Mouth/Throat: Oropharynx is clear and moist. No oropharyngeal exudate.  Cardiovascular: Normal rate, regular rhythm and normal heart sounds. Exam reveals no gallop and no friction rub.  No murmur heard.  Portacath site R chest wall covered, nontender Pulmonary/Chest: Effort normal and breath sounds normal. No respiratory distress. He has  no wheezes.  Abdominal: Soft. Bowel sounds are normal. He exhibits no distension. There is no tenderness.  Bil nephrostomy tubes R with clear urine, L no urine Lymphadenopathy: He has no cervical adenopathy.  Neurological: He is alert and oriented to person, place, and time.  Skin: Skin is warm and dry. No rash noted. No erythema.  Psychiatric: He has a normal mood and affect. His behavior is normal.     LABS: Results for orders placed or performed during the hospital encounter of 01/31/17 (from the past 48 hour(s))  Comprehensive metabolic panel     Status: Abnormal   Collection Time: 01/31/17  3:09 PM  Result Value Ref Range   Sodium 132 (L) 135 - 145 mmol/L   Potassium 4.3 3.5 - 5.1 mmol/L   Chloride 99 (L) 101 - 111 mmol/L   CO2 22 22 - 32 mmol/L   Glucose, Bld 172 (H) 65 - 99 mg/dL   BUN 29 (H) 6 - 20 mg/dL   Creatinine, Ser 1.90 (H) 0.61 - 1.24 mg/dL   Calcium 9.5 8.9 - 10.3 mg/dL   Total Protein 7.7 6.5 - 8.1 g/dL   Albumin 3.3 (L) 3.5 - 5.0 g/dL   AST 40 15 - 41 U/L   ALT 36 17 - 63 U/L   Alkaline Phosphatase 95 38 - 126 U/L   Total Bilirubin 0.7 0.3 - 1.2 mg/dL   GFR calc non Af Amer 35 (L) >60 mL/min   GFR calc Af Amer 41 (L) >60 mL/min    Comment: (NOTE) The eGFR has been calculated using the CKD EPI equation. This calculation has not been validated in all clinical situations. eGFR's persistently <60 mL/min signify possible Chronic Kidney Disease.    Anion gap 11 5 - 15  Lactic acid, plasma     Status: None   Collection Time: 01/31/17  3:09 PM  Result Value Ref Range   Lactic Acid, Venous 1.6 0.5 - 1.9 mmol/L  CBC with Differential     Status: Abnormal   Collection Time: 01/31/17  3:09 PM  Result Value Ref Range   WBC 12.5 (H) 3.8 - 10.6 K/uL   RBC 4.12 (L) 4.40 - 5.90 MIL/uL   Hemoglobin 11.9 (L) 13.0 - 18.0 g/dL   HCT 34.9 (L) 40.0 - 52.0 %   MCV 84.9 80.0 - 100.0 fL   MCH 28.9 26.0 - 34.0 pg   MCHC 34.0 32.0 - 36.0 g/dL   RDW 13.6 11.5 - 14.5 %    Platelets 253 150 - 440 K/uL   Neutrophils Relative % 81 %   Neutro Abs 10.3 (H) 1.4 - 6.5 K/uL   Lymphocytes Relative 6 %   Lymphs Abs 0.7 (L)  1.0 - 3.6 K/uL   Monocytes Relative 9 %   Monocytes Absolute 1.1 (H) 0.2 - 1.0 K/uL   Eosinophils Relative 3 %   Eosinophils Absolute 0.4 0 - 0.7 K/uL   Basophils Relative 1 %   Basophils Absolute 0.1 0 - 0.1 K/uL  Protime-INR     Status: Abnormal   Collection Time: 01/31/17  3:09 PM  Result Value Ref Range   Prothrombin Time 16.2 (H) 11.4 - 15.2 seconds   INR 1.31   Culture, blood (Routine x 2)     Status: None (Preliminary result)   Collection Time: 01/31/17  3:09 PM  Result Value Ref Range   Specimen Description BLOOD BLOOD LEFT HAND    Special Requests      BOTTLES DRAWN AEROBIC AND ANAEROBIC Blood Culture adequate volume   Culture NO GROWTH < 12 HOURS    Report Status PENDING   Culture, blood (Routine x 2)     Status: None (Preliminary result)   Collection Time: 01/31/17  3:09 PM  Result Value Ref Range   Specimen Description BLOOD BLOOD RIGHT ARM    Special Requests      BOTTLES DRAWN AEROBIC AND ANAEROBIC Blood Culture adequate volume   Culture NO GROWTH < 12 HOURS    Report Status PENDING   Urinalysis, Complete w Microscopic     Status: Abnormal   Collection Time: 01/31/17  3:09 PM  Result Value Ref Range   Color, Urine YELLOW (A) YELLOW   APPearance HAZY (A) CLEAR   Specific Gravity, Urine 1.005 1.005 - 1.030   pH 7.0 5.0 - 8.0   Glucose, UA NEGATIVE NEGATIVE mg/dL   Hgb urine dipstick MODERATE (A) NEGATIVE   Bilirubin Urine NEGATIVE NEGATIVE   Ketones, ur NEGATIVE NEGATIVE mg/dL   Protein, ur 100 (A) NEGATIVE mg/dL   Nitrite POSITIVE (A) NEGATIVE   Leukocytes, UA LARGE (A) NEGATIVE   RBC / HPF TOO NUMEROUS TO COUNT 0 - 5 RBC/hpf   WBC, UA TOO NUMEROUS TO COUNT 0 - 5 WBC/hpf   Bacteria, UA NONE SEEN NONE SEEN   Squamous Epithelial / LPF NONE SEEN NONE SEEN   Mucus PRESENT   Urinalysis, Complete w Microscopic      Status: Abnormal   Collection Time: 01/31/17  3:09 PM  Result Value Ref Range   Color, Urine YELLOW (A) YELLOW   APPearance HAZY (A) CLEAR   Specific Gravity, Urine 1.010 1.005 - 1.030   pH 5.0 5.0 - 8.0   Glucose, UA NEGATIVE NEGATIVE mg/dL   Hgb urine dipstick MODERATE (A) NEGATIVE   Bilirubin Urine NEGATIVE NEGATIVE   Ketones, ur NEGATIVE NEGATIVE mg/dL   Protein, ur 30 (A) NEGATIVE mg/dL   Nitrite POSITIVE (A) NEGATIVE   Leukocytes, UA MODERATE (A) NEGATIVE   RBC / HPF 6-30 0 - 5 RBC/hpf   WBC, UA TOO NUMEROUS TO COUNT 0 - 5 WBC/hpf   Bacteria, UA FEW (A) NONE SEEN   Squamous Epithelial / LPF NONE SEEN NONE SEEN  Urine Culture     Status: None (Preliminary result)   Collection Time: 01/31/17  3:09 PM  Result Value Ref Range   Specimen Description URINE, RANDOM    Special Requests NONE    Culture      CULTURE REINCUBATED FOR BETTER GROWTH Performed at [facility], [address], [place], [place] [address]    Report Status PENDING   Lactic acid, plasma     Status: Abnormal   Collection  Time: 01/31/17  6:41 PM  Result Value Ref Range   Lactic Acid, Venous 2.1 (HH) 0.5 - 1.9 mmol/L    Comment: CRITICAL RESULT CALLED TO, READ BACK BY AND VERIFIED WITH STEVEN SIKES ON 01/31/17 AT 1945 [facility]   Digoxin level     Status: None   Collection Time: 01/31/17  6:41 PM  Result Value Ref Range   Digoxin Level 1.2 0.8 - 2.0 ng/mL  Glucose, capillary     Status: Abnormal   Collection Time: 01/31/17 11:03 PM  Result Value Ref Range   Glucose-Capillary 117 (H) 65 - 99 mg/dL  Basic metabolic panel     Status: Abnormal   Collection Time: 02/01/17 12:46 AM  Result Value Ref Range   Sodium 132 (L) 135 - 145 mmol/L   Potassium 3.9 3.5 - 5.1 mmol/L   Chloride 102 101 - 111 mmol/L   CO2 24 22 - 32 mmol/L   Glucose, Bld 124 (H) 65 - 99 mg/dL   BUN 24 (H) 6 - 20 mg/dL   Creatinine, Ser 1.61 (H) 0.61 - 1.24 mg/dL   Calcium 8.3 (L) 8.9 - 10.3 mg/dL   GFR calc non Af Amer 43 (L) >60  mL/min   GFR calc Af Amer 50 (L) >60 mL/min    Comment: (NOTE) The eGFR has been calculated using the CKD EPI equation. This calculation has not been validated in all clinical situations. eGFR's persistently <60 mL/min signify possible Chronic Kidney Disease.    Anion gap 6 5 - 15  CBC     Status: Abnormal   Collection Time: 02/01/17 12:46 AM  Result Value Ref Range   WBC 9.2 3.8 - 10.6 K/uL   RBC 3.74 (L) 4.40 - 5.90 MIL/uL   Hemoglobin 11.2 (L) 13.0 - 18.0 g/dL   HCT 31.7 (L) 40.0 - 52.0 %   MCV 84.9 80.0 - 100.0 fL   MCH 30.0 26.0 - 34.0 pg   MCHC 35.3 32.0 - 36.0 g/dL   RDW 13.2 11.5 - 14.5 %   Platelets 216 150 - 440 K/uL  Lactic acid, plasma     Status: None   Collection Time: 02/01/17 12:46 AM  Result Value Ref Range   Lactic Acid, Venous 1.0 0.5 - 1.9 mmol/L  Glucose, capillary     Status: Abnormal   Collection Time: 02/01/17  8:01 AM  Result Value Ref Range   Glucose-Capillary 118 (H) 65 - 99 mg/dL  Glucose, capillary     Status: Abnormal   Collection Time: 02/01/17 11:38 AM  Result Value Ref Range   Glucose-Capillary 147 (H) 65 - 99 mg/dL   Comment 1 Notify RN    No components found for: ESR, C REACTIVE PROTEIN MICRO: Recent Results (from the past 720 hour(s))  Culture, blood (Routine x 2)     Status: None (Preliminary result)   Collection Time: 01/31/17  3:09 PM  Result Value Ref Range Status   Specimen Description BLOOD BLOOD LEFT HAND  Final   Special Requests   Final    BOTTLES DRAWN AEROBIC AND ANAEROBIC Blood Culture adequate volume   Culture NO GROWTH < 12 HOURS  Final   Report Status PENDING  Incomplete  Culture, blood (Routine x 2)     Status: None (Preliminary result)   Collection Time: 01/31/17  3:09 PM  Result Value Ref Range Status   Specimen Description BLOOD BLOOD RIGHT ARM  Final   Special Requests   Final    BOTTLES DRAWN AEROBIC  AND ANAEROBIC Blood Culture adequate volume   Culture NO GROWTH < 12 HOURS  Final   Report Status PENDING   Incomplete  Urine Culture     Status: None (Preliminary result)   Collection Time: 01/31/17  3:09 PM  Result Value Ref Range Status   Specimen Description URINE, RANDOM  Final   Special Requests NONE  Final   Culture   Final    CULTURE REINCUBATED FOR BETTER GROWTH Performed at [facility], [place] [address] [place], [place] [address]    Report Status PENDING  Incomplete    IMAGING: Dg Chest 2 View  Result Date: 01/31/2017 CLINICAL DATA:  Fever and chills, recent port placement and biopsy EXAM: CHEST  2 VIEW COMPARISON:  None. FINDINGS: Cardiac shadow is within normal limits. The lungs are well aerated bilaterally. No focal infiltrate or sizable effusion is seen. Right-sided chest wall port is noted in satisfactory position. No acute bony abnormality is noted. IMPRESSION: No active cardiopulmonary disease. Electronically Signed   By: [name] M.D.   On: 01/31/2017 16:26   Nm Pet Image Restag (ps) Skull Base To Thigh  Result Date: 01/22/2017 CLINICAL DATA:  Subsequent treatment strategy for followup of rectal cancer. EXAM: NUCLEAR MEDICINE PET SKULL BASE TO THIGH TECHNIQUE: 13.0 mCi F-18 FDG was injected intravenously. Full-ring PET imaging was performed from the skull base to thigh after the radiotracer. CT data was obtained and used for attenuation correction and anatomic localization. FASTING BLOOD GLUCOSE:  Value: 145 mg/dl COMPARISON:  CT 01/11/2017. FINDINGS: NECK: Low left jugular and supraclavicular hypermetabolic nodes. An index left supraclavicular nodal mass measures 1.7 cm and a S.U.V. max of 10.0 on image 69/series 3. Right-sided carotid atherosclerosis. Right maxillary mucous retention cyst or polyp. CHEST: High left mediastinal node measures 12 mm and a S.U.V. max of 4.6 on image 83/series 3. A right infrahilar node measures a S.U.V. max of 8.0 on image 100/ series 3. Not well visualized on CT. Right retrocrural node measures 8 mm and a S.U.V. max of 6.9 on image 146/series  3. Mild cardiomegaly with multivessel coronary artery atherosclerosis. Pulmonary artery enlargement, 3.7 cm outflow tract. Mild centrilobular emphysema. ABDOMEN/PELVIS: Extensive hypermetabolic retroperitoneal adenopathy. Example left periaortic nodes measure up to 1.5 cm and a S.U.V. max of 16.5 on image 173/series 3. Bilateral pelvic sidewall adenopathy, with a right external iliac node measuring 1.5 cm and a S.U.V. max of 23.2. Bilateral hypermetabolic inguinal nodes, with a index left inguinal node measuring 1.6 cm and a S.U.V. max of 6.2 on image 282/series 3. Heterogeneous soft tissue density about the pelvic cul-de-sac corresponds to hypermetabolism. This measures a S.U.V. max of 13.7, including image 20 of 51/series 3. This presumably represents a combination of prostate and surrounding recurrent tumor. Left adrenal hypermetabolism which is felt to correspond to minimal nodularity. This measures 11 mm and a S.U.V. max of 5.8 on image 155/series 3. Moderate hepatic steatosis. Placement of bilateral nephrostomy catheters. Small volume perinephric hematoma posteriorly on image 173/ series 3. Improved appearance of right greater than left hydronephrosis. Descending colostomy. Air within left renal collecting system and urinary bladder. Thick walled bladder is similar. SKELETON: Degenerative partial fusion of the bilateral sacroiliac joints. IMPRESSION: 1. Hypermetabolism corresponding to heterogeneous soft tissue density in the pelvic cul-de-sac. This likely represents local tumor recurrence, superimposed upon the prostate. 2. Nodal metastasis within the neck, chest, abdomen, and pelvis. 3. Placement of nephrostomy catheters, with improvement in bilateral hydronephrosis. Small volume right perinephric hematoma. 4.  Minimal left adrenal nodularity and hypermetabolism, for which metastatic disease cannot be excluded. 5. Coronary artery atherosclerosis. Aortic Atherosclerosis (ICD10-I70.0). 6. Pulmonary artery  enlargement suggests pulmonary arterial hypertension. 7. Hepatic steatosis. 8. Air in the left renal collecting system is likely due to recent nephrostomy. Air within the bladder could be secondary. Similar thick walled bladder could be radiation induced. Infectious cystitis cannot be excluded. Electronically Signed   By: [name] M.D.   On: 01/22/2017 14:30   [place] Core Biopsy (lymph Nodes)  Addendum Date: 01/24/2017   ADDENDUM REPORT: 01/24/2017 15:34 ADDENDUM: Correction to the sedation medicines:  Fentanyl 25 mcg, Versed 1 mg Electronically Signed   By: [name] M.D.   On: 01/24/2017 15:34   Result Date: 01/24/2017 INDICATION: 66 year old with a history of rectal cancer. Patient now has evidence for diffuse metastatic disease including multiple enlarged lymph nodes. Tissue diagnosis is needed. EXAM: ULTRASOUND GUIDED CORE BIOPSY OF RIGHT INGUINAL LYMPH NODE MEDICATIONS: None. ANESTHESIA/SEDATION: Fentanyl 50 mcg IV; Versed 1 mg IV Moderate Sedation Time:  16 minutes The patient was continuously monitored during the procedure by the interventional radiology nurse under my direct supervision. PROCEDURE: The procedure, risks, benefits, and alternatives were explained to the patient. Questions regarding the procedure were encouraged and answered. The patient understands and consents to the procedure. Ultrasound demonstrated enlarged right inguinal lymph nodes. A slightly irregular hypoechoic lymph node was selected for biopsy. Right groin was prepped with chlorhexidine and sterile field was created. Skin was anesthetized with 1% lidocaine. Using ultrasound guidance, core biopsies were obtained within the right inguinal lymph node. Total of 6 core biopsies were obtained. Five biopsies were placed in formalin and 1 was placed on a Telfa pad. Bandage placed over the puncture site. COMPLICATIONS: None immediate. FINDINGS: Enlarged bilateral inguinal lymph nodes. Core biopsies obtained from a right inguinal  lymph node. No significant bleeding or hematoma formation following the core biopsies. IMPRESSION: Successful ultrasound-guided core biopsies of a right inguinal lymph node. Electronically Signed: By: [name] M.D. On: 01/24/2017 11:24   Ct Hematuria Workup  Result Date: 01/11/2017 CLINICAL DATA:  Followup colon cancer. EXAM: CT ABDOMEN AND PELVIS WITHOUT AND WITH CONTRAST TECHNIQUE: Multidetector CT imaging of the abdomen and pelvis was performed following the standard protocol before and following the bolus administration of intravenous contrast. CONTRAST:  141m ISOVUE-300 IOPAMIDOL (ISOVUE-300) INJECTION 61% COMPARISON:  CT scan 04/27/2016 and 08/24/2014 FINDINGS: Lower chest: The lung bases are clear of acute process. No pleural effusion or pulmonary lesions. The heart is normal in size. No pericardial effusion. Stable coronary artery calcifications. The distal esophagus and aorta are unremarkable. Hepatobiliary: No focal hepatic lesions or intrahepatic biliary dilatation the gallbladder appears normal. No common bile duct dilatation. Pancreas: No mass, inflammation or ductal dilatation. Spleen: Normal size.  No focal lesions. Adrenals/Urinary Tract: The adrenal glands are normal in stable. Bilateral hydroureteronephrosis down to enlarging pelvic mass. The right-sided hydroureteronephrosis is new and the left side is progressive. The bladder contains a Foley catheter. There is diffuse asymmetric bladder wall thickening which appears progressive. The delayed images demonstrate compression but not complete obstruction of the right ureter. Stomach/Bowel: The stomach, duodenum, small bowel and colon are grossly normal without oral contrast. No acute inflammatory changes or obstructive findings. Stable left lower quadrant colostomy. Vascular/Lymphatic: Stable atherosclerotic calcifications involving the aorta and iliac arteries. No aneurysm. New retroperitoneal lymphadenopathy. 15 mm periaortic lymph node on  image number 38. 14 mm right-sided retroperitoneal lymph node posterior to the IVC on  image number 41. Left para-aortic lymph node on image number 50 measures 13.5 mm. A left common iliac vein stent is noted. Reproductive: The prostate gland is grossly normal. The seminal vesicles are difficult to identified. Other: Enlarging presacral soft tissue mass which appears confluent with the base of the bladder. Central fluid collection appears stable. The presacral tumor is extending higher up into the pelvis where it is likely obstructing both ureters. Bilateral inguinal lymphadenopathy is new. Index node on the right side on image number 94 measures 14.5 mm an index node on the left side on image number 102 measures 17 mm. Musculoskeletal: No definite metastatic osseous disease. IMPRESSION: 1. 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 New right-sided hydroureteronephrosis with compression of the distal ureter but no complete obstruction. The left ureter appears completely obstructed. 2. New retroperitoneal lymphadenopathy. 3. No findings for bowel obstruction. Left lower quadrant colostomy is stable. 4. No definite CT findings for hepatic metastatic disease. 5. New bilateral inguinal lymphadenopathy. Electronically Signed   By: Marijo Sanes M.D.   On: 01/11/2017 13:18   Ir Nephrostomy Placement Left  Result Date: 01/19/2017 EXAM: IR NEPHROSTOMY PLACEMENT LEFT; IR NEPHROSTOMY PLACEMENT RIGHT COMPARISON:  None. MEDICATIONS: 2 g IV Ancef; The antibiotic was administered in an appropriate time frame prior to skin puncture. ANESTHESIA/SEDATION: Fentanyl 100 mcg IV; Versed 3.0 mg IV Moderate Sedation Time:  60 minutes. The patient was continuously monitored during the procedure by the interventional radiology nurse under my direct supervision. CONTRAST:  10 mL Isovue-300 - administered into the collecting system(s) FLUOROSCOPY TIME:   Fluoroscopy Time: 1 minute.  (52 mGy). COMPLICATIONS: None immediate. PROCEDURE: Informed written consent was obtained from the patient after a thorough discussion of the procedural risks, benefits and alternatives. All questions were addressed. Maximal Sterile Barrier Technique was utilized including caps, mask, sterile gowns, sterile gloves, sterile drape, hand hygiene and skin antiseptic. A timeout was performed prior to the initiation of the procedure. Both kidneys were localized ultrasound. Under ultrasound guidance, access of the collecting systems was performed with 21 gauge needles. Transitional dilator is replaced. Tracts were dilated over guidewires and 10 French nephrostomy tubes were advanced bilaterally. Both tubes were injected with contrast to confirm position and fluoroscopic images saved. The catheters were connected to gravity bags and secured at the skin with Prolene retention sutures and StatLock devices. FINDINGS: There is significant hydronephrosis on the left. After lower pole access, a nephrostomy tube was advanced into the central collecting system and is draining well after placement. Right-sided hydronephrosis is present, to a lesser degree than on the left. Puncture of the right kidney was more difficult by ultrasound guidance, resulting in a more central puncture of the collecting system. A nephrostomy tube was advanced into the collecting system such that it is partially formed and extends just into the proximal ureter. The tube is draining well after placement. IMPRESSION: Placement of bilateral percutaneous nephrostomy tubes. Bilateral 10 French nephrostomy tubes were placed and attached to gravity bag drainage. Electronically Signed   By: Aletta Edouard M.D.   On: 01/19/2017 15:11   Ir Nephrostomy Placement Right  Result Date: 01/19/2017 EXAM: IR NEPHROSTOMY PLACEMENT LEFT; IR NEPHROSTOMY PLACEMENT RIGHT COMPARISON:  None. MEDICATIONS: 2 g IV Ancef; The antibiotic was  administered in an appropriate time frame prior to skin puncture. ANESTHESIA/SEDATION: Fentanyl 100 mcg IV; Versed 3.0 mg IV Moderate Sedation Time:  60 minutes. The patient was continuously monitored  during the procedure by the interventional radiology nurse under my direct supervision. CONTRAST:  10 mL Isovue-300 - administered into the collecting system(s) FLUOROSCOPY TIME:  Fluoroscopy Time: 1 minute.  (52 mGy). COMPLICATIONS: None immediate. PROCEDURE: Informed written consent was obtained from the patient after a thorough discussion of the procedural risks, benefits and alternatives. All questions were addressed. Maximal Sterile Barrier Technique was utilized including caps, mask, sterile gowns, sterile gloves, sterile drape, hand hygiene and skin antiseptic. A timeout was performed prior to the initiation of the procedure. Both kidneys were localized ultrasound. Under ultrasound guidance, access of the collecting systems was performed with 21 gauge needles. Transitional dilator is replaced. Tracts were dilated over guidewires and 10 French nephrostomy tubes were advanced bilaterally. Both tubes were injected with contrast to confirm position and fluoroscopic images saved. The catheters were connected to gravity bags and secured at the skin with Prolene retention sutures and StatLock devices. FINDINGS: There is significant hydronephrosis on the left. After lower pole access, a nephrostomy tube was advanced into the central collecting system and is draining well after placement. Right-sided hydronephrosis is present, to a lesser degree than on the left. Puncture of the right kidney was more difficult by ultrasound guidance, resulting in a more central puncture of the collecting system. A nephrostomy tube was advanced into the collecting system such that it is partially formed and extends just into the proximal ureter. The tube is draining well after placement. IMPRESSION: Placement of bilateral percutaneous  nephrostomy tubes. Bilateral 10 French nephrostomy tubes were placed and attached to gravity bag drainage. Electronically Signed   By: Aletta Edouard M.D.   On: 01/19/2017 15:11   Ir Fluoro Guide Port Insertion Right  Result Date: 01/24/2017 INDICATION: 66 year old with history of rectal cancer and new lymphadenopathy. Port-A-Cath needed for treatment. EXAM: FLUOROSCOPIC AND ULTRASOUND GUIDED PLACEMENT OF A SUBCUTANEOUS PORT COMPARISON:  None. MEDICATIONS: Ancef 2 g; The antibiotic was administered within an appropriate time interval prior to skin puncture. ANESTHESIA/SEDATION: Versed 2.0 mg IV; Fentanyl 75 mcg IV; Moderate Sedation Time:  30 minutes The patient was continuously monitored during the procedure by the interventional radiology nurse under my direct supervision. FLUOROSCOPY TIME:  30 seconds, 12 mGy COMPLICATIONS: None immediate. PROCEDURE: The procedure, risks, benefits, and alternatives were explained to the patient. Questions regarding the procedure were encouraged and answered. The patient understands and consents to the procedure. Patient was placed supine on the interventional table. Ultrasound confirmed a patent right internal jugular vein. The right chest and neck were cleaned with a skin antiseptic and a sterile drape was placed. Maximal barrier sterile technique was utilized including caps, mask, sterile gowns, sterile gloves, sterile drape, hand hygiene and skin antiseptic. The right neck was anesthetized with 1% lidocaine. Small incision was made in the right neck with a blade. Micropuncture set was placed in the right internal jugular vein with ultrasound guidance. The micropuncture wire was used for measurement purposes. The right chest was anesthetized with 1% lidocaine with epinephrine. #15 blade was used to make an incision and a subcutaneous port pocket was formed. Stanford was assembled. Subcutaneous tunnel was formed with a stiff tunneling device. The port catheter  was brought through the subcutaneous tunnel. The port was placed in the subcutaneous pocket. The micropuncture set was exchanged for a peel-away sheath. The catheter was placed through the peel-away sheath and the tip was positioned at superior cavoatrial junction. Catheter placement was confirmed with fluoroscopy. The port was accessed and flushed with  heparinized saline. The port pocket was closed using two layers of absorbable sutures and Dermabond. The vein skin site was closed using a single layer of absorbable suture and Dermabond. Sterile dressings were applied. Patient tolerated the procedure well without an immediate complication. Ultrasound and fluoroscopic images were taken and saved for this procedure. IMPRESSION: Placement of a subcutaneous port device. Electronically Signed   By: [name] M.D.   On: 01/24/2017 15:30    Assessment:   Donald Townsend is a 66 y.o. male with metastatic cancer admitted with fevers and chills. He has bil nephrostomy tubes and has + UA from the R and L.  He has some mid back pain as well.  Currently on cefepime. WBC down 13-9.   Recommendations Repeat UA and UCx from R Cont cefepime. If worsens or fevers recur would suggest discuss with Urology and consider non contrast CT scan to ru abscess.  Thank you very much for allowing me to participate in the care of this patient. Please call with questions.   Cheral Marker. Ola Spurr, MD

## 2017-02-01 NOTE — Telephone Encounter (Signed)
Donald Retort, MD  Orlie Pollen, Honor Loh, LPN        Patient needs to be seen in 2 months to arrange for IR to exchange blt PCN. He needs no other follow up until then unless he has an acute issue with PCNs (even if hospital says otherwise). Thanks   Appt has been made.

## 2017-02-01 NOTE — Progress Notes (Signed)
Physical Therapy Evaluation Patient Details Name: Donald Townsend MRN: [id number] DOB: 14-Jan-1951 Today's Date: 02/01/2017   History of Present Illness  Pt admitted for sepsis. PMH of colon cancer w/ mets to surrounding lymph nodes, nephrosotomy tubes placed on 01/26/17, colostomy, afib on Eliquis.     Clinical Impression  Pt is a pleasant 66 year old male admitted for sepsis. Pt performs bed mobility with I., transfers with supervision and amb with CGA. Pt amb one lap around nurse's station with hand on IV pole for support, able to amb within room with no assistive device. Assisted pt with toileting tasks, pt able to perform all tasks independently. Nurse and daughter were present in room to help manage nephrostomy tubes prior to amb. Pt appeared more stable when amb with IV pole, recommend use of SPC for all mobility tasks. Pt demonstrates deficits with LE strength/endurance with transfers and amb. Would benefit from skilled PT to address above deficits and promote optimal return to home. Recommend DC to home with outpatient PT services upon DC from acute hospitalization.     Follow Up Recommendations Outpatient PT    Equipment Recommendations  [facility])    Recommendations for Other Services       Precautions / Restrictions Precautions Precautions: Fall Restrictions Weight Bearing Restrictions: No      Mobility  Bed Mobility Overal bed mobility: Independent             General bed mobility comments: Pt able to rise from supine to seated EOB with proper technique  Transfers Overall transfer level: Needs assistance Equipment used: None Transfers: Sit to/from Stand Sit to Stand: Supervision         General transfer comment: Pt able to transfer from seated EOB to standing with proper technique with no assistive device. Appears safe to transfer without RW, however recommend a cane for extra support.  Ambulation/Gait Ambulation/Gait assistance: Min guard Ambulation  Distance (Feet): 230 Feet Assistive device: None (hand on IV pole) Gait Pattern/deviations: Step-through pattern     General Gait Details: Pt amb with alternating step through gait, able to maintain steady pace, no LOB noted. Pt held onto IV pole for extra support, would recommend use of cane for amb.   Stairs            Wheelchair Mobility    Modified Rankin (Stroke Patients Only)       Balance Overall balance assessment: Needs assistance Sitting-balance support: Feet supported Sitting balance-Leahy Scale: Good Sitting balance - Comments: Pt able to sit at EOB and maintain position for several minutes. No assist needed.       Standing balance comment: Pt able to stand at EOB with no assist device, with supervision. Able to maintain position, pt appear safe without assist device, however recommend cane for extra support.                              Pertinent Vitals/Pain Pain Assessment: 0-10 Pain Score: 5  Pain Location: Low Back Pain Intervention(s): Limited activity within patient's tolerance;Monitored during session;Repositioned    Home Living Family/patient expects to be discharged to:: Private residence Living Arrangements: Alone Available Help at Discharge: Family;Friend(s) Type of Home: House Home Access: Stairs to enter Entrance Stairs-Rails: None Entrance Stairs-Number of Steps: 2 Home Layout: One level Home Equipment: None      Prior Function Level of Independence: Independent         Comments: Pt independent  with all ADLs and IADLs, no assistive device use.     Hand Dominance        Extremity/Trunk Assessment   Upper Extremity Assessment Upper Extremity Assessment: Overall WFL for tasks assessed    Lower Extremity Assessment Lower Extremity Assessment: Generalized weakness (Appears to have decreased LE endurance )       Communication   Communication: No difficulties  Cognition Arousal/Alertness: Awake/alert Behavior  During Therapy: WFL for tasks assessed/performed Overall Cognitive Status: Within Functional Limits for tasks assessed                                        General Comments      Exercises Other Exercises Other Exercises: Assisted pt with toileting tasks, CGA for amb to toilet, pt able to perform toileting tasks independently.  Other Exercises: Pt amb with RW for 20 feet, able to maintain steady pace, navigate RW around furniture. CGA for safety.    Assessment/Plan    PT Assessment Patient needs continued PT services  PT Problem List Decreased strength;Decreased activity tolerance;Decreased mobility;Decreased knowledge of use of DME;Pain       PT Treatment Interventions DME instruction;Gait training;Stair training;Functional mobility training;Therapeutic activities;Therapeutic exercise;Balance training;Patient/family education    PT Goals (Current goals can be found in the Care Plan section)  Acute Rehab PT Goals Patient Stated Goal: to get better PT Goal Formulation: With patient Time For Goal Achievement: 02/15/17 Potential to Achieve Goals: Good    Frequency Min 2X/week   Barriers to discharge        Co-evaluation               AM-PAC PT "6 Clicks" Daily Activity  Outcome Measure Difficulty turning over in bed (including adjusting bedclothes, sheets and blankets)?: None Difficulty moving from lying on back to sitting on the side of the bed? : None Difficulty sitting down on and standing up from a chair with arms (e.g., wheelchair, bedside commode, etc,.)?: None Help needed moving to and from a bed to chair (including a wheelchair)?: None Help needed walking in hospital room?: None Help needed climbing 3-5 steps with a railing? : A Little 6 Click Score: 23    End of Session Equipment Utilized During Treatment: Gait belt Activity Tolerance: Patient tolerated treatment well Patient left: in chair;with call bell/phone within reach;with chair  alarm set;with family/visitor present Nurse Communication: Mobility status PT Visit Diagnosis: Other abnormalities of gait and mobility (R26.89);Muscle weakness (generalized) (M62.81);Pain Pain - part of body:  (Low back)    Time: 8115-7262 PT Time Calculation (min) (ACUTE ONLY): 29 min   Charges:   PT Evaluation $PT Eval Low Complexity: 1 Low PT Treatments $Gait Training: 8-22 mins   PT G Codes:   PT G-Codes **NOT FOR INPATIENT CLASS** Functional Assessment Tool Used: AM-PAC 6 Clicks Basic Mobility Functional Limitation: Mobility: Walking and moving around Mobility: Walking and Moving Around Current Status (M3559): At least 1 percent but less than 20 percent impaired, limited or restricted Mobility: Walking and Moving Around Goal Status [phone]): 0 percent impaired, limited or restricted    Manfred Arch, SPT  Manfred Arch 02/01/2017, 5:25 PM

## 2017-02-01 NOTE — Consult Note (Signed)
Pharmacy Antibiotic Note  Donald Townsend is a 66 y.o. male admitted on 01/31/2017 with sepsis and UTI.  Pharmacy has been consulted for cefepime dosing.  Plan: continue cefepime 2g q 12 hours  Height: 5\' 10"  (177.8 cm) Weight: 254 lb (115.2 kg) IBW/kg (Calculated) : 73  Temp (24hrs), Avg:101.3 F (38.5 C), Min:98.9 F (37.2 C), Max:103.2 F (39.6 C)   Recent Labs Lab 01/28/17 1317 01/28/17 1321 01/31/17 1509 01/31/17 1841 02/01/17 0046  WBC  --  13.5* 12.5*  --  9.2  CREATININE 1.76*  --  1.90*  --  1.61*  LATICACIDVEN  --   --  1.6 2.1* 1.0    Estimated Creatinine Clearance: 58.2 mL/min (A) (by C-G formula based on SCr of 1.61 mg/dL (H)).    No Known Allergies  Antimicrobials this admission: cefepime 10/4 >>    Dose adjustments this admission:   Microbiology results: 10/4 BCx: NG <12hr 10/4 UCx: sent    Thank you for allowing pharmacy to be a part of this patient's care.  Ramond Dial, Pharm.D, BCPS Clinical Pharmacist  02/01/2017 1:56 PM

## 2017-02-02 LAB — CBC
HCT: 32.3 % — ABNORMAL LOW (ref 40.0–52.0)
Hemoglobin: 11.3 g/dL — ABNORMAL LOW (ref 13.0–18.0)
MCH: 29.8 pg (ref 26.0–34.0)
MCHC: 35.1 g/dL (ref 32.0–36.0)
MCV: 84.9 fL (ref 80.0–100.0)
PLATELETS: 184 10*3/uL (ref 150–440)
RBC: 3.8 MIL/uL — ABNORMAL LOW (ref 4.40–5.90)
RDW: 12.8 % (ref 11.5–14.5)
WBC: 6.6 10*3/uL (ref 3.8–10.6)

## 2017-02-02 LAB — GLUCOSE, CAPILLARY
GLUCOSE-CAPILLARY: 149 mg/dL — AB (ref 65–99)
GLUCOSE-CAPILLARY: 188 mg/dL — AB (ref 65–99)
GLUCOSE-CAPILLARY: 218 mg/dL — AB (ref 65–99)
Glucose-Capillary: 166 mg/dL — ABNORMAL HIGH (ref 65–99)

## 2017-02-02 LAB — BASIC METABOLIC PANEL
Anion gap: 7 (ref 5–15)
BUN: 22 mg/dL — AB (ref 6–20)
CHLORIDE: 101 mmol/L (ref 101–111)
CO2: 25 mmol/L (ref 22–32)
CREATININE: 1.6 mg/dL — AB (ref 0.61–1.24)
Calcium: 8.1 mg/dL — ABNORMAL LOW (ref 8.9–10.3)
GFR calc Af Amer: 51 mL/min — ABNORMAL LOW (ref >60)
GFR, EST NON AFRICAN AMERICAN: 44 mL/min — AB (ref >60)
GLUCOSE: 170 mg/dL — AB (ref 65–99)
Potassium: 3.8 mmol/L (ref 3.5–5.1)
SODIUM: 133 mmol/L — AB (ref 135–145)

## 2017-02-02 NOTE — Progress Notes (Signed)
Per Chelsea Aus pt ok to take shower.

## 2017-02-02 NOTE — Progress Notes (Signed)
[name] [facility]                                                                                                                                                                                  Patient Demographics   Donald Townsend, is a 66 y.o. male, DOB - June 24, 1950, TAV:[id number]  Admit date - 01/31/2017   Admitting Physician [name], MD  Outpatient Primary MD for the patient is Dion Body, MD   LOS - 2  Subjective: Feeling better wanted to take shower  Review of Systems:   CONSTITUTIONAL: No documented fever. Positivefatigue,  Positive weakness. No weight gain, no weight loss.  EYES: No blurry or double vision.  ENT: No tinnitus. No postnasal drip. No redness of the oropharynx.  RESPIRATORY: No cough, no wheeze, no hemoptysis. No dyspnea.  CARDIOVASCULAR: No chest pain. No orthopnea. No palpitations. No syncope.  GASTROINTESTINAL: No nausea, no vomiting or diarrhea. No abdominal pain. No melena or hematochezia.  GENITOURINARY: No dysuria or hematuria.  ENDOCRINE: No polyuria or nocturia. No heat or cold intolerance.  HEMATOLOGY: No anemia. No bruising. No bleeding.  INTEGUMENTARY: No rashes. No lesions.  MUSCULOSKELETAL: No arthritis. No swelling. No gout.  NEUROLOGIC: No numbness, tingling, or ataxia. No seizure-type activity.  PSYCHIATRIC: No anxiety. No insomnia. No ADD.    Vitals:   Vitals:   02/01/17 1951 02/02/17 0426 02/02/17 0902 02/02/17 1043  BP: (!) 112/54 132/78 118/62   Pulse: 67 87 88 84  Resp: 20 19    Temp: 100.3 F (37.9 C) 99 F (37.2 C) 99 F (37.2 C)   TempSrc: Oral Oral Oral   SpO2: 97% 97% 98%   Weight:      Height:        Wt Readings from Last 3 Encounters:  01/31/17 254 lb (115.2 kg)  01/28/17 253 lb 12.8 oz (115.1 kg)  01/24/17 258 lb (117 kg)     Intake/Output Summary (Last 24 hours) at 02/02/17 1336 Last data filed at 02/02/17 0855  Gross per 24 hour  Intake          2138.25  ml  Output             2225 ml  Net           -86.75 ml    Physical Exam:   GENERAL: Pleasant-appearing in no apparent distress.  HEAD, EYES, EARS, NOSE AND THROAT: Atraumatic, normocephalic. Extraocular muscles are intact. Pupils equal and reactive to light. Sclerae anicteric. No conjunctival injection. No oro-pharyngeal erythema.  NECK: Supple. There is no jugular venous distention. No bruits, no lymphadenopathy, no thyromegaly.  HEART: Regular rate and rhythm,.  No murmurs, no rubs, no clicks.  LUNGS: Clear to auscultation bilaterally. No rales or rhonchi. No wheezes.  ABDOMEN: Soft, flat, nontender, nondistended. Has good bowel sounds. No hepatosplenomegaly appreciated.  EXTREMITIES: No evidence of any cyanosis, clubbing, or peripheral edema.  +2 pedal and radial pulses bilaterally.  NEUROLOGIC: The patient is alert, awake, and oriented x3 with no focal motor or sensory deficits appreciated bilaterally.  SKIN: Moist and warm with no rashes appreciated.  Psych: Not anxious, depressed LN: No inguinal LN enlargement    Antibiotics   Anti-infectives    Start     Dose/Rate Route Frequency Ordered Stop   02/01/17 0600  ceFEPIme (MAXIPIME) 2 g in dextrose 5 % 50 mL IVPB     2 g 100 mL/hr over 30 Minutes Intravenous Every 12 hours 01/31/17 1900     01/31/17 1800  ceFEPIme (MAXIPIME) 2 g in dextrose 5 % 50 mL IVPB  Status:  Discontinued     2 g 100 mL/hr over 30 Minutes Intravenous Every 12 hours 01/31/17 1732 01/31/17 1859   01/31/17 1700  ceFEPIme (MAXIPIME) 2 g in dextrose 5 % 50 mL IVPB     2 g 100 mL/hr over 30 Minutes Intravenous  Once 01/31/17 1645 02/01/17 1659      Medications   Scheduled Meds: . apixaban  5 mg Oral Q12H  . aspirin EC  81 mg Oral Daily  . digoxin  0.25 mg Oral Daily  . furosemide  40 mg Oral Daily  . insulin aspart  0-9 Units Subcutaneous TID WC  . insulin aspart  3 Units Subcutaneous TID WC  . insulin glargine  10 Units Subcutaneous QHS  . losartan   50 mg Oral Daily  . metoprolol tartrate  25 mg Oral BID  . pravastatin  20 mg Oral Daily  . tamsulosin  0.4 mg Oral Daily   Continuous Infusions: . sodium chloride 75 mL/hr (02/02/17 0959)  . ceFEPime (MAXIPIME) IV Stopped (02/01/17 1914)   PRN Meds:.acetaminophen, docusate sodium, HYDROcodone-acetaminophen   Data Review:   Micro Results Recent Results (from the past 240 hour(s))  Culture, blood (Routine x 2)     Status: None (Preliminary result)   Collection Time: 01/31/17  3:09 PM  Result Value Ref Range Status   Specimen Description BLOOD BLOOD LEFT HAND  Final   Special Requests   Final    BOTTLES DRAWN AEROBIC AND ANAEROBIC Blood Culture adequate volume   Culture NO GROWTH 2 DAYS  Final   Report Status PENDING  Incomplete  Culture, blood (Routine x 2)     Status: None (Preliminary result)   Collection Time: 01/31/17  3:09 PM  Result Value Ref Range Status   Specimen Description BLOOD BLOOD RIGHT ARM  Final   Special Requests   Final    BOTTLES DRAWN AEROBIC AND ANAEROBIC Blood Culture adequate volume   Culture NO GROWTH 2 DAYS  Final   Report Status PENDING  Incomplete  Urine Culture     Status: Abnormal (Preliminary result)   Collection Time: 01/31/17  3:09 PM  Result Value Ref Range Status   Specimen Description URINE, RANDOM  Final   Special Requests NONE  Final   Culture >=100,000 COLONIES/mL KLEBSIELLA OXYTOCA (A)  Final   Report Status PENDING  Incomplete    Radiology Reports Dg Chest 2 View  Result Date: 01/31/2017 CLINICAL DATA:  Fever and chills, recent port placement and biopsy EXAM: CHEST  2 VIEW COMPARISON:  None. FINDINGS: Cardiac shadow is  within normal limits. The lungs are well aerated bilaterally. No focal infiltrate or sizable effusion is seen. Right-sided chest wall port is noted in satisfactory position. No acute bony abnormality is noted. IMPRESSION: No active cardiopulmonary disease. Electronically Signed   By: [name] M.D.   On: 01/31/2017  16:26   Nm Pet Image Restag (ps) Skull Base To Thigh  Result Date: 01/22/2017 CLINICAL DATA:  Subsequent treatment strategy for followup of rectal cancer. EXAM: NUCLEAR MEDICINE PET SKULL BASE TO THIGH TECHNIQUE: 13.0 mCi F-18 FDG was injected intravenously. Full-ring PET imaging was performed from the skull base to thigh after the radiotracer. CT data was obtained and used for attenuation correction and anatomic localization. FASTING BLOOD GLUCOSE:  Value: 145 mg/dl COMPARISON:  CT 01/11/2017. FINDINGS: NECK: Low left jugular and supraclavicular hypermetabolic nodes. An index left supraclavicular nodal mass measures 1.7 cm and a S.U.V. max of 10.0 on image 69/series 3. Right-sided carotid atherosclerosis. Right maxillary mucous retention cyst or polyp. CHEST: High left mediastinal node measures 12 mm and a S.U.V. max of 4.6 on image 83/series 3. A right infrahilar node measures a S.U.V. max of 8.0 on image 100/ series 3. Not well visualized on CT. Right retrocrural node measures 8 mm and a S.U.V. max of 6.9 on image 146/series 3. Mild cardiomegaly with multivessel coronary artery atherosclerosis. Pulmonary artery enlargement, 3.7 cm outflow tract. Mild centrilobular emphysema. ABDOMEN/PELVIS: Extensive hypermetabolic retroperitoneal adenopathy. Example left periaortic nodes measure up to 1.5 cm and a S.U.V. max of 16.5 on image 173/series 3. Bilateral pelvic sidewall adenopathy, with a right external iliac node measuring 1.5 cm and a S.U.V. max of 23.2. Bilateral hypermetabolic inguinal nodes, with a index left inguinal node measuring 1.6 cm and a S.U.V. max of 6.2 on image 282/series 3. Heterogeneous soft tissue density about the pelvic cul-de-sac corresponds to hypermetabolism. This measures a S.U.V. max of 13.7, including image 20 of 51/series 3. This presumably represents a combination of prostate and surrounding recurrent tumor. Left adrenal hypermetabolism which is felt to correspond to minimal  nodularity. This measures 11 mm and a S.U.V. max of 5.8 on image 155/series 3. Moderate hepatic steatosis. Placement of bilateral nephrostomy catheters. Small volume perinephric hematoma posteriorly on image 173/ series 3. Improved appearance of right greater than left hydronephrosis. Descending colostomy. Air within left renal collecting system and urinary bladder. Thick walled bladder is similar. SKELETON: Degenerative partial fusion of the bilateral sacroiliac joints. IMPRESSION: 1. Hypermetabolism corresponding to heterogeneous soft tissue density in the pelvic cul-de-sac. This likely represents local tumor recurrence, superimposed upon the prostate. 2. Nodal metastasis within the neck, chest, abdomen, and pelvis. 3. Placement of nephrostomy catheters, with improvement in bilateral hydronephrosis. Small volume right perinephric hematoma. 4. Minimal left adrenal nodularity and hypermetabolism, for which metastatic disease cannot be excluded. 5. Coronary artery atherosclerosis. Aortic Atherosclerosis (ICD10-I70.0). 6. Pulmonary artery enlargement suggests pulmonary arterial hypertension. 7. Hepatic steatosis. 8. Air in the left renal collecting system is likely due to recent nephrostomy. Air within the bladder could be secondary. Similar thick walled bladder could be radiation induced. Infectious cystitis cannot be excluded. Electronically Signed   By: [name] M.D.   On: 01/22/2017 14:30   [place] Core Biopsy (lymph Nodes)  Addendum Date: 01/24/2017   ADDENDUM REPORT: 01/24/2017 15:34 ADDENDUM: Correction to the sedation medicines:  Fentanyl 25 mcg, Versed 1 mg Electronically Signed   By: [name] M.D.   On: 01/24/2017 15:34   Result Date: 01/24/2017 INDICATION: 66 year old with a history  of rectal cancer. Patient now has evidence for diffuse metastatic disease including multiple enlarged lymph nodes. Tissue diagnosis is needed. EXAM: ULTRASOUND GUIDED CORE BIOPSY OF RIGHT INGUINAL LYMPH NODE MEDICATIONS:  None. ANESTHESIA/SEDATION: Fentanyl 50 mcg IV; Versed 1 mg IV Moderate Sedation Time:  16 minutes The patient was continuously monitored during the procedure by the interventional radiology nurse under my direct supervision. PROCEDURE: The procedure, risks, benefits, and alternatives were explained to the patient. Questions regarding the procedure were encouraged and answered. The patient understands and consents to the procedure. Ultrasound demonstrated enlarged right inguinal lymph nodes. A slightly irregular hypoechoic lymph node was selected for biopsy. Right groin was prepped with chlorhexidine and sterile field was created. Skin was anesthetized with 1% lidocaine. Using ultrasound guidance, core biopsies were obtained within the right inguinal lymph node. Total of 6 core biopsies were obtained. Five biopsies were placed in formalin and 1 was placed on a Telfa pad. Bandage placed over the puncture site. COMPLICATIONS: None immediate. FINDINGS: Enlarged bilateral inguinal lymph nodes. Core biopsies obtained from a right inguinal lymph node. No significant bleeding or hematoma formation following the core biopsies. IMPRESSION: Successful ultrasound-guided core biopsies of a right inguinal lymph node. Electronically Signed: By: [name] M.D. On: 01/24/2017 11:24   Ct Hematuria Workup  Result Date: 01/11/2017 CLINICAL DATA:  Followup colon cancer. EXAM: CT ABDOMEN AND PELVIS WITHOUT AND WITH CONTRAST TECHNIQUE: Multidetector CT imaging of the abdomen and pelvis was performed following the standard protocol before and following the bolus administration of intravenous contrast. CONTRAST:  168mL ISOVUE-300 IOPAMIDOL (ISOVUE-300) INJECTION 61% COMPARISON:  CT scan 04/27/2016 and 08/24/2014 FINDINGS: Lower chest: The lung bases are clear of acute process. No pleural effusion or pulmonary lesions. The heart is normal in size. No pericardial effusion. Stable coronary artery calcifications. The distal esophagus and  aorta are unremarkable. Hepatobiliary: No focal hepatic lesions or intrahepatic biliary dilatation the gallbladder appears normal. No common bile duct dilatation. Pancreas: No mass, inflammation or ductal dilatation. Spleen: Normal size.  No focal lesions. Adrenals/Urinary Tract: The adrenal glands are normal in stable. Bilateral hydroureteronephrosis down to enlarging pelvic mass. The right-sided hydroureteronephrosis is new and the left side is progressive. The bladder contains a Foley catheter. There is diffuse asymmetric bladder wall thickening which appears progressive. The delayed images demonstrate compression but not complete obstruction of the right ureter. Stomach/Bowel: The stomach, duodenum, small bowel and colon are grossly normal without oral contrast. No acute inflammatory changes or obstructive findings. Stable left lower quadrant colostomy. Vascular/Lymphatic: Stable atherosclerotic calcifications involving the aorta and iliac arteries. No aneurysm. New retroperitoneal lymphadenopathy. 15 mm periaortic lymph node on image number 38. 14 mm right-sided retroperitoneal lymph node posterior to the IVC on image number 41. Left para-aortic lymph node on image number 50 measures 13.5 mm. A left common iliac vein stent is noted. Reproductive: The prostate gland is grossly normal. The seminal vesicles are difficult to identified. Other: Enlarging presacral soft tissue mass which appears confluent with the base of the bladder. Central fluid collection appears stable. The presacral tumor is extending higher up into the pelvis where it is likely obstructing both ureters. Bilateral inguinal lymphadenopathy is new. Index node on the right side on image number 94 measures 14.5 mm an index node on the left side on image number 102 measures 17 mm. Musculoskeletal: No definite metastatic osseous disease. IMPRESSION: 1. Progressive presacral tumor which appears confluent with the base of the bladder and is also now  obstructing both ureters. New  right-sided hydroureteronephrosis with compression of the distal ureter but no complete obstruction. The left ureter appears completely obstructed. 2. New retroperitoneal lymphadenopathy. 3. No findings for bowel obstruction. Left lower quadrant colostomy is stable. 4. No definite CT findings for hepatic metastatic disease. 5. New bilateral inguinal lymphadenopathy. Electronically Signed   By: Marijo Sanes M.D.   On: 01/11/2017 13:18   Ir Nephrostomy Placement Left  Result Date: 01/19/2017 EXAM: IR NEPHROSTOMY PLACEMENT LEFT; IR NEPHROSTOMY PLACEMENT RIGHT COMPARISON:  None. MEDICATIONS: 2 g IV Ancef; The antibiotic was administered in an appropriate time frame prior to skin puncture. ANESTHESIA/SEDATION: Fentanyl 100 mcg IV; Versed 3.0 mg IV Moderate Sedation Time:  60 minutes. The patient was continuously monitored during the procedure by the interventional radiology nurse under my direct supervision. CONTRAST:  10 mL Isovue-300 - administered into the collecting system(s) FLUOROSCOPY TIME:  Fluoroscopy Time: 1 minute.  (52 mGy). COMPLICATIONS: None immediate. PROCEDURE: Informed written consent was obtained from the patient after a thorough discussion of the procedural risks, benefits and alternatives. All questions were addressed. Maximal Sterile Barrier Technique was utilized including caps, mask, sterile gowns, sterile gloves, sterile drape, hand hygiene and skin antiseptic. A timeout was performed prior to the initiation of the procedure. Both kidneys were localized ultrasound. Under ultrasound guidance, access of the collecting systems was performed with 21 gauge needles. Transitional dilator is replaced. Tracts were dilated over guidewires and 10 French nephrostomy tubes were advanced bilaterally. Both tubes were injected with contrast to confirm position and fluoroscopic images saved. The catheters were connected to gravity bags and secured at the skin with Prolene  retention sutures and StatLock devices. FINDINGS: There is significant hydronephrosis on the left. After lower pole access, a nephrostomy tube was advanced into the central collecting system and is draining well after placement. Right-sided hydronephrosis is present, to a lesser degree than on the left. Puncture of the right kidney was more difficult by ultrasound guidance, resulting in a more central puncture of the collecting system. A nephrostomy tube was advanced into the collecting system such that it is partially formed and extends just into the proximal ureter. The tube is draining well after placement. IMPRESSION: Placement of bilateral percutaneous nephrostomy tubes. Bilateral 10 French nephrostomy tubes were placed and attached to gravity bag drainage. Electronically Signed   By: Aletta Edouard M.D.   On: 01/19/2017 15:11   Ir Nephrostomy Placement Right  Result Date: 01/19/2017 EXAM: IR NEPHROSTOMY PLACEMENT LEFT; IR NEPHROSTOMY PLACEMENT RIGHT COMPARISON:  None. MEDICATIONS: 2 g IV Ancef; The antibiotic was administered in an appropriate time frame prior to skin puncture. ANESTHESIA/SEDATION: Fentanyl 100 mcg IV; Versed 3.0 mg IV Moderate Sedation Time:  60 minutes. The patient was continuously monitored during the procedure by the interventional radiology nurse under my direct supervision. CONTRAST:  10 mL Isovue-300 - administered into the collecting system(s) FLUOROSCOPY TIME:  Fluoroscopy Time: 1 minute.  (52 mGy). COMPLICATIONS: None immediate. PROCEDURE: Informed written consent was obtained from the patient after a thorough discussion of the procedural risks, benefits and alternatives. All questions were addressed. Maximal Sterile Barrier Technique was utilized including caps, mask, sterile gowns, sterile gloves, sterile drape, hand hygiene and skin antiseptic. A timeout was performed prior to the initiation of the procedure. Both kidneys were localized ultrasound. Under ultrasound guidance,  access of the collecting systems was performed with 21 gauge needles. Transitional dilator is replaced. Tracts were dilated over guidewires and 10 French nephrostomy tubes were advanced bilaterally. Both tubes were injected with contrast  to confirm position and fluoroscopic images saved. The catheters were connected to gravity bags and secured at the skin with Prolene retention sutures and StatLock devices. FINDINGS: There is significant hydronephrosis on the left. After lower pole access, a nephrostomy tube was advanced into the central collecting system and is draining well after placement. Right-sided hydronephrosis is present, to a lesser degree than on the left. Puncture of the right kidney was more difficult by ultrasound guidance, resulting in a more central puncture of the collecting system. A nephrostomy tube was advanced into the collecting system such that it is partially formed and extends just into the proximal ureter. The tube is draining well after placement. IMPRESSION: Placement of bilateral percutaneous nephrostomy tubes. Bilateral 10 French nephrostomy tubes were placed and attached to gravity bag drainage. Electronically Signed   By: Aletta Edouard M.D.   On: 01/19/2017 15:11   Ir Fluoro Guide Port Insertion Right  Result Date: 01/24/2017 INDICATION: 66 year old with history of rectal cancer and new lymphadenopathy. Port-A-Cath needed for treatment. EXAM: FLUOROSCOPIC AND ULTRASOUND GUIDED PLACEMENT OF A SUBCUTANEOUS PORT COMPARISON:  None. MEDICATIONS: Ancef 2 g; The antibiotic was administered within an appropriate time interval prior to skin puncture. ANESTHESIA/SEDATION: Versed 2.0 mg IV; Fentanyl 75 mcg IV; Moderate Sedation Time:  30 minutes The patient was continuously monitored during the procedure by the interventional radiology nurse under my direct supervision. FLUOROSCOPY TIME:  30 seconds, 12 mGy COMPLICATIONS: None immediate. PROCEDURE: The procedure, risks, benefits, and  alternatives were explained to the patient. Questions regarding the procedure were encouraged and answered. The patient understands and consents to the procedure. Patient was placed supine on the interventional table. Ultrasound confirmed a patent right internal jugular vein. The right chest and neck were cleaned with a skin antiseptic and a sterile drape was placed. Maximal barrier sterile technique was utilized including caps, mask, sterile gowns, sterile gloves, sterile drape, hand hygiene and skin antiseptic. The right neck was anesthetized with 1% lidocaine. Small incision was made in the right neck with a blade. Micropuncture set was placed in the right internal jugular vein with ultrasound guidance. The micropuncture wire was used for measurement purposes. The right chest was anesthetized with 1% lidocaine with epinephrine. #15 blade was used to make an incision and a subcutaneous port pocket was formed. Simpson was assembled. Subcutaneous tunnel was formed with a stiff tunneling device. The port catheter was brought through the subcutaneous tunnel. The port was placed in the subcutaneous pocket. The micropuncture set was exchanged for a peel-away sheath. The catheter was placed through the peel-away sheath and the tip was positioned at superior cavoatrial junction. Catheter placement was confirmed with fluoroscopy. The port was accessed and flushed with heparinized saline. The port pocket was closed using two layers of absorbable sutures and Dermabond. The vein skin site was closed using a single layer of absorbable suture and Dermabond. Sterile dressings were applied. Patient tolerated the procedure well without an immediate complication. Ultrasound and fluoroscopic images were taken and saved for this procedure. IMPRESSION: Placement of a subcutaneous port device. Electronically Signed   By: [name] M.D.   On: 01/24/2017 15:30     CBC  Recent Labs Lab 01/28/17 1321 01/31/17 1509  02/01/17 0046 02/02/17 0401  WBC 13.5* 12.5* 9.2 6.6  HGB 12.2* 11.9* 11.2* 11.3*  HCT 35.7* 34.9* 31.7* 32.3*  PLT 244 253 216 184  MCV 86.1 84.9 84.9 84.9  MCH 29.4 28.9 30.0 29.8  MCHC 34.2 34.0 35.3  35.1  RDW 13.4 13.6 13.2 12.8  LYMPHSABS 1.2 0.7*  --   --   MONOABS 0.9 1.1*  --   --   EOSABS 1.1* 0.4  --   --   BASOSABS 0.1 0.1  --   --     Chemistries   Recent Labs Lab 01/28/17 1317 01/31/17 1509 02/01/17 0046 02/02/17 0401  NA 136 132* 132* 133*  K 4.5 4.3 3.9 3.8  CL 104 99* 102 101  CO2 22 22 24 25   GLUCOSE 181* 172* 124* 170*  BUN 21* 29* 24* 22*  CREATININE 1.76* 1.90* 1.61* 1.60*  CALCIUM 8.8* 9.5 8.3* 8.1*  AST 25 40  --   --   ALT 26 36  --   --   ALKPHOS 80 95  --   --   BILITOT 0.8 0.7  --   --    ------------------------------------------------------------------------------------------------------------------ estimated creatinine clearance is 58.5 mL/min (A) (by C-G formula based on SCr of 1.6 mg/dL (H)). ------------------------------------------------------------------------------------------------------------------ No results for input(s): HGBA1C in the last 72 hours. ------------------------------------------------------------------------------------------------------------------ No results for input(s): CHOL, HDL, LDLCALC, TRIG, CHOLHDL, LDLDIRECT in the last 72 hours. ------------------------------------------------------------------------------------------------------------------ No results for input(s): TSH, T4TOTAL, T3FREE, THYROIDAB in the last 72 hours.  Invalid input(s): FREET3 ------------------------------------------------------------------------------------------------------------------ No results for input(s): VITAMINB12, FOLATE, FERRITIN, TIBC, IRON, RETICCTPCT in the last 72 hours.  Coagulation profile  Recent Labs Lab 01/31/17 1509  INR 1.31    No results for input(s): DDIMER in the last 72 hours.  Cardiac Enzymes No  results for input(s): CKMB, TROPONINI, MYOGLOBIN in the last 168 hours.  Invalid input(s): CK ------------------------------------------------------------------------------------------------------------------ Invalid input(s): [place]   Galen Russman a 66 y.o. malewith a known history of Colon cancer, diabetes with this, congestive heart failure, atrial fibrillation Who was recently hospitalized for bilateral nephrostomy tube recently discharge comes back with fevers   #Sepsis due to UTI- urine cx gram negative rods await final ID Continue IV ceftriaxone seen by urology recommended to continue nephrostomy care  #Chronic atrial fibrillation Rate controlled continue Toprol Continue eliquis  # hypertension Continue home medication Toprol and hold Cozaar    # colonCancer with recurrence Status post colectomy Has been seen by oncology planned for chemotherapy in the future  #CHF not fluid overloaded continue Lasix  #Diabetes mellitus continue sliding scale and Lantus  #Generalized weakness obtain PT evaluation      Code Status Orders        Start     Ordered   01/31/17 1955  Full code  Continuous     01/31/17 1954    Code Status History    Date Active Date Inactive Code Status Order ID Comments User Context   01/18/2017  6:40 PM 01/21/2017  5:08 PM Full Code [id number]  Nicholes Mango, MD Inpatient   05/08/2016  6:37 PM 05/09/2016  7:34 PM Full Code [id number]  Schnier, Dolores Lory, MD Inpatient    Advance Directive Documentation     Most Recent Value  Type of Advance Directive  Healthcare Power of Attorney, Living will  Pre-existing out of facility DNR order (yellow form or pink MOST form)  -  "MOST" Form in Place?  -           Consults none   DVT Prophylaxis  eliquis  Lab Results  Component Value Date   PLT 184 02/02/2017     Time Spent in minutes 53min Greater than 50% of time spent in care coordination and  counseling patient  regarding the condition and plan of care.   Dustin Flock M.D on 02/02/2017 at 1:36 PM  Between 7am to 6pm - Pager - [phone]  After 6pm go to [email or web address] - password [email or web address] [facility]   Office  [phone]

## 2017-02-03 LAB — URINE CULTURE
Culture: >=100000 — AB
Culture: NO GROWTH

## 2017-02-03 LAB — GLUCOSE, CAPILLARY: Glucose-Capillary: 149 mg/dL — ABNORMAL HIGH (ref 65–99)

## 2017-02-03 MED ORDER — CEFDINIR 300 MG PO CAPS: 300.0000 mg | ORAL_CAPSULE | Freq: Two times a day (BID) | ORAL | 0 refills | Status: AC

## 2017-02-03 MED ORDER — ZOLPIDEM TARTRATE 5 MG PO TABS
5.0000 mg | ORAL_TABLET | Freq: Once | ORAL | Status: AC
  Administered 2017-02-03: 5 mg via ORAL
  Filled 2017-02-03: qty 1

## 2017-02-03 NOTE — Care Management Note (Signed)
Case Management Note  Patient Details  Name: Jeromie Gainor MRN: [id number] Date of Birth: 11-Feb-1951  Subjective/Objective:     Mr Heacock to call either his PCP or the [facility] [phone] on Monday to request an appointment for OP-PT.                Action/Plan:   Expected Discharge Date:  02/03/17               Expected Discharge Plan:     In-House Referral:     Discharge planning Services     Post Acute Care Choice:    Choice offered to:     DME Arranged:    DME Agency:     HH Arranged:    HH Agency:     Status of Service:     If discussed at [employer], dates discussed:    Additional Comments:  Juniper Snyders A, RN 02/03/2017, 10:43 AM

## 2017-02-03 NOTE — Progress Notes (Signed)
Patient left without being seen due to pain after biopsy.

## 2017-02-03 NOTE — Discharge Summary (Signed)
Donald Townsend at [facility], 66 y.o., DOB 11-Oct-1950, MRN [id number]. Admission date: 01/31/2017 Discharge Date 02/03/2017 Primary MD Dion Body, MD Admitting Physician [name], MD  Admission Diagnosis  Sepsis, due to unspecified organism [facility]) [A41.9] Urinary tract infection without hematuria, site unspecified [N39.0]  Discharge Diagnosis   Principal Problem:  sepsis due to klebseilla UTI    chronic atrial fibrillation  essential hypertension Colon cancer with recurrence    Hospital Course: Patient is a 66 year old male with known history of colon cancer recently had bilateral nephrostomy tubes who was admitted with fever and tachycardia and signs of sepsis. He again was noted to have a urinary tract infection and admitted to the hospital treated with iv antibiotics. He was also seen in consultation by infectious disease. Patient will be continued on oral antibiotics.  Urine cx showed Klebsiella. Patient responded to antibiotic fevers resolved. He is currently stable for discharge.         Consults  ID  Significant Tests:  See full reports for all details     Dg Chest 2 View  Result Date: 01/31/2017 CLINICAL DATA:  Fever and chills, recent port placement and biopsy EXAM: CHEST  2 VIEW COMPARISON:  None. FINDINGS: Cardiac shadow is within normal limits. The lungs are well aerated bilaterally. No focal infiltrate or sizable effusion is seen. Right-sided chest wall port is noted in satisfactory position. No acute bony abnormality is noted. IMPRESSION: No active cardiopulmonary disease. Electronically Signed   By: [name] M.D.   On: 01/31/2017 16:26   Nm Pet Image Restag (ps) Skull Base To Thigh  Result Date: 01/22/2017 CLINICAL DATA:  Subsequent treatment strategy for followup of rectal cancer. EXAM: NUCLEAR MEDICINE PET SKULL BASE TO THIGH TECHNIQUE: 13.0 mCi F-18 FDG was injected intravenously. Full-ring PET  imaging was performed from the skull base to thigh after the radiotracer. CT data was obtained and used for attenuation correction and anatomic localization. FASTING BLOOD GLUCOSE:  Value: 145 mg/dl COMPARISON:  CT 01/11/2017. FINDINGS: NECK: Low left jugular and supraclavicular hypermetabolic nodes. An index left supraclavicular nodal mass measures 1.7 cm and a S.U.V. max of 10.0 on image 69/series 3. Right-sided carotid atherosclerosis. Right maxillary mucous retention cyst or polyp. CHEST: High left mediastinal node measures 12 mm and a S.U.V. max of 4.6 on image 83/series 3. A right infrahilar node measures a S.U.V. max of 8.0 on image 100/ series 3. Not well visualized on CT. Right retrocrural node measures 8 mm and a S.U.V. max of 6.9 on image 146/series 3. Mild cardiomegaly with multivessel coronary artery atherosclerosis. Pulmonary artery enlargement, 3.7 cm outflow tract. Mild centrilobular emphysema. ABDOMEN/PELVIS: Extensive hypermetabolic retroperitoneal adenopathy. Example left periaortic nodes measure up to 1.5 cm and a S.U.V. max of 16.5 on image 173/series 3. Bilateral pelvic sidewall adenopathy, with a right external iliac node measuring 1.5 cm and a S.U.V. max of 23.2. Bilateral hypermetabolic inguinal nodes, with a index left inguinal node measuring 1.6 cm and a S.U.V. max of 6.2 on image 282/series 3. Heterogeneous soft tissue density about the pelvic cul-de-sac corresponds to hypermetabolism. This measures a S.U.V. max of 13.7, including image 20 of 51/series 3. This presumably represents a combination of prostate and surrounding recurrent tumor. Left adrenal hypermetabolism which is felt to correspond to minimal nodularity. This measures 11 mm and a S.U.V. max of 5.8 on image 155/series 3. Moderate hepatic steatosis. Placement of bilateral nephrostomy catheters. Small volume perinephric hematoma posteriorly on image  173/ series 3. Improved appearance of right greater than left hydronephrosis.  Descending colostomy. Air within left renal collecting system and urinary bladder. Thick walled bladder is similar. SKELETON: Degenerative partial fusion of the bilateral sacroiliac joints. IMPRESSION: 1. Hypermetabolism corresponding to heterogeneous soft tissue density in the pelvic cul-de-sac. This likely represents local tumor recurrence, superimposed upon the prostate. 2. Nodal metastasis within the neck, chest, abdomen, and pelvis. 3. Placement of nephrostomy catheters, with improvement in bilateral hydronephrosis. Small volume right perinephric hematoma. 4. Minimal left adrenal nodularity and hypermetabolism, for which metastatic disease cannot be excluded. 5. Coronary artery atherosclerosis. Aortic Atherosclerosis (ICD10-I70.0). 6. Pulmonary artery enlargement suggests pulmonary arterial hypertension. 7. Hepatic steatosis. 8. Air in the left renal collecting system is likely due to recent nephrostomy. Air within the bladder could be secondary. Similar thick walled bladder could be radiation induced. Infectious cystitis cannot be excluded. Electronically Signed   By: [name] M.D.   On: 01/22/2017 14:30   [place] Core Biopsy (lymph Nodes)  Addendum Date: 01/24/2017   ADDENDUM REPORT: 01/24/2017 15:34 ADDENDUM: Correction to the sedation medicines:  Fentanyl 25 mcg, Versed 1 mg Electronically Signed   By: [name] M.D.   On: 01/24/2017 15:34   Result Date: 01/24/2017 INDICATION: 66 year old with a history of rectal cancer. Patient now has evidence for diffuse metastatic disease including multiple enlarged lymph nodes. Tissue diagnosis is needed. EXAM: ULTRASOUND GUIDED CORE BIOPSY OF RIGHT INGUINAL LYMPH NODE MEDICATIONS: None. ANESTHESIA/SEDATION: Fentanyl 50 mcg IV; Versed 1 mg IV Moderate Sedation Time:  16 minutes The patient was continuously monitored during the procedure by the interventional radiology nurse under my direct supervision. PROCEDURE: The procedure, risks, benefits, and alternatives  were explained to the patient. Questions regarding the procedure were encouraged and answered. The patient understands and consents to the procedure. Ultrasound demonstrated enlarged right inguinal lymph nodes. A slightly irregular hypoechoic lymph node was selected for biopsy. Right groin was prepped with chlorhexidine and sterile field was created. Skin was anesthetized with 1% lidocaine. Using ultrasound guidance, core biopsies were obtained within the right inguinal lymph node. Total of 6 core biopsies were obtained. Five biopsies were placed in formalin and 1 was placed on a Telfa pad. Bandage placed over the puncture site. COMPLICATIONS: None immediate. FINDINGS: Enlarged bilateral inguinal lymph nodes. Core biopsies obtained from a right inguinal lymph node. No significant bleeding or hematoma formation following the core biopsies. IMPRESSION: Successful ultrasound-guided core biopsies of a right inguinal lymph node. Electronically Signed: By: [name] M.D. On: 01/24/2017 11:24   Ct Hematuria Workup  Result Date: 01/11/2017 CLINICAL DATA:  Followup colon cancer. EXAM: CT ABDOMEN AND PELVIS WITHOUT AND WITH CONTRAST TECHNIQUE: Multidetector CT imaging of the abdomen and pelvis was performed following the standard protocol before and following the bolus administration of intravenous contrast. CONTRAST:  157mL ISOVUE-300 IOPAMIDOL (ISOVUE-300) INJECTION 61% COMPARISON:  CT scan 04/27/2016 and 08/24/2014 FINDINGS: Lower chest: The lung bases are clear of acute process. No pleural effusion or pulmonary lesions. The heart is normal in size. No pericardial effusion. Stable coronary artery calcifications. The distal esophagus and aorta are unremarkable. Hepatobiliary: No focal hepatic lesions or intrahepatic biliary dilatation the gallbladder appears normal. No common bile duct dilatation. Pancreas: No mass, inflammation or ductal dilatation. Spleen: Normal size.  No focal lesions. Adrenals/Urinary Tract: The  adrenal glands are normal in stable. Bilateral hydroureteronephrosis down to enlarging pelvic mass. The right-sided hydroureteronephrosis is new and the left side is progressive. The bladder contains a Foley  catheter. There is diffuse asymmetric bladder wall thickening which appears progressive. The delayed images demonstrate compression but not complete obstruction of the right ureter. Stomach/Bowel: The stomach, duodenum, small bowel and colon are grossly normal without oral contrast. No acute inflammatory changes or obstructive findings. Stable left lower quadrant colostomy. Vascular/Lymphatic: Stable atherosclerotic calcifications involving the aorta and iliac arteries. No aneurysm. New retroperitoneal lymphadenopathy. 15 mm periaortic lymph node on image number 38. 14 mm right-sided retroperitoneal lymph node posterior to the IVC on image number 41. Left para-aortic lymph node on image number 50 measures 13.5 mm. A left common iliac vein stent is noted. Reproductive: The prostate gland is grossly normal. The seminal vesicles are difficult to identified. Other: Enlarging presacral soft tissue mass which appears confluent with the base of the bladder. Central fluid collection appears stable. The presacral tumor is extending higher up into the pelvis where it is likely obstructing both ureters. Bilateral inguinal lymphadenopathy is new. Index node on the right side on image number 94 measures 14.5 mm an index node on the left side on image number 102 measures 17 mm. Musculoskeletal: No definite metastatic osseous disease. IMPRESSION: 1. 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 New right-sided hydroureteronephrosis with compression of the distal ureter but no complete obstruction. The left ureter appears completely obstructed. 2. New retroperitoneal lymphadenopathy. 3. No findings for bowel obstruction. Left lower quadrant colostomy  is stable. 4. No definite CT findings for hepatic metastatic disease. 5. New bilateral inguinal lymphadenopathy. Electronically Signed   By: Marijo Sanes M.D.   On: 01/11/2017 13:18   Ir Nephrostomy Placement Left  Result Date: 01/19/2017 EXAM: IR NEPHROSTOMY PLACEMENT LEFT; IR NEPHROSTOMY PLACEMENT RIGHT COMPARISON:  None. MEDICATIONS: 2 g IV Ancef; The antibiotic was administered in an appropriate time frame prior to skin puncture. ANESTHESIA/SEDATION: Fentanyl 100 mcg IV; Versed 3.0 mg IV Moderate Sedation Time:  60 minutes. The patient was continuously monitored during the procedure by the interventional radiology nurse under my direct supervision. CONTRAST:  10 mL Isovue-300 - administered into the collecting system(s) FLUOROSCOPY TIME:  Fluoroscopy Time: 1 minute.  (52 mGy). COMPLICATIONS: None immediate. PROCEDURE: Informed written consent was obtained from the patient after a thorough discussion of the procedural risks, benefits and alternatives. All questions were addressed. Maximal Sterile Barrier Technique was utilized including caps, mask, sterile gowns, sterile gloves, sterile drape, hand hygiene and skin antiseptic. A timeout was performed prior to the initiation of the procedure. Both kidneys were localized ultrasound. Under ultrasound guidance, access of the collecting systems was performed with 21 gauge needles. Transitional dilator is replaced. Tracts were dilated over guidewires and 10 French nephrostomy tubes were advanced bilaterally. Both tubes were injected with contrast to confirm position and fluoroscopic images saved. The catheters were connected to gravity bags and secured at the skin with Prolene retention sutures and StatLock devices. FINDINGS: There is significant hydronephrosis on the left. After lower pole access, a nephrostomy tube was advanced into the central collecting system and is draining well after placement. Right-sided hydronephrosis is present, to a lesser degree than  on the left. Puncture of the right kidney was more difficult by ultrasound guidance, resulting in a more central puncture of the collecting system. A nephrostomy tube was advanced into the collecting system such that it is partially formed and extends just into the proximal ureter. The tube is draining well after placement. IMPRESSION: Placement of bilateral percutaneous nephrostomy tubes. Bilateral 10  French nephrostomy tubes were placed and attached to gravity bag drainage. Electronically Signed   By: Aletta Edouard M.D.   On: 01/19/2017 15:11   Ir Nephrostomy Placement Right  Result Date: 01/19/2017 EXAM: IR NEPHROSTOMY PLACEMENT LEFT; IR NEPHROSTOMY PLACEMENT RIGHT COMPARISON:  None. MEDICATIONS: 2 g IV Ancef; The antibiotic was administered in an appropriate time frame prior to skin puncture. ANESTHESIA/SEDATION: Fentanyl 100 mcg IV; Versed 3.0 mg IV Moderate Sedation Time:  60 minutes. The patient was continuously monitored during the procedure by the interventional radiology nurse under my direct supervision. CONTRAST:  10 mL Isovue-300 - administered into the collecting system(s) FLUOROSCOPY TIME:  Fluoroscopy Time: 1 minute.  (52 mGy). COMPLICATIONS: None immediate. PROCEDURE: Informed written consent was obtained from the patient after a thorough discussion of the procedural risks, benefits and alternatives. All questions were addressed. Maximal Sterile Barrier Technique was utilized including caps, mask, sterile gowns, sterile gloves, sterile drape, hand hygiene and skin antiseptic. A timeout was performed prior to the initiation of the procedure. Both kidneys were localized ultrasound. Under ultrasound guidance, access of the collecting systems was performed with 21 gauge needles. Transitional dilator is replaced. Tracts were dilated over guidewires and 10 French nephrostomy tubes were advanced bilaterally. Both tubes were injected with contrast to confirm position and fluoroscopic images saved. The  catheters were connected to gravity bags and secured at the skin with Prolene retention sutures and StatLock devices. FINDINGS: There is significant hydronephrosis on the left. After lower pole access, a nephrostomy tube was advanced into the central collecting system and is draining well after placement. Right-sided hydronephrosis is present, to a lesser degree than on the left. Puncture of the right kidney was more difficult by ultrasound guidance, resulting in a more central puncture of the collecting system. A nephrostomy tube was advanced into the collecting system such that it is partially formed and extends just into the proximal ureter. The tube is draining well after placement. IMPRESSION: Placement of bilateral percutaneous nephrostomy tubes. Bilateral 10 French nephrostomy tubes were placed and attached to gravity bag drainage. Electronically Signed   By: Aletta Edouard M.D.   On: 01/19/2017 15:11   Ir Fluoro Guide Port Insertion Right  Result Date: 01/24/2017 INDICATION: 66 year old with history of rectal cancer and new lymphadenopathy. Port-A-Cath needed for treatment. EXAM: FLUOROSCOPIC AND ULTRASOUND GUIDED PLACEMENT OF A SUBCUTANEOUS PORT COMPARISON:  None. MEDICATIONS: Ancef 2 g; The antibiotic was administered within an appropriate time interval prior to skin puncture. ANESTHESIA/SEDATION: Versed 2.0 mg IV; Fentanyl 75 mcg IV; Moderate Sedation Time:  30 minutes The patient was continuously monitored during the procedure by the interventional radiology nurse under my direct supervision. FLUOROSCOPY TIME:  30 seconds, 12 mGy COMPLICATIONS: None immediate. PROCEDURE: The procedure, risks, benefits, and alternatives were explained to the patient. Questions regarding the procedure were encouraged and answered. The patient understands and consents to the procedure. Patient was placed supine on the interventional table. Ultrasound confirmed a patent right internal jugular vein. The right chest and  neck were cleaned with a skin antiseptic and a sterile drape was placed. Maximal barrier sterile technique was utilized including caps, mask, sterile gowns, sterile gloves, sterile drape, hand hygiene and skin antiseptic. The right neck was anesthetized with 1% lidocaine. Small incision was made in the right neck with a blade. Micropuncture set was placed in the right internal jugular vein with ultrasound guidance. The micropuncture wire was used for measurement purposes. The right chest was anesthetized with 1% lidocaine with epinephrine. #15  blade was used to make an incision and a subcutaneous port pocket was formed. Wendell was assembled. Subcutaneous tunnel was formed with a stiff tunneling device. The port catheter was brought through the subcutaneous tunnel. The port was placed in the subcutaneous pocket. The micropuncture set was exchanged for a peel-away sheath. The catheter was placed through the peel-away sheath and the tip was positioned at superior cavoatrial junction. Catheter placement was confirmed with fluoroscopy. The port was accessed and flushed with heparinized saline. The port pocket was closed using two layers of absorbable sutures and Dermabond. The vein skin site was closed using a single layer of absorbable suture and Dermabond. Sterile dressings were applied. Patient tolerated the procedure well without an immediate complication. Ultrasound and fluoroscopic images were taken and saved for this procedure. IMPRESSION: Placement of a subcutaneous port device. Electronically Signed   By: [name] M.D.   On: 01/24/2017 15:30       Today   Subjective:   Donald Townsend  Feeling better wants to go home  Objective:   Blood pressure 130/66, pulse 76, temperature 98.3 F (36.8 C), temperature source Oral, resp. rate 16, height 5\' 10"  (1.778 m), weight 254 lb (115.2 kg), SpO2 99 %.  .  Intake/Output Summary (Last 24 hours) at 02/03/17 1318 Last data filed at 02/03/17 0900   Gross per 24 hour  Intake          1528.75 ml  Output             2700 ml  Net         -1171.25 ml    Exam VITAL SIGNS: Blood pressure 130/66, pulse 76, temperature 98.3 F (36.8 C), temperature source Oral, resp. rate 16, height 5\' 10"  (1.778 m), weight 254 lb (115.2 kg), SpO2 99 %.  GENERAL:  66 y.o.-year-old patient lying in the bed with no acute distress.  EYES: Pupils equal, round, reactive to light and accommodation. No scleral icterus. Extraocular muscles intact.  HEENT: Head atraumatic, normocephalic. Oropharynx and nasopharynx clear.  NECK:  Supple, no jugular venous distention. No thyroid enlargement, no tenderness.  LUNGS: Normal breath sounds bilaterally, no wheezing, rales,rhonchi or crepitation. No use of accessory muscles of respiration.  CARDIOVASCULAR: S1, S2 normal. No murmurs, rubs, or gallops.  ABDOMEN: Soft, nontender, nondistended. Bowel sounds present. No organomegaly or mass.  EXTREMITIES: No pedal edema, cyanosis, or clubbing.  NEUROLOGIC: Cranial nerves II through XII are intact. Muscle strength 5/5 in all extremities. Sensation intact. Gait not checked.  PSYCHIATRIC: The patient is alert and oriented x 3.  SKIN: No obvious rash, lesion, or ulcer.   Data Review     CBC w Diff: Lab Results  Component Value Date   WBC 6.6 02/02/2017   HGB 11.3 (L) 02/02/2017   HGB 13.9 12/17/2012   HCT 32.3 (L) 02/02/2017   HCT 38.8 (L) 12/17/2012   PLT 184 02/02/2017   PLT 147 (L) 12/17/2012   LYMPHOPCT 6 01/31/2017   LYMPHOPCT 13.2 12/17/2012   MONOPCT 9 01/31/2017   MONOPCT 8.6 12/17/2012   EOSPCT 3 01/31/2017   EOSPCT 1.1 12/17/2012   BASOPCT 1 01/31/2017   BASOPCT 0.6 12/17/2012   CMP: Lab Results  Component Value Date   NA 133 (L) 02/02/2017   NA 139 12/17/2012   K 3.8 02/02/2017   K 3.8 12/17/2012   CL 101 02/02/2017   CL 108 (H) 12/17/2012   CO2 25 02/02/2017   CO2 26 12/17/2012  BUN 22 (H) 02/02/2017   BUN 21 01/01/2017   BUN 15 12/17/2012    CREATININE 1.60 (H) 02/02/2017   CREATININE 0.84 12/17/2012   PROT 7.7 01/31/2017   PROT 5.8 (L) 12/17/2012   ALBUMIN 3.3 (L) 01/31/2017   ALBUMIN 2.9 (L) 12/17/2012   BILITOT 0.7 01/31/2017   BILITOT 0.7 12/17/2012   ALKPHOS 95 01/31/2017   ALKPHOS 78 12/17/2012   AST 40 01/31/2017   AST 16 12/17/2012   ALT 36 01/31/2017   ALT 23 12/17/2012  .  Micro Results Recent Results (from the past 240 hour(s))  Culture, blood (Routine x 2)     Status: None (Preliminary result)   Collection Time: 01/31/17  3:09 PM  Result Value Ref Range Status   Specimen Description BLOOD BLOOD LEFT HAND  Final   Special Requests   Final    BOTTLES DRAWN AEROBIC AND ANAEROBIC Blood Culture adequate volume   Culture NO GROWTH 3 DAYS  Final   Report Status PENDING  Incomplete  Culture, blood (Routine x 2)     Status: None (Preliminary result)   Collection Time: 01/31/17  3:09 PM  Result Value Ref Range Status   Specimen Description BLOOD BLOOD RIGHT ARM  Final   Special Requests   Final    BOTTLES DRAWN AEROBIC AND ANAEROBIC Blood Culture adequate volume   Culture NO GROWTH 3 DAYS  Final   Report Status PENDING  Incomplete  Urine Culture     Status: Abnormal   Collection Time: 01/31/17  3:09 PM  Result Value Ref Range Status   Specimen Description URINE, RANDOM  Final   Special Requests NONE  Final   Culture >=100,000 COLONIES/mL KLEBSIELLA OXYTOCA (A)  Final   Report Status 02/03/2017 FINAL  Final   Organism ID, Bacteria KLEBSIELLA OXYTOCA (A)  Final      Susceptibility   Klebsiella oxytoca - MIC*    AMPICILLIN >=32 RESISTANT Resistant     CEFAZOLIN 8 SENSITIVE Sensitive     CEFTRIAXONE <=1 SENSITIVE Sensitive     CIPROFLOXACIN <=0.25 SENSITIVE Sensitive     GENTAMICIN <=1 SENSITIVE Sensitive     IMIPENEM <=0.25 SENSITIVE Sensitive     NITROFURANTOIN 32 SENSITIVE Sensitive     TRIMETH/SULFA <=20 SENSITIVE Sensitive     AMPICILLIN/SULBACTAM 16 INTERMEDIATE Intermediate     PIP/TAZO <=4  SENSITIVE Sensitive     Extended ESBL NEGATIVE Sensitive     * >=100,000 COLONIES/mL KLEBSIELLA OXYTOCA        Code Status Orders        Start     Ordered   01/31/17 1955  Full code  Continuous     01/31/17 1954    Code Status History    Date Active Date Inactive Code Status Order ID Comments User Context   01/18/2017  6:40 PM 01/21/2017  5:08 PM Full Code [id number]  Nicholes Mango, MD Inpatient   05/08/2016  6:37 PM 05/09/2016  7:34 PM Full Code [id number]  Schnier, Dolores Lory, MD Inpatient    Advance Directive Documentation     Most Recent Value  Type of Advance Directive  Healthcare Power of [place], Living will  Pre-existing out of facility DNR order (yellow form or pink MOST form)  -  "MOST" Form in Place?  -          Follow-up Information    [name], MD. Schedule an appointment as soon as possible for a visit in 2 month(s).   Specialty:  Urology  Why:  Follow up to arrange nephrostomy exchange by intervention radiology Contact information: [place] [address] [phone]        Dion Body, MD. Schedule an appointment as soon as possible for a visit in 5 day(s).   Specialty:  Family Medicine Why:  hosptial f/u Contact information: [place] [facility] [place] [address] [phone]        Cammie Sickle, MD. Schedule an appointment as soon as possible for a visit in 2 day(s).   Specialties:  Internal Medicine, Oncology Why:  for hospital f/u Contact information: [place] [address] [phone]           Discharge Medications   Allergies as of 02/03/2017   No Known Allergies     Medication List    TAKE these medications   aspirin EC 81 MG tablet Take 81 mg by mouth daily.   cefdinir 300 MG capsule Commonly known as:  OMNICEF Take 1 capsule (300 mg total) by mouth 2 (two) times daily.   digoxin 0.25 MG tablet Commonly known as:   LANOXIN Take 0.25 mg by mouth daily.   ELIQUIS 5 MG Tabs tablet Generic drug:  apixaban Take 5 mg by mouth every 12 (twelve) hours.   furosemide 40 MG tablet Commonly known as:  LASIX Take 40 mg by mouth daily.   glimepiride 4 MG tablet Commonly known as:  AMARYL Take 4 mg by mouth 2 (two) times daily.   HYDROcodone-acetaminophen 5-325 MG tablet Commonly known as:  NORCO/VICODIN Take 1 tablet by mouth every 6 (six) hours as needed for moderate pain. Notes to patient:  Last dose given today at 8:20am   insulin aspart 100 UNIT/ML FlexPen Commonly known as:  NOVOLOG Inject 3 Units into the skin 3 (three) times daily with meals.   Lancets 30G Misc Use 1 Units as directed. Check CBG's up to twice daily. Dx: E11.9   LANTUS SOLOSTAR 100 UNIT/ML Solostar Pen Generic drug:  Insulin Glargine Inject 10 Units into the skin at bedtime.   lidocaine-prilocaine cream Commonly known as:  EMLA Apply 1 application topically as needed. Apply generously over the Mediport 45 minutes prior to chemotherapy.   losartan 100 MG tablet Commonly known as:  COZAAR take 100mg  by mouth once daily   metoprolol tartrate 25 MG tablet Commonly known as:  LOPRESSOR Take 25 mg by mouth 2 (two) times daily.   ondansetron 8 MG tablet Commonly known as:  ZOFRAN 1 pill every 8 hours for nausea/vomitting as needed   pravastatin 20 MG tablet Commonly known as:  PRAVACHOL take 20mg  by mouth daily at bedtime   prochlorperazine 10 MG tablet Commonly known as:  COMPAZINE Take 1 tablet (10 mg total) by mouth every 6 (six) hours as needed for nausea or vomiting.   tamsulosin 0.4 MG Caps capsule Commonly known as:  FLOMAX Take 0.4 mg by mouth daily.          Total Time in preparing paper work, data evaluation and todays exam - 35 minutes  Dustin Flock M.D on 02/03/2017 at 1:18 PM  [facility]   Office  [phone]

## 2017-02-03 NOTE — Progress Notes (Signed)
ID E note Cx with KLebsiella sensitive to cephalosporins  REc cont ceftriaxone as inpt for ease of dosin then change to kelfex 500 tid at dc for at 14 day total abx course  Fu with urology as otpt

## 2017-02-04 ENCOUNTER — Encounter: Payer: Self-pay | Admitting: Internal Medicine

## 2017-02-04 ENCOUNTER — Telehealth: Payer: Self-pay | Admitting: Internal Medicine

## 2017-02-04 ENCOUNTER — Encounter: Payer: Self-pay | Admitting: *Deleted

## 2017-02-04 NOTE — Telephone Encounter (Signed)
FYI- Pt discharged from hospital- for UTI/sepsis. Will discuss with ID re: starting of chemo. [scheduled for 101/10]. br

## 2017-02-05 ENCOUNTER — Telehealth: Payer: Self-pay | Admitting: Internal Medicine

## 2017-02-05 LAB — CULTURE, BLOOD (ROUTINE X 2)
CULTURE: NO GROWTH
CULTURE: NO GROWTH
SPECIAL REQUESTS: ADEQUATE
Special Requests: ADEQUATE

## 2017-02-05 NOTE — Telephone Encounter (Signed)
noted 

## 2017-02-05 NOTE — Telephone Encounter (Signed)
FYI- th Discussed with Dr.Fitzgerald; he is okay with starting chemo in context of recent UTI. Will plan to start chemo- only 5FU [hold iri] on 10/10. Pt asked to keep appt.   Dr.B

## 2017-02-06 ENCOUNTER — Inpatient Hospital Stay (HOSPITAL_BASED_OUTPATIENT_CLINIC_OR_DEPARTMENT_OTHER): Payer: Medicare Other | Admitting: Internal Medicine

## 2017-02-06 ENCOUNTER — Inpatient Hospital Stay: Payer: Medicare Other

## 2017-02-06 VITALS — BP 105/65 | HR 85 | Temp 97.6°F | Resp 18 | Ht 70.0 in | Wt 248.0 lb

## 2017-02-06 DIAGNOSIS — R0602 Shortness of breath: Secondary | ICD-10-CM | POA: Diagnosis not present

## 2017-02-06 DIAGNOSIS — K760 Fatty (change of) liver, not elsewhere classified: Secondary | ICD-10-CM | POA: Diagnosis not present

## 2017-02-06 DIAGNOSIS — J449 Chronic obstructive pulmonary disease, unspecified: Secondary | ICD-10-CM

## 2017-02-06 DIAGNOSIS — Z794 Long term (current) use of insulin: Secondary | ICD-10-CM | POA: Diagnosis not present

## 2017-02-06 DIAGNOSIS — I4891 Unspecified atrial fibrillation: Secondary | ICD-10-CM

## 2017-02-06 DIAGNOSIS — Z8042 Family history of malignant neoplasm of prostate: Secondary | ICD-10-CM

## 2017-02-06 DIAGNOSIS — Z7901 Long term (current) use of anticoagulants: Secondary | ICD-10-CM

## 2017-02-06 DIAGNOSIS — C20 Malignant neoplasm of rectum: Secondary | ICD-10-CM

## 2017-02-06 DIAGNOSIS — E1151 Type 2 diabetes mellitus with diabetic peripheral angiopathy without gangrene: Secondary | ICD-10-CM | POA: Diagnosis not present

## 2017-02-06 DIAGNOSIS — Z7982 Long term (current) use of aspirin: Secondary | ICD-10-CM | POA: Diagnosis not present

## 2017-02-06 DIAGNOSIS — R590 Localized enlarged lymph nodes: Secondary | ICD-10-CM

## 2017-02-06 DIAGNOSIS — N189 Chronic kidney disease, unspecified: Secondary | ICD-10-CM | POA: Diagnosis not present

## 2017-02-06 DIAGNOSIS — M545 Low back pain: Secondary | ICD-10-CM

## 2017-02-06 DIAGNOSIS — G629 Polyneuropathy, unspecified: Secondary | ICD-10-CM | POA: Diagnosis not present

## 2017-02-06 DIAGNOSIS — I509 Heart failure, unspecified: Secondary | ICD-10-CM | POA: Diagnosis not present

## 2017-02-06 DIAGNOSIS — I129 Hypertensive chronic kidney disease with stage 1 through stage 4 chronic kidney disease, or unspecified chronic kidney disease: Secondary | ICD-10-CM | POA: Diagnosis not present

## 2017-02-06 DIAGNOSIS — R319 Hematuria, unspecified: Secondary | ICD-10-CM | POA: Diagnosis not present

## 2017-02-06 DIAGNOSIS — I6529 Occlusion and stenosis of unspecified carotid artery: Secondary | ICD-10-CM

## 2017-02-06 DIAGNOSIS — Z79899 Other long term (current) drug therapy: Secondary | ICD-10-CM

## 2017-02-06 DIAGNOSIS — Z5111 Encounter for antineoplastic chemotherapy: Secondary | ICD-10-CM | POA: Diagnosis not present

## 2017-02-06 DIAGNOSIS — Z803 Family history of malignant neoplasm of breast: Secondary | ICD-10-CM

## 2017-02-06 DIAGNOSIS — I2510 Atherosclerotic heart disease of native coronary artery without angina pectoris: Secondary | ICD-10-CM | POA: Diagnosis not present

## 2017-02-06 DIAGNOSIS — I739 Peripheral vascular disease, unspecified: Secondary | ICD-10-CM | POA: Diagnosis not present

## 2017-02-06 DIAGNOSIS — M7989 Other specified soft tissue disorders: Secondary | ICD-10-CM

## 2017-02-06 DIAGNOSIS — R1900 Intra-abdominal and pelvic swelling, mass and lump, unspecified site: Secondary | ICD-10-CM | POA: Diagnosis not present

## 2017-02-06 DIAGNOSIS — I700 Atherosclerosis of aorta: Secondary | ICD-10-CM

## 2017-02-06 DIAGNOSIS — Z86718 Personal history of other venous thrombosis and embolism: Secondary | ICD-10-CM | POA: Diagnosis not present

## 2017-02-06 DIAGNOSIS — Z8041 Family history of malignant neoplasm of ovary: Secondary | ICD-10-CM

## 2017-02-06 DIAGNOSIS — N136 Pyonephrosis: Secondary | ICD-10-CM | POA: Diagnosis not present

## 2017-02-06 LAB — COMPREHENSIVE METABOLIC PANEL
ALBUMIN: 3.3 g/dL — AB (ref 3.5–5.0)
ALK PHOS: 107 U/L (ref 38–126)
ALT: 43 U/L (ref 17–63)
ANION GAP: 12 (ref 5–15)
AST: 33 U/L (ref 15–41)
BUN: 25 mg/dL — ABNORMAL HIGH (ref 6–20)
CALCIUM: 8.7 mg/dL — AB (ref 8.9–10.3)
CO2: 23 mmol/L (ref 22–32)
Chloride: 99 mmol/L — ABNORMAL LOW (ref 101–111)
Creatinine, Ser: 1.74 mg/dL — ABNORMAL HIGH (ref 0.61–1.24)
GFR calc Af Amer: 46 mL/min — ABNORMAL LOW (ref >60)
GFR calc non Af Amer: 39 mL/min — ABNORMAL LOW (ref >60)
GLUCOSE: 219 mg/dL — AB (ref 65–99)
POTASSIUM: 4.1 mmol/L (ref 3.5–5.1)
SODIUM: 134 mmol/L — AB (ref 135–145)
Total Bilirubin: 0.5 mg/dL (ref 0.3–1.2)
Total Protein: 7.4 g/dL (ref 6.5–8.1)

## 2017-02-06 LAB — CBC WITH DIFFERENTIAL/PLATELET
BASOS ABS: 0.1 10*3/uL (ref 0–0.1)
BASOS PCT: 1 %
EOS ABS: 1 10*3/uL — AB (ref 0–0.7)
Eosinophils Relative: 9 %
HCT: 34.7 % — ABNORMAL LOW (ref 40.0–52.0)
HEMOGLOBIN: 11.9 g/dL — AB (ref 13.0–18.0)
Lymphocytes Relative: 12 %
Lymphs Abs: 1.3 10*3/uL (ref 1.0–3.6)
MCH: 29.2 pg (ref 26.0–34.0)
MCHC: 34.3 g/dL (ref 32.0–36.0)
MCV: 85.1 fL (ref 80.0–100.0)
MONOS PCT: 6 %
Monocytes Absolute: 0.7 10*3/uL (ref 0.2–1.0)
NEUTROS PCT: 72 %
Neutro Abs: 7.5 10*3/uL — ABNORMAL HIGH (ref 1.4–6.5)
Platelets: 312 10*3/uL (ref 150–440)
RBC: 4.07 MIL/uL — ABNORMAL LOW (ref 4.40–5.90)
RDW: 13.3 % (ref 11.5–14.5)
WBC: 10.6 10*3/uL (ref 3.8–10.6)

## 2017-02-06 MED ORDER — ONDANSETRON 8 MG PO TBDP
8.0000 mg | ORAL_TABLET | Freq: Once | ORAL | Status: AC
  Administered 2017-02-06: 8 mg via ORAL
  Filled 2017-02-06: qty 1

## 2017-02-06 MED ORDER — SODIUM CHLORIDE 0.9 % IV SOLN
Freq: Once | INTRAVENOUS | Status: AC
  Administered 2017-02-06: 10:00:00 via INTRAVENOUS
  Filled 2017-02-06: qty 1000

## 2017-02-06 MED ORDER — LEUCOVORIN CALCIUM INJECTION 350 MG: 400.0000 mg/m2 | Freq: Once | INTRAVENOUS | Status: DC

## 2017-02-06 MED ORDER — FLUOROURACIL CHEMO INJECTION 2.5 GM/50ML
1000.0000 mg | Freq: Once | INTRAVENOUS | Status: AC
  Administered 2017-02-06: 1000 mg via INTRAVENOUS
  Filled 2017-02-06: qty 20

## 2017-02-06 MED ORDER — ATROPINE SULFATE 1 MG/ML IJ SOLN: 0.5000 mg | Freq: Once | INTRAMUSCULAR | Status: DC | PRN

## 2017-02-06 MED ORDER — SODIUM CHLORIDE 0.9 % IV SOLN
2400.0000 mg/m2 | INTRAVENOUS | Status: DC
  Administered 2017-02-06: 5700 mg via INTRAVENOUS
  Filled 2017-02-06: qty 114

## 2017-02-06 MED ORDER — LEUCOVORIN CALCIUM INJECTION 100 MG
20.0000 mg/m2 | Freq: Once | INTRAMUSCULAR | Status: AC
  Administered 2017-02-06: 48 mg via INTRAVENOUS
  Filled 2017-02-06: qty 2.4

## 2017-02-06 NOTE — Progress Notes (Signed)
[place] NOTE  Patient Care Team: Dion Body, MD as PCP - General (Family Medicine) Clent Jacks, RN as Registered Nurse  CHIEF COMPLAINTS/PURPOSE OF CONSULTATION: RECTAL CANCER  #  Oncology History   # 2012- RECTO-SIGMOID-poorly diff STAGE III CA [s/p neo-adj chemo-RT- poor response]; APR [residual ypT3; 5/8 LN positiveDr.Smith/Dr.Pandit]  S/p FOLFOX   # SEP 2019- Pre-sacral fluid/mass- increasing ~ 5-6 cm [increased from dec 2017; also NEW retroperitoneal lymph nodes; inguinal adenopathy]; SEP 2018- left supraclavicular; mediastinal; right hilar-retroperitoneal; pelvic mass/node; bilateral inguinal adenopathy; s/p right Ingiunal LNBx - RECURRENT CA [necrotic;insuff tissue for F-One]  # OCT 8th 2018-FOLFIRI  # LEFT LE DVT [? [facility]- Dr.Schneir]; Eliquis.   # CHF/COPD/CKD/ PN sec to [place]  #      Rectal cancer ([place])    HISTORY OF PRESENTING ILLNESS:  Donald Townsend 66 y.o.  male  above History of recurrent rectal cancer is here for follow-up/proceed with chemotherapy.  Patient interval was admitted to the hospital for fevers/UTI- treated with Keflex. He was discharged from the hospital 2 days ago.  Overall he feels okay. No fevers or chills. No nausea no vomiting. Chronic mild back pain.    Patient denies any pain. He has chronic mild swelling of the left lower extremity. Chronic tingling and numbness of his bilateral lower extremities-not any worse. Patient does complain of decreased urinary output from the left nephrostomy bag.  ROS: A complete 10 point review of system is done which is negative except mentioned above in history of present illness  MEDICAL HISTORY:  Past Medical History:  Diagnosis Date  . A-fib ([place])   . Cancer [facility])    Colon  . CHF (congestive heart failure) ([place])   . Diabetes mellitus without complication ([place])   . Dyspnea   . Elevated lipids   . Hypertension   . PVD (peripheral vascular disease)  ([place])     SURGICAL HISTORY: Past Surgical History:  Procedure Laterality Date  . COLON RESECTION     with colostomy  . COLONOSCOPY    . CYSTOSCOPY WITH STENT PLACEMENT Bilateral 01/18/2017   Procedure: CYSTOSCOPY WITH STENT PLACEMENT;  Surgeon: [name], MD;  Location: [facility] ORS;  Service: Urology;  Laterality: Bilateral;  . IR FLUORO GUIDE PORT INSERTION RIGHT  01/24/2017  . IR NEPHROSTOMY PLACEMENT LEFT  01/19/2017  . IR NEPHROSTOMY PLACEMENT RIGHT  01/19/2017  . IVC FILTER REMOVAL N/A 08/28/2016   Procedure: IVC Filter Removal;  Surgeon: Katha Cabal, MD;  Location: [place] CV LAB;  Service: Cardiovascular;  Laterality: N/A;  . LOWER EXTREMITY VENOGRAPHY Left 12/18/2016   Procedure: Lower Extremity Venography;  Surgeon: Katha Cabal, MD;  Location: [place] CV LAB;  Service: Cardiovascular;  Laterality: Left;  . PERIPHERAL VASCULAR CATHETERIZATION Left 05/08/2016   Procedure: Lower Extremity Venography;  Surgeon: Katha Cabal, MD;  Location: [place] CV LAB;  Service: Cardiovascular;  Laterality: Left;    SOCIAL HISTORY: [employer]; retdOccupational psychologist; currently works part time. He lives alone. No smoking occasional alcohol. Social History   Social History  . Marital status: Divorced    Spouse name: N/A  . Number of children: N/A  . Years of education: N/A   Occupational History  . Not on file.   Social History Main Topics  . Smoking status: Never Smoker  . Smokeless tobacco: Never Used  . Alcohol use No  . Drug use: No  . Sexual activity: Not on file  Other Topics Concern  . Not on file   Social History Narrative  . No narrative on file    FAMILY HISTORY: Family History  Problem Relation Age of Onset  . Diabetes Mother   . Stroke Mother   . Prostate cancer Brother   . Cancer Sister        Breast cancer  . Kidney cancer Neg Hx   . Bladder Cancer Neg Hx     ALLERGIES:  has No Known Allergies.  MEDICATIONS:  Current  Outpatient Prescriptions  Medication Sig Dispense Refill  . aspirin EC 81 MG tablet Take 81 mg by mouth daily.     . cefdinir (OMNICEF) 300 MG capsule Take 1 capsule (300 mg total) by mouth 2 (two) times daily. 20 capsule 0  . digoxin (LANOXIN) 0.25 MG tablet Take 0.25 mg by mouth daily.     . furosemide (LASIX) 40 MG tablet Take 40 mg by mouth daily.   0  . glimepiride (AMARYL) 4 MG tablet Take 4 mg by mouth 2 (two) times daily.   0  . HYDROcodone-acetaminophen (NORCO/VICODIN) 5-325 MG tablet Take 1 tablet by mouth every 6 (six) hours as needed for moderate pain. 90 tablet 0  . insulin aspart (NOVOLOG) 100 UNIT/ML FlexPen Inject 3 Units into the skin 3 (three) times daily with meals.     . Lancets 30G MISC Use 1 Units as directed. Check CBG's up to twice daily. Dx: E11.9    . LANTUS SOLOSTAR 100 UNIT/ML Solostar Pen Inject 15 Units into the skin at bedtime.   0  . lidocaine-prilocaine (EMLA) cream Apply 1 application topically as needed. Apply generously over the Mediport 45 minutes prior to chemotherapy. 30 g 0  . losartan (COZAAR) 100 MG tablet take 100mg  by mouth once daily    . metoprolol tartrate (LOPRESSOR) 25 MG tablet Take 25 mg by mouth 2 (two) times daily.     . pravastatin (PRAVACHOL) 20 MG tablet take 20mg  by mouth daily at bedtime    . prochlorperazine (COMPAZINE) 10 MG tablet Take 1 tablet (10 mg total) by mouth every 6 (six) hours as needed for nausea or vomiting. 40 tablet 1  . tamsulosin (FLOMAX) 0.4 MG CAPS capsule Take 0.4 mg by mouth daily.  0  . ELIQUIS 5 MG TABS tablet Take 5 mg by mouth every 12 (twelve) hours.  0  . ondansetron (ZOFRAN) 8 MG tablet 1 pill every 8 hours for nausea/vomitting as needed (Patient not taking: Reported on 02/06/2017) 40 tablet 2   No current facility-administered medications for this visit.    Facility-Administered Medications Ordered in Other Visits  Medication Dose Route Frequency Provider Last Rate Last Dose  . fluorouracil (ADRUCIL)  5,700 mg in sodium chloride 0.9 % 136 mL chemo infusion  2,400 mg/m2 (Treatment Plan Recorded) Intravenous 1 day or 1 dose Charlaine Dalton R, MD   5,700 mg at 02/06/17 1109      .  PHYSICAL EXAMINATION: ECOG PERFORMANCE STATUS: 0 - Asymptomatic  Vitals:   02/06/17 0851  BP: 105/65  Pulse: 85  Resp: 18  Temp: 97.6 F (36.4 C)   Filed Weights   02/06/17 0851  Weight: 248 lb (112.5 kg)    GENERAL: Well-nourished well-developed; Alert, no distress and comfortable.   With his Sister. EYES: no pallor or icterus OROPHARYNX: no thrush or ulceration; good dentition  NECK: supple, no masses felt LYMPH:  no palpable lymphadenopathy in the cervical, axillary or inguinal regions LUNGS: clear to auscultation  and  No wheeze or crackles HEART/CVS: regular rate & rhythm and no murmurs; No lower extremity edema ABDOMEN: abdomen soft, non-tender and normal bowel sounds; colostomy in place. Bilateral nephrostomy tubes in place. Musculoskeletal:no cyanosis of digits and no clubbing  PSYCH: alert & oriented x 3 with fluent speech NEURO: no focal motor/sensory deficits SKIN:  no rashes or significant lesions  LABORATORY DATA:  I have reviewed the data as listed Lab Results  Component Value Date   WBC 10.6 02/06/2017   HGB 11.9 (L) 02/06/2017   HCT 34.7 (L) 02/06/2017   MCV 85.1 02/06/2017   PLT 312 02/06/2017    Recent Labs  01/28/17 1317 01/31/17 1509 02/01/17 0046 02/02/17 0401 02/06/17 0820  NA 136 132* 132* 133* 134*  K 4.5 4.3 3.9 3.8 4.1  CL 104 99* 102 101 99*  CO2 22 22 24 25 23   GLUCOSE 181* 172* 124* 170* 219*  BUN 21* 29* 24* 22* 25*  CREATININE 1.76* 1.90* 1.61* 1.60* 1.74*  CALCIUM 8.8* 9.5 8.3* 8.1* 8.7*  GFRNONAA 39* 35* 43* 44* 39*  GFRAA 45* 41* 50* 51* 46*  PROT 7.1 7.7  --   --  7.4  ALBUMIN 3.3* 3.3*  --   --  3.3*  AST 25 40  --   --  33  ALT 26 36  --   --  43  ALKPHOS 80 95  --   --  107  BILITOT 0.8 0.7  --   --  0.5    RADIOGRAPHIC  STUDIES: I have personally reviewed the radiological images as listed and agreed with the findings in the report. Dg Chest 2 View  Result Date: 01/31/2017 CLINICAL DATA:  Fever and chills, recent port placement and biopsy EXAM: CHEST  2 VIEW COMPARISON:  None. FINDINGS: Cardiac shadow is within normal limits. The lungs are well aerated bilaterally. No focal infiltrate or sizable effusion is seen. Right-sided chest wall port is noted in satisfactory position. No acute bony abnormality is noted. IMPRESSION: No active cardiopulmonary disease. Electronically Signed   By: [name] M.D.   On: 01/31/2017 16:26   Nm Pet Image Restag (ps) Skull Base To Thigh  Result Date: 01/22/2017 CLINICAL DATA:  Subsequent treatment strategy for followup of rectal cancer. EXAM: NUCLEAR MEDICINE PET SKULL BASE TO THIGH TECHNIQUE: 13.0 mCi F-18 FDG was injected intravenously. Full-ring PET imaging was performed from the skull base to thigh after the radiotracer. CT data was obtained and used for attenuation correction and anatomic localization. FASTING BLOOD GLUCOSE:  Value: 145 mg/dl COMPARISON:  CT 01/11/2017. FINDINGS: NECK: Low left jugular and supraclavicular hypermetabolic nodes. An index left supraclavicular nodal mass measures 1.7 cm and a S.U.V. max of 10.0 on image 69/series 3. Right-sided carotid atherosclerosis. Right maxillary mucous retention cyst or polyp. CHEST: High left mediastinal node measures 12 mm and a S.U.V. max of 4.6 on image 83/series 3. A right infrahilar node measures a S.U.V. max of 8.0 on image 100/ series 3. Not well visualized on CT. Right retrocrural node measures 8 mm and a S.U.V. max of 6.9 on image 146/series 3. Mild cardiomegaly with multivessel coronary artery atherosclerosis. Pulmonary artery enlargement, 3.7 cm outflow tract. Mild centrilobular emphysema. ABDOMEN/PELVIS: Extensive hypermetabolic retroperitoneal adenopathy. Example left periaortic nodes measure up to 1.5 cm and a S.U.V.  max of 16.5 on image 173/series 3. Bilateral pelvic sidewall adenopathy, with a right external iliac node measuring 1.5 cm and a S.U.V. max of 23.2. Bilateral hypermetabolic inguinal nodes,  with a index left inguinal node measuring 1.6 cm and a S.U.V. max of 6.2 on image 282/series 3. Heterogeneous soft tissue density about the pelvic cul-de-sac corresponds to hypermetabolism. This measures a S.U.V. max of 13.7, including image 20 of 51/series 3. This presumably represents a combination of prostate and surrounding recurrent tumor. Left adrenal hypermetabolism which is felt to correspond to minimal nodularity. This measures 11 mm and a S.U.V. max of 5.8 on image 155/series 3. Moderate hepatic steatosis. Placement of bilateral nephrostomy catheters. Small volume perinephric hematoma posteriorly on image 173/ series 3. Improved appearance of right greater than left hydronephrosis. Descending colostomy. Air within left renal collecting system and urinary bladder. Thick walled bladder is similar. SKELETON: Degenerative partial fusion of the bilateral sacroiliac joints. IMPRESSION: 1. Hypermetabolism corresponding to heterogeneous soft tissue density in the pelvic cul-de-sac. This likely represents local tumor recurrence, superimposed upon the prostate. 2. Nodal metastasis within the neck, chest, abdomen, and pelvis. 3. Placement of nephrostomy catheters, with improvement in bilateral hydronephrosis. Small volume right perinephric hematoma. 4. Minimal left adrenal nodularity and hypermetabolism, for which metastatic disease cannot be excluded. 5. Coronary artery atherosclerosis. Aortic Atherosclerosis (ICD10-I70.0). 6. Pulmonary artery enlargement suggests pulmonary arterial hypertension. 7. Hepatic steatosis. 8. Air in the left renal collecting system is likely due to recent nephrostomy. Air within the bladder could be secondary. Similar thick walled bladder could be radiation induced. Infectious cystitis cannot be  excluded. Electronically Signed   By: [name] M.D.   On: 01/22/2017 14:30   [place] Core Biopsy (lymph Nodes)  Addendum Date: 01/24/2017   ADDENDUM REPORT: 01/24/2017 15:34 ADDENDUM: Correction to the sedation medicines:  Fentanyl 25 mcg, Versed 1 mg Electronically Signed   By: [name] M.D.   On: 01/24/2017 15:34   Result Date: 01/24/2017 INDICATION: 66 year old with a history of rectal cancer. Patient now has evidence for diffuse metastatic disease including multiple enlarged lymph nodes. Tissue diagnosis is needed. EXAM: ULTRASOUND GUIDED CORE BIOPSY OF RIGHT INGUINAL LYMPH NODE MEDICATIONS: None. ANESTHESIA/SEDATION: Fentanyl 50 mcg IV; Versed 1 mg IV Moderate Sedation Time:  16 minutes The patient was continuously monitored during the procedure by the interventional radiology nurse under my direct supervision. PROCEDURE: The procedure, risks, benefits, and alternatives were explained to the patient. Questions regarding the procedure were encouraged and answered. The patient understands and consents to the procedure. Ultrasound demonstrated enlarged right inguinal lymph nodes. A slightly irregular hypoechoic lymph node was selected for biopsy. Right groin was prepped with chlorhexidine and sterile field was created. Skin was anesthetized with 1% lidocaine. Using ultrasound guidance, core biopsies were obtained within the right inguinal lymph node. Total of 6 core biopsies were obtained. Five biopsies were placed in formalin and 1 was placed on a Telfa pad. Bandage placed over the puncture site. COMPLICATIONS: None immediate. FINDINGS: Enlarged bilateral inguinal lymph nodes. Core biopsies obtained from a right inguinal lymph node. No significant bleeding or hematoma formation following the core biopsies. IMPRESSION: Successful ultrasound-guided core biopsies of a right inguinal lymph node. Electronically Signed: By: [name] M.D. On: 01/24/2017 11:24   Ct Hematuria Workup  Result Date:  01/11/2017 CLINICAL DATA:  Followup colon cancer. EXAM: CT ABDOMEN AND PELVIS WITHOUT AND WITH CONTRAST TECHNIQUE: Multidetector CT imaging of the abdomen and pelvis was performed following the standard protocol before and following the bolus administration of intravenous contrast. CONTRAST:  178mL ISOVUE-300 IOPAMIDOL (ISOVUE-300) INJECTION 61% COMPARISON:  CT scan 04/27/2016 and 08/24/2014 FINDINGS: Lower chest: The lung bases are clear  of acute process. No pleural effusion or pulmonary lesions. The heart is normal in size. No pericardial effusion. Stable coronary artery calcifications. The distal esophagus and aorta are unremarkable. Hepatobiliary: No focal hepatic lesions or intrahepatic biliary dilatation the gallbladder appears normal. No common bile duct dilatation. Pancreas: No mass, inflammation or ductal dilatation. Spleen: Normal size.  No focal lesions. Adrenals/Urinary Tract: The adrenal glands are normal in stable. Bilateral hydroureteronephrosis down to enlarging pelvic mass. The right-sided hydroureteronephrosis is new and the left side is progressive. The bladder contains a Foley catheter. There is diffuse asymmetric bladder wall thickening which appears progressive. The delayed images demonstrate compression but not complete obstruction of the right ureter. Stomach/Bowel: The stomach, duodenum, small bowel and colon are grossly normal without oral contrast. No acute inflammatory changes or obstructive findings. Stable left lower quadrant colostomy. Vascular/Lymphatic: Stable atherosclerotic calcifications involving the aorta and iliac arteries. No aneurysm. New retroperitoneal lymphadenopathy. 15 mm periaortic lymph node on image number 38. 14 mm right-sided retroperitoneal lymph node posterior to the IVC on image number 41. Left para-aortic lymph node on image number 50 measures 13.5 mm. A left common iliac vein stent is noted. Reproductive: The prostate gland is grossly normal. The seminal  vesicles are difficult to identified. Other: Enlarging presacral soft tissue mass which appears confluent with the base of the bladder. Central fluid collection appears stable. The presacral tumor is extending higher up into the pelvis where it is likely obstructing both ureters. Bilateral inguinal lymphadenopathy is new. Index node on the right side on image number 94 measures 14.5 mm an index node on the left side on image number 102 measures 17 mm. Musculoskeletal: No definite metastatic osseous disease. IMPRESSION: 1. 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 New right-sided hydroureteronephrosis with compression of the distal ureter but no complete obstruction. The left ureter appears completely obstructed. 2. New retroperitoneal lymphadenopathy. 3. No findings for bowel obstruction. Left lower quadrant colostomy is stable. 4. No definite CT findings for hepatic metastatic disease. 5. New bilateral inguinal lymphadenopathy. Electronically Signed   By: Marijo Sanes M.D.   On: 01/11/2017 13:18   Ir Nephrostomy Placement Left  Result Date: 01/19/2017 EXAM: IR NEPHROSTOMY PLACEMENT LEFT; IR NEPHROSTOMY PLACEMENT RIGHT COMPARISON:  None. MEDICATIONS: 2 g IV Ancef; The antibiotic was administered in an appropriate time frame prior to skin puncture. ANESTHESIA/SEDATION: Fentanyl 100 mcg IV; Versed 3.0 mg IV Moderate Sedation Time:  60 minutes. The patient was continuously monitored during the procedure by the interventional radiology nurse under my direct supervision. CONTRAST:  10 mL Isovue-300 - administered into the collecting system(s) FLUOROSCOPY TIME:  Fluoroscopy Time: 1 minute.  (52 mGy). COMPLICATIONS: None immediate. PROCEDURE: Informed written consent was obtained from the patient after a thorough discussion of the procedural risks, benefits and alternatives. All questions were addressed. Maximal Sterile Barrier Technique  was utilized including caps, mask, sterile gowns, sterile gloves, sterile drape, hand hygiene and skin antiseptic. A timeout was performed prior to the initiation of the procedure. Both kidneys were localized ultrasound. Under ultrasound guidance, access of the collecting systems was performed with 21 gauge needles. Transitional dilator is replaced. Tracts were dilated over guidewires and 10 French nephrostomy tubes were advanced bilaterally. Both tubes were injected with contrast to confirm position and fluoroscopic images saved. The catheters were connected to gravity bags and secured at the skin with Prolene retention sutures and StatLock devices. FINDINGS: There is significant hydronephrosis on  the left. After lower pole access, a nephrostomy tube was advanced into the central collecting system and is draining well after placement. Right-sided hydronephrosis is present, to a lesser degree than on the left. Puncture of the right kidney was more difficult by ultrasound guidance, resulting in a more central puncture of the collecting system. A nephrostomy tube was advanced into the collecting system such that it is partially formed and extends just into the proximal ureter. The tube is draining well after placement. IMPRESSION: Placement of bilateral percutaneous nephrostomy tubes. Bilateral 10 French nephrostomy tubes were placed and attached to gravity bag drainage. Electronically Signed   By: Aletta Edouard M.D.   On: 01/19/2017 15:11   Ir Nephrostomy Placement Right  Result Date: 01/19/2017 EXAM: IR NEPHROSTOMY PLACEMENT LEFT; IR NEPHROSTOMY PLACEMENT RIGHT COMPARISON:  None. MEDICATIONS: 2 g IV Ancef; The antibiotic was administered in an appropriate time frame prior to skin puncture. ANESTHESIA/SEDATION: Fentanyl 100 mcg IV; Versed 3.0 mg IV Moderate Sedation Time:  60 minutes. The patient was continuously monitored during the procedure by the interventional radiology nurse under my direct supervision.  CONTRAST:  10 mL Isovue-300 - administered into the collecting system(s) FLUOROSCOPY TIME:  Fluoroscopy Time: 1 minute.  (52 mGy). COMPLICATIONS: None immediate. PROCEDURE: Informed written consent was obtained from the patient after a thorough discussion of the procedural risks, benefits and alternatives. All questions were addressed. Maximal Sterile Barrier Technique was utilized including caps, mask, sterile gowns, sterile gloves, sterile drape, hand hygiene and skin antiseptic. A timeout was performed prior to the initiation of the procedure. Both kidneys were localized ultrasound. Under ultrasound guidance, access of the collecting systems was performed with 21 gauge needles. Transitional dilator is replaced. Tracts were dilated over guidewires and 10 French nephrostomy tubes were advanced bilaterally. Both tubes were injected with contrast to confirm position and fluoroscopic images saved. The catheters were connected to gravity bags and secured at the skin with Prolene retention sutures and StatLock devices. FINDINGS: There is significant hydronephrosis on the left. After lower pole access, a nephrostomy tube was advanced into the central collecting system and is draining well after placement. Right-sided hydronephrosis is present, to a lesser degree than on the left. Puncture of the right kidney was more difficult by ultrasound guidance, resulting in a more central puncture of the collecting system. A nephrostomy tube was advanced into the collecting system such that it is partially formed and extends just into the proximal ureter. The tube is draining well after placement. IMPRESSION: Placement of bilateral percutaneous nephrostomy tubes. Bilateral 10 French nephrostomy tubes were placed and attached to gravity bag drainage. Electronically Signed   By: Aletta Edouard M.D.   On: 01/19/2017 15:11   Ir Fluoro Guide Port Insertion Right  Result Date: 01/24/2017 INDICATION: 66 year old with history of  rectal cancer and new lymphadenopathy. Port-A-Cath needed for treatment. EXAM: FLUOROSCOPIC AND ULTRASOUND GUIDED PLACEMENT OF A SUBCUTANEOUS PORT COMPARISON:  None. MEDICATIONS: Ancef 2 g; The antibiotic was administered within an appropriate time interval prior to skin puncture. ANESTHESIA/SEDATION: Versed 2.0 mg IV; Fentanyl 75 mcg IV; Moderate Sedation Time:  30 minutes The patient was continuously monitored during the procedure by the interventional radiology nurse under my direct supervision. FLUOROSCOPY TIME:  30 seconds, 12 mGy COMPLICATIONS: None immediate. PROCEDURE: The procedure, risks, benefits, and alternatives were explained to the patient. Questions regarding the procedure were encouraged and answered. The patient understands and consents to the procedure. Patient was placed supine on the interventional table. Ultrasound confirmed a  patent right internal jugular vein. The right chest and neck were cleaned with a skin antiseptic and a sterile drape was placed. Maximal barrier sterile technique was utilized including caps, mask, sterile gowns, sterile gloves, sterile drape, hand hygiene and skin antiseptic. The right neck was anesthetized with 1% lidocaine. Small incision was made in the right neck with a blade. Micropuncture set was placed in the right internal jugular vein with ultrasound guidance. The micropuncture wire was used for measurement purposes. The right chest was anesthetized with 1% lidocaine with epinephrine. #15 blade was used to make an incision and a subcutaneous port pocket was formed. Sentinel was assembled. Subcutaneous tunnel was formed with a stiff tunneling device. The port catheter was brought through the subcutaneous tunnel. The port was placed in the subcutaneous pocket. The micropuncture set was exchanged for a peel-away sheath. The catheter was placed through the peel-away sheath and the tip was positioned at superior cavoatrial junction. Catheter placement was  confirmed with fluoroscopy. The port was accessed and flushed with heparinized saline. The port pocket was closed using two layers of absorbable sutures and Dermabond. The vein skin site was closed using a single layer of absorbable suture and Dermabond. Sterile dressings were applied. Patient tolerated the procedure well without an immediate complication. Ultrasound and fluoroscopic images were taken and saved for this procedure. IMPRESSION: Placement of a subcutaneous port device. Electronically Signed   By: [name] M.D.   On: 01/24/2017 15:30    ASSESSMENT & PLAN:   Rectal cancer [facility]) # RECURRENT RECTAL CANCER STAGE IV- [s/p LN Bx] PET September 2018 shows progressive presacral fluid collection; bilateral inguinal lymphadenopathy pelvic and also retroperitoneal adenopathy; left supraclavicular adenopathy; left mediastinal; right hilar adenopathy.  # recommend 5FU infusion alone today [given recent UTI on antibiotics; HOLD IRI; discussed with Dr.Fitzgerald]  # Discussed the potential side effects including but not limited to-increasing fatigue, nausea vomiting, diarrhea, hair loss, sores in the mouth, increase risk of infection and hand-foot syndrome.   # Acute renal insufficiency/CKD- secondary to bilateral hydronephrosis from the underlying pelvic mass. S/p PNC bil. Renal function improving/stabilizing with creatinine at 1.7. Monitor closely.  # Back pain- secondary to pelvic mass/PCN; recommend hydrocodone.new script given.   # CHF-compensated stable.  # Acute DVT Left -continue Eliquis.   # DM-2 ; on insulin/ amaryl;   # follow up in 2 weeks/labs;FOLFIRI/labs/CEA/D; 1 week-cbc/bmp.  Cc; Dr.L  All questions were answered. The patient knows to call the clinic with any problems, questions or concerns.     Cammie Sickle, MD 02/06/2017 1:14 PM

## 2017-02-06 NOTE — Assessment & Plan Note (Addendum)
#   RECURRENT RECTAL CANCER STAGE IV- [s/p LN Bx] PET September 2018 shows progressive presacral fluid collection; bilateral inguinal lymphadenopathy pelvic and also retroperitoneal adenopathy; left supraclavicular adenopathy; left mediastinal; right hilar adenopathy.  # recommend 5FU infusion alone today [given recent UTI on antibiotics; HOLD IRI; discussed with Dr.Fitzgerald]  # Discussed the potential side effects including but not limited to-increasing fatigue, nausea vomiting, diarrhea, hair loss, sores in the mouth, increase risk of infection and hand-foot syndrome.   # Acute renal insufficiency/CKD- secondary to bilateral hydronephrosis from the underlying pelvic mass. S/p PNC bil. Renal function improving/stabilizing with creatinine at 1.7. Monitor closely.  # Back pain- secondary to pelvic mass/PCN; recommend hydrocodone.new script given.   # CHF-compensated stable.  # Acute DVT Left -continue Eliquis.   # DM-2 ; on insulin/ amaryl;   # follow up in 2 weeks/labs;FOLFIRI/labs/CEA/D; 1 week-cbc/bmp.  Cc; Dr.L

## 2017-02-06 NOTE — Progress Notes (Signed)
Patient will get 36fu bolus and pump only today per MD with zofran as pre medication.

## 2017-02-07 ENCOUNTER — Telehealth: Payer: Self-pay | Admitting: Internal Medicine

## 2017-02-07 LAB — CEA: CEA: 138 ng/mL — ABNORMAL HIGH (ref 0.0–4.7)

## 2017-02-07 NOTE — Telephone Encounter (Signed)
Will re-order Foundation One on 2012 sample/ original tumor- surgery specimen.

## 2017-02-07 NOTE — Telephone Encounter (Signed)
1006 am- Forms resubmitted via fax to foundation with request for foundation testing on 2012 specimen.

## 2017-02-08 ENCOUNTER — Inpatient Hospital Stay: Payer: Medicare Other

## 2017-02-08 VITALS — BP 154/82 | HR 90 | Temp 97.8°F | Resp 18

## 2017-02-08 DIAGNOSIS — R319 Hematuria, unspecified: Secondary | ICD-10-CM | POA: Diagnosis not present

## 2017-02-08 DIAGNOSIS — C20 Malignant neoplasm of rectum: Secondary | ICD-10-CM | POA: Diagnosis not present

## 2017-02-08 DIAGNOSIS — Z5111 Encounter for antineoplastic chemotherapy: Secondary | ICD-10-CM | POA: Diagnosis not present

## 2017-02-08 DIAGNOSIS — Z7982 Long term (current) use of aspirin: Secondary | ICD-10-CM | POA: Diagnosis not present

## 2017-02-08 DIAGNOSIS — Z79899 Other long term (current) drug therapy: Secondary | ICD-10-CM | POA: Diagnosis not present

## 2017-02-08 DIAGNOSIS — Z794 Long term (current) use of insulin: Secondary | ICD-10-CM | POA: Diagnosis not present

## 2017-02-08 MED ORDER — HEPARIN SOD (PORK) LOCK FLUSH 100 UNIT/ML IV SOLN
500.0000 [IU] | Freq: Once | INTRAVENOUS | Status: AC | PRN
  Administered 2017-02-08: 500 [IU]
  Filled 2017-02-08: qty 5

## 2017-02-12 DIAGNOSIS — Z8619 Personal history of other infectious and parasitic diseases: Secondary | ICD-10-CM | POA: Diagnosis not present

## 2017-02-12 DIAGNOSIS — C20 Malignant neoplasm of rectum: Secondary | ICD-10-CM | POA: Diagnosis not present

## 2017-02-12 DIAGNOSIS — E119 Type 2 diabetes mellitus without complications: Secondary | ICD-10-CM | POA: Diagnosis not present

## 2017-02-13 ENCOUNTER — Other Ambulatory Visit: Payer: Self-pay

## 2017-02-13 ENCOUNTER — Inpatient Hospital Stay: Payer: Medicare Other

## 2017-02-13 DIAGNOSIS — Z794 Long term (current) use of insulin: Secondary | ICD-10-CM | POA: Diagnosis not present

## 2017-02-13 DIAGNOSIS — R319 Hematuria, unspecified: Secondary | ICD-10-CM | POA: Diagnosis not present

## 2017-02-13 DIAGNOSIS — Z7982 Long term (current) use of aspirin: Secondary | ICD-10-CM | POA: Diagnosis not present

## 2017-02-13 DIAGNOSIS — C20 Malignant neoplasm of rectum: Secondary | ICD-10-CM | POA: Diagnosis not present

## 2017-02-13 DIAGNOSIS — Z5111 Encounter for antineoplastic chemotherapy: Secondary | ICD-10-CM | POA: Diagnosis not present

## 2017-02-13 DIAGNOSIS — Z79899 Other long term (current) drug therapy: Secondary | ICD-10-CM | POA: Diagnosis not present

## 2017-02-13 LAB — CBC WITH DIFFERENTIAL/PLATELET
BASOS ABS: 0 10*3/uL (ref 0–0.1)
BASOS PCT: 1 %
Eosinophils Absolute: 0.6 10*3/uL (ref 0–0.7)
Eosinophils Relative: 7 %
HEMATOCRIT: 35.6 % — AB (ref 40.0–52.0)
HEMOGLOBIN: 12.2 g/dL — AB (ref 13.0–18.0)
Lymphocytes Relative: 15 %
Lymphs Abs: 1.2 10*3/uL (ref 1.0–3.6)
MCH: 29.3 pg (ref 26.0–34.0)
MCHC: 34.3 g/dL (ref 32.0–36.0)
MCV: 85.3 fL (ref 80.0–100.0)
Monocytes Absolute: 0.4 10*3/uL (ref 0.2–1.0)
Monocytes Relative: 4 %
NEUTROS ABS: 6.2 10*3/uL (ref 1.4–6.5)
NEUTROS PCT: 73 %
Platelets: 299 10*3/uL (ref 150–440)
RBC: 4.18 MIL/uL — ABNORMAL LOW (ref 4.40–5.90)
RDW: 13.2 % (ref 11.5–14.5)
WBC: 8.4 10*3/uL (ref 3.8–10.6)

## 2017-02-13 LAB — COMPREHENSIVE METABOLIC PANEL
ALBUMIN: 3.4 g/dL — AB (ref 3.5–5.0)
ALK PHOS: 82 U/L (ref 38–126)
ALT: 22 U/L (ref 17–63)
AST: 27 U/L (ref 15–41)
Anion gap: 11 (ref 5–15)
BILIRUBIN TOTAL: 0.8 mg/dL (ref 0.3–1.2)
BUN: 29 mg/dL — AB (ref 6–20)
CALCIUM: 8.9 mg/dL (ref 8.9–10.3)
CO2: 22 mmol/L (ref 22–32)
Chloride: 101 mmol/L (ref 101–111)
Creatinine, Ser: 1.76 mg/dL — ABNORMAL HIGH (ref 0.61–1.24)
GFR calc Af Amer: 45 mL/min — ABNORMAL LOW (ref >60)
GFR calc non Af Amer: 39 mL/min — ABNORMAL LOW (ref >60)
GLUCOSE: 229 mg/dL — AB (ref 65–99)
Potassium: 3.9 mmol/L (ref 3.5–5.1)
Sodium: 134 mmol/L — ABNORMAL LOW (ref 135–145)
TOTAL PROTEIN: 7.4 g/dL (ref 6.5–8.1)

## 2017-02-15 ENCOUNTER — Telehealth: Payer: Self-pay | Admitting: Urology

## 2017-02-15 ENCOUNTER — Telehealth: Payer: Self-pay

## 2017-02-15 NOTE — Telephone Encounter (Signed)
Spoke w/ pharm per patient's daughter's request for a script of pre-filled 10cc saline flush syringes x3 daily #90 6 refills

## 2017-02-15 NOTE — Telephone Encounter (Signed)
Pt's daughter, Nira Conn, returned your call.  Please give her a call [phone]

## 2017-02-18 ENCOUNTER — Inpatient Hospital Stay (HOSPITAL_BASED_OUTPATIENT_CLINIC_OR_DEPARTMENT_OTHER): Payer: Medicare Other | Admitting: Internal Medicine

## 2017-02-18 ENCOUNTER — Inpatient Hospital Stay: Payer: Medicare Other

## 2017-02-18 VITALS — BP 112/76 | HR 86 | Temp 96.6°F | Resp 18 | Wt 247.4 lb

## 2017-02-18 DIAGNOSIS — R319 Hematuria, unspecified: Secondary | ICD-10-CM | POA: Diagnosis not present

## 2017-02-18 DIAGNOSIS — I2510 Atherosclerotic heart disease of native coronary artery without angina pectoris: Secondary | ICD-10-CM

## 2017-02-18 DIAGNOSIS — M545 Low back pain: Secondary | ICD-10-CM

## 2017-02-18 DIAGNOSIS — Z79899 Other long term (current) drug therapy: Secondary | ICD-10-CM

## 2017-02-18 DIAGNOSIS — I700 Atherosclerosis of aorta: Secondary | ICD-10-CM

## 2017-02-18 DIAGNOSIS — R590 Localized enlarged lymph nodes: Secondary | ICD-10-CM

## 2017-02-18 DIAGNOSIS — R1900 Intra-abdominal and pelvic swelling, mass and lump, unspecified site: Secondary | ICD-10-CM | POA: Diagnosis not present

## 2017-02-18 DIAGNOSIS — E1151 Type 2 diabetes mellitus with diabetic peripheral angiopathy without gangrene: Secondary | ICD-10-CM

## 2017-02-18 DIAGNOSIS — Z86718 Personal history of other venous thrombosis and embolism: Secondary | ICD-10-CM | POA: Diagnosis not present

## 2017-02-18 DIAGNOSIS — N289 Disorder of kidney and ureter, unspecified: Secondary | ICD-10-CM

## 2017-02-18 DIAGNOSIS — Z7982 Long term (current) use of aspirin: Secondary | ICD-10-CM

## 2017-02-18 DIAGNOSIS — C20 Malignant neoplasm of rectum: Secondary | ICD-10-CM

## 2017-02-18 DIAGNOSIS — N136 Pyonephrosis: Secondary | ICD-10-CM

## 2017-02-18 DIAGNOSIS — N189 Chronic kidney disease, unspecified: Secondary | ICD-10-CM

## 2017-02-18 DIAGNOSIS — G629 Polyneuropathy, unspecified: Secondary | ICD-10-CM | POA: Diagnosis not present

## 2017-02-18 DIAGNOSIS — R0602 Shortness of breath: Secondary | ICD-10-CM

## 2017-02-18 DIAGNOSIS — I6529 Occlusion and stenosis of unspecified carotid artery: Secondary | ICD-10-CM

## 2017-02-18 DIAGNOSIS — Z8041 Family history of malignant neoplasm of ovary: Secondary | ICD-10-CM

## 2017-02-18 DIAGNOSIS — Z8042 Family history of malignant neoplasm of prostate: Secondary | ICD-10-CM

## 2017-02-18 DIAGNOSIS — Z794 Long term (current) use of insulin: Secondary | ICD-10-CM

## 2017-02-18 DIAGNOSIS — Z5111 Encounter for antineoplastic chemotherapy: Secondary | ICD-10-CM | POA: Diagnosis not present

## 2017-02-18 DIAGNOSIS — J449 Chronic obstructive pulmonary disease, unspecified: Secondary | ICD-10-CM

## 2017-02-18 DIAGNOSIS — I129 Hypertensive chronic kidney disease with stage 1 through stage 4 chronic kidney disease, or unspecified chronic kidney disease: Secondary | ICD-10-CM | POA: Diagnosis not present

## 2017-02-18 DIAGNOSIS — R350 Frequency of micturition: Secondary | ICD-10-CM

## 2017-02-18 DIAGNOSIS — I739 Peripheral vascular disease, unspecified: Secondary | ICD-10-CM

## 2017-02-18 DIAGNOSIS — I509 Heart failure, unspecified: Secondary | ICD-10-CM

## 2017-02-18 DIAGNOSIS — M7989 Other specified soft tissue disorders: Secondary | ICD-10-CM

## 2017-02-18 DIAGNOSIS — Z803 Family history of malignant neoplasm of breast: Secondary | ICD-10-CM

## 2017-02-18 DIAGNOSIS — I4891 Unspecified atrial fibrillation: Secondary | ICD-10-CM

## 2017-02-18 DIAGNOSIS — Z7901 Long term (current) use of anticoagulants: Secondary | ICD-10-CM | POA: Diagnosis not present

## 2017-02-18 DIAGNOSIS — K760 Fatty (change of) liver, not elsewhere classified: Secondary | ICD-10-CM

## 2017-02-18 LAB — COMPREHENSIVE METABOLIC PANEL
ALBUMIN: 3.4 g/dL — AB (ref 3.5–5.0)
ALK PHOS: 80 U/L (ref 38–126)
ALT: 20 U/L (ref 17–63)
AST: 27 U/L (ref 15–41)
Anion gap: 8 (ref 5–15)
BILIRUBIN TOTAL: 1 mg/dL (ref 0.3–1.2)
BUN: 20 mg/dL (ref 6–20)
CO2: 25 mmol/L (ref 22–32)
CREATININE: 1.55 mg/dL — AB (ref 0.61–1.24)
Calcium: 9.4 mg/dL (ref 8.9–10.3)
Chloride: 103 mmol/L (ref 101–111)
GFR calc Af Amer: 52 mL/min — ABNORMAL LOW (ref >60)
GFR calc non Af Amer: 45 mL/min — ABNORMAL LOW (ref >60)
GLUCOSE: 212 mg/dL — AB (ref 65–99)
Potassium: 3.7 mmol/L (ref 3.5–5.1)
SODIUM: 136 mmol/L (ref 135–145)
Total Protein: 7.4 g/dL (ref 6.5–8.1)

## 2017-02-18 LAB — CBC WITH DIFFERENTIAL/PLATELET
BASOS ABS: 0.1 10*3/uL (ref 0–0.1)
BASOS PCT: 1 %
EOS PCT: 5 %
Eosinophils Absolute: 0.4 10*3/uL (ref 0–0.7)
HCT: 34 % — ABNORMAL LOW (ref 40.0–52.0)
Hemoglobin: 11.6 g/dL — ABNORMAL LOW (ref 13.0–18.0)
Lymphocytes Relative: 9 %
Lymphs Abs: 0.8 10*3/uL — ABNORMAL LOW (ref 1.0–3.6)
MCH: 29.5 pg (ref 26.0–34.0)
MCHC: 34.3 g/dL (ref 32.0–36.0)
MCV: 86 fL (ref 80.0–100.0)
MONO ABS: 0.8 10*3/uL (ref 0.2–1.0)
Monocytes Relative: 9 %
Neutro Abs: 7.1 10*3/uL — ABNORMAL HIGH (ref 1.4–6.5)
Neutrophils Relative %: 78 %
PLATELETS: 242 10*3/uL (ref 150–440)
RBC: 3.95 MIL/uL — AB (ref 4.40–5.90)
RDW: 14.2 % (ref 11.5–14.5)
WBC: 9.1 10*3/uL (ref 3.8–10.6)

## 2017-02-18 MED ORDER — PALONOSETRON HCL INJECTION 0.25 MG/5ML
0.2500 mg | Freq: Once | INTRAVENOUS | Status: AC
  Administered 2017-02-18: 0.25 mg via INTRAVENOUS
  Filled 2017-02-18: qty 5

## 2017-02-18 MED ORDER — SODIUM CHLORIDE 0.9 % IV SOLN
2400.0000 mg/m2 | INTRAVENOUS | Status: DC
  Administered 2017-02-18: 5700 mg via INTRAVENOUS
  Filled 2017-02-18: qty 114

## 2017-02-18 MED ORDER — FLUOROURACIL CHEMO INJECTION 2.5 GM/50ML
400.0000 mg/m2 | Freq: Once | INTRAVENOUS | Status: AC
  Administered 2017-02-18: 950 mg via INTRAVENOUS
  Filled 2017-02-18: qty 19

## 2017-02-18 MED ORDER — SODIUM CHLORIDE 0.9 % IV SOLN
Freq: Once | INTRAVENOUS | Status: AC
  Administered 2017-02-18: 11:00:00 via INTRAVENOUS
  Filled 2017-02-18: qty 1000

## 2017-02-18 MED ORDER — DEXAMETHASONE SODIUM PHOSPHATE 10 MG/ML IJ SOLN
10.0000 mg | Freq: Once | INTRAMUSCULAR | Status: AC
  Administered 2017-02-18: 10 mg via INTRAVENOUS
  Filled 2017-02-18: qty 1

## 2017-02-18 MED ORDER — DEXTROSE 5 % IV SOLN
950.0000 mg | Freq: Once | INTRAVENOUS | Status: AC
  Administered 2017-02-18: 950 mg via INTRAVENOUS
  Filled 2017-02-18: qty 47.5

## 2017-02-18 MED ORDER — DEXAMETHASONE SODIUM PHOSPHATE 10 MG/ML IJ SOLN
INTRAMUSCULAR | Status: AC
  Filled 2017-02-18: qty 1

## 2017-02-18 MED ORDER — SODIUM CHLORIDE 0.9 % IV SOLN: 10.0000 mg | Freq: Once | INTRAVENOUS | Status: DC

## 2017-02-18 MED ORDER — ATROPINE SULFATE 1 MG/ML IJ SOLN
0.5000 mg | Freq: Once | INTRAMUSCULAR | Status: AC | PRN
  Administered 2017-02-18: 0.5 mg via INTRAVENOUS
  Filled 2017-02-18: qty 1

## 2017-02-18 MED ORDER — IRINOTECAN HCL CHEMO INJECTION 100 MG/5ML
400.0000 mg | Freq: Once | INTRAVENOUS | Status: AC
  Administered 2017-02-18: 400 mg via INTRAVENOUS
  Filled 2017-02-18: qty 15

## 2017-02-18 NOTE — Assessment & Plan Note (Addendum)
#   RECURRENT RECTAL CANCER STAGE IV- [s/p LN Bx]; currently on 5FU infusion cycle #1; tolerated well.  # will plan to proceed with FOLFIRI today. Labs today reviewed;  acceptable for treatment today.   # Hematuria-likely secondary to bladder involvement by the tumor. Recommend stopping asprin 81mg /day; continue eliquis-given the extensive DVT. Check UA/culture. Referral to Dr. Stefani Dama per family request.   # Acute renal insufficiency/CKD- secondary to bilateral hydronephrosis from the underlying pelvic mass. S/p PNC bil. Renal function improving/stabilizing with creatinine at 1.7  # Back pain- secondary to pelvic mass/PCN; continue hydrocodone as needed  # Acute DVT Left -continue Eliquis.   # DM-2 ; on insulin/ amaryl; off metformin. On Lantus. Discussed the patient will have elevated blood sugars around the time of chemo-secondary to steroids. Recommend checking blood sugars at least 3 times a day. If elevated- report to PCP  # patient's daughter had concerns regarding lapse in his care regarding follow-up of his CT scan that was done in December 2017- that initially noted to have possible recurrence. Voices Concerns that he was not referred to oncology sooner.   # follow up in 2 weeks/labs;FOLFIRI/labs/CEA; 1 week-cbc/bmp.UA/culture.   Cc; Dr.L

## 2017-02-18 NOTE — Progress Notes (Signed)
[place] NOTE  Patient Care Team: Dion Body, MD as PCP - General (Family Medicine) Clent Jacks, RN as Registered Nurse  CHIEF COMPLAINTS/PURPOSE OF CONSULTATION: RECTAL CANCER  #  Oncology History   # 2012- RECTO-SIGMOID-poorly diff STAGE III CA [s/p neo-adj chemo-RT- poor response]; APR [residual ypT3; 5/8 LN positiveDr.Smith/Dr.Pandit]  S/p FOLFOX   # SEP 2019- Pre-sacral fluid/mass- increasing ~ 5-6 cm [increased from dec 2017; also NEW retroperitoneal lymph nodes; inguinal adenopathy]; SEP 2018- left supraclavicular; mediastinal; right hilar-retroperitoneal; pelvic mass/node; bilateral inguinal adenopathy; s/p right Ingiunal LNBx - RECURRENT CA [necrotic;insuff tissue for F-One]  # OCT 8th 2018-FOLFIRI [first cycle 5FU alone]; OCT 22nd FOLFIRI  # Acute renal insuff [s/p bil PCN]; hematuria- sec to bladder invol [? Contra-indication to avastin]  # LEFT LE DVT [? [facility]- Dr.Schneir]; Eliquis.   # CHF/COPD/CKD/ PN sec to [facility]  # Foundation One-PDL-1/ TPS- 0% [2012-rectal specimen]; pending     Rectal cancer ([place])    HISTORY OF PRESENTING ILLNESS:  Donald Townsend 66 y.o.  male  above History of recurrent metastatic rectal cancer currently on 5-FU chemotherapy is here for follow-up.   Patient noted to have continued blood when he urinates. There is no blood in the nephrostomy tubes. He continues to notice decreased output from the left compared to the right. He has been evaluated by urology- this has been attributed to his rectal malignancy involving the bladder. Patient/daughter interested in a different urology opinion.   Patient denies any fevers or chills. Denies any diarrhea. Denies any sores in the mouth.  No nausea no vomiting. Chronic mild back pain.    Patient denies any pain. He has chronic mild swelling of the left lower extremity. Chronic tingling and numbness of his bilateral lower extremities-not any worse.   ROS: A  complete 10 point review of system is done which is negative except mentioned above in history of present illness.   MEDICAL HISTORY:  Past Medical History:  Diagnosis Date  . A-fib ([place])   . Cancer [facility])    Colon  . CHF (congestive heart failure) ([place])   . Diabetes mellitus without complication ([place])   . Dyspnea   . Elevated lipids   . Hypertension   . PVD (peripheral vascular disease) ([place])     SURGICAL HISTORY: Past Surgical History:  Procedure Laterality Date  . COLON RESECTION     with colostomy  . COLONOSCOPY    . CYSTOSCOPY WITH STENT PLACEMENT Bilateral 01/18/2017   Procedure: CYSTOSCOPY WITH STENT PLACEMENT;  Surgeon: [name], MD;  Location: [facility] ORS;  Service: Urology;  Laterality: Bilateral;  . IR FLUORO GUIDE PORT INSERTION RIGHT  01/24/2017  . IR NEPHROSTOMY PLACEMENT LEFT  01/19/2017  . IR NEPHROSTOMY PLACEMENT RIGHT  01/19/2017  . IVC FILTER REMOVAL N/A 08/28/2016   Procedure: IVC Filter Removal;  Surgeon: Katha Cabal, MD;  Location: [place] CV LAB;  Service: Cardiovascular;  Laterality: N/A;  . LOWER EXTREMITY VENOGRAPHY Left 12/18/2016   Procedure: Lower Extremity Venography;  Surgeon: Katha Cabal, MD;  Location: [place] CV LAB;  Service: Cardiovascular;  Laterality: Left;  . PERIPHERAL VASCULAR CATHETERIZATION Left 05/08/2016   Procedure: Lower Extremity Venography;  Surgeon: Katha Cabal, MD;  Location: [place] CV LAB;  Service: Cardiovascular;  Laterality: Left;    SOCIAL HISTORY: [place]; retdOccupational psychologist; currently works part time. He lives alone. No smoking occasional alcohol. Social History   Social History  . Marital  status: Divorced    Spouse name: N/A  . Number of children: N/A  . Years of education: N/A   Occupational History  . Not on file.   Social History Main Topics  . Smoking status: Never Smoker  . Smokeless tobacco: Never Used  . Alcohol use No  . Drug use: No  . Sexual activity: Not on  file   Other Topics Concern  . Not on file   Social History Narrative  . No narrative on file    FAMILY HISTORY: Family History  Problem Relation Age of Onset  . Diabetes Mother   . Stroke Mother   . Prostate cancer Brother   . Cancer Sister        Breast cancer  . Kidney cancer Neg Hx   . Bladder Cancer Neg Hx     ALLERGIES:  has No Known Allergies.  MEDICATIONS:  Current Outpatient Prescriptions  Medication Sig Dispense Refill  . aspirin EC 81 MG tablet Take 81 mg by mouth daily.     . Continuous Blood Gluc Receiver (FREESTYLE LIBRE READER) DEVI Use 1 Units as directed. Check CBG's four times daily. Dx: E11.9    . Continuous Blood Gluc Sensor (FREESTYLE LIBRE SENSOR SYSTEM) MISC Use 1 Units as directed. Check CBG's four times daily. Dx: E11.9    . digoxin (LANOXIN) 0.25 MG tablet Take 0.25 mg by mouth daily.     [name] ELIQUIS 5 MG TABS tablet Take 5 mg by mouth every 12 (twelve) hours.  0  . furosemide (LASIX) 40 MG tablet Take 40 mg by mouth daily.   0  . glimepiride (AMARYL) 4 MG tablet Take 4 mg by mouth 2 (two) times daily.   0  . HYDROcodone-acetaminophen (NORCO/VICODIN) 5-325 MG tablet Take 1 tablet by mouth every 6 (six) hours as needed for moderate pain. 90 tablet 0  . insulin aspart (NOVOLOG) 100 UNIT/ML FlexPen Inject 3 Units into the skin 3 (three) times daily with meals.     [name] LANTUS SOLOSTAR 100 UNIT/ML Solostar Pen Inject 15 Units into the skin at bedtime.   0  . lidocaine-prilocaine (EMLA) cream Apply 1 application topically as needed. Apply generously over the Mediport 45 minutes prior to chemotherapy. 30 g 0  . losartan (COZAAR) 100 MG tablet take 178m by mouth once daily    . metoprolol tartrate (LOPRESSOR) 25 MG tablet Take 25 mg by mouth 2 (two) times daily.     . pravastatin (PRAVACHOL) 20 MG tablet take 247mby mouth daily at bedtime    . tamsulosin (FLOMAX) 0.4 MG CAPS capsule Take 0.4 mg by mouth daily.  0  . B-D ULTRAFINE III SHORT PEN 31G X 8 MM MISC      . feeding supplement, GLUCERNA SHAKE, (GLElmoLIQD Follow manufacturer instructions    . Lancets 30G MISC Use 1 Units as directed. Check CBG's up to twice daily. Dx: E11.9    . ondansetron (ZOFRAN) 8 MG tablet 1 pill every 8 hours for nausea/vomitting as needed (Patient not taking: Reported on 02/06/2017) 40 tablet 2  . prochlorperazine (COMPAZINE) 10 MG tablet Take 1 tablet (10 mg total) by mouth every 6 (six) hours as needed for nausea or vomiting. (Patient not taking: Reported on 02/18/2017) 40 tablet 1   No current facility-administered medications for this visit.       . Marland KitchenPHYSICAL EXAMINATION: ECOG PERFORMANCE STATUS: 0 - Asymptomatic  Vitals:   02/18/17 0839  BP: 112/76  Pulse: 86  Resp: 18  Temp: (!) 96.6 F (35.9 C)   Filed Weights   02/18/17 0839  Weight: 247 lb 6.4 oz (112.2 kg)    GENERAL: Well-nourished well-developed; Alert, no distress and comfortable.   With his daughter EYES: no pallor or icterus OROPHARYNX: no thrush or ulceration; good dentition  NECK: supple, no masses felt LYMPH:  no palpable lymphadenopathy in the cervical, axillary or inguinal regions LUNGS: clear to auscultation and  No wheeze or crackles HEART/CVS: regular rate & rhythm and no murmurs; No lower extremity edema ABDOMEN: abdomen soft, non-tender and normal bowel sounds; colostomy in place. Bilateral nephrostomy tubes in place. Musculoskeletal:no cyanosis of digits and no clubbing  PSYCH: alert & oriented x 3 with fluent speech NEURO: no focal motor/sensory deficits SKIN:  no rashes or significant lesions  LABORATORY DATA:  I have reviewed the data as listed Lab Results  Component Value Date   WBC 9.1 02/18/2017   HGB 11.6 (L) 02/18/2017   HCT 34.0 (L) 02/18/2017   MCV 86.0 02/18/2017   PLT 242 02/18/2017    Recent Labs  02/06/17 0820 02/13/17 1034 02/18/17 0802  NA 134* 134* 136  K 4.1 3.9 3.7  CL 99* 101 103  CO2 '23 22 25  ' GLUCOSE 219* 229* 212*  BUN 25*  29* 20  CREATININE 1.74* 1.76* 1.55*  CALCIUM 8.7* 8.9 9.4  GFRNONAA 39* 39* 45*  GFRAA 46* 45* 52*  PROT 7.4 7.4 7.4  ALBUMIN 3.3* 3.4* 3.4*  AST 33 27 27  ALT 43 22 20  ALKPHOS 107 82 80  BILITOT 0.5 0.8 1.0    RADIOGRAPHIC STUDIES: I have personally reviewed the radiological images as listed and agreed with the findings in the report. Dg Chest 2 View  Result Date: 01/31/2017 CLINICAL DATA:  Fever and chills, recent port placement and biopsy EXAM: CHEST  2 VIEW COMPARISON:  None. FINDINGS: Cardiac shadow is within normal limits. The lungs are well aerated bilaterally. No focal infiltrate or sizable effusion is seen. Right-sided chest wall port is noted in satisfactory position. No acute bony abnormality is noted. IMPRESSION: No active cardiopulmonary disease. Electronically Signed   By: [name] M.D.   On: 01/31/2017 16:26   Nm Pet Image Restag (ps) Skull Base To Thigh  Result Date: 01/22/2017 CLINICAL DATA:  Subsequent treatment strategy for followup of rectal cancer. EXAM: NUCLEAR MEDICINE PET SKULL BASE TO THIGH TECHNIQUE: 13.0 mCi F-18 FDG was injected intravenously. Full-ring PET imaging was performed from the skull base to thigh after the radiotracer. CT data was obtained and used for attenuation correction and anatomic localization. FASTING BLOOD GLUCOSE:  Value: 145 mg/dl COMPARISON:  CT 01/11/2017. FINDINGS: NECK: Low left jugular and supraclavicular hypermetabolic nodes. An index left supraclavicular nodal mass measures 1.7 cm and a S.U.V. max of 10.0 on image 69/series 3. Right-sided carotid atherosclerosis. Right maxillary mucous retention cyst or polyp. CHEST: High left mediastinal node measures 12 mm and a S.U.V. max of 4.6 on image 83/series 3. A right infrahilar node measures a S.U.V. max of 8.0 on image 100/ series 3. Not well visualized on CT. Right retrocrural node measures 8 mm and a S.U.V. max of 6.9 on image 146/series 3. Mild cardiomegaly with multivessel coronary  artery atherosclerosis. Pulmonary artery enlargement, 3.7 cm outflow tract. Mild centrilobular emphysema. ABDOMEN/PELVIS: Extensive hypermetabolic retroperitoneal adenopathy. Example left periaortic nodes measure up to 1.5 cm and a S.U.V. max of 16.5 on image 173/series 3. Bilateral pelvic sidewall adenopathy, with a right external  iliac node measuring 1.5 cm and a S.U.V. max of 23.2. Bilateral hypermetabolic inguinal nodes, with a index left inguinal node measuring 1.6 cm and a S.U.V. max of 6.2 on image 282/series 3. Heterogeneous soft tissue density about the pelvic cul-de-sac corresponds to hypermetabolism. This measures a S.U.V. max of 13.7, including image 20 of 51/series 3. This presumably represents a combination of prostate and surrounding recurrent tumor. Left adrenal hypermetabolism which is felt to correspond to minimal nodularity. This measures 11 mm and a S.U.V. max of 5.8 on image 155/series 3. Moderate hepatic steatosis. Placement of bilateral nephrostomy catheters. Small volume perinephric hematoma posteriorly on image 173/ series 3. Improved appearance of right greater than left hydronephrosis. Descending colostomy. Air within left renal collecting system and urinary bladder. Thick walled bladder is similar. SKELETON: Degenerative partial fusion of the bilateral sacroiliac joints. IMPRESSION: 1. Hypermetabolism corresponding to heterogeneous soft tissue density in the pelvic cul-de-sac. This likely represents local tumor recurrence, superimposed upon the prostate. 2. Nodal metastasis within the neck, chest, abdomen, and pelvis. 3. Placement of nephrostomy catheters, with improvement in bilateral hydronephrosis. Small volume right perinephric hematoma. 4. Minimal left adrenal nodularity and hypermetabolism, for which metastatic disease cannot be excluded. 5. Coronary artery atherosclerosis. Aortic Atherosclerosis (ICD10-I70.0). 6. Pulmonary artery enlargement suggests pulmonary arterial  hypertension. 7. Hepatic steatosis. 8. Air in the left renal collecting system is likely due to recent nephrostomy. Air within the bladder could be secondary. Similar thick walled bladder could be radiation induced. Infectious cystitis cannot be excluded. Electronically Signed   By: [name] M.D.   On: 01/22/2017 14:30   [place] Core Biopsy (lymph Nodes)  Addendum Date: 01/24/2017   ADDENDUM REPORT: 01/24/2017 15:34 ADDENDUM: Correction to the sedation medicines:  Fentanyl 25 mcg, Versed 1 mg Electronically Signed   By: [name] M.D.   On: 01/24/2017 15:34   Result Date: 01/24/2017 INDICATION: 66 year old with a history of rectal cancer. Patient now has evidence for diffuse metastatic disease including multiple enlarged lymph nodes. Tissue diagnosis is needed. EXAM: ULTRASOUND GUIDED CORE BIOPSY OF RIGHT INGUINAL LYMPH NODE MEDICATIONS: None. ANESTHESIA/SEDATION: Fentanyl 50 mcg IV; Versed 1 mg IV Moderate Sedation Time:  16 minutes The patient was continuously monitored during the procedure by the interventional radiology nurse under my direct supervision. PROCEDURE: The procedure, risks, benefits, and alternatives were explained to the patient. Questions regarding the procedure were encouraged and answered. The patient understands and consents to the procedure. Ultrasound demonstrated enlarged right inguinal lymph nodes. A slightly irregular hypoechoic lymph node was selected for biopsy. Right groin was prepped with chlorhexidine and sterile field was created. Skin was anesthetized with 1% lidocaine. Using ultrasound guidance, core biopsies were obtained within the right inguinal lymph node. Total of 6 core biopsies were obtained. Five biopsies were placed in formalin and 1 was placed on a Telfa pad. Bandage placed over the puncture site. COMPLICATIONS: None immediate. FINDINGS: Enlarged bilateral inguinal lymph nodes. Core biopsies obtained from a right inguinal lymph node. No significant bleeding or  hematoma formation following the core biopsies. IMPRESSION: Successful ultrasound-guided core biopsies of a right inguinal lymph node. Electronically Signed: By: [name] M.D. On: 01/24/2017 11:24   Ir Fluoro Guide Port Insertion Right  Result Date: 01/24/2017 INDICATION: 66 year old with history of rectal cancer and new lymphadenopathy. Port-A-Cath needed for treatment. EXAM: FLUOROSCOPIC AND ULTRASOUND GUIDED PLACEMENT OF A SUBCUTANEOUS PORT COMPARISON:  None. MEDICATIONS: Ancef 2 g; The antibiotic was administered within an appropriate time interval prior to skin puncture.  ANESTHESIA/SEDATION: Versed 2.0 mg IV; Fentanyl 75 mcg IV; Moderate Sedation Time:  30 minutes The patient was continuously monitored during the procedure by the interventional radiology nurse under my direct supervision. FLUOROSCOPY TIME:  30 seconds, 12 mGy COMPLICATIONS: None immediate. PROCEDURE: The procedure, risks, benefits, and alternatives were explained to the patient. Questions regarding the procedure were encouraged and answered. The patient understands and consents to the procedure. Patient was placed supine on the interventional table. Ultrasound confirmed a patent right internal jugular vein. The right chest and neck were cleaned with a skin antiseptic and a sterile drape was placed. Maximal barrier sterile technique was utilized including caps, mask, sterile gowns, sterile gloves, sterile drape, hand hygiene and skin antiseptic. The right neck was anesthetized with 1% lidocaine. Small incision was made in the right neck with a blade. Micropuncture set was placed in the right internal jugular vein with ultrasound guidance. The micropuncture wire was used for measurement purposes. The right chest was anesthetized with 1% lidocaine with epinephrine. #15 blade was used to make an incision and a subcutaneous port pocket was formed. [place] was assembled. Subcutaneous tunnel was formed with a stiff tunneling device.  The port catheter was brought through the subcutaneous tunnel. The port was placed in the subcutaneous pocket. The micropuncture set was exchanged for a peel-away sheath. The catheter was placed through the peel-away sheath and the tip was positioned at superior cavoatrial junction. Catheter placement was confirmed with fluoroscopy. The port was accessed and flushed with heparinized saline. The port pocket was closed using two layers of absorbable sutures and Dermabond. The vein skin site was closed using a single layer of absorbable suture and Dermabond. Sterile dressings were applied. Patient tolerated the procedure well without an immediate complication. Ultrasound and fluoroscopic images were taken and saved for this procedure. IMPRESSION: Placement of a subcutaneous port device. Electronically Signed   By: [name] M.D.   On: 01/24/2017 15:30    ASSESSMENT & PLAN:   Rectal cancer ([place]) # RECURRENT RECTAL CANCER STAGE IV- [s/p LN Bx]; currently on 5FU infusion cycle #1; tolerated well.  # will plan to proceed with FOLFIRI today. Labs today reviewed;  acceptable for treatment today.   # Hematuria-likely secondary to bladder involvement by the tumor. Recommend stopping asprin 19m/day; continue eliquis-given the extensive DVT. Check UA/culture. Referral to Dr. CStefani Damaper family request.   # Acute renal insufficiency/CKD- secondary to bilateral hydronephrosis from the underlying pelvic mass. S/p PNC bil. Renal function improving/stabilizing with creatinine at 1.7  # Back pain- secondary to pelvic mass/PCN; continue hydrocodone as needed  # Acute DVT Left -continue Eliquis.   # DM-2 ; on insulin/ amaryl; off metformin. On Lantus. Discussed the patient will have elevated blood sugars around the time of chemo-secondary to steroids. Recommend checking blood sugars at least 3 times a day. If elevated- report to PCP  # patient's daughter had concerns regarding lapse in his care regarding follow-up  of his CT scan that was done in December 2017- that initially noted to have possible recurrence. Voices Concerns that he was not referred to oncology sooner.   # follow up in 2 weeks/labs;FOLFIRI/labs/CEA; 1 week-cbc/bmp.UA/culture.   Cc; Dr.L  All questions were answered. The patient knows to call the clinic with any problems, questions or concerns.    GCammie Sickle MD 02/19/2017 8:24 AM

## 2017-02-18 NOTE — Progress Notes (Signed)
Pt here for follow up. Stated concern is bleeding urine w bright red blood -last time 10/21  Stated he has nephrostomy tubes. Went to see Dr Ivory Broad w this concern and told to f/u  In ER

## 2017-02-19 LAB — CEA: CEA: 160.8 ng/mL — ABNORMAL HIGH (ref 0.0–4.7)

## 2017-02-19 LAB — URINE CULTURE

## 2017-02-20 ENCOUNTER — Other Ambulatory Visit: Payer: PRIVATE HEALTH INSURANCE

## 2017-02-20 ENCOUNTER — Inpatient Hospital Stay: Payer: Medicare Other

## 2017-02-20 ENCOUNTER — Ambulatory Visit: Payer: PRIVATE HEALTH INSURANCE

## 2017-02-20 ENCOUNTER — Telehealth: Payer: Self-pay | Admitting: Internal Medicine

## 2017-02-20 ENCOUNTER — Ambulatory Visit: Payer: PRIVATE HEALTH INSURANCE | Admitting: Internal Medicine

## 2017-02-20 ENCOUNTER — Encounter: Payer: Self-pay | Admitting: Internal Medicine

## 2017-02-20 ENCOUNTER — Encounter: Payer: Self-pay | Admitting: Urology

## 2017-02-20 ENCOUNTER — Telehealth: Payer: Self-pay | Admitting: *Deleted

## 2017-02-20 ENCOUNTER — Ambulatory Visit (INDEPENDENT_AMBULATORY_CARE_PROVIDER_SITE_OTHER): Payer: Medicare Other | Admitting: Urology

## 2017-02-20 VITALS — BP 89/55 | HR 86 | Ht 70.0 in | Wt 250.0 lb

## 2017-02-20 VITALS — BP 140/80 | HR 84 | Temp 97.1°F | Resp 18

## 2017-02-20 DIAGNOSIS — R310 Gross hematuria: Secondary | ICD-10-CM

## 2017-02-20 DIAGNOSIS — Z79899 Other long term (current) drug therapy: Secondary | ICD-10-CM | POA: Diagnosis not present

## 2017-02-20 DIAGNOSIS — R319 Hematuria, unspecified: Secondary | ICD-10-CM | POA: Diagnosis not present

## 2017-02-20 DIAGNOSIS — C20 Malignant neoplasm of rectum: Secondary | ICD-10-CM

## 2017-02-20 DIAGNOSIS — Z7982 Long term (current) use of aspirin: Secondary | ICD-10-CM | POA: Diagnosis not present

## 2017-02-20 DIAGNOSIS — Z794 Long term (current) use of insulin: Secondary | ICD-10-CM | POA: Diagnosis not present

## 2017-02-20 DIAGNOSIS — N179 Acute kidney failure, unspecified: Secondary | ICD-10-CM

## 2017-02-20 DIAGNOSIS — N1330 Unspecified hydronephrosis: Secondary | ICD-10-CM | POA: Diagnosis not present

## 2017-02-20 DIAGNOSIS — Z5111 Encounter for antineoplastic chemotherapy: Secondary | ICD-10-CM | POA: Diagnosis not present

## 2017-02-20 MED ORDER — SODIUM CHLORIDE 0.9% FLUSH
10.0000 mL | INTRAVENOUS | Status: DC | PRN
  Administered 2017-02-20: 10 mL
  Filled 2017-02-20: qty 10

## 2017-02-20 MED ORDER — HEPARIN SOD (PORK) LOCK FLUSH 100 UNIT/ML IV SOLN
500.0000 [IU] | Freq: Once | INTRAVENOUS | Status: AC | PRN
  Administered 2017-02-20: 500 [IU]

## 2017-02-20 NOTE — Telephone Encounter (Signed)
Ordered repeat Foundation One testing on rectal resection specimen- 2012.

## 2017-02-20 NOTE — Progress Notes (Signed)
02/20/2017 4:24 PM   Donald Townsend 1950/07/11 [id number]  Referring provider: Dion Body, MD [facility] [place], [place] [id number]  No chief complaint on file.   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  At that time, he was noted to have significant distortion of the normal bladder architecture related to presumed infiltration of colon cancer invading into the bladder wall.  He presents to the office today accompanied by his daughter for ongoing bleeding from his penis.  He reports that since his cystoscopy, he has had intermittent episodes of small amount of blood from his penis including eraser tip sized clots.  He brings several pictures with him today.  His hemoglobin is remained relatively stable including labs drawn approximately 2 days ago.  His nephrostomy tubes are draining clear yellow urine.  He is on Eliquis for history of extensive DVT/A. fib.  He also takes prophylactic aspirin.  He does report that he is occasionally dizzy when he stands.  He continues to take Flomax despite urinary diversion.  He denies any fevers or chills.  He recently had admission for sepsis related to Klebsiella based on urine culture from nephrostomy tube.  Urine culture sent from a "voided" specimen days ago is growing mixed flora.  He is under the care of Dr. Rogue Bussing, currently undergoing treatment with [facility].  His CEA continues to rise, now up to 160.8.  PMH: Past Medical History:  Diagnosis Date  . A-fib ([place])   . Cancer [facility])    Colon  . CHF (congestive heart failure) ([place])   . Diabetes mellitus without complication ([place])   . Dyspnea   . Elevated lipids   . Hypertension   . PVD (peripheral vascular disease) [facility])     Surgical History: Past  Surgical History:  Procedure Laterality Date  . COLON RESECTION     with colostomy  . COLONOSCOPY    . CYSTOSCOPY WITH STENT PLACEMENT Bilateral 01/18/2017   Procedure: CYSTOSCOPY WITH STENT PLACEMENT;  Surgeon: [name], MD;  Location: [facility] ORS;  Service: Urology;  Laterality: Bilateral;  . IR FLUORO GUIDE PORT INSERTION RIGHT  01/24/2017  . IR NEPHROSTOMY PLACEMENT LEFT  01/19/2017  . IR NEPHROSTOMY PLACEMENT RIGHT  01/19/2017  . IVC FILTER REMOVAL N/A 08/28/2016   Procedure: IVC Filter Removal;  Surgeon: Katha Cabal, MD;  Location: [place] CV LAB;  Service: Cardiovascular;  Laterality: N/A;  . LOWER EXTREMITY VENOGRAPHY Left 12/18/2016   Procedure: Lower Extremity Venography;  Surgeon: Katha Cabal, MD;  Location: [place] CV LAB;  Service: Cardiovascular;  Laterality: Left;  . PERIPHERAL VASCULAR CATHETERIZATION Left 05/08/2016   Procedure: Lower Extremity Venography;  Surgeon: Katha Cabal, MD;  Location: [place] CV LAB;  Service: Cardiovascular;  Laterality: Left;    Home Medications:  Allergies as of 02/20/2017   No Known Allergies     Medication List       Accurate as of 02/20/17  4:24 PM. Always use your most recent med list.          B-D ULTRAFINE III SHORT PEN 31G X 8 MM Misc Generic drug:  Insulin Pen Needle   digoxin 0.25 MG tablet Commonly known as:  LANOXIN Take 0.25 mg by mouth daily.   ELIQUIS 5 MG Tabs tablet Generic drug:  apixaban Take 5 mg by  mouth every 12 (twelve) hours.   feeding supplement (GLUCERNA SHAKE) Liqd Follow manufacturer instructions   FREESTYLE LIBRE READER Devi Use 1 Units as directed. Check CBG's four times daily. Dx: E11.9   FREESTYLE LIBRE SENSOR SYSTEM Misc Use 1 Units as directed. Check CBG's four times daily. Dx: E11.9   furosemide 40 MG tablet Commonly known as:  LASIX Take 40 mg by mouth daily.   glimepiride 4 MG tablet Commonly known as:  AMARYL Take 4 mg by mouth 2 (two) times  daily.   HYDROcodone-acetaminophen 5-325 MG tablet Commonly known as:  NORCO/VICODIN Take 1 tablet by mouth every 6 (six) hours as needed for moderate pain.   insulin aspart 100 UNIT/ML FlexPen Commonly known as:  NOVOLOG Inject 3 Units into the skin 3 (three) times daily with meals.   Lancets 30G Misc Use 1 Units as directed. Check CBG's up to twice daily. Dx: E11.9   LANTUS SOLOSTAR 100 UNIT/ML Solostar Pen Generic drug:  Insulin Glargine Inject 15 Units into the skin at bedtime.   lidocaine-prilocaine cream Commonly known as:  EMLA Apply 1 application topically as needed. Apply generously over the Mediport 45 minutes prior to chemotherapy.   losartan 100 MG tablet Commonly known as:  COZAAR take 100mg  by mouth once daily   metoprolol tartrate 25 MG tablet Commonly known as:  LOPRESSOR Take 25 mg by mouth 2 (two) times daily.   ondansetron 8 MG tablet Commonly known as:  ZOFRAN 1 pill every 8 hours for nausea/vomitting as needed   pravastatin 20 MG tablet Commonly known as:  PRAVACHOL take 20mg  by mouth daily at bedtime   prochlorperazine 10 MG tablet Commonly known as:  COMPAZINE Take 1 tablet (10 mg total) by mouth every 6 (six) hours as needed for nausea or vomiting.   tamsulosin 0.4 MG Caps capsule Commonly known as:  FLOMAX Take 0.4 mg by mouth daily.       Allergies: No Known Allergies  Family History: Family History  Problem Relation Age of Onset  . Diabetes Mother   . Stroke Mother   . Prostate cancer Brother   . Cancer Sister        Breast cancer  . Kidney cancer Neg Hx   . Bladder Cancer Neg Hx     Social History:  reports that he has never smoked. He has never used smokeless tobacco. He reports that he does not drink alcohol or use drugs.  ROS: UROLOGY Frequent Urination?: No Hard to postpone urination?: No Burning/pain with urination?: No Get up at night to urinate?: No Leakage of urine?: No Urine stream starts and stops?:  No Trouble starting stream?: No Do you have to strain to urinate?: No Blood in urine?: Yes Urinary tract infection?: No Sexually transmitted disease?: No Injury to kidneys or bladder?: Yes Painful intercourse?: No Weak stream?: No Erection problems?: No Penile pain?: No  Gastrointestinal Nausea?: No Vomiting?: No Indigestion/heartburn?: No Diarrhea?: No Constipation?: No  Constitutional Fever: No Night sweats?: No Weight loss?: No Fatigue?: Yes  Skin Skin rash/lesions?: No Itching?: No  Eyes Blurred vision?: Yes Double vision?: No  Ears/Nose/Throat Sore throat?: No Sinus problems?: No  Hematologic/Lymphatic Swollen glands?: No Easy bruising?: Yes  Cardiovascular Leg swelling?: No Chest pain?: No  Respiratory Cough?: No Shortness of breath?: Yes  Endocrine Excessive thirst?: No  Musculoskeletal Back pain?: Yes Joint pain?: No  Neurological Headaches?: No Dizziness?: Yes  Psychologic Depression?: No Anxiety?: No  Physical Exam: BP (!) 89/55   Pulse 86  Ht 5\' 10"  (1.778 m)   Wt 250 lb (113.4 kg)   BMI 35.87 kg/m   Constitutional:  Alert and oriented, No acute distress.  Accompanied by daughter today. HEENT: [place] AT, moist mucus membranes.  Trachea midline, no masses. Cardiovascular: No clubbing, cyanosis, or edema. Respiratory: Normal respiratory effort, no increased work of breathing. GI: Abdomen is soft, nontender, nondistended, no abdominal masses GU:  Bilateral nephrostomy tubes in place, each draining clear yellow urine.   Skin: No rashes, bruises or suspicious lesions. Neurologic: Grossly intact, no focal deficits, moving all 4 extremities. Psychiatric: Normal mood and affect.  Laboratory Data: Lab Results  Component Value Date   WBC 9.1 02/18/2017   HGB 11.6 (L) 02/18/2017   HCT 34.0 (L) 02/18/2017   MCV 86.0 02/18/2017   PLT 242 02/18/2017    Lab Results  Component Value Date   CREATININE 1.55 (H) 02/18/2017    Lab  Results  Component Value Date   PSA1 0.2 01/01/2017    Lab Results  Component Value Date   HGBA1C 7.5 (H) 01/19/2017    Urinalysis N/a   Pertinent Imaging: CT hematuria workup in 01/11/2017 was reviewed personally with the patient.  Proximity of his pelvic tumor to his bladder was reviewed.  Assessment & Plan:    1. Gross hematuria Bleeding likely secondary to infiltrative bladder mass Discussed with Dr. Worthy Rancher- recommend palliative radiation, scheduled Oct 31 for rad/ onc consult Stop aspirin Hemoglobin relatively stable, bleeding although worrisome to patient is minimal and not severe  2. Rectal cancer ([place]) Stage IV, initiating chemotherapy CEA rising  3. AKI (acute kidney injury) ([place]) Creatinine stabilized, nadir ~1.5  4. Hydronephrosis, bilateral Secondary to obstructing pelvic tumor managed with bilateral nephrostomy tube Will arrange for exchange after chemotherapy completed, see again in December Decrease frequency of flushes as tube seem to be draining well  Discussed difference between colonization and active infection Advised to call with any nephrostomy tube care questions or concern for infection Role for Flomax limited given that he is no longer voiding spontaneously, DC this medication in light of low BP  F/u in Dec as scheduled  Hollice Espy, MD  [place] [address], [place], [place] [address] [phone]  I spent 25 min with this patient of which greater than 50% was spent in counseling and coordination of care with the patient.

## 2017-02-20 NOTE — Telephone Encounter (Signed)
Patient has an appt with Dr. Erlene Quan today at 4:00. Dr. Jacinto Reap, would you like to proceed with referral to Dr. Copes office?

## 2017-02-20 NOTE — Telephone Encounter (Signed)
Spoke to Dr.crystal re; pallaitive RT for hematuria. Please make referral to Dr.Crystal asap. Dx; rectal cancer/hematuria.

## 2017-02-20 NOTE — Telephone Encounter (Signed)
I called and spoke with Pam in Pathology.  Pam states that the specimen from 01/24/17 that was sent to [facility] was insufficient, so pathologist Delorse Lek submitted the specimen from 03/26/11, which also was insufficient. Dr. Rogue Bussing notified.

## 2017-02-20 NOTE — Telephone Encounter (Signed)
Patient has appt scheduled with Dr. Jacqlyn Larsen on 12/31 - Per Dr. Rogue Bussing leave appt scheduled and will talk with patient at next visit about referral.

## 2017-02-20 NOTE — Telephone Encounter (Addendum)
Called to report that Dr Cope's office did not get a referral from [place] as discussed at appointment. Please fax referral to [facility] [phone]

## 2017-02-20 NOTE — Telephone Encounter (Signed)
Colette, see below.

## 2017-02-21 ENCOUNTER — Telehealth: Payer: Self-pay | Admitting: Internal Medicine

## 2017-02-21 NOTE — Telephone Encounter (Signed)
Spoke to Dr.baker; kindly agrees to check on viable tumor specimen for [employer] testing.

## 2017-02-25 ENCOUNTER — Inpatient Hospital Stay: Payer: Medicare Other

## 2017-02-25 DIAGNOSIS — R319 Hematuria, unspecified: Secondary | ICD-10-CM | POA: Diagnosis not present

## 2017-02-25 DIAGNOSIS — C20 Malignant neoplasm of rectum: Secondary | ICD-10-CM

## 2017-02-25 DIAGNOSIS — Z5111 Encounter for antineoplastic chemotherapy: Secondary | ICD-10-CM | POA: Diagnosis not present

## 2017-02-25 DIAGNOSIS — Z7982 Long term (current) use of aspirin: Secondary | ICD-10-CM | POA: Diagnosis not present

## 2017-02-25 DIAGNOSIS — Z794 Long term (current) use of insulin: Secondary | ICD-10-CM | POA: Diagnosis not present

## 2017-02-25 DIAGNOSIS — Z79899 Other long term (current) drug therapy: Secondary | ICD-10-CM | POA: Diagnosis not present

## 2017-02-25 LAB — COMPREHENSIVE METABOLIC PANEL
ALK PHOS: 75 U/L (ref 38–126)
ALT: 17 U/L (ref 17–63)
AST: 26 U/L (ref 15–41)
Albumin: 3.4 g/dL — ABNORMAL LOW (ref 3.5–5.0)
Anion gap: 11 (ref 5–15)
BUN: 22 mg/dL — AB (ref 6–20)
CALCIUM: 9 mg/dL (ref 8.9–10.3)
CHLORIDE: 100 mmol/L — AB (ref 101–111)
CO2: 22 mmol/L (ref 22–32)
CREATININE: 1.61 mg/dL — AB (ref 0.61–1.24)
GFR calc Af Amer: 50 mL/min — ABNORMAL LOW (ref >60)
GFR, EST NON AFRICAN AMERICAN: 43 mL/min — AB (ref >60)
Glucose, Bld: 221 mg/dL — ABNORMAL HIGH (ref 65–99)
Potassium: 4 mmol/L (ref 3.5–5.1)
SODIUM: 133 mmol/L — AB (ref 135–145)
Total Bilirubin: 1.1 mg/dL (ref 0.3–1.2)
Total Protein: 7 g/dL (ref 6.5–8.1)

## 2017-02-25 LAB — CBC WITH DIFFERENTIAL/PLATELET
BASOS ABS: 0 10*3/uL (ref 0–0.1)
Basophils Relative: 1 %
EOS PCT: 10 %
Eosinophils Absolute: 0.4 10*3/uL (ref 0–0.7)
HCT: 31.6 % — ABNORMAL LOW (ref 40.0–52.0)
HEMOGLOBIN: 11 g/dL — AB (ref 13.0–18.0)
LYMPHS ABS: 0.9 10*3/uL — AB (ref 1.0–3.6)
LYMPHS PCT: 22 %
MCH: 29.5 pg (ref 26.0–34.0)
MCHC: 34.8 g/dL (ref 32.0–36.0)
MCV: 84.6 fL (ref 80.0–100.0)
Monocytes Absolute: 0.3 10*3/uL (ref 0.2–1.0)
Monocytes Relative: 7 %
NEUTROS ABS: 2.5 10*3/uL (ref 1.4–6.5)
NEUTROS PCT: 60 %
PLATELETS: 211 10*3/uL (ref 150–440)
RBC: 3.74 MIL/uL — AB (ref 4.40–5.90)
RDW: 14.1 % (ref 11.5–14.5)
WBC: 4.2 10*3/uL (ref 3.8–10.6)

## 2017-02-27 ENCOUNTER — Encounter: Payer: Self-pay | Admitting: Radiation Oncology

## 2017-02-27 ENCOUNTER — Ambulatory Visit
Admission: RE | Admit: 2017-02-27 | Discharge: 2017-02-27 | Disposition: A | Discharge status: Home / Self Care | Payer: Medicare Other | Source: Ambulatory Visit | Attending: Radiation Oncology | Admitting: Radiation Oncology

## 2017-02-27 VITALS — BP 144/77 | HR 86 | Temp 98.9°F | Resp 20 | Ht 70.5 in | Wt 245.6 lb

## 2017-02-27 DIAGNOSIS — Z7901 Long term (current) use of anticoagulants: Secondary | ICD-10-CM | POA: Diagnosis not present

## 2017-02-27 DIAGNOSIS — Z809 Family history of malignant neoplasm, unspecified: Secondary | ICD-10-CM | POA: Insufficient documentation

## 2017-02-27 DIAGNOSIS — Z9221 Personal history of antineoplastic chemotherapy: Secondary | ICD-10-CM | POA: Diagnosis not present

## 2017-02-27 DIAGNOSIS — R918 Other nonspecific abnormal finding of lung field: Secondary | ICD-10-CM | POA: Insufficient documentation

## 2017-02-27 DIAGNOSIS — C7911 Secondary malignant neoplasm of bladder: Secondary | ICD-10-CM | POA: Diagnosis not present

## 2017-02-27 DIAGNOSIS — Z510 Encounter for antineoplastic radiation therapy: Secondary | ICD-10-CM | POA: Diagnosis not present

## 2017-02-27 DIAGNOSIS — R319 Hematuria, unspecified: Secondary | ICD-10-CM | POA: Diagnosis not present

## 2017-02-27 DIAGNOSIS — Z794 Long term (current) use of insulin: Secondary | ICD-10-CM | POA: Insufficient documentation

## 2017-02-27 DIAGNOSIS — J449 Chronic obstructive pulmonary disease, unspecified: Secondary | ICD-10-CM | POA: Insufficient documentation

## 2017-02-27 DIAGNOSIS — I739 Peripheral vascular disease, unspecified: Secondary | ICD-10-CM | POA: Insufficient documentation

## 2017-02-27 DIAGNOSIS — Z923 Personal history of irradiation: Secondary | ICD-10-CM | POA: Diagnosis not present

## 2017-02-27 DIAGNOSIS — Z8042 Family history of malignant neoplasm of prostate: Secondary | ICD-10-CM | POA: Diagnosis not present

## 2017-02-27 DIAGNOSIS — Z933 Colostomy status: Secondary | ICD-10-CM | POA: Insufficient documentation

## 2017-02-27 DIAGNOSIS — N189 Chronic kidney disease, unspecified: Secondary | ICD-10-CM | POA: Insufficient documentation

## 2017-02-27 DIAGNOSIS — R0609 Other forms of dyspnea: Secondary | ICD-10-CM | POA: Insufficient documentation

## 2017-02-27 DIAGNOSIS — I509 Heart failure, unspecified: Secondary | ICD-10-CM | POA: Insufficient documentation

## 2017-02-27 DIAGNOSIS — Z86718 Personal history of other venous thrombosis and embolism: Secondary | ICD-10-CM | POA: Insufficient documentation

## 2017-02-27 DIAGNOSIS — E119 Type 2 diabetes mellitus without complications: Secondary | ICD-10-CM | POA: Diagnosis not present

## 2017-02-27 DIAGNOSIS — Z79899 Other long term (current) drug therapy: Secondary | ICD-10-CM | POA: Diagnosis not present

## 2017-02-27 DIAGNOSIS — E785 Hyperlipidemia, unspecified: Secondary | ICD-10-CM | POA: Diagnosis not present

## 2017-02-27 DIAGNOSIS — C20 Malignant neoplasm of rectum: Secondary | ICD-10-CM | POA: Insufficient documentation

## 2017-02-27 DIAGNOSIS — I129 Hypertensive chronic kidney disease with stage 1 through stage 4 chronic kidney disease, or unspecified chronic kidney disease: Secondary | ICD-10-CM | POA: Insufficient documentation

## 2017-02-27 DIAGNOSIS — I4891 Unspecified atrial fibrillation: Secondary | ICD-10-CM | POA: Diagnosis not present

## 2017-02-27 NOTE — Consult Note (Signed)
NEW PATIENT EVALUATION  Name: Donald Townsend  MRN: [id number]  Date:   02/27/2017     DOB: July 30, 1950   This 66 y.o. male patient presents to the clinic for initial evaluation of recurrent rectal cancer with hematuria and PET/CT evidence of significant burden in the pelvis encroaching and involving the bladder wall.[name]  REFERRING PHYSICIAN: Dion Body, MD  CHIEF COMPLAINT:  Chief Complaint  Patient presents with  . Cancer    Pt is here for initial consult  rectal cancer    DIAGNOSIS: The encounter diagnosis was Rectal cancer ([place]).   PREVIOUS INVESTIGATIONS:  PET CT scan and CT scans reviewed Prior pathology reports reviewed Clinical notes reviewed  HPI: Patient is a 66 year old male prior patient of ours treated back in 2012 when he presented with locally advanced stage III rectosigmoid adenocarcinoma. He was treated with neoadjuvant chemoradiation prior to surgical resection. At the time of resection he had residual T3 lesion with 5 of 8 lymph nodes positive for metastatic disease. Underwent FOLFOX chemotherapy. Recently he is presented with increasing presacral mass PET positive on review. He also has lung as well as inguinal pelvic left supraclavicular and mediastinal hypermetabolic lesions consistent with widespread metastatic disease. He has been treated with FOLFIRI since October 2018. He's also developed acute renal insufficiency has had nephrostomy tubes placed and continues to have hematuria most likely secondary to bladder involvement. He also has comorbidities including left lower extremity DVT congestive heart failure COPD. He's been seen with urology and again urology agrees with bleedings likely secondary to bladder wall invasion. I been asked to evaluate the patient for possible palliative radiation therapy to control some of the bleeding at this time. Patient is seen today and is doing fairly well continues to have hematuria. He has a diverting colostomy which is  functioning well.  PLANNED TREATMENT REGIMEN: Palliative radiation therapy to his pelvis  PAST MEDICAL HISTORY:  has a past medical history of A-fib ([place]); Cancer [facility]); CHF (congestive heart failure) ([place]); Diabetes mellitus without complication ([place]); Dyspnea; Elevated lipids; Hypertension; and PVD (peripheral vascular disease) ([place]).    PAST SURGICAL HISTORY:  Past Surgical History:  Procedure Laterality Date  . COLON RESECTION     with colostomy  . COLONOSCOPY    . CYSTOSCOPY WITH STENT PLACEMENT Bilateral 01/18/2017   Procedure: CYSTOSCOPY WITH STENT PLACEMENT;  Surgeon: [name], MD;  Location: [facility] ORS;  Service: Urology;  Laterality: Bilateral;  . IR FLUORO GUIDE PORT INSERTION RIGHT  01/24/2017  . IR NEPHROSTOMY PLACEMENT LEFT  01/19/2017  . IR NEPHROSTOMY PLACEMENT RIGHT  01/19/2017  . IVC FILTER REMOVAL N/A 08/28/2016   Procedure: IVC Filter Removal;  Surgeon: Katha Cabal, MD;  Location: [place] CV LAB;  Service: Cardiovascular;  Laterality: N/A;  . LOWER EXTREMITY VENOGRAPHY Left 12/18/2016   Procedure: Lower Extremity Venography;  Surgeon: Katha Cabal, MD;  Location: [place] CV LAB;  Service: Cardiovascular;  Laterality: Left;  . PERIPHERAL VASCULAR CATHETERIZATION Left 05/08/2016   Procedure: Lower Extremity Venography;  Surgeon: Katha Cabal, MD;  Location: [place] CV LAB;  Service: Cardiovascular;  Laterality: Left;    FAMILY HISTORY: family history includes Cancer in his sister; Diabetes in his mother; Prostate cancer in his brother; Stroke in his mother.  SOCIAL HISTORY:  reports that he has never smoked. He has never used smokeless tobacco. He reports that he does not drink alcohol or use drugs.  ALLERGIES: Patient has no known allergies.  MEDICATIONS:  Current  Outpatient Prescriptions  Medication Sig Dispense Refill  . B-D ULTRAFINE III SHORT PEN 31G X 8 MM MISC     . Continuous Blood Gluc Receiver (FREESTYLE LIBRE READER)  DEVI Use 1 Units as directed. Check CBG's four times daily. Dx: E11.9    . Continuous Blood Gluc Sensor (FREESTYLE LIBRE SENSOR SYSTEM) MISC Use 1 Units as directed. Check CBG's four times daily. Dx: E11.9    . digoxin (LANOXIN) 0.25 MG tablet Take 0.25 mg by mouth daily.     . feeding supplement, GLUCERNA SHAKE, ([place]) LIQD Follow manufacturer instructions    . furosemide (LASIX) 40 MG tablet Take 40 mg by mouth daily.   0  . glimepiride (AMARYL) 4 MG tablet Take 4 mg by mouth 2 (two) times daily.   0  . HYDROcodone-acetaminophen (NORCO/VICODIN) 5-325 MG tablet Take 1 tablet by mouth every 6 (six) hours as needed for moderate pain. 90 tablet 0  . insulin aspart (NOVOLOG) 100 UNIT/ML FlexPen Inject 3 Units into the skin 3 (three) times daily with meals.     . Lancets 30G MISC Use 1 Units as directed. Check CBG's up to twice daily. Dx: E11.9    . LANTUS SOLOSTAR 100 UNIT/ML Solostar Pen Inject 15 Units into the skin at bedtime.   0  . lidocaine-prilocaine (EMLA) cream Apply 1 application topically as needed. Apply generously over the Mediport 45 minutes prior to chemotherapy. 30 g 0  . losartan (COZAAR) 100 MG tablet take 100mg  by mouth once daily    . metoprolol tartrate (LOPRESSOR) 25 MG tablet Take 25 mg by mouth 2 (two) times daily.     . ondansetron (ZOFRAN) 8 MG tablet 1 pill every 8 hours for nausea/vomitting as needed 40 tablet 2  . pravastatin (PRAVACHOL) 20 MG tablet take 20mg  by mouth daily at bedtime    . prochlorperazine (COMPAZINE) 10 MG tablet Take 1 tablet (10 mg total) by mouth every 6 (six) hours as needed for nausea or vomiting. 40 tablet 1  . ELIQUIS 5 MG TABS tablet Take 5 mg by mouth every 12 (twelve) hours.  0   No current facility-administered medications for this encounter.     ECOG PERFORMANCE STATUS:  1 - Symptomatic but completely ambulatory  REVIEW OF SYSTEMS: Except for the hematuria some occasional sharp abdominal pain Patient denies any weight loss,  fatigue, weakness, fever, chills or night sweats. Patient denies any loss of vision, blurred vision. Patient denies any ringing  of the ears or hearing loss. No irregular heartbeat. Patient denies heart murmur or history of fainting. Patient denies any chest pain or pain radiating to her upper extremities. Patient denies any shortness of breath, difficulty breathing at night, cough or hemoptysis. Patient denies any swelling in the lower legs. Patient denies any nausea vomiting, vomiting of blood, or coffee ground material in the vomitus. Patient denies any stomach pain. Patient states has had normal bowel movements no significant constipation or diarrhea. Patient denies any dysuria, hematuria or significant nocturia. Patient denies any problems walking, swelling in the joints or loss of balance. Patient denies any skin changes, loss of hair or loss of weight. Patient denies any excessive worrying or anxiety or significant depression. Patient denies any problems with insomnia. Patient denies excessive thirst, polyuria, polydipsia. Patient denies any swollen glands, patient denies easy bruising or easy bleeding. Patient denies any recent infections, allergies or URI. Patient "s visual fields have not changed significantly in recent time.    PHYSICAL EXAM: BP [name])  144/77   Pulse 86   Temp 98.9 F (37.2 C)   Resp 20   Ht 5' 10.5" (1.791 m)   Wt 245 lb 9.5 oz (111.4 kg)   BMI 34.74 kg/m  Patient is a functioning colostomy. Well-developed well-nourished patient in NAD. HEENT reveals PERLA, EOMI, discs not visualized.  Oral cavity is clear. No oral mucosal lesions are identified. Neck is clear without evidence of cervical or supraclavicular adenopathy. Lungs are clear to A&P. Cardiac examination is essentially unremarkable with regular rate and rhythm without murmur rub or thrill. Abdomen is benign with no organomegaly or masses noted. Motor sensory and DTR levels are equal and symmetric in the upper and lower  extremities. Cranial nerves II through XII are grossly intact. Proprioception is intact. No peripheral adenopathy or edema is identified. No motor or sensory levels are noted. Crude visual fields are within normal range.  LABORATORY DATA: Pathology reports reviewed    RADIOLOGY RESULTS: PET CT scan reviewed   IMPRESSION: Recurrent stage IV rectal cancer with invasion of his bladder wall and progressed disease in his sacral space in 66 year old male with prior history of locally advanced adenocarcinoma of the rectum  PLAN: At this time I believe I can safely deliver palliative radiation therapy to his pelvis. I would start with a large field up to 2000 cGy over 2 weeks evaluate for response. I can also probably deliver another 2000 cGy to a smaller field to the area of marked hypermetabolic activity in the presacral space since there is no functioning bowel in this region. I have personally set up and ordered CT simulation for tomorrow.There will be extra effort by both professional staff as well as technical staff to coordinate and manage concurrent chemoradiation and ensuing side effects during his treatments. Risks and benefits of treatment including increased lower urinary tract symptoms fatigue alteration of blood counts skin reaction all were discussed in detail with the patient. We have also discussed retreatment of this area with radiation can lead to other additional problems with normal tissue tolerance. Patient seems to comprehend my treatment plan well.  I would like to take this opportunity to thank you for allowing me to participate in the care of your patient.Armstead Peaks., MD

## 2017-02-28 ENCOUNTER — Ambulatory Visit
Admission: RE | Admit: 2017-02-28 | Discharge: 2017-02-28 | Disposition: A | Discharge status: Home / Self Care | Payer: Medicare Other | Source: Ambulatory Visit | Attending: Radiation Oncology | Admitting: Radiation Oncology

## 2017-02-28 DIAGNOSIS — Z510 Encounter for antineoplastic radiation therapy: Secondary | ICD-10-CM | POA: Diagnosis not present

## 2017-02-28 DIAGNOSIS — R319 Hematuria, unspecified: Secondary | ICD-10-CM | POA: Diagnosis not present

## 2017-02-28 DIAGNOSIS — C7911 Secondary malignant neoplasm of bladder: Secondary | ICD-10-CM | POA: Diagnosis not present

## 2017-02-28 DIAGNOSIS — I509 Heart failure, unspecified: Secondary | ICD-10-CM | POA: Diagnosis not present

## 2017-02-28 DIAGNOSIS — R918 Other nonspecific abnormal finding of lung field: Secondary | ICD-10-CM | POA: Diagnosis not present

## 2017-02-28 DIAGNOSIS — C20 Malignant neoplasm of rectum: Secondary | ICD-10-CM | POA: Diagnosis not present

## 2017-03-04 DIAGNOSIS — C20 Malignant neoplasm of rectum: Secondary | ICD-10-CM | POA: Diagnosis not present

## 2017-03-04 DIAGNOSIS — Z510 Encounter for antineoplastic radiation therapy: Secondary | ICD-10-CM | POA: Diagnosis not present

## 2017-03-04 DIAGNOSIS — R918 Other nonspecific abnormal finding of lung field: Secondary | ICD-10-CM | POA: Diagnosis not present

## 2017-03-04 DIAGNOSIS — I509 Heart failure, unspecified: Secondary | ICD-10-CM | POA: Diagnosis not present

## 2017-03-04 DIAGNOSIS — C7911 Secondary malignant neoplasm of bladder: Secondary | ICD-10-CM | POA: Diagnosis not present

## 2017-03-04 DIAGNOSIS — R319 Hematuria, unspecified: Secondary | ICD-10-CM | POA: Diagnosis not present

## 2017-03-06 ENCOUNTER — Ambulatory Visit
Admission: RE | Admit: 2017-03-06 | Discharge: 2017-03-06 | Disposition: A | Discharge status: Home / Self Care | Payer: Medicare Other | Source: Ambulatory Visit | Attending: Radiation Oncology | Admitting: Radiation Oncology

## 2017-03-06 DIAGNOSIS — C7911 Secondary malignant neoplasm of bladder: Secondary | ICD-10-CM | POA: Diagnosis not present

## 2017-03-06 DIAGNOSIS — R918 Other nonspecific abnormal finding of lung field: Secondary | ICD-10-CM | POA: Diagnosis not present

## 2017-03-06 DIAGNOSIS — C20 Malignant neoplasm of rectum: Secondary | ICD-10-CM | POA: Diagnosis not present

## 2017-03-06 DIAGNOSIS — I509 Heart failure, unspecified: Secondary | ICD-10-CM | POA: Diagnosis not present

## 2017-03-06 DIAGNOSIS — Z510 Encounter for antineoplastic radiation therapy: Secondary | ICD-10-CM | POA: Diagnosis not present

## 2017-03-06 DIAGNOSIS — R319 Hematuria, unspecified: Secondary | ICD-10-CM | POA: Diagnosis not present

## 2017-03-07 ENCOUNTER — Ambulatory Visit
Admission: RE | Admit: 2017-03-07 | Discharge: 2017-03-07 | Disposition: A | Discharge status: Home / Self Care | Payer: Medicare Other | Source: Ambulatory Visit | Attending: Radiation Oncology | Admitting: Radiation Oncology

## 2017-03-07 DIAGNOSIS — C7911 Secondary malignant neoplasm of bladder: Secondary | ICD-10-CM | POA: Diagnosis not present

## 2017-03-07 DIAGNOSIS — C20 Malignant neoplasm of rectum: Secondary | ICD-10-CM | POA: Diagnosis not present

## 2017-03-07 DIAGNOSIS — R918 Other nonspecific abnormal finding of lung field: Secondary | ICD-10-CM | POA: Diagnosis not present

## 2017-03-07 DIAGNOSIS — R319 Hematuria, unspecified: Secondary | ICD-10-CM | POA: Diagnosis not present

## 2017-03-07 DIAGNOSIS — I509 Heart failure, unspecified: Secondary | ICD-10-CM | POA: Diagnosis not present

## 2017-03-07 DIAGNOSIS — Z510 Encounter for antineoplastic radiation therapy: Secondary | ICD-10-CM | POA: Diagnosis not present

## 2017-03-08 ENCOUNTER — Ambulatory Visit: Payer: Medicare Other

## 2017-03-08 ENCOUNTER — Ambulatory Visit
Admission: RE | Admit: 2017-03-08 | Discharge: 2017-03-08 | Disposition: A | Discharge status: Home / Self Care | Payer: Medicare Other | Source: Ambulatory Visit | Attending: Radiation Oncology | Admitting: Radiation Oncology

## 2017-03-08 DIAGNOSIS — R319 Hematuria, unspecified: Secondary | ICD-10-CM | POA: Diagnosis not present

## 2017-03-08 DIAGNOSIS — C7911 Secondary malignant neoplasm of bladder: Secondary | ICD-10-CM | POA: Diagnosis not present

## 2017-03-08 DIAGNOSIS — C20 Malignant neoplasm of rectum: Secondary | ICD-10-CM | POA: Diagnosis not present

## 2017-03-08 DIAGNOSIS — Z510 Encounter for antineoplastic radiation therapy: Secondary | ICD-10-CM | POA: Diagnosis not present

## 2017-03-08 DIAGNOSIS — R918 Other nonspecific abnormal finding of lung field: Secondary | ICD-10-CM | POA: Diagnosis not present

## 2017-03-08 DIAGNOSIS — I509 Heart failure, unspecified: Secondary | ICD-10-CM | POA: Diagnosis not present

## 2017-03-09 ENCOUNTER — Encounter: Payer: Self-pay | Admitting: Urology

## 2017-03-11 ENCOUNTER — Ambulatory Visit: Payer: Medicare Other

## 2017-03-11 ENCOUNTER — Encounter: Payer: Self-pay | Admitting: Urology

## 2017-03-11 ENCOUNTER — Ambulatory Visit (INDEPENDENT_AMBULATORY_CARE_PROVIDER_SITE_OTHER): Payer: Medicare Other | Admitting: Urology

## 2017-03-11 ENCOUNTER — Ambulatory Visit
Admission: RE | Admit: 2017-03-11 | Discharge: 2017-03-11 | Disposition: A | Discharge status: Home / Self Care | Payer: Medicare Other | Source: Ambulatory Visit | Attending: Radiation Oncology | Admitting: Radiation Oncology

## 2017-03-11 VITALS — BP 104/70 | HR 82 | Ht 71.0 in | Wt 250.3 lb

## 2017-03-11 DIAGNOSIS — N1330 Unspecified hydronephrosis: Secondary | ICD-10-CM | POA: Diagnosis not present

## 2017-03-11 DIAGNOSIS — R310 Gross hematuria: Secondary | ICD-10-CM | POA: Diagnosis not present

## 2017-03-11 DIAGNOSIS — N179 Acute kidney failure, unspecified: Secondary | ICD-10-CM

## 2017-03-11 DIAGNOSIS — R238 Other skin changes: Secondary | ICD-10-CM | POA: Diagnosis not present

## 2017-03-11 DIAGNOSIS — C7911 Secondary malignant neoplasm of bladder: Secondary | ICD-10-CM | POA: Diagnosis not present

## 2017-03-11 DIAGNOSIS — C20 Malignant neoplasm of rectum: Secondary | ICD-10-CM

## 2017-03-11 DIAGNOSIS — R369 Urethral discharge, unspecified: Secondary | ICD-10-CM

## 2017-03-11 NOTE — Telephone Encounter (Signed)
Patient is coming in today to see Donald Townsend since Erlene Quan is in surgery.   Sharyn Lull

## 2017-03-11 NOTE — Patient Instructions (Signed)
Please wash the sites with regular soap and water after dressings are removed.  Make sure it is thoroughly dried before applying the dressing.

## 2017-03-11 NOTE — Progress Notes (Signed)
03/11/2017 12:11 PM   Donald Townsend Sep 12, 1950 [id number]  Referring provider: Dion Body, MD [name] [place], [name] [address]  No chief complaint on file.   HPI: [name] presents today requesting an urgent appointment as he is having issues with his nephrostomy tubes.  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  At that time, he was noted to have significant distortion of the normal bladder architecture related to presumed infiltration of colon cancer invading into the bladder wall.  He presents to the office today accompanied by his daughter for ongoing bleeding from his penis.  He reports that since his cystoscopy, he has had intermittent episodes of small amount of blood from his penis including eraser tip sized clots.  He brings several pictures with him today.  His hemoglobin is remained relatively stable including labs drawn approximately 2 days ago.  His nephrostomy tubes are draining clear yellow urine.  He is on Eliquis for history of extensive DVT/A. fib.  He also takes prophylactic aspirin.  He does report that he is occasionally dizzy when he stands.  He continues to take Flomax despite urinary diversion.  He denies any fevers or chills.  He recently had admission for sepsis related to Klebsiella based on urine culture from nephrostomy tube.  Urine culture sent from a "voided" specimen days ago is growing mixed flora.  He is under the care of Dr. Rogue Bussing, currently undergoing treatment with [facility].  His CEA continues to rise, now up to 160.8.  Patient has requested a second opinion regarding his urological condition and has an upcoming appointment with Dr. Jacqlyn Larsen on 04/29/2017.    He has been evaluated by Dr. Donella Stade and is  receiving palliative radiation.  Today, he is feeling pain in the nephrostomy tube sites upon movement.  His daughter has also noticed some redness in the insertion sites.  He is also have a milky discharge from the penis.  He states he has not noticed any odor with the discharge.  He has not had fevers, chills, nausea or vomiting.  He has not had dysuria, gross hematuria or suprapubic pain.  His urine from the nephrostomy tubes is clear yellow.     PMH: Past Medical History:  Diagnosis Date  . A-fib ([facility])   . Cancer [facility])    Colon  . CHF (congestive heart failure) ([place])   . Diabetes mellitus without complication ([place])   . Dyspnea   . Elevated lipids   . Hypertension   . PVD (peripheral vascular disease) [facility])     Surgical History: Past Surgical History:  Procedure Laterality Date  . COLON RESECTION     with colostomy  . COLONOSCOPY    . IR FLUORO GUIDE PORT INSERTION RIGHT  01/24/2017  . IR NEPHROSTOMY PLACEMENT LEFT  01/19/2017  . IR NEPHROSTOMY PLACEMENT RIGHT  01/19/2017    Home Medications:  Allergies as of 03/11/2017   No Known Allergies     Medication List        Accurate as of 03/11/17 12:11 PM. Always use your most recent med list.          B-D ULTRAFINE III SHORT PEN 31G X 8 MM Misc Generic drug:  Insulin Pen Needle   digoxin 0.25 MG tablet Commonly known as:  LANOXIN Take 0.25 mg by mouth daily.  ELIQUIS 5 MG Tabs tablet Generic drug:  apixaban Take 5 mg by mouth every 12 (twelve) hours.   feeding supplement (GLUCERNA SHAKE) Liqd Follow manufacturer instructions   FREESTYLE LIBRE READER Devi Use 1 Units as directed. Check CBG's four times daily. Dx: E11.9   FREESTYLE LIBRE SENSOR SYSTEM Misc Use 1 Units as directed. Check CBG's four times daily. Dx: E11.9   furosemide 40 MG tablet Commonly known as:  LASIX Take 40 mg by mouth daily.   glimepiride 4 MG tablet Commonly known as:  AMARYL Take 4 mg by mouth 2 (two) times daily.     HYDROcodone-acetaminophen 5-325 MG tablet Commonly known as:  NORCO/VICODIN Take 1 tablet by mouth every 6 (six) hours as needed for moderate pain.   insulin aspart 100 UNIT/ML FlexPen Commonly known as:  NOVOLOG Inject 3 Units into the skin 3 (three) times daily with meals.   Lancets 30G Misc Use 1 Units as directed. Check CBG's up to twice daily. Dx: E11.9   LANTUS SOLOSTAR 100 UNIT/ML Solostar Pen Generic drug:  Insulin Glargine Inject 15 Units into the skin at bedtime.   lidocaine-prilocaine cream Commonly known as:  EMLA Apply 1 application topically as needed. Apply generously over the Mediport 45 minutes prior to chemotherapy.   losartan 100 MG tablet Commonly known as:  COZAAR take 100mg  by mouth once daily   metoprolol tartrate 25 MG tablet Commonly known as:  LOPRESSOR Take 25 mg by mouth 2 (two) times daily.   ondansetron 8 MG tablet Commonly known as:  ZOFRAN 1 pill every 8 hours for nausea/vomitting as needed   pravastatin 20 MG tablet Commonly known as:  PRAVACHOL take 20mg  by mouth daily at bedtime   prochlorperazine 10 MG tablet Commonly known as:  COMPAZINE Take 1 tablet (10 mg total) by mouth every 6 (six) hours as needed for nausea or vomiting.       Allergies: No Known Allergies  Family History: Family History  Problem Relation Age of Onset  . Diabetes Mother   . Stroke Mother   . Prostate cancer Brother   . Cancer Sister        Breast cancer  . Kidney cancer Neg Hx   . Bladder Cancer Neg Hx     Social History:  reports that  has never smoked. he has never used smokeless tobacco. He reports that he does not drink alcohol or use drugs.  ROS: UROLOGY Frequent Urination?: No Hard to postpone urination?: No Burning/pain with urination?: No Get up at night to urinate?: No Leakage of urine?: No Urine stream starts and stops?: No Trouble starting stream?: No Do you have to strain to urinate?: No Blood in urine?: Yes Urinary tract  infection?: No Sexually transmitted disease?: No Injury to kidneys or bladder?: No Painful intercourse?: No Weak stream?: No Erection problems?: No Penile pain?: No  Gastrointestinal Nausea?: No Vomiting?: No Indigestion/heartburn?: No Diarrhea?: No Constipation?: No  Constitutional Fever: No Night sweats?: Yes Weight loss?: No Fatigue?: Yes  Skin Skin rash/lesions?: No Itching?: No  Eyes Blurred vision?: No Double vision?: No  Ears/Nose/Throat Sore throat?: No Sinus problems?: No  Hematologic/Lymphatic Swollen glands?: No Easy bruising?: No  Cardiovascular Leg swelling?: No Chest pain?: No  Respiratory Cough?: No Shortness of breath?: No  Endocrine Excessive thirst?: No  Musculoskeletal Back pain?: No Joint pain?: No  Neurological Headaches?: No Dizziness?: No  Psychologic Depression?: No Anxiety?: No  Physical Exam: BP 104/70   Pulse 82   Ht 5\' 11"  (  1.803 m)   Wt 250 lb 4.8 oz (113.5 kg)   BMI 34.91 kg/m   Constitutional:  Alert and oriented, No acute distress.   HEENT: Colon AT, moist mucus membranes.  Trachea midline, no masses. Cardiovascular: No clubbing, cyanosis, or edema. Respiratory: Normal respiratory effort, no increased work of breathing. GI: Abdomen is soft, nontender, nondistended, no abdominal masses GU:  Bilateral nephrostomy tubes in place, each draining clear yellow urine.  Scant amount of cloudy drainage from urethra.   Skin: No rashes, bruises or suspicious lesions.  Nephrostomy tube dressing is removed.  Sites are clean and dry.  Redness is due to suture irritation.  No pain when palpating the sites and no pus expelled with palpation.  Dressing is replaced.   Neurologic: Grossly intact, no focal deficits, moving all 4 extremities. Psychiatric: Normal mood and affect.  Laboratory Data: Lab Results  Component Value Date   WBC 4.2 02/25/2017   HGB 11.0 (L) 02/25/2017   HCT 31.6 (L) 02/25/2017   MCV 84.6 02/25/2017    PLT 211 02/25/2017    Lab Results  Component Value Date   CREATININE 1.61 (H) 02/25/2017    Lab Results  Component Value Date   PSA1 0.2 01/01/2017    Lab Results  Component Value Date   HGBA1C 7.5 (H) 01/19/2017    Urinalysis N/a   Pertinent Imaging: CT hematuria workup in 01/11/2017 was reviewed personally with the patient.  Proximity of his pelvic tumor to his bladder was reviewed.  Assessment & Plan:    1.  Irritation of skin  - reassured the patient that there is no sign of infection at the tube sites  2. Gross hematuria Bleeding likely secondary to infiltrative bladder mass Discussed with Dr. Worthy Rancher- receiving palliative radiation Stop aspirin Hemoglobin relatively stable, bleeding although worrisome to patient is minimal and not severe - no recent hematuria  2. Rectal cancer ([place]) Stage IV, initiating chemotherapy CEA rising  3. AKI (acute kidney injury) ([place]) Creatinine stabilized, nadir ~1.5  4. Hydronephrosis, bilateral Secondary to obstructing pelvic tumor managed with bilateral nephrostomy tube Will arrange for exchange after chemotherapy completed, see again in December Decrease frequency of flushes as tube seem to be draining well  Reviewed difference between colonization and active infection Advised to call with any nephrostomy tube care questions or concern for infection Flomax has been discontinued   5. Penile discharge  - explained to the patient that because he is not having urine "washing out the bladder" it is most likely the accumulation of debris that he is seeing leaking out of his penis   - he needs to alert [place] if he starts to have a foul odor, suprapubic pain, fevers and/or chills with the discharge  F/u in Dec as scheduled  Zara Council, PA-C  [place] [address], [place], [place] [address] [phone]  I spent 25 min with this patient of which greater than 50% was  spent in counseling and coordination of care with the patient.

## 2017-03-12 ENCOUNTER — Ambulatory Visit
Admission: RE | Admit: 2017-03-12 | Discharge: 2017-03-12 | Disposition: A | Discharge status: Home / Self Care | Payer: Medicare Other | Source: Ambulatory Visit | Attending: Radiation Oncology | Admitting: Radiation Oncology

## 2017-03-12 ENCOUNTER — Ambulatory Visit: Payer: Medicare Other

## 2017-03-12 DIAGNOSIS — C7911 Secondary malignant neoplasm of bladder: Secondary | ICD-10-CM | POA: Diagnosis not present

## 2017-03-12 DIAGNOSIS — C20 Malignant neoplasm of rectum: Secondary | ICD-10-CM | POA: Diagnosis not present

## 2017-03-13 ENCOUNTER — Ambulatory Visit: Payer: Medicare Other

## 2017-03-13 ENCOUNTER — Ambulatory Visit
Admission: RE | Admit: 2017-03-13 | Discharge: 2017-03-13 | Disposition: A | Discharge status: Home / Self Care | Payer: Medicare Other | Source: Ambulatory Visit | Attending: Radiation Oncology | Admitting: Radiation Oncology

## 2017-03-13 DIAGNOSIS — C7911 Secondary malignant neoplasm of bladder: Secondary | ICD-10-CM | POA: Diagnosis not present

## 2017-03-13 DIAGNOSIS — C20 Malignant neoplasm of rectum: Secondary | ICD-10-CM | POA: Diagnosis not present

## 2017-03-14 ENCOUNTER — Inpatient Hospital Stay
Admission: EM | Admit: 2017-03-14 | Discharge: 2017-03-17 | DRG: 698 | Disposition: A | Discharge status: Home / Self Care | Payer: Medicare Other | Attending: Internal Medicine | Admitting: Internal Medicine

## 2017-03-14 ENCOUNTER — Emergency Department: Payer: Medicare Other

## 2017-03-14 ENCOUNTER — Encounter: Payer: Self-pay | Admitting: Internal Medicine

## 2017-03-14 ENCOUNTER — Ambulatory Visit: Payer: Medicare Other

## 2017-03-14 ENCOUNTER — Telehealth: Payer: Self-pay | Admitting: *Deleted

## 2017-03-14 ENCOUNTER — Encounter: Payer: Self-pay | Admitting: Emergency Medicine

## 2017-03-14 ENCOUNTER — Other Ambulatory Visit: Payer: Self-pay

## 2017-03-14 ENCOUNTER — Ambulatory Visit: Admission: RE | Admit: 2017-03-14 | Payer: Medicare Other | Source: Ambulatory Visit

## 2017-03-14 ENCOUNTER — Telehealth: Payer: Self-pay

## 2017-03-14 ENCOUNTER — Encounter: Payer: Self-pay | Admitting: Urology

## 2017-03-14 ENCOUNTER — Inpatient Hospital Stay: Payer: Medicare Other

## 2017-03-14 DIAGNOSIS — Z436 Encounter for attention to other artificial openings of urinary tract: Secondary | ICD-10-CM | POA: Diagnosis not present

## 2017-03-14 DIAGNOSIS — T83512A Infection and inflammatory reaction due to nephrostomy catheter, initial encounter: Secondary | ICD-10-CM | POA: Diagnosis not present

## 2017-03-14 DIAGNOSIS — Y848 Other medical procedures as the cause of abnormal reaction of the patient, or of later complication, without mention of misadventure at the time of the procedure: Secondary | ICD-10-CM | POA: Diagnosis present

## 2017-03-14 DIAGNOSIS — E1151 Type 2 diabetes mellitus with diabetic peripheral angiopathy without gangrene: Secondary | ICD-10-CM | POA: Diagnosis present

## 2017-03-14 DIAGNOSIS — Z86718 Personal history of other venous thrombosis and embolism: Secondary | ICD-10-CM

## 2017-03-14 DIAGNOSIS — I110 Hypertensive heart disease with heart failure: Secondary | ICD-10-CM | POA: Diagnosis present

## 2017-03-14 DIAGNOSIS — C20 Malignant neoplasm of rectum: Secondary | ICD-10-CM | POA: Diagnosis not present

## 2017-03-14 DIAGNOSIS — Z7901 Long term (current) use of anticoagulants: Secondary | ICD-10-CM

## 2017-03-14 DIAGNOSIS — N1330 Unspecified hydronephrosis: Secondary | ICD-10-CM | POA: Diagnosis not present

## 2017-03-14 DIAGNOSIS — A419 Sepsis, unspecified organism: Secondary | ICD-10-CM

## 2017-03-14 DIAGNOSIS — C7989 Secondary malignant neoplasm of other specified sites: Secondary | ICD-10-CM | POA: Diagnosis present

## 2017-03-14 DIAGNOSIS — I10 Essential (primary) hypertension: Secondary | ICD-10-CM | POA: Diagnosis not present

## 2017-03-14 DIAGNOSIS — Z794 Long term (current) use of insulin: Secondary | ICD-10-CM | POA: Diagnosis not present

## 2017-03-14 DIAGNOSIS — N12 Tubulo-interstitial nephritis, not specified as acute or chronic: Secondary | ICD-10-CM | POA: Diagnosis present

## 2017-03-14 DIAGNOSIS — I739 Peripheral vascular disease, unspecified: Secondary | ICD-10-CM | POA: Diagnosis not present

## 2017-03-14 DIAGNOSIS — Z8042 Family history of malignant neoplasm of prostate: Secondary | ICD-10-CM | POA: Diagnosis not present

## 2017-03-14 DIAGNOSIS — Z923 Personal history of irradiation: Secondary | ICD-10-CM | POA: Diagnosis not present

## 2017-03-14 DIAGNOSIS — E119 Type 2 diabetes mellitus without complications: Secondary | ICD-10-CM | POA: Diagnosis not present

## 2017-03-14 DIAGNOSIS — E785 Hyperlipidemia, unspecified: Secondary | ICD-10-CM | POA: Diagnosis not present

## 2017-03-14 DIAGNOSIS — I509 Heart failure, unspecified: Secondary | ICD-10-CM | POA: Diagnosis present

## 2017-03-14 DIAGNOSIS — Z823 Family history of stroke: Secondary | ICD-10-CM

## 2017-03-14 DIAGNOSIS — K529 Noninfective gastroenteritis and colitis, unspecified: Secondary | ICD-10-CM | POA: Diagnosis present

## 2017-03-14 DIAGNOSIS — Z833 Family history of diabetes mellitus: Secondary | ICD-10-CM

## 2017-03-14 DIAGNOSIS — A4189 Other specified sepsis: Secondary | ICD-10-CM | POA: Diagnosis present

## 2017-03-14 DIAGNOSIS — I480 Paroxysmal atrial fibrillation: Secondary | ICD-10-CM | POA: Diagnosis present

## 2017-03-14 DIAGNOSIS — N390 Urinary tract infection, site not specified: Secondary | ICD-10-CM | POA: Diagnosis not present

## 2017-03-14 DIAGNOSIS — Z803 Family history of malignant neoplasm of breast: Secondary | ICD-10-CM | POA: Diagnosis not present

## 2017-03-14 DIAGNOSIS — N131 Hydronephrosis with ureteral stricture, not elsewhere classified: Secondary | ICD-10-CM | POA: Diagnosis present

## 2017-03-14 DIAGNOSIS — Z9221 Personal history of antineoplastic chemotherapy: Secondary | ICD-10-CM | POA: Diagnosis not present

## 2017-03-14 DIAGNOSIS — I4891 Unspecified atrial fibrillation: Secondary | ICD-10-CM | POA: Diagnosis not present

## 2017-03-14 DIAGNOSIS — N10 Acute tubulo-interstitial nephritis: Secondary | ICD-10-CM | POA: Diagnosis not present

## 2017-03-14 DIAGNOSIS — Z933 Colostomy status: Secondary | ICD-10-CM | POA: Diagnosis not present

## 2017-03-14 DIAGNOSIS — R509 Fever, unspecified: Secondary | ICD-10-CM | POA: Diagnosis not present

## 2017-03-14 DIAGNOSIS — R0609 Other forms of dyspnea: Secondary | ICD-10-CM | POA: Diagnosis not present

## 2017-03-14 DIAGNOSIS — C7911 Secondary malignant neoplasm of bladder: Secondary | ICD-10-CM | POA: Diagnosis not present

## 2017-03-14 DIAGNOSIS — R319 Hematuria, unspecified: Secondary | ICD-10-CM | POA: Diagnosis not present

## 2017-03-14 DIAGNOSIS — R630 Anorexia: Secondary | ICD-10-CM | POA: Diagnosis not present

## 2017-03-14 DIAGNOSIS — C189 Malignant neoplasm of colon, unspecified: Secondary | ICD-10-CM | POA: Diagnosis not present

## 2017-03-14 HISTORY — PX: IR NEPHROSTOMY EXCHANGE LEFT: IMG6069

## 2017-03-14 HISTORY — PX: IR NEPHROSTOMY EXCHANGE RIGHT: IMG6070

## 2017-03-14 LAB — URINALYSIS, COMPLETE (UACMP) WITH MICROSCOPIC
Bilirubin Urine: NEGATIVE
GLUCOSE, UA: 150 mg/dL — AB
HGB URINE DIPSTICK: NEGATIVE
Ketones, ur: NEGATIVE mg/dL
NITRITE: NEGATIVE
Protein, ur: 30 mg/dL — AB
SPECIFIC GRAVITY, URINE: 1.009 (ref 1.005–1.030)
pH: 5 (ref 5.0–8.0)

## 2017-03-14 LAB — COMPREHENSIVE METABOLIC PANEL
ALBUMIN: 3.4 g/dL — AB (ref 3.5–5.0)
ALT: 15 U/L — ABNORMAL LOW (ref 17–63)
ANION GAP: 13 (ref 5–15)
AST: 24 U/L (ref 15–41)
Alkaline Phosphatase: 71 U/L (ref 38–126)
BILIRUBIN TOTAL: 1 mg/dL (ref 0.3–1.2)
BUN: 26 mg/dL — ABNORMAL HIGH (ref 6–20)
CHLORIDE: 97 mmol/L — AB (ref 101–111)
CO2: 21 mmol/L — ABNORMAL LOW (ref 22–32)
Calcium: 9.3 mg/dL (ref 8.9–10.3)
Creatinine, Ser: 1.75 mg/dL — ABNORMAL HIGH (ref 0.61–1.24)
GFR calc Af Amer: 45 mL/min — ABNORMAL LOW (ref >60)
GFR calc non Af Amer: 39 mL/min — ABNORMAL LOW (ref >60)
GLUCOSE: 215 mg/dL — AB (ref 65–99)
POTASSIUM: 4.1 mmol/L (ref 3.5–5.1)
Sodium: 131 mmol/L — ABNORMAL LOW (ref 135–145)
TOTAL PROTEIN: 7.4 g/dL (ref 6.5–8.1)

## 2017-03-14 LAB — CBC WITH DIFFERENTIAL/PLATELET
BASOS ABS: 0.1 10*3/uL (ref 0–0.1)
BASOS PCT: 1 %
EOS ABS: 0.3 10*3/uL (ref 0–0.7)
EOS PCT: 2 %
HCT: 33.1 % — ABNORMAL LOW (ref 40.0–52.0)
Hemoglobin: 11.1 g/dL — ABNORMAL LOW (ref 13.0–18.0)
LYMPHS PCT: 8 %
Lymphs Abs: 0.9 10*3/uL — ABNORMAL LOW (ref 1.0–3.6)
MCH: 28.7 pg (ref 26.0–34.0)
MCHC: 33.4 g/dL (ref 32.0–36.0)
MCV: 85.9 fL (ref 80.0–100.0)
MONO ABS: 1.2 10*3/uL — AB (ref 0.2–1.0)
Monocytes Relative: 11 %
NEUTROS ABS: 8.8 10*3/uL — AB (ref 1.4–6.5)
Neutrophils Relative %: 78 %
PLATELETS: 275 10*3/uL (ref 150–440)
RBC: 3.86 MIL/uL — ABNORMAL LOW (ref 4.40–5.90)
RDW: 15.8 % — AB (ref 11.5–14.5)
WBC: 11.3 10*3/uL — AB (ref 3.8–10.6)

## 2017-03-14 LAB — LACTIC ACID, PLASMA: LACTIC ACID, VENOUS: 1.6 mmol/L (ref 0.5–1.9)

## 2017-03-14 LAB — GLUCOSE, CAPILLARY
GLUCOSE-CAPILLARY: 213 mg/dL — AB (ref 65–99)
GLUCOSE-CAPILLARY: 215 mg/dL — AB (ref 65–99)

## 2017-03-14 MED ORDER — VANCOMYCIN HCL IN DEXTROSE 1-5 GM/200ML-% IV SOLN
1000.0000 mg | Freq: Once | INTRAVENOUS | Status: AC
  Administered 2017-03-14: 1000 mg via INTRAVENOUS
  Filled 2017-03-14: qty 200

## 2017-03-14 MED ORDER — SODIUM CHLORIDE 0.9 % IV SOLN
Freq: Once | INTRAVENOUS | Status: AC
  Administered 2017-03-14: 15:00:00 via INTRAVENOUS

## 2017-03-14 MED ORDER — ACETAMINOPHEN 325 MG PO TABS
650.0000 mg | ORAL_TABLET | Freq: Once | ORAL | Status: AC | PRN
  Administered 2017-03-14: 650 mg via ORAL
  Filled 2017-03-14: qty 2

## 2017-03-14 MED ORDER — METOPROLOL TARTRATE 25 MG PO TABS
25.0000 mg | ORAL_TABLET | Freq: Two times a day (BID) | ORAL | Status: DC
  Administered 2017-03-14 – 2017-03-17 (×6): 25 mg via ORAL
  Filled 2017-03-14 (×6): qty 1

## 2017-03-14 MED ORDER — INSULIN ASPART 100 UNIT/ML ~~LOC~~ SOLN: 0.0000 [IU] | Freq: Three times a day (TID) | SUBCUTANEOUS | Status: DC

## 2017-03-14 MED ORDER — LIDOCAINE-EPINEPHRINE (PF) 1 %-1:200000 IJ SOLN
INTRAMUSCULAR | Status: AC | PRN
  Administered 2017-03-14: 10 mL

## 2017-03-14 MED ORDER — ONDANSETRON HCL 4 MG PO TABS: 4.0000 mg | ORAL_TABLET | Freq: Four times a day (QID) | ORAL | Status: DC | PRN

## 2017-03-14 MED ORDER — PIPERACILLIN-TAZOBACTAM 3.375 G IVPB 30 MIN
3.3750 g | Freq: Once | INTRAVENOUS | Status: AC
  Administered 2017-03-14: 3.375 g via INTRAVENOUS
  Filled 2017-03-14: qty 50

## 2017-03-14 MED ORDER — INSULIN ASPART 100 UNIT/ML ~~LOC~~ SOLN
4.0000 [IU] | Freq: Once | SUBCUTANEOUS | Status: AC
  Administered 2017-03-14: 4 [IU] via SUBCUTANEOUS
  Filled 2017-03-14: qty 1

## 2017-03-14 MED ORDER — FENTANYL CITRATE (PF) 100 MCG/2ML IJ SOLN
INTRAMUSCULAR | Status: AC
  Filled 2017-03-14: qty 2

## 2017-03-14 MED ORDER — ACETAMINOPHEN 650 MG RE SUPP
650.0000 mg | Freq: Four times a day (QID) | RECTAL | Status: DC | PRN
Start: 2017-03-14 — End: 2017-03-17

## 2017-03-14 MED ORDER — PRAVASTATIN SODIUM 20 MG PO TABS
20.0000 mg | ORAL_TABLET | Freq: Every day | ORAL | Status: DC
  Administered 2017-03-14 – 2017-03-16 (×3): 20 mg via ORAL
  Filled 2017-03-14 (×3): qty 1

## 2017-03-14 MED ORDER — LIDOCAINE-EPINEPHRINE (PF) 1 %-1:200000 IJ SOLN
INTRAMUSCULAR | Status: AC
  Filled 2017-03-14: qty 30

## 2017-03-14 MED ORDER — GLIMEPIRIDE 2 MG PO TABS
4.0000 mg | ORAL_TABLET | Freq: Two times a day (BID) | ORAL | Status: DC
  Administered 2017-03-15: 4 mg via ORAL
  Filled 2017-03-14: qty 1
  Filled 2017-03-14: qty 2

## 2017-03-14 MED ORDER — ACETAMINOPHEN 325 MG PO TABS
650.0000 mg | ORAL_TABLET | Freq: Four times a day (QID) | ORAL | Status: DC | PRN
  Administered 2017-03-14 – 2017-03-15 (×3): 650 mg via ORAL
  Filled 2017-03-14 (×3): qty 2

## 2017-03-14 MED ORDER — SODIUM CHLORIDE 0.9 % IV SOLN
Freq: Once | INTRAVENOUS | Status: AC
  Administered 2017-03-14: 17:00:00 via INTRAVENOUS

## 2017-03-14 MED ORDER — DEXTROSE 5 % IV SOLN
1.0000 g | INTRAVENOUS | Status: DC
  Administered 2017-03-14: 1 g via INTRAVENOUS
  Filled 2017-03-14 (×2): qty 10

## 2017-03-14 MED ORDER — MIDAZOLAM HCL 5 MG/5ML IJ SOLN
INTRAMUSCULAR | Status: AC
  Filled 2017-03-14: qty 5

## 2017-03-14 MED ORDER — DIGOXIN 250 MCG PO TABS
0.2500 mg | ORAL_TABLET | Freq: Every day | ORAL | Status: DC
  Administered 2017-03-15 – 2017-03-17 (×3): 0.25 mg via ORAL
  Filled 2017-03-14 (×3): qty 1

## 2017-03-14 MED ORDER — IOPAMIDOL (ISOVUE-300) INJECTION 61%
30.0000 mL | Freq: Once | INTRAVENOUS | Status: AC | PRN
  Administered 2017-03-14: 20 mL

## 2017-03-14 MED ORDER — INSULIN ASPART 100 UNIT/ML ~~LOC~~ SOLN
0.0000 [IU] | Freq: Three times a day (TID) | SUBCUTANEOUS | Status: DC
  Administered 2017-03-14 – 2017-03-15 (×3): 3 [IU] via SUBCUTANEOUS
  Administered 2017-03-15: 08:00:00 2 [IU] via SUBCUTANEOUS
  Administered 2017-03-16: 3 [IU] via SUBCUTANEOUS
  Administered 2017-03-16: 08:00:00 2 [IU] via SUBCUTANEOUS
  Administered 2017-03-16 – 2017-03-17 (×3): 3 [IU] via SUBCUTANEOUS
  Filled 2017-03-14 (×9): qty 1

## 2017-03-14 MED ORDER — LIDOCAINE-EPINEPHRINE (PF) 1 %-1:200000 IJ SOLN: 10.0000 mL | Freq: Once | INTRAMUSCULAR | Status: DC

## 2017-03-14 MED ORDER — FENTANYL CITRATE (PF) 100 MCG/2ML IJ SOLN
INTRAMUSCULAR | Status: AC | PRN
  Administered 2017-03-14: 50 ug via INTRAVENOUS

## 2017-03-14 MED ORDER — HYDROCODONE-ACETAMINOPHEN 5-325 MG PO TABS
1.0000 | ORAL_TABLET | Freq: Four times a day (QID) | ORAL | Status: DC | PRN
  Administered 2017-03-14 – 2017-03-17 (×8): 1 via ORAL
  Filled 2017-03-14 (×8): qty 1

## 2017-03-14 MED ORDER — INSULIN GLARGINE 100 UNIT/ML ~~LOC~~ SOLN
10.0000 [IU] | Freq: Every day | SUBCUTANEOUS | Status: DC
  Administered 2017-03-14: 22:00:00 10 [IU] via SUBCUTANEOUS
  Filled 2017-03-14 (×2): qty 0.1

## 2017-03-14 MED ORDER — ONDANSETRON HCL 4 MG/2ML IJ SOLN: 4.0000 mg | Freq: Four times a day (QID) | INTRAMUSCULAR | Status: DC | PRN

## 2017-03-14 MED ORDER — INSULIN ASPART 100 UNIT/ML ~~LOC~~ SOLN
3.0000 [IU] | Freq: Three times a day (TID) | SUBCUTANEOUS | Status: DC
  Administered 2017-03-14 – 2017-03-17 (×9): 3 [IU] via SUBCUTANEOUS
  Filled 2017-03-14 (×9): qty 1

## 2017-03-14 NOTE — ED Notes (Signed)
Pt to specials now

## 2017-03-14 NOTE — H&P (Signed)
[facility] [place] NAME: Donald Townsend    MR#:  [id number]  DATE OF BIRTH:  07-25-1950  DATE OF ADMISSION:  03/14/2017  PRIMARY CARE PHYSICIAN: Dion Body, MD   REQUESTING/REFERRING PHYSICIAN: Dr. Jimmye Norman  CHIEF COMPLAINT:  Fever for 2 days and not feeling well Right nephrostomy tube not draining well  HISTORY OF PRESENT ILLNESS:  Donald Townsend  is a 66 y.o. male with a known history of metastatic rectal cancer with 2.  Adenopathy along with bilateral hydronephrosis from underlying pelvic mass requiring percutaneous nephrostomy tube placement in September 2018. Patient comes in with fever for 2 days weakness and right nephrostomy tube not draining well.  He has not had nephrostomy tubes changed since it was placed. Patient currently is taking Eliquis for acute left lower extremity DVT  He was found to have elevated white count of 11,000, fever of 100.4 and mildly tachycardic. Denies any nausea or vomiting Currently getting chemotherapy and radiation therapy at the cancer center.  Patient is being admitted for sepsis secondary to UTI in the setting of bilateral nephrostomy tube placement. He received IV Vanco and Zosyn She has had urine culture positive for Klebsiella and E. coli in the past which has been pansensitive  PAST MEDICAL HISTORY:   Past Medical History:  Diagnosis Date  . A-fib ([place])   . Cancer [facility])    Colon  . CHF (congestive heart failure) ([place])   . Diabetes mellitus without complication ([place])   . Dyspnea   . Elevated lipids   . Hypertension   . PVD (peripheral vascular disease) ([place])     PAST SURGICAL HISTOIRY:   Past Surgical History:  Procedure Laterality Date  . COLON RESECTION     with colostomy  . COLONOSCOPY    . CYSTOSCOPY WITH STENT PLACEMENT Bilateral 01/18/2017   Procedure: CYSTOSCOPY WITH STENT PLACEMENT;  Surgeon: [name], MD;  Location: [facility] ORS;  Service: Urology;   Laterality: Bilateral;  . IR FLUORO GUIDE PORT INSERTION RIGHT  01/24/2017  . IR NEPHROSTOMY PLACEMENT LEFT  01/19/2017  . IR NEPHROSTOMY PLACEMENT RIGHT  01/19/2017  . IVC FILTER REMOVAL N/A 08/28/2016   Procedure: IVC Filter Removal;  Surgeon: Katha Cabal, MD;  Location: [place] CV LAB;  Service: Cardiovascular;  Laterality: N/A;  . LOWER EXTREMITY VENOGRAPHY Left 12/18/2016   Procedure: Lower Extremity Venography;  Surgeon: Katha Cabal, MD;  Location: [place] CV LAB;  Service: Cardiovascular;  Laterality: Left;  . PERIPHERAL VASCULAR CATHETERIZATION Left 05/08/2016   Procedure: Lower Extremity Venography;  Surgeon: Katha Cabal, MD;  Location: [place] CV LAB;  Service: Cardiovascular;  Laterality: Left;    SOCIAL HISTORY:   Social History   Tobacco Use  . Smoking status: Never Smoker  . Smokeless tobacco: Never Used  Substance Use Topics  . Alcohol use: No    FAMILY HISTORY:   Family History  Problem Relation Age of Onset  . Diabetes Mother   . Stroke Mother   . Prostate cancer Brother   . Cancer Sister        Breast cancer  . Kidney cancer Neg Hx   . Bladder Cancer Neg Hx     DRUG ALLERGIES:  No Known Allergies  REVIEW OF SYSTEMS:  Review of Systems  Constitutional: Positive for fever and malaise/fatigue. Negative for chills and weight loss.  HENT: Negative for ear discharge, ear pain and nosebleeds.   Eyes: Negative for blurred  vision, pain and discharge.  Respiratory: Negative for sputum production, shortness of breath, wheezing and stridor.   Cardiovascular: Negative for chest pain, palpitations, orthopnea and PND.  Gastrointestinal: Negative for abdominal pain, diarrhea, nausea and vomiting.  Genitourinary: Negative for frequency and urgency.  Musculoskeletal: Positive for back pain. Negative for joint pain.  Neurological: Positive for weakness. Negative for sensory change, speech change and focal weakness.  Psychiatric/Behavioral:  Negative for depression and hallucinations. The patient is not nervous/anxious.      MEDICATIONS AT HOME:   Prior to Admission medications   Medication Sig Start Date End Date Taking? Authorizing Provider  digoxin (LANOXIN) 0.25 MG tablet Take 0.25 mg by mouth daily.  01/06/16 01/31/18 Yes [provider]  ELIQUIS 5 MG TABS tablet Take 5 mg by mouth every 12 (twelve) hours. 01/24/17  Yes [provider]  furosemide (LASIX) 40 MG tablet Take 40 mg by mouth daily.  01/22/17  Yes [provider]  glimepiride (AMARYL) 4 MG tablet Take 4 mg by mouth 2 (two) times daily.  04/21/16  Yes [provider]  insulin aspart (NOVOLOG) 100 UNIT/ML FlexPen Inject 3 Units into the skin 3 (three) times daily with meals.  12/13/16 12/13/17 Yes [provider]  LANTUS SOLOSTAR 100 UNIT/ML Solostar Pen Inject 10 Units at bedtime into the skin.  12/11/16  Yes [provider]  losartan (COZAAR) 100 MG tablet take 100mg  by mouth once daily 01/13/16  Yes [provider]  metoprolol tartrate (LOPRESSOR) 25 MG tablet Take 25 mg by mouth 2 (two) times daily.  04/25/16  Yes [provider]  pravastatin (PRAVACHOL) 20 MG tablet take 20mg  by mouth daily at bedtime 02/27/16  Yes [provider]  B-D ULTRAFINE III SHORT PEN 31G X 8 MM MISC  02/15/17   [provider]  Continuous Blood Gluc Receiver (FREESTYLE LIBRE READER) DEVI Use 1 Units as directed. Check CBG's four times daily. Dx: E11.9 12/13/16   [provider]  Continuous Blood Gluc Sensor (FREESTYLE LIBRE SENSOR SYSTEM) MISC Use 1 Units as directed. Check CBG's four times daily. Dx: E11.9 12/13/16   [provider]  feeding supplement, [place], ([place]) LIQD Follow manufacturer instructions 02/15/17   [provider]  HYDROcodone-acetaminophen (NORCO/VICODIN) 5-325 MG tablet Take 1 tablet by mouth every 6 (six) hours as needed for moderate pain.  01/28/17   Cammie Sickle, MD  Lancets 30G MISC Use 1 Units as directed. Check CBG's up to twice daily. Dx: E11.9 04/15/15   [provider]  lidocaine-prilocaine (EMLA) cream Apply 1 application topically as needed. Apply generously over the Mediport 45 minutes prior to chemotherapy. 01/28/17   Cammie Sickle, MD  ondansetron (ZOFRAN) 8 MG tablet 1 pill every 8 hours for nausea/vomitting as needed 01/28/17   Cammie Sickle, MD  prochlorperazine (COMPAZINE) 10 MG tablet Take 1 tablet (10 mg total) by mouth every 6 (six) hours as needed for nausea or vomiting. 01/28/17   Cammie Sickle, MD      VITAL SIGNS:  Blood pressure (!) 100/59, pulse (!) 107, temperature (!) 100.4 F (38 C), temperature source Oral, resp. rate 18, height 5\' 10"  (1.778 m), weight 113.4 kg (250 lb), SpO2 97 %.  PHYSICAL EXAMINATION:  GENERAL:  66 y.o.-year-old patient lying in the bed with no acute distress.  EYES: Pupils equal, round, reactive to light and accommodation. No scleral icterus. Extraocular muscles intact.  HEENT: Head atraumatic, normocephalic. Oropharynx and nasopharynx clear.  NECK:  Supple, no jugular venous distention. No thyroid enlargement, no tenderness.  LUNGS: Normal breath sounds bilaterally, no wheezing, rales,rhonchi or crepitation. No use of accessory muscles of respiration.  CARDIOVASCULAR: S1, S2 normal. No murmurs, rubs, or gallops.  ABDOMEN: Soft, nontender, nondistended. Bowel sounds present. No organomegaly or mass.  Bilateral nephrostomy tubes EXTREMITIES: No pedal edema, cyanosis, or clubbing.  NEUROLOGIC: Cranial nerves II through XII are intact. Muscle strength 5/5 in all extremities. Sensation intact. Gait not checked.  PSYCHIATRIC: The patient is alert and oriented x 3.  SKIN: No obvious rash, lesion, or ulcer.   LABORATORY PANEL:   CBC Recent Labs  Lab 03/14/17 1137  WBC 11.3*  HGB 11.1*  HCT 33.1*  PLT 275    ------------------------------------------------------------------------------------------------------------------  Chemistries  Recent Labs  Lab 03/14/17 1137  NA 131*  K 4.1  CL 97*  CO2 21*  GLUCOSE 215*  BUN 26*  CREATININE 1.75*  CALCIUM 9.3  AST 24  ALT 15*  ALKPHOS 71  BILITOT 1.0   ------------------------------------------------------------------------------------------------------------------  Cardiac Enzymes No results for input(s): TROPONINI in the last 168 hours. ------------------------------------------------------------------------------------------------------------------  RADIOLOGY:  Dg Chest 2 View  Result Date: 03/14/2017 CLINICAL DATA:  Fever. History of colon cancer. Currently on chemoradiation. EXAM: CHEST  2 VIEW COMPARISON:  Chest x-ray dated January 31, 2017. FINDINGS: Right chest wall port catheter with tip at the cavoatrial junction, unchanged. The cardiomediastinal silhouette is normal in size. Normal pulmonary vascularity. No focal consolidation, pleural effusion, or pneumothorax. No acute osseous abnormality. IMPRESSION: No active cardiopulmonary disease. Electronically Signed   By: Titus Dubin M.D.   On: 03/14/2017 12:14   US Renal  Result Date: 03/14/2017 CLINICAL DATA:  Fever chills and nephrostomy tube EXAM: RENAL / URINARY TRACT ULTRASOUND COMPLETE COMPARISON:  PET-CT 01/22/2017 FINDINGS: Right Kidney: Length: 11.9 cm. Cortical echogenicity within normal limits. Partially visualize right nephrostomy tube without significant hydronephrosis or perinephric collection. Left Kidney: Length: 10.3 cm. Cortical echogenicity within normal limits. Partially visualized nephrostomy tube in the lower pole. No perinephric collection Bladder: Poorly defined with probable wall thickening. IMPRESSION: 1. Bilateral partially visualized nephrostomy tubes. No significant hydronephrosis. No perinephric fluid collections by sonography 2. Poorly visible urinary  bladder. Electronically Signed   By: Donavan Foil M.D.   On: 03/14/2017 15:16    EKG:   Sinus tachycardia IMPRESSION AND PLAN:   Donald Townsend  is a 66 y.o. male with a known history of metastatic rectal cancer with 2.  Adenopathy along with bilateral hydronephrosis from underlying pelvic mass requiring percutaneous nephrostomy tube placement in September 2018. Patient comes in with fever for 2 days weakness and right nephrostomy tube not draining well.  He has not had nephrostomy tubes changed since it was placed.  1.  Sepsis secondary to UTI in the setting of bilateral nephrostomy tube placement -Admit to medical floor -IV fluids -IV Rocephin based upon previous 2 urine culture results -Follow-up blood culture urine culture -Monitor fever curve.  Tylenol as needed  2.  Bilateral hydronephrosis secondary to pelvic mass due to rectal cancer metastatic cyst requiring bilateral nephrostomy tube placement in September 2018 -Patient is followed by Dr. Erlene Quan -Urology on call has been notified by ER physician.  Recommends IR change nephrostomy tube -Up with Dr. Pascal Lux from IR who is going to exchange nephrostomy tube today  3.  Acute left lower extremity DVT -On Eliquis  4.  Type 2 diabetes -Continue home meds and insulin sliding scale  5.  Hypertension continue home meds  6.  DVT prophylaxis on Eliquis   All the records are reviewed and case discussed with ED provider. Management plans discussed with the patient, family and they are in agreement.  CODE STATUS:full  TOTAL TIME TAKING CARE OF THIS PATIENT:50 minutes.    Fritzi Mandes M.D on 03/14/2017 at 3:35 PM  Between 7am to 6pm - Pager - [phone]  After 6pm go to [email or web address] - password [email or web address] [facility]  [facility]  Office  [phone]  CC: Primary care physician; Dion Body, MD

## 2017-03-14 NOTE — Telephone Encounter (Signed)
Pt daughter sent a mychart message about pt having pain around the nephrostomy tubes and running a 100.3 fever last night and this morning was 101.1. Spoke with Larene Beach and Dr. Pilar Jarvis who stated pt needs to go to the ER immediately as he is currently on chemo. Per Dr. Pilar Jarvis a person on chemo with that kind of fever is like a person not on chemo having a 106 fever. Made pt aware. Also reinforced with pt to call the cancer center to make them aware of whats going on. Pt voiced understanding of whole conversation.

## 2017-03-14 NOTE — Consult Note (Signed)
I have been asked to see the patient by Dr. Jacinta Shoe, for evaluation and management of fevers with a history of bilateral nephrostomy tube placement.  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  He had a failed attempt of stent placement in September 2018 and has subsequently had bilaterally nephrostomy tubes placed.  He is currently getting chemoradiation for palliative treatment of his pelvic mass.  He received chemotherapy 2 weeks ago, and is currently undergoing radiation.  The patient presented with 24 hours of fevers and lethargy.  His fever was as high as 101.0.  He has vague complaints, but does state that he has had discomfort in his nephrostomy tubes recently over the past several days.  He denies any foul-smelling drainage or noticeable hematuria from the nephrostomy tubes.  He has very little pain other than discomfort in the flank regions bilaterally.  His right nephrostomy tube had not been draining quite as well as the left recently.  In addition, the patient states that he has been urinating bloody purulent discharge at times.  It is rarely urine colored.  He denies any dysuria.  He denies any suprapubic pain.  In the emergency department the patient was noted to have a slight elevation in his white blood cell count (considering recent chemotherapy).  His renal function is relatively unchanged.  His urine analysis difficult to interpret but yeast along with bacteria noted to be present.  Renal ultrasound demonstrated no single ureter nephrosis.  His nephrostomy  tubes were exchanged prior to admission.  I interviewed the patient in the recovery area of interventional radiology.   Review of systems: A 12 point comprehensive review of systems was obtained and is negative unless otherwise stated in the history of present illness.  Metastatic  colorectal cancer  Patient Active Problem List   Diagnosis Date Noted  . Sepsis ([place]) 01/31/2017  . Goals of care, counseling/discussion 01/28/2017  . Hydronephrosis with ureteral stricture, not elsewhere classified   . AKI (acute kidney injury) ([place]) 01/18/2017  . Lymphadenopathy, inguinal 01/17/2017  . Rectal cancer ([place]) 01/16/2017  . Left leg pain 12/12/2016  . May-Thurner syndrome 12/12/2016  . A-fib ([place]) 06/09/2016  . Type 2 diabetes mellitus ([place]) 06/09/2016  . Hyperlipidemia 06/09/2016  . Acute deep vein thrombosis (DVT) of iliac vein of left lower extremity ([place]) 05/08/2016    No current facility-administered medications on file prior to encounter.    Current Outpatient Medications on File Prior to Encounter  Medication Sig Dispense Refill  . digoxin (LANOXIN) 0.25 MG tablet Take 0.25 mg by mouth daily.     [name] ELIQUIS 5 MG TABS tablet Take 5 mg by mouth every 12 (twelve) hours.  0  . furosemide (LASIX) 40 MG tablet Take 40 mg by mouth daily.   0  . glimepiride (AMARYL) 4 MG tablet Take 4 mg by mouth 2 (two) times daily.   0  . insulin aspart (NOVOLOG) 100 UNIT/ML FlexPen Inject 3 Units into the skin 3 (three) times daily with meals.     [name] LANTUS SOLOSTAR 100 UNIT/ML Solostar Pen Inject 10 Units at bedtime into the skin.   0  . losartan (COZAAR) 100 MG tablet take 100mg  by mouth once daily    . metoprolol tartrate (LOPRESSOR) 25 MG tablet Take 25 mg by mouth 2 (two) times daily.     . pravastatin (PRAVACHOL) 20 MG tablet take  20mg  by mouth daily at bedtime    . B-D ULTRAFINE III SHORT PEN 31G X 8 MM MISC     . Continuous Blood Gluc Receiver (FREESTYLE LIBRE READER) DEVI Use 1 Units as directed. Check CBG's four times daily. Dx: E11.9    . Continuous Blood Gluc Sensor (FREESTYLE LIBRE SENSOR SYSTEM) MISC Use 1 Units as directed. Check CBG's four times daily. Dx: E11.9    . feeding supplement, GLUCERNA SHAKE, ([place]) LIQD Follow manufacturer instructions    .  HYDROcodone-acetaminophen (NORCO/VICODIN) 5-325 MG tablet Take 1 tablet by mouth every 6 (six) hours as needed for moderate pain. 90 tablet 0  . Lancets 30G MISC Use 1 Units as directed. Check CBG's up to twice daily. Dx: E11.9    . lidocaine-prilocaine (EMLA) cream Apply 1 application topically as needed. Apply generously over the Mediport 45 minutes prior to chemotherapy. 30 g 0  . ondansetron (ZOFRAN) 8 MG tablet 1 pill every 8 hours for nausea/vomitting as needed 40 tablet 2  . prochlorperazine (COMPAZINE) 10 MG tablet Take 1 tablet (10 mg total) by mouth every 6 (six) hours as needed for nausea or vomiting. 40 tablet 1    Past Medical History:  Diagnosis Date  . A-fib ([place])   . Cancer [facility])    Colon  . CHF (congestive heart failure) ([place])   . Diabetes mellitus without complication ([place])   . Dyspnea   . Elevated lipids   . Hypertension   . PVD (peripheral vascular disease) ([place])     Past Surgical History:  Procedure Laterality Date  . COLON RESECTION     with colostomy  . COLONOSCOPY    . CYSTOSCOPY WITH STENT PLACEMENT Bilateral 01/18/2017   Procedure: CYSTOSCOPY WITH STENT PLACEMENT;  Surgeon: [name], MD;  Location: [facility] ORS;  Service: Urology;  Laterality: Bilateral;  . IR FLUORO GUIDE PORT INSERTION RIGHT  01/24/2017  . IR NEPHROSTOMY EXCHANGE LEFT  03/14/2017  . IR NEPHROSTOMY EXCHANGE RIGHT  03/14/2017  . IR NEPHROSTOMY PLACEMENT LEFT  01/19/2017  . IR NEPHROSTOMY PLACEMENT RIGHT  01/19/2017  . IVC FILTER REMOVAL N/A 08/28/2016   Procedure: IVC Filter Removal;  Surgeon: Katha Cabal, MD;  Location: [place] CV LAB;  Service: Cardiovascular;  Laterality: N/A;  . LOWER EXTREMITY VENOGRAPHY Left 12/18/2016   Procedure: Lower Extremity Venography;  Surgeon: Katha Cabal, MD;  Location: [place] [facility];  Service: Cardiovascular;  Laterality: Left;  . PERIPHERAL VASCULAR CATHETERIZATION Left 05/08/2016   Procedure: Lower Extremity Venography;   Surgeon: Katha Cabal, MD;  Location: [place] CV LAB;  Service: Cardiovascular;  Laterality: Left;    Social History   Tobacco Use  . Smoking status: Never Smoker  . Smokeless tobacco: Never Used  Substance Use Topics  . Alcohol use: No  . Drug use: No    Family History  Problem Relation Age of Onset  . Diabetes Mother   . Stroke Mother   . Prostate cancer Brother   . Cancer Sister        Breast cancer  . Kidney cancer Neg Hx   . Bladder Cancer Neg Hx     PE: Vitals:   03/14/17 1636 03/14/17 1640 03/14/17 1652 03/14/17 1703  BP: 134/68 134/69 129/67 104/78  Pulse: 82 90 87 89  Resp: 12 12    Temp:      TempSrc:      SpO2:  100%    Weight:  Height:       Patient appears to be in no acute distress  patient is alert and oriented x3 Atraumatic normocephalic head No cervical or supraclavicular lymphadenopathy appreciated No increased work of breathing, no audible wheezes/rhonchi Regular sinus rhythm/rate Abdomen is soft, nontender, nondistended, no CVA or suprapubic tenderness To nephrostomy tubes are not draining clear yellow urine.  Incision sites are clean/dry/intact Lower extremities are symmetric without appreciable edema Grossly neurologically intact No identifiable skin lesions  Recent Labs    03/14/17 1137  WBC 11.3*  HGB 11.1*  HCT 33.1*   Recent Labs    03/14/17 1137  NA 131*  K 4.1  CL 97*  CO2 21*  GLUCOSE 215*  BUN 26*  CREATININE 1.75*  CALCIUM 9.3   No results for input(s): LABPT, INR in the last 72 hours. No results for input(s): LABURIN in the last 72 hours. Results for orders placed or performed in visit on 02/18/17  Urine Culture     Status: Abnormal   Collection Time: 02/18/17 10:00 AM  Result Value Ref Range Status   Specimen Description URINE, RANDOM  Final   Special Requests NONE  Final   Culture MULTIPLE SPECIES PRESENT, SUGGEST RECOLLECTION (A)  Final   Report Status 02/19/2017 FINAL  Final     Imaging: I reviewed the renal ultrasound, several prior CT scans, and the pyelograms performed in interventional radiology which essentially demonstrate no hydronephrosis.   appropriate position of the nephrostomy tubes was also noted.  Imp: I suspect the patient has developed pyelonephritis.  His nephrostomy tubes were due to be exchanged, which has been done today.  I suspect that the purulent and bloody discharge from his urethra is related to his tumor invading into the bladder wall.  Without other symptoms like dysuria or suprapubic pain I do not suspect that he has a cystitis.   Recommendations: At this point, the patient should be treated with broad-spectrum antibiotics to treat his pyelonephritis. He does have yeast growing from his nephrostomy tube which should be covered for fungal infection as well. Cystitis would also be covered with broad-spectrum antibiotics at this time.  Nephrostomy tube should be continued to be exchanged on a 6-week interval.  He should continue to get the scheduled radiation per his oncology team.  We will continue to follow this patient while he is in the hospital.  Ardis Hughs

## 2017-03-14 NOTE — Procedures (Signed)
Pre Procedure Dx: Hydronephrosis Post Procedure Dx: Same  Successful bilateral PCN exchange.    EBL: None   No immediate complications.   Ronny Bacon, MD Pager #: [phone]

## 2017-03-14 NOTE — ED Notes (Signed)
Pt is taking chemo and is having generalized weakness. Pt stated that he has been running a fever for two days.

## 2017-03-14 NOTE — ED Provider Notes (Signed)
[facility] Emergency Department Provider Note       Time seen: ----------------------------------------- 1:45 PM on 03/14/2017 -----------------------------------------    I have reviewed the triage vital signs and the nursing notes.  HISTORY   Chief Complaint Fever    HPI Donald Townsend is a 66 y.o. male with a history of bilateral nephrostomy tubes placed for ureteral obstruction who presents to the ED for fever and chills for the past 2 days.  Patient is currently receiving chemo for rectal cancer.  Reportedly he had had attempts placed to alleviate ureteral obstruction without improvement, after which time he had nephrostomy tubes placed to relieve pressure.  Patient has had one other episode of infection secondary to sepsis and his nephrostomy tubes.  Patient has seen pus from his nephrostomy tube recently.  Past Medical History:  Diagnosis Date  . A-fib ([place])   . Cancer [facility])    Colon  . CHF (congestive heart failure) ([place])   . Diabetes mellitus without complication ([place])   . Dyspnea   . Elevated lipids   . Hypertension   . PVD (peripheral vascular disease) [facility])     Patient Active Problem List   Diagnosis Date Noted  . Sepsis ([place]) 01/31/2017  . Goals of care, counseling/discussion 01/28/2017  . Hydronephrosis with ureteral stricture, not elsewhere classified   . AKI (acute kidney injury) ([place]) 01/18/2017  . Lymphadenopathy, inguinal 01/17/2017  . Rectal cancer ([place]) 01/16/2017  . Left leg pain 12/12/2016  . May-Thurner syndrome 12/12/2016  . A-fib ([place]) 06/09/2016  . Type 2 diabetes mellitus ([place]) 06/09/2016  . Hyperlipidemia 06/09/2016  . Acute deep vein thrombosis (DVT) of iliac vein of left lower extremity ([place]) 05/08/2016    Past Surgical History:  Procedure Laterality Date  . COLON RESECTION     with colostomy  . COLONOSCOPY    . CYSTOSCOPY WITH STENT PLACEMENT Bilateral 01/18/2017   Procedure: CYSTOSCOPY WITH STENT  PLACEMENT;  Surgeon: [name], MD;  Location: [facility] ORS;  Service: Urology;  Laterality: Bilateral;  . IR FLUORO GUIDE PORT INSERTION RIGHT  01/24/2017  . IR NEPHROSTOMY PLACEMENT LEFT  01/19/2017  . IR NEPHROSTOMY PLACEMENT RIGHT  01/19/2017  . IVC FILTER REMOVAL N/A 08/28/2016   Procedure: IVC Filter Removal;  Surgeon: Katha Cabal, MD;  Location: [place] CV LAB;  Service: Cardiovascular;  Laterality: N/A;  . LOWER EXTREMITY VENOGRAPHY Left 12/18/2016   Procedure: Lower Extremity Venography;  Surgeon: Katha Cabal, MD;  Location: [place] CV LAB;  Service: Cardiovascular;  Laterality: Left;  . PERIPHERAL VASCULAR CATHETERIZATION Left 05/08/2016   Procedure: Lower Extremity Venography;  Surgeon: Katha Cabal, MD;  Location: [place] CV LAB;  Service: Cardiovascular;  Laterality: Left;    Allergies Patient has no known allergies.  Social History Social History   Tobacco Use  . Smoking status: Never Smoker  . Smokeless tobacco: Never Used  Substance Use Topics  . Alcohol use: No  . Drug use: No    Review of Systems Constitutional: Positive for fever and chills Eyes: Negative for vision changes ENT:  Negative for congestion, sore throat Cardiovascular: Negative for chest pain. Respiratory: Negative for shortness of breath. Gastrointestinal: Negative for abdominal pain, vomiting and diarrhea. Genitourinary: Negative for dysuria. Musculoskeletal: Positive for back pain Skin: Negative for rash. Neurological: Negative for headaches, positive for weakness  All systems negative/normal/unremarkable except as stated in the HPI  ____________________________________________   PHYSICAL EXAM:  VITAL SIGNS: ED Triage Vitals  Enc  Vitals Group     BP 03/14/17 1114 116/68     Pulse Rate 03/14/17 1114 96     Resp 03/14/17 1114 18     Temp 03/14/17 1114 (!) 100.4 F (38 C)     Temp Source 03/14/17 1114 Oral     SpO2 03/14/17 1114 99 %     Weight  03/14/17 1114 250 lb (113.4 kg)     Height 03/14/17 1114 5\' 10"  (1.778 m)     Head Circumference --      Peak Flow --      Pain Score 03/14/17 1136 0     Pain Loc --      Pain Edu? --      Excl. in [place]? --     Constitutional: Alert and oriented.  Mild distress Eyes: Conjunctivae are normal. Normal extraocular movements. ENT   Head: Normocephalic and atraumatic.   Nose: No congestion/rhinnorhea.   Mouth/Throat: Mucous membranes are moist.   Neck: No stridor. Cardiovascular: Normal rate, regular rhythm. No murmurs, rubs, or gallops. Respiratory: Normal respiratory effort without tachypnea nor retractions. Breath sounds are clear and equal bilaterally. No wheezes/rales/rhonchi. Gastrointestinal: Soft and nontender. Normal bowel sounds Musculoskeletal: Nontender with normal range of motion in extremities. No lower extremity tenderness nor edema.  Bilateral nephrostomy tube sites appear normal Neurologic:  Normal speech and language. No gross focal neurologic deficits are appreciated.  Skin:  Skin is warm, dry and intact. No rash noted. Psychiatric: Mood and affect are normal. Speech and behavior are normal.  ____________________________________________  ED COURSE:  Pertinent labs & imaging results that were available during my care of the patient were reviewed by me and considered in my medical decision making (see chart for details). Patient presents for likely sepsis, we will assess with labs and imaging as indicated.   Procedures ____________________________________________   LABS (pertinent positives/negatives)  Labs Reviewed  COMPREHENSIVE METABOLIC PANEL - Abnormal; Notable for the following components:      Result Value   Sodium 131 (*)    Chloride 97 (*)    CO2 21 (*)    Glucose, Bld 215 (*)    BUN 26 (*)    Creatinine, Ser 1.75 (*)    Albumin 3.4 (*)    ALT 15 (*)    GFR calc non Af Amer 39 (*)    GFR calc Af Amer 45 (*)    All other components within  normal limits  CBC WITH DIFFERENTIAL/PLATELET - Abnormal; Notable for the following components:   WBC 11.3 (*)    RBC 3.86 (*)    Hemoglobin 11.1 (*)    HCT 33.1 (*)    RDW 15.8 (*)    Neutro Abs 8.8 (*)    Lymphs Abs 0.9 (*)    Monocytes Absolute 1.2 (*)    All other components within normal limits  URINALYSIS, COMPLETE (UACMP) WITH MICROSCOPIC - Abnormal; Notable for the following components:   Color, Urine YELLOW (*)    APPearance CLOUDY (*)    Glucose, UA 150 (*)    Protein, ur 30 (*)    Leukocytes, UA LARGE (*)    Bacteria, UA MANY (*)    Squamous Epithelial / LPF 0-5 (*)    All other components within normal limits  URINE CULTURE  CULTURE, BLOOD (ROUTINE X 2)  CULTURE, BLOOD (ROUTINE X 2)  LACTIC ACID, PLASMA  LACTIC ACID, PLASMA   ____________________________________________  DIFFERENTIAL DIAGNOSIS   UTI, pyelonephritis, perinephric abscess, dehydration, electrolyte abnormality  FINAL ASSESSMENT AND PLAN  Sepsis   Plan: Patient had presented for fever and chills and likely sepsis with a previous episode of same. Patient's labs were indicative of likely developing infection in his urine or involving his nephrostomy tubes. Patient may need to have further imaging of his nephrostomy tubes and kidneys at some point.  I have started him on broad-spectrum antibiotics as well as fluid resuscitation for possible sepsis.  I will discuss with urology.   Earleen Newport, MD   Note: This note was generated in part or whole with voice recognition software. Voice recognition is usually quite accurate but there are transcription errors that can and very often do occur. I apologize for any typographical errors that were not detected and corrected.     Earleen Newport, MD 03/14/17 [phone]

## 2017-03-14 NOTE — ED Triage Notes (Signed)
Pt in via POV with complaints of fever x 2 days ago.  Pt currently receiving chemo for colon cancer, oncologist advised he be evaluated here due to fever.  Pt denies any other complaints, NAD noted at this time.

## 2017-03-14 NOTE — Telephone Encounter (Signed)
Phone call to patient to schedule appointment with NP today. He reports that he is at the ER right now

## 2017-03-14 NOTE — ED Notes (Signed)
Spoke with dr patel and with specials - pt is going now to get nephrostomy tubes replaced.

## 2017-03-14 NOTE — ED Notes (Signed)
Pt in ct 

## 2017-03-14 NOTE — ED Notes (Signed)
Pt to room 5 from triage - pt is a cancer pt with fever today and c/o weakness. Blood work obtained in triage with antibiotics ordered in triage. Pt does not have an iv.

## 2017-03-14 NOTE — ED Notes (Signed)
Mask placed on pt at time of triage, prior to triage end, pt takes mask off, refuses to wear any longer.

## 2017-03-15 ENCOUNTER — Ambulatory Visit
Admission: RE | Admit: 2017-03-15 | Discharge: 2017-03-15 | Disposition: A | Discharge status: Home / Self Care | Payer: Medicare Other | Source: Ambulatory Visit | Attending: Radiation Oncology | Admitting: Radiation Oncology

## 2017-03-15 ENCOUNTER — Ambulatory Visit: Payer: Medicare Other

## 2017-03-15 ENCOUNTER — Other Ambulatory Visit: Payer: Self-pay | Admitting: Internal Medicine

## 2017-03-15 DIAGNOSIS — R0609 Other forms of dyspnea: Secondary | ICD-10-CM

## 2017-03-15 DIAGNOSIS — Z794 Long term (current) use of insulin: Secondary | ICD-10-CM

## 2017-03-15 DIAGNOSIS — I739 Peripheral vascular disease, unspecified: Secondary | ICD-10-CM

## 2017-03-15 DIAGNOSIS — I4891 Unspecified atrial fibrillation: Secondary | ICD-10-CM

## 2017-03-15 DIAGNOSIS — C7911 Secondary malignant neoplasm of bladder: Secondary | ICD-10-CM | POA: Diagnosis not present

## 2017-03-15 DIAGNOSIS — I10 Essential (primary) hypertension: Secondary | ICD-10-CM

## 2017-03-15 DIAGNOSIS — C20 Malignant neoplasm of rectum: Secondary | ICD-10-CM

## 2017-03-15 DIAGNOSIS — Z8042 Family history of malignant neoplasm of prostate: Secondary | ICD-10-CM

## 2017-03-15 DIAGNOSIS — E785 Hyperlipidemia, unspecified: Secondary | ICD-10-CM

## 2017-03-15 DIAGNOSIS — N1330 Unspecified hydronephrosis: Secondary | ICD-10-CM

## 2017-03-15 DIAGNOSIS — N390 Urinary tract infection, site not specified: Secondary | ICD-10-CM

## 2017-03-15 DIAGNOSIS — E119 Type 2 diabetes mellitus without complications: Secondary | ICD-10-CM

## 2017-03-15 DIAGNOSIS — Z79899 Other long term (current) drug therapy: Secondary | ICD-10-CM

## 2017-03-15 DIAGNOSIS — R630 Anorexia: Secondary | ICD-10-CM

## 2017-03-15 DIAGNOSIS — A419 Sepsis, unspecified organism: Secondary | ICD-10-CM

## 2017-03-15 DIAGNOSIS — R509 Fever, unspecified: Secondary | ICD-10-CM

## 2017-03-15 DIAGNOSIS — I509 Heart failure, unspecified: Secondary | ICD-10-CM

## 2017-03-15 DIAGNOSIS — R319 Hematuria, unspecified: Secondary | ICD-10-CM

## 2017-03-15 DIAGNOSIS — Z803 Family history of malignant neoplasm of breast: Secondary | ICD-10-CM

## 2017-03-15 LAB — GLUCOSE, CAPILLARY
GLUCOSE-CAPILLARY: 188 mg/dL — AB (ref 65–99)
Glucose-Capillary: 181 mg/dL — ABNORMAL HIGH (ref 65–99)
Glucose-Capillary: 208 mg/dL — ABNORMAL HIGH (ref 65–99)
Glucose-Capillary: 220 mg/dL — ABNORMAL HIGH (ref 65–99)
Glucose-Capillary: 271 mg/dL — ABNORMAL HIGH (ref 65–99)

## 2017-03-15 LAB — BASIC METABOLIC PANEL
Anion gap: 8 (ref 5–15)
BUN: 22 mg/dL — AB (ref 6–20)
CHLORIDE: 103 mmol/L (ref 101–111)
CO2: 23 mmol/L (ref 22–32)
CREATININE: 1.74 mg/dL — AB (ref 0.61–1.24)
Calcium: 8.3 mg/dL — ABNORMAL LOW (ref 8.9–10.3)
GFR calc Af Amer: 45 mL/min — ABNORMAL LOW (ref >60)
GFR calc non Af Amer: 39 mL/min — ABNORMAL LOW (ref >60)
GLUCOSE: 191 mg/dL — AB (ref 65–99)
POTASSIUM: 4 mmol/L (ref 3.5–5.1)
Sodium: 134 mmol/L — ABNORMAL LOW (ref 135–145)

## 2017-03-15 MED ORDER — APIXABAN 5 MG PO TABS
5.0000 mg | ORAL_TABLET | Freq: Two times a day (BID) | ORAL | Status: DC
  Administered 2017-03-15 – 2017-03-17 (×5): 5 mg via ORAL
  Filled 2017-03-15 (×5): qty 1

## 2017-03-15 MED ORDER — DEXTROSE 5 % IV SOLN
2.0000 g | INTRAVENOUS | Status: DC
  Administered 2017-03-15 – 2017-03-16 (×2): 2 g via INTRAVENOUS
  Filled 2017-03-15 (×3): qty 2

## 2017-03-15 MED ORDER — PREMIER PROTEIN SHAKE
11.0000 [oz_av] | Freq: Two times a day (BID) | ORAL | Status: DC
  Administered 2017-03-15 – 2017-03-17 (×4): 11 [oz_av] via ORAL

## 2017-03-15 MED ORDER — INSULIN GLARGINE 100 UNIT/ML ~~LOC~~ SOLN
12.0000 [IU] | Freq: Every day | SUBCUTANEOUS | Status: DC
  Administered 2017-03-15: 12 [IU] via SUBCUTANEOUS
  Filled 2017-03-15 (×2): qty 0.12

## 2017-03-15 MED ORDER — ADULT MULTIVITAMIN W/MINERALS CH
1.0000 | ORAL_TABLET | Freq: Every day | ORAL | Status: DC
  Administered 2017-03-15 – 2017-03-17 (×3): 1 via ORAL
  Filled 2017-03-15 (×3): qty 1

## 2017-03-15 NOTE — Plan of Care (Signed)
  Progressing Health Behavior/Discharge Planning: Ability to manage health-related needs will improve 03/15/2017 1327 - Progressing by Cherylann Parr, RN Clinical Measurements: Ability to maintain clinical measurements within normal limits will improve 03/15/2017 1327 - Progressing by Cherylann Parr, RN Will remain free from infection 03/15/2017 1327 - Progressing by Cherylann Parr, RN Diagnostic test results will improve 03/15/2017 1327 - Progressing by Cherylann Parr, RN Respiratory complications will improve 03/15/2017 1327 - Progressing by Cherylann Parr, RN Cardiovascular complication will be avoided 03/15/2017 1327 - Progressing by Cherylann Parr, RN Activity: Risk for activity intolerance will decrease 03/15/2017 1327 - Progressing by Cherylann Parr, RN Nutrition: Adequate nutrition will be maintained 03/15/2017 1327 - Progressing by Cherylann Parr, RN Coping: Level of anxiety will decrease 03/15/2017 1327 - Progressing by Cherylann Parr, RN Elimination: Will not experience complications related to bowel motility 03/15/2017 1327 - Progressing by Cherylann Parr, RN Will not experience complications related to urinary retention 03/15/2017 1327 - Progressing by Cherylann Parr, RN Pain Managment: General experience of comfort will improve 03/15/2017 1327 - Progressing by Cherylann Parr, RN Safety: Ability to remain free from injury will improve 03/15/2017 1327 - Progressing by Cherylann Parr, RN Skin Integrity: Risk for impaired skin integrity will decrease 03/15/2017 1327 - Progressing by Cherylann Parr, RN

## 2017-03-15 NOTE — Progress Notes (Signed)
Urology Consult Follow Up  Subjective: Nephrostomy tube successfully exchanged yesterday.  Excellent urine output, right greater than left which is baseline.  T-max 102, T-current 100.5.  Overall, does feel better.  Anti-infectives: Anti-infectives (From admission, onward)   Start     Dose/Rate Route Frequency Ordered Stop   03/15/17 1800  cefTRIAXone (ROCEPHIN) 2 g in dextrose 5 % 50 mL IVPB     2 g 100 mL/hr over 30 Minutes Intravenous Every 24 hours 03/15/17 0813     03/14/17 1800  cefTRIAXone (ROCEPHIN) 1 g in dextrose 5 % 50 mL IVPB  Status:  Discontinued     1 g 100 mL/hr over 30 Minutes Intravenous Every 24 hours 03/14/17 1712 03/15/17 0813   03/14/17 1330  vancomycin (VANCOCIN) IVPB 1000 mg/200 mL premix     1,000 mg 200 mL/hr over 60 Minutes Intravenous  Once 03/14/17 1327 03/14/17 1658   03/14/17 1330  piperacillin-tazobactam (ZOSYN) IVPB 3.375 g     3.375 g 100 mL/hr over 30 Minutes Intravenous  Once 03/14/17 1327 03/14/17 1451      Current Facility-Administered Medications  Medication Dose Route Frequency Provider Last Rate Last Dose  . acetaminophen (TYLENOL) tablet 650 mg  650 mg Oral Q6H PRN Fritzi Mandes, MD   650 mg at 03/15/17 0805   Or  . acetaminophen (TYLENOL) suppository 650 mg  650 mg Rectal Q6H PRN Fritzi Mandes, MD      . apixaban (ELIQUIS) tablet 5 mg  5 mg Oral BID Mody, Sital, MD      . cefTRIAXone (ROCEPHIN) 2 g in dextrose 5 % 50 mL IVPB  2 g Intravenous Q24H Lenis Noon, RPH      . digoxin Fonnie Birkenhead) tablet 0.25 mg  0.25 mg Oral Daily Fritzi Mandes, MD   0.25 mg at 03/15/17 0806  . HYDROcodone-acetaminophen (NORCO/VICODIN) 5-325 MG per tablet 1 tablet  1 tablet Oral Q6H PRN Fritzi Mandes, MD   1 tablet at 03/15/17 [phone]  . insulin aspart (novoLOG) injection 0-9 Units  0-9 Units Subcutaneous TID WC Fritzi Mandes, MD   3 Units at 03/15/17 1142  . insulin aspart (novoLOG) injection 3 Units  3 Units Subcutaneous TID WC Fritzi Mandes, MD   3 Units at 03/15/17 1141  .  insulin glargine (LANTUS) injection 12 Units  12 Units Subcutaneous QHS Mody, Sital, MD      . lidocaine-EPINEPHrine (XYLOCAINE-EPINEPHrine) 1 %-1:200000 (PF) injection 10 mL  10 mL Intradermal Once Fritzi Mandes, MD      . metoprolol tartrate (LOPRESSOR) tablet 25 mg  25 mg Oral BID Fritzi Mandes, MD   25 mg at 03/15/17 0805  . ondansetron (ZOFRAN) tablet 4 mg  4 mg Oral Q6H PRN Fritzi Mandes, MD       Or  . ondansetron [facility]) injection 4 mg  4 mg Intravenous Q6H PRN Fritzi Mandes, MD      . pravastatin (PRAVACHOL) tablet 20 mg  20 mg Oral QHS Fritzi Mandes, MD   20 mg at 03/14/17 2158     Objective: Vital signs in last 24 hours: Temp:  [98.5 F (36.9 C)-102 F (38.9 C)] 100.5 F (38.1 C) (11/16 0700) Pulse Rate:  [77-107] 90 (11/16 0805) Resp:  [12-14] 14 (11/16 0700) BP: (82-137)/(59-79) 119/63 (11/16 0700) SpO2:  [96 %-100 %] 99 % (11/16 0700)  Intake/Output from previous day: 11/15 0701 - 11/16 0700 In: 1286.7 [P.O.:240; I.V.:996.7; IV Piggyback:50] Out: 2425 [Urine:2425] Intake/Output this shift: No intake/output data recorded.   Physical  Exam  No acute distress.  Alert and oriented.  Sitting up at bedside. Abdomen soft nontender Bilateral nephrostomy tubes in place, both draining clear yellow urine.  Dressings clean dry and intact.  Lab Results:  Recent Labs    03/14/17 1137  WBC 11.3*  HGB 11.1*  HCT 33.1*  PLT 275   BMET Recent Labs    03/14/17 1137 03/15/17 0408  NA 131* 134*  K 4.1 4.0  CL 97* 103  CO2 21* 23  GLUCOSE 215* 191*  BUN 26* 22*  CREATININE 1.75* 1.74*  CALCIUM 9.3 8.3*   PT/INR No results for input(s): LABPROT, INR in the last 72 hours. ABG No results for input(s): PHART, HCO3 in the last 72 hours.  Invalid input(s): PCO2, PO2  Studies/Results: Dg Chest 2 View  Result Date: 03/14/2017 CLINICAL DATA:  Fever. History of colon cancer. Currently on chemoradiation. EXAM: CHEST  2 VIEW COMPARISON:  Chest x-ray dated January 31, 2017.  FINDINGS: Right chest wall port catheter with tip at the cavoatrial junction, unchanged. The cardiomediastinal silhouette is normal in size. Normal pulmonary vascularity. No focal consolidation, pleural effusion, or pneumothorax. No acute osseous abnormality. IMPRESSION: No active cardiopulmonary disease. Electronically Signed   By: Titus Dubin M.D.   On: 03/14/2017 12:14   US Renal  Result Date: 03/14/2017 CLINICAL DATA:  Fever chills and nephrostomy tube EXAM: RENAL / URINARY TRACT ULTRASOUND COMPLETE COMPARISON:  PET-CT 01/22/2017 FINDINGS: Right Kidney: Length: 11.9 cm. Cortical echogenicity within normal limits. Partially visualize right nephrostomy tube without significant hydronephrosis or perinephric collection. Left Kidney: Length: 10.3 cm. Cortical echogenicity within normal limits. Partially visualized nephrostomy tube in the lower pole. No perinephric collection Bladder: Poorly defined with probable wall thickening. IMPRESSION: 1. Bilateral partially visualized nephrostomy tubes. No significant hydronephrosis. No perinephric fluid collections by sonography 2. Poorly visible urinary bladder. Electronically Signed   By: Donavan Foil M.D.   On: 03/14/2017 15:16   Ir Nephrostomy Exchange Left  Result Date: 03/14/2017 INDICATION: History of rectal cancer with bilateral obstructive uropathy, post bilateral ultrasound fluoroscopic guided nephrostomy catheter placement on 01/19/2017 by Dr. Kathlene Cote. Patient now presents difference department with concern for impending urosepsis neck soft, request made for fluoroscopic guided exchange of bilateral percutaneous nephrostomy catheters. EXAM: FLUOROSCOPIC GUIDED BILATERAL SIDED NEPHROSTOMY CATHETER EXCHANGE COMPARISON:  None. CONTRAST:  A total of 20 mL Isovue-300 administered was administered into both collecting systems FLUOROSCOPY TIME:  3 minutes 30 seconds (161 mGy) COMPLICATIONS: None immediate. TECHNIQUE: Informed written consent was obtained  from the patient after a discussion of the risks, benefits and alternatives to treatment. Questions regarding the procedure were encouraged and answered. A timeout was performed prior to the initiation of the procedure. The bilateral flanks and external portions of existing nephrostomy catheters were prepped and draped in the usual sterile fashion. A sterile drape was applied covering the operative field. Maximum barrier sterile technique with sterile gowns and gloves were used for the procedure. A timeout was performed prior to the initiation of the procedure. A pre procedural spot fluoroscopic image was obtained. Beginning with the left-sided nephrostomy, a small amount of contrast was injected via the existing left-sided nephrostomy catheter demonstrating appropriate positioning within the renal pelvis. The existing nephrostomy catheter was cut and cannulated with a Benson wire which was coiled within the renal pelvis. Under intermittent fluoroscopic guidance, the existing nephrostomy catheter was exchanged for a new 10.2 [place] all-purpose drainage catheter. Limited contrast injection confirmed appropriate positioning within the left renal pelvis and a  post exchange fluoroscopic image was obtained. The catheter was locked, secured to the skin with an interrupted suture and reconnected to a gravity bag. The identical repeat procedure was repeated for the contralateral right-sided nephrostomy, ultimately allowing successful exchange of a new 10.2 [place] all-purpose drainage catheter with end coiled and locked within the right renal pelvis. Dressings were placed. The patient tolerated the above procedures well without immediate postprocedural complication. FINDINGS: The existing nephrostomy catheters are appropriately positioned and functioning. After successful fluoroscopic guided exchange, new bilateral 10.2 [place] nephrostomy catheters are coiled and locked within the respective renal pelvises. IMPRESSION:  Successful fluoroscopic guided exchange of bilateral 10.2 French percutaneous nephrostomy catheters. Electronically Signed   By: Sandi Mariscal M.D.   On: 03/14/2017 16:59   Ir Nephrostomy Exchange Right  Result Date: 03/14/2017 INDICATION: History of rectal cancer with bilateral obstructive uropathy, post bilateral ultrasound fluoroscopic guided nephrostomy catheter placement on 01/19/2017 by Dr. Kathlene Cote. Patient now presents difference department with concern for impending urosepsis neck soft, request made for fluoroscopic guided exchange of bilateral percutaneous nephrostomy catheters. EXAM: FLUOROSCOPIC GUIDED BILATERAL SIDED NEPHROSTOMY CATHETER EXCHANGE COMPARISON:  None. CONTRAST:  A total of 20 mL Isovue-300 administered was administered into both collecting systems FLUOROSCOPY TIME:  3 minutes 30 seconds (161 mGy) COMPLICATIONS: None immediate. TECHNIQUE: Informed written consent was obtained from the patient after a discussion of the risks, benefits and alternatives to treatment. Questions regarding the procedure were encouraged and answered. A timeout was performed prior to the initiation of the procedure. The bilateral flanks and external portions of existing nephrostomy catheters were prepped and draped in the usual sterile fashion. A sterile drape was applied covering the operative field. Maximum barrier sterile technique with sterile gowns and gloves were used for the procedure. A timeout was performed prior to the initiation of the procedure. A pre procedural spot fluoroscopic image was obtained. Beginning with the left-sided nephrostomy, a small amount of contrast was injected via the existing left-sided nephrostomy catheter demonstrating appropriate positioning within the renal pelvis. The existing nephrostomy catheter was cut and cannulated with a Benson wire which was coiled within the renal pelvis. Under intermittent fluoroscopic guidance, the existing nephrostomy catheter was exchanged for a  new 10.2 [place] all-purpose drainage catheter. Limited contrast injection confirmed appropriate positioning within the left renal pelvis and a post exchange fluoroscopic image was obtained. The catheter was locked, secured to the skin with an interrupted suture and reconnected to a gravity bag. The identical repeat procedure was repeated for the contralateral right-sided nephrostomy, ultimately allowing successful exchange of a new 10.2 [place] all-purpose drainage catheter with end coiled and locked within the right renal pelvis. Dressings were placed. The patient tolerated the above procedures well without immediate postprocedural complication. FINDINGS: The existing nephrostomy catheters are appropriately positioned and functioning. After successful fluoroscopic guided exchange, new bilateral 10.2 French nephrostomy catheters are coiled and locked within the respective renal pelvises. IMPRESSION: Successful fluoroscopic guided exchange of bilateral 10.2 French percutaneous nephrostomy catheters. Electronically Signed   By: Sandi Mariscal M.D.   On: 03/14/2017 16:59     Assessment: 66 year old male admitted with sepsis presumably related to urinary source.  He is on broad-spectrum antibiotics currently.  Nephrostomy tubes have been exchanged.  Discussed purulent discharge from penis, will likely sloughing of tumor material which may or may not be infected.  Bladder poorly visible on ultrasound which means its decompressed thus not concerning for pyocystis.  Plan: -Continue supportive care with IV antibiotics including coverage for yeast -We will  plan to likely increase interval of nephrostomy tube exchanges, possibly to every 6 weeks.  He has an appoint with me in mid December at which time we will arrange his next exchange.  Urology will sign off at this point in time.  Please contact us with any questions or concerns.     LOS: 1 day    Hollice Espy 03/15/2017

## 2017-03-15 NOTE — Consult Note (Signed)
[name] CONSULT NOTE  Patient Care Team: Dion Body, MD as PCP - General (Family Medicine) Clent Jacks, RN as Registered Nurse  CHIEF COMPLAINTS/PURPOSE OF CONSULTATION:  Rectal cancer  HISTORY OF PRESENTING ILLNESS:  Donald Townsend 66 y.o.  male with recently diagnosed metastatic rectal cancer currently on first-line chemotherapy with FOLFIRI-status post 2 cycles; currently undergoing palliative radiation to the bladder-for hematuria.  Patient currently admitted the hospital for fevers and chills-likely to be of urinary source.   Patient has been evaluated by urology; patient has exchange of his percutaneous nephrostomy tubes yesterday.  He is tolerated the procedure well.  He notes to have a fever again of 102 this afternoon.  Denies any chills.  Complains of flushing.  Appetite is poor.  No nausea no vomiting.  No headaches.  No diarrhea.  Patient is currently on broad-spectrum antibiotics/IV fluids.  ROS: A complete 10 point review of system is done which is negative except mentioned above in history of present illness  MEDICAL HISTORY:  Past Medical History:  Diagnosis Date  . A-fib ([place])   . Cancer [facility])    Colon  . CHF (congestive heart failure) ([place])   . Diabetes mellitus without complication ([place])   . Dyspnea   . Elevated lipids   . Hypertension   . PVD (peripheral vascular disease) ([place])     SURGICAL HISTORY: Past Surgical History:  Procedure Laterality Date  . COLON RESECTION     with colostomy  . COLONOSCOPY    . CYSTOSCOPY WITH STENT PLACEMENT Bilateral 01/18/2017   Performed by [name], MD at [facility] ORS  . IR FLUORO GUIDE PORT INSERTION RIGHT  01/24/2017  . IR NEPHROSTOMY EXCHANGE LEFT  03/14/2017  . IR NEPHROSTOMY EXCHANGE RIGHT  03/14/2017  . IR NEPHROSTOMY PLACEMENT LEFT  01/19/2017  . IR NEPHROSTOMY PLACEMENT RIGHT  01/19/2017  . IVC Filter Removal N/A 08/28/2016   Performed by Katha Cabal, MD at [place] [facility]  . Lower Extremity Venography Left 12/18/2016   Performed by Katha Cabal, MD at [place] [facility]  . Lower Extremity Venography Left 05/08/2016   Performed by Delana Meyer Dolores Lory, MD at [place] [facility]    SOCIAL HISTORY: Social History   Socioeconomic History  . Marital status: Divorced    Spouse name: Not on file  . Number of children: Not on file  . Years of education: Not on file  . Highest education level: Not on file  Social Needs  . Financial resource strain: Not on file  . Food insecurity - worry: Not on file  . Food insecurity - inability: Not on file  . Transportation needs - medical: Not on file  . Transportation needs - non-medical: Not on file  Occupational History  . Not on file  Tobacco Use  . Smoking status: Never Smoker  . Smokeless tobacco: Never Used  Substance and Sexual Activity  . Alcohol use: No  . Drug use: No  . Sexual activity: Not on file  Other Topics Concern  . Not on file  Social History Narrative  . Not on file    FAMILY HISTORY: Family History  Problem Relation Age of Onset  . Diabetes Mother   . Stroke Mother   . Prostate cancer Brother   . Cancer Sister        Breast cancer  . Kidney cancer Neg Hx   . Bladder Cancer Neg Hx  ALLERGIES:  has No Known Allergies.  MEDICATIONS:  Current Facility-Administered Medications  Medication Dose Route Frequency Provider Last Rate Last Dose  . acetaminophen (TYLENOL) tablet 650 mg  650 mg Oral Q6H PRN Fritzi Mandes, MD   650 mg at 03/15/17 1530   Or  . acetaminophen (TYLENOL) suppository 650 mg  650 mg Rectal Q6H PRN Fritzi Mandes, MD      . apixaban Arne Cleveland) tablet 5 mg  5 mg Oral BID Bettey Costa, MD   5 mg at 03/15/17 1210  . cefTRIAXone (ROCEPHIN) 2 g in dextrose 5 % 50 mL IVPB  2 g Intravenous Q24H Lenis Noon, [facility]   Stopped at 03/15/17 1749  . digoxin (LANOXIN) tablet 0.25 mg  0.25 mg Oral Daily Fritzi Mandes, MD   0.25 mg at 03/15/17 0806  .  HYDROcodone-acetaminophen (NORCO/VICODIN) 5-325 MG per tablet 1 tablet  1 tablet Oral Q6H PRN Fritzi Mandes, MD   1 tablet at 03/15/17 1530  . insulin aspart (novoLOG) injection 0-9 Units  0-9 Units Subcutaneous TID WC Fritzi Mandes, MD   3 Units at 03/15/17 1722  . insulin aspart (novoLOG) injection 3 Units  3 Units Subcutaneous TID WC Fritzi Mandes, MD   3 Units at 03/15/17 1721  . insulin glargine (LANTUS) injection 12 Units  12 Units Subcutaneous QHS Mody, Sital, MD      . lidocaine-EPINEPHrine (XYLOCAINE-EPINEPHrine) 1 %-1:200000 (PF) injection 10 mL  10 mL Intradermal Once Fritzi Mandes, MD      . metoprolol tartrate (LOPRESSOR) tablet 25 mg  25 mg Oral BID Fritzi Mandes, MD   25 mg at 03/15/17 0805  . multivitamin with minerals tablet 1 tablet  1 tablet Oral Daily Bettey Costa, MD   1 tablet at 03/15/17 1721  . ondansetron (ZOFRAN) tablet 4 mg  4 mg Oral Q6H PRN Fritzi Mandes, MD       Or  . ondansetron [facility]) injection 4 mg  4 mg Intravenous Q6H PRN Fritzi Mandes, MD      . pravastatin (PRAVACHOL) tablet 20 mg  20 mg Oral QHS Fritzi Mandes, MD   20 mg at 03/14/17 2158  . protein supplement (PREMIER PROTEIN) liquid  11 oz Oral BID BM Bettey Costa, MD   11 oz at 03/15/17 1523      .  PHYSICAL EXAMINATION:  Vitals:   03/15/17 1526 03/15/17 1728  BP: 136/71   Pulse: 85   Resp: 20   Temp: (!) 102.4 F (39.1 C) 100.2 F (37.9 C)  SpO2: 99%    Filed Weights   03/14/17 1114  Weight: 250 lb (113.4 kg)    GENERAL: Well-nourished well-developed; Alert, no distress and comfortable.   Alone.  Appears flushed. EYES: no pallor or icterus OROPHARYNX: no thrush or ulceration. NECK: supple, no masses felt LYMPH:  no palpable lymphadenopathy in the cervical, axillary or inguinal regions LUNGS: decreased breath sounds to auscultation at bases and  No wheeze or crackles HEART/CVS: regular rate & rhythm and no murmurs; No lower extremity edema ABDOMEN: abdomen soft, non-tender and normal bowel sounds;  bilateral percutaneous nephrostomy tubes; right draining more than left. Musculoskeletal:no cyanosis of digits and no clubbing  PSYCH: alert & oriented x 3 with fluent speech NEURO: no focal motor/sensory deficits SKIN:  no rashes or significant lesions  LABORATORY DATA:  I have reviewed the data as listed Lab Results  Component Value Date   WBC 11.3 (H) 03/14/2017   HGB 11.1 (L) 03/14/2017   HCT 33.1 (L)  03/14/2017   MCV 85.9 03/14/2017   PLT 275 03/14/2017   Recent Labs    02/18/17 0802 02/25/17 1034 03/14/17 1137 03/15/17 0408  NA 136 133* 131* 134*  K 3.7 4.0 4.1 4.0  CL 103 100* 97* 103  CO2 25 22 21* 23  GLUCOSE 212* 221* 215* 191*  BUN 20 22* 26* 22*  CREATININE 1.55* 1.61* 1.75* 1.74*  CALCIUM 9.4 9.0 9.3 8.3*  GFRNONAA 45* 43* 39* 39*  GFRAA 52* 50* 45* 45*  PROT 7.4 7.0 7.4  --   ALBUMIN 3.4* 3.4* 3.4*  --   AST 27 26 24   --   ALT 20 17 15*  --   ALKPHOS 80 75 71  --   BILITOT 1.0 1.1 1.0  --     RADIOGRAPHIC STUDIES: I have personally reviewed the radiological images as listed and agreed with the findings in the report. Dg Chest 2 View  Result Date: 03/14/2017 CLINICAL DATA:  Fever. History of colon cancer. Currently on chemoradiation. EXAM: CHEST  2 VIEW COMPARISON:  Chest x-ray dated January 31, 2017. FINDINGS: Right chest wall port catheter with tip at the cavoatrial junction, unchanged. The cardiomediastinal silhouette is normal in size. Normal pulmonary vascularity. No focal consolidation, pleural effusion, or pneumothorax. No acute osseous abnormality. IMPRESSION: No active cardiopulmonary disease. Electronically Signed   By: Titus Dubin M.D.   On: 03/14/2017 12:14   US Renal  Result Date: 03/14/2017 CLINICAL DATA:  Fever chills and nephrostomy tube EXAM: RENAL / URINARY TRACT ULTRASOUND COMPLETE COMPARISON:  PET-CT 01/22/2017 FINDINGS: Right Kidney: Length: 11.9 cm. Cortical echogenicity within normal limits. Partially visualize right nephrostomy  tube without significant hydronephrosis or perinephric collection. Left Kidney: Length: 10.3 cm. Cortical echogenicity within normal limits. Partially visualized nephrostomy tube in the lower pole. No perinephric collection Bladder: Poorly defined with probable wall thickening. IMPRESSION: 1. Bilateral partially visualized nephrostomy tubes. No significant hydronephrosis. No perinephric fluid collections by sonography 2. Poorly visible urinary bladder. Electronically Signed   By: Donavan Foil M.D.   On: 03/14/2017 15:16   Ir Nephrostomy Exchange Left  Result Date: 03/14/2017 INDICATION: History of rectal cancer with bilateral obstructive uropathy, post bilateral ultrasound fluoroscopic guided nephrostomy catheter placement on 01/19/2017 by Dr. Kathlene Cote. Patient now presents difference department with concern for impending urosepsis neck soft, request made for fluoroscopic guided exchange of bilateral percutaneous nephrostomy catheters. EXAM: FLUOROSCOPIC GUIDED BILATERAL SIDED NEPHROSTOMY CATHETER EXCHANGE COMPARISON:  None. CONTRAST:  A total of 20 mL Isovue-300 administered was administered into both collecting systems FLUOROSCOPY TIME:  3 minutes 30 seconds (161 mGy) COMPLICATIONS: None immediate. TECHNIQUE: Informed written consent was obtained from the patient after a discussion of the risks, benefits and alternatives to treatment. Questions regarding the procedure were encouraged and answered. A timeout was performed prior to the initiation of the procedure. The bilateral flanks and external portions of existing nephrostomy catheters were prepped and draped in the usual sterile fashion. A sterile drape was applied covering the operative field. Maximum barrier sterile technique with sterile gowns and gloves were used for the procedure. A timeout was performed prior to the initiation of the procedure. A pre procedural spot fluoroscopic image was obtained. Beginning with the left-sided nephrostomy, a small  amount of contrast was injected via the existing left-sided nephrostomy catheter demonstrating appropriate positioning within the renal pelvis. The existing nephrostomy catheter was cut and cannulated with a Benson wire which was coiled within the renal pelvis. Under intermittent fluoroscopic guidance, the existing nephrostomy catheter  was exchanged for a new 10.2 [place] all-purpose drainage catheter. Limited contrast injection confirmed appropriate positioning within the left renal pelvis and a post exchange fluoroscopic image was obtained. The catheter was locked, secured to the skin with an interrupted suture and reconnected to a gravity bag. The identical repeat procedure was repeated for the contralateral right-sided nephrostomy, ultimately allowing successful exchange of a new 10.2 [place] all-purpose drainage catheter with end coiled and locked within the right renal pelvis. Dressings were placed. The patient tolerated the above procedures well without immediate postprocedural complication. FINDINGS: The existing nephrostomy catheters are appropriately positioned and functioning. After successful fluoroscopic guided exchange, new bilateral 10.2 French nephrostomy catheters are coiled and locked within the respective renal pelvises. IMPRESSION: Successful fluoroscopic guided exchange of bilateral 10.2 French percutaneous nephrostomy catheters. Electronically Signed   By: Sandi Mariscal M.D.   On: 03/14/2017 16:59   Ir Nephrostomy Exchange Right  Result Date: 03/14/2017 INDICATION: History of rectal cancer with bilateral obstructive uropathy, post bilateral ultrasound fluoroscopic guided nephrostomy catheter placement on 01/19/2017 by Dr. Kathlene Cote. Patient now presents difference department with concern for impending urosepsis neck soft, request made for fluoroscopic guided exchange of bilateral percutaneous nephrostomy catheters. EXAM: FLUOROSCOPIC GUIDED BILATERAL SIDED NEPHROSTOMY CATHETER EXCHANGE  COMPARISON:  None. CONTRAST:  A total of 20 mL Isovue-300 administered was administered into both collecting systems FLUOROSCOPY TIME:  3 minutes 30 seconds (240 mGy) COMPLICATIONS: None immediate. TECHNIQUE: Informed written consent was obtained from the patient after a discussion of the risks, benefits and alternatives to treatment. Questions regarding the procedure were encouraged and answered. A timeout was performed prior to the initiation of the procedure. The bilateral flanks and external portions of existing nephrostomy catheters were prepped and draped in the usual sterile fashion. A sterile drape was applied covering the operative field. Maximum barrier sterile technique with sterile gowns and gloves were used for the procedure. A timeout was performed prior to the initiation of the procedure. A pre procedural spot fluoroscopic image was obtained. Beginning with the left-sided nephrostomy, a small amount of contrast was injected via the existing left-sided nephrostomy catheter demonstrating appropriate positioning within the renal pelvis. The existing nephrostomy catheter was cut and cannulated with a Benson wire which was coiled within the renal pelvis. Under intermittent fluoroscopic guidance, the existing nephrostomy catheter was exchanged for a new 10.2 [place] all-purpose drainage catheter. Limited contrast injection confirmed appropriate positioning within the left renal pelvis and a post exchange fluoroscopic image was obtained. The catheter was locked, secured to the skin with an interrupted suture and reconnected to a gravity bag. The identical repeat procedure was repeated for the contralateral right-sided nephrostomy, ultimately allowing successful exchange of a new 10.2 [place] all-purpose drainage catheter with end coiled and locked within the right renal pelvis. Dressings were placed. The patient tolerated the above procedures well without immediate postprocedural complication. FINDINGS: The  existing nephrostomy catheters are appropriately positioned and functioning. After successful fluoroscopic guided exchange, new bilateral 10.2 French nephrostomy catheters are coiled and locked within the respective renal pelvises. IMPRESSION: Successful fluoroscopic guided exchange of bilateral 10.2 French percutaneous nephrostomy catheters. Electronically Signed   By: Sandi Mariscal M.D.   On: 03/14/2017 16:59    ASSESSMENT & PLAN:   #66 year old male patient with metastatic rectal cancer-currently admitted to the hospital for fevers/sepsis  # Metastatic rectal cancer currently on first-line chemotherapy-FOLFIRI-status post 2 cycles.  Due for chemotherapy cycle #3 on 11/19.  Recommend holding chemotherapy given the active infection/sepsis.  Check a CEA  today.  #Sepsis/infection-likely urinary source.  Status post bilateral percutaneous nephrostomy tube exchange yesterday.  On broad-spectrum antibiotics.  Await cultures.  #Overall prognosis poor given the frequent admission to the hospital for sepsis/UTI infections.  High risk for complications from chemotherapy-in context of other comorbidities including/COPD/CHF/chronic kidney disease.  Thank you Dr. Benjie Karvonen for allowing me to participate in the care of your pleasant patient. Please do not hesitate to contact me with questions or concerns in the interim.  All questions were answered. The patient knows to call the clinic with any problems, questions or concerns.  Dr. Grayland Ormond is on call over the weekend for any immediate questions or concerns.     Cammie Sickle, MD 03/15/2017 6:34 PM

## 2017-03-15 NOTE — Progress Notes (Signed)
Initial Nutrition Assessment  DOCUMENTATION CODES:   Obesity unspecified  INTERVENTION:  1. Premier Protein BID, each supplement provides 160 calories and 30 grams of protein 2. MVI w/ Minerals  NUTRITION DIAGNOSIS:   Increased nutrient needs related to chronic illness as evidenced by estimated needs  GOAL:   Patient will meet greater than or equal to 90% of their needs  MONITOR:   PO intake, Skin, Labs, I & O's, Weight trends, Supplement acceptance  REASON FOR ASSESSMENT:   Malnutrition Screening Tool    ASSESSMENT:   Donald Townsend  is a 66 y.o. male with a known history of metastatic rectal cancer with  adenopathy along with bilateral hydronephrosis from underlying pelvic mass requiring percutaneous nephrostomy tube placement in September 2018. He presents with fever x2 days, nephrostomy tubes not draining well. Nephrostomy exchange via IR yesterday. Suspected pyelonephritis by urology.  Spoke with Donald Townsend at bedside. He reports losing 50 pounds over several months. States his usual weight is around 285 pounds, now down to 250 pounds over the last 9 or 10 months. Indicating a 35 pound/12% insignificant weight loss. Ate 50% of his meal today, appeared to be roast beef and veggies. Normally eats eggs, apple sauce, and orange juice for breakfast, a sandwich for lunch and a sandwich for dinner. Drinks ensure x2 per day at home as well. Unsure of accuracy, patient was having trouble recalling PO intake.   Labs reviewed:  CBGs 181, 215; Na 134; Medications reviewed and include:  Insulin  Chemo plan: Folfiri/Bevacizumab   Intake/Output Summary (Last 24 hours) at 03/15/2017 1447 Last data filed at 03/15/2017 1210 Gross per 24 hour  Intake 2061.67 ml  Output 2425 ml  Net -363.33 ml     NUTRITION - FOCUSED PHYSICAL EXAM:    Most Recent Value  Orbital Region  No depletion  Upper Arm Region  No depletion  Thoracic and Lumbar Region  No depletion  Buccal Region  No  depletion  Temple Region  No depletion  Clavicle Bone Region  No depletion  Clavicle and Acromion Bone Region  No depletion  Scapular Bone Region  No depletion  Dorsal Hand  No depletion  Patellar Region  No depletion  Anterior Thigh Region  No depletion  Posterior Calf Region  No depletion  Edema (RD Assessment)  Mild  Hair  Reviewed  Eyes  Reviewed  Mouth  Reviewed  Skin  Reviewed  Nails  Reviewed       Diet Order:  Diet Carb Modified Fluid consistency: Thin; Room service appropriate? Yes  EDUCATION NEEDS:   Education needs have been addressed  Skin:  Skin Assessment: Reviewed RN Assessment  Last BM:  03/14/2017 (colostomy)  Height:   Ht Readings from Last 1 Encounters:  03/14/17 5\' 10"  (1.778 m)    Weight:   Wt Readings from Last 1 Encounters:  03/14/17 250 lb (113.4 kg)    Ideal Body Weight:  75.45 kg  BMI:  Body mass index is 35.87 kg/m.  Estimated Nutritional Needs:   Kcal:  2200-2600 calories  Protein:  115-145 grams  Fluid:  2.2-2.6L   Donald Anis. Sukhraj Esquivias, MS, RD LDN Inpatient Clinical Dietitian Pager [phone]

## 2017-03-15 NOTE — Progress Notes (Signed)
Donald Townsend at [place] NAME: Donald Townsend    MR#:  [id number]  DATE OF BIRTH:  [birth year]  SUBJECTIVE:   Patient had fever this morning. Doing well otherwise.  REVIEW OF SYSTEMS:    Review of Systems  Constitutional: Positive for fever no chills or generalized weakness HENT: Negative for ear pain, nosebleeds, congestion, facial swelling, rhinorrhea, neck pain, neck stiffness and ear discharge.   Respiratory: Negative for cough, shortness of breath, wheezing  Cardiovascular: Negative for chest pain, palpitations and leg swelling.  Gastrointestinal: Negative for heartburn, abdominal pain, vomiting, diarrhea or consitpation Genitourinary: Negative for dysuria, urgency, frequency, hematuria Musculoskeletal: Negative for back pain or joint pain Neurological: Negative for dizziness, seizures, syncope, focal weakness,  numbness and headaches.  Hematological: Does not bruise/bleed easily.  Psychiatric/Behavioral: Negative for hallucinations, confusion, dysphoric mood    Tolerating Diet: yes      DRUG ALLERGIES:  No Known Allergies  VITALS:  Blood pressure 119/63, pulse 90, temperature (!) 100.5 F (38.1 C), temperature source Oral, resp. rate 14, height 5\' 10"  (1.778 m), weight 113.4 kg (250 lb), SpO2 99 %.  PHYSICAL EXAMINATION:  Constitutional: Appears well-developed and well-nourished. No distress. HENT: Normocephalic. [name] Oropharynx is clear and moist.  Eyes: Conjunctivae and EOM are normal. PERRLA, no scleral icterus.  Neck: Normal ROM. Neck supple. No JVD. No tracheal deviation. CVS: RRR, S1/S2 +, no murmurs, no gallops, no carotid bruit.  Pulmonary: Effort and breath sounds normal, no stridor, rhonchi, wheezes, rales.  Abdominal: Soft. BS +,  no distension, tenderness, rebound or guarding.  Musculoskeletal: Normal range of motion. No edema and no tenderness.  B/L Nephrostomy tubes  Neuro: Alert. CN 2-12 grossly intact. No focal  deficits. Skin: Skin is warm and dry. No rash noted. Psychiatric: Normal mood and affect.      LABORATORY PANEL:   CBC Recent Labs  Lab 03/14/17 1137  WBC 11.3*  HGB 11.1*  HCT 33.1*  PLT 275   ------------------------------------------------------------------------------------------------------------------  Chemistries  Recent Labs  Lab 03/14/17 1137 03/15/17 0408  NA 131* 134*  K 4.1 4.0  CL 97* 103  CO2 21* 23  GLUCOSE 215* 191*  BUN 26* 22*  CREATININE 1.75* 1.74*  CALCIUM 9.3 8.3*  AST 24  --   ALT 15*  --   ALKPHOS 71  --   BILITOT 1.0  --    ------------------------------------------------------------------------------------------------------------------  Cardiac Enzymes No results for input(s): TROPONINI in the last 168 hours. ------------------------------------------------------------------------------------------------------------------  RADIOLOGY:  Dg Chest 2 View  Result Date: 03/14/2017 CLINICAL DATA:  Fever. History of colon cancer. Currently on chemoradiation. EXAM: CHEST  2 VIEW COMPARISON:  Chest x-ray dated January 31, 2017. FINDINGS: Right chest wall port catheter with tip at the cavoatrial junction, unchanged. The cardiomediastinal silhouette is normal in size. Normal pulmonary vascularity. No focal consolidation, pleural effusion, or pneumothorax. No acute osseous abnormality. IMPRESSION: No active cardiopulmonary disease. Electronically Signed   By: Titus Dubin M.D.   On: 03/14/2017 12:14   US Renal  Result Date: 03/14/2017 CLINICAL DATA:  Fever chills and nephrostomy tube EXAM: RENAL / URINARY TRACT ULTRASOUND COMPLETE COMPARISON:  PET-CT 01/22/2017 FINDINGS: Right Kidney: Length: 11.9 cm. Cortical echogenicity within normal limits. Partially visualize right nephrostomy tube without significant hydronephrosis or perinephric collection. Left Kidney: Length: 10.3 cm. Cortical echogenicity within normal limits. Partially visualized  nephrostomy tube in the lower pole. No perinephric collection Bladder: Poorly defined with probable wall thickening. IMPRESSION: 1. Bilateral partially visualized nephrostomy  tubes. No significant hydronephrosis. No perinephric fluid collections by sonography 2. Poorly visible urinary bladder. Electronically Signed   By: Donavan Foil M.D.   On: 03/14/2017 15:16   Ir Nephrostomy Exchange Left  Result Date: 03/14/2017 INDICATION: History of rectal cancer with bilateral obstructive uropathy, post bilateral ultrasound fluoroscopic guided nephrostomy catheter placement on 01/19/2017 by Dr. Kathlene Cote. Patient now presents difference department with concern for impending urosepsis neck soft, request made for fluoroscopic guided exchange of bilateral percutaneous nephrostomy catheters. EXAM: FLUOROSCOPIC GUIDED BILATERAL SIDED NEPHROSTOMY CATHETER EXCHANGE COMPARISON:  None. CONTRAST:  A total of 20 mL Isovue-300 administered was administered into both collecting systems FLUOROSCOPY TIME:  3 minutes 30 seconds (962 mGy) COMPLICATIONS: None immediate. TECHNIQUE: Informed written consent was obtained from the patient after a discussion of the risks, benefits and alternatives to treatment. Questions regarding the procedure were encouraged and answered. A timeout was performed prior to the initiation of the procedure. The bilateral flanks and external portions of existing nephrostomy catheters were prepped and draped in the usual sterile fashion. A sterile drape was applied covering the operative field. Maximum barrier sterile technique with sterile gowns and gloves were used for the procedure. A timeout was performed prior to the initiation of the procedure. A pre procedural spot fluoroscopic image was obtained. Beginning with the left-sided nephrostomy, a small amount of contrast was injected via the existing left-sided nephrostomy catheter demonstrating appropriate positioning within the renal pelvis. The existing  nephrostomy catheter was cut and cannulated with a Benson wire which was coiled within the renal pelvis. Under intermittent fluoroscopic guidance, the existing nephrostomy catheter was exchanged for a new 10.2 [place] all-purpose drainage catheter. Limited contrast injection confirmed appropriate positioning within the left renal pelvis and a post exchange fluoroscopic image was obtained. The catheter was locked, secured to the skin with an interrupted suture and reconnected to a gravity bag. The identical repeat procedure was repeated for the contralateral right-sided nephrostomy, ultimately allowing successful exchange of a new 10.2 [place] all-purpose drainage catheter with end coiled and locked within the right renal pelvis. Dressings were placed. The patient tolerated the above procedures well without immediate postprocedural complication. FINDINGS: The existing nephrostomy catheters are appropriately positioned and functioning. After successful fluoroscopic guided exchange, new bilateral 10.2 French nephrostomy catheters are coiled and locked within the respective renal pelvises. IMPRESSION: Successful fluoroscopic guided exchange of bilateral 10.2 French percutaneous nephrostomy catheters. Electronically Signed   By: Sandi Mariscal M.D.   On: 03/14/2017 16:59   Ir Nephrostomy Exchange Right  Result Date: 03/14/2017 INDICATION: History of rectal cancer with bilateral obstructive uropathy, post bilateral ultrasound fluoroscopic guided nephrostomy catheter placement on 01/19/2017 by Dr. Kathlene Cote. Patient now presents difference department with concern for impending urosepsis neck soft, request made for fluoroscopic guided exchange of bilateral percutaneous nephrostomy catheters. EXAM: FLUOROSCOPIC GUIDED BILATERAL SIDED NEPHROSTOMY CATHETER EXCHANGE COMPARISON:  None. CONTRAST:  A total of 20 mL Isovue-300 administered was administered into both collecting systems FLUOROSCOPY TIME:  3 minutes 30 seconds (952  mGy) COMPLICATIONS: None immediate. TECHNIQUE: Informed written consent was obtained from the patient after a discussion of the risks, benefits and alternatives to treatment. Questions regarding the procedure were encouraged and answered. A timeout was performed prior to the initiation of the procedure. The bilateral flanks and external portions of existing nephrostomy catheters were prepped and draped in the usual sterile fashion. A sterile drape was applied covering the operative field. Maximum barrier sterile technique with sterile gowns and gloves were used for the procedure.  A timeout was performed prior to the initiation of the procedure. A pre procedural spot fluoroscopic image was obtained. Beginning with the left-sided nephrostomy, a small amount of contrast was injected via the existing left-sided nephrostomy catheter demonstrating appropriate positioning within the renal pelvis. The existing nephrostomy catheter was cut and cannulated with a Benson wire which was coiled within the renal pelvis. Under intermittent fluoroscopic guidance, the existing nephrostomy catheter was exchanged for a new 10.2 [place] all-purpose drainage catheter. Limited contrast injection confirmed appropriate positioning within the left renal pelvis and a post exchange fluoroscopic image was obtained. The catheter was locked, secured to the skin with an interrupted suture and reconnected to a gravity bag. The identical repeat procedure was repeated for the contralateral right-sided nephrostomy, ultimately allowing successful exchange of a new 10.2 [place] all-purpose drainage catheter with end coiled and locked within the right renal pelvis. Dressings were placed. The patient tolerated the above procedures well without immediate postprocedural complication. FINDINGS: The existing nephrostomy catheters are appropriately positioned and functioning. After successful fluoroscopic guided exchange, new bilateral 10.2 French nephrostomy  catheters are coiled and locked within the respective renal pelvises. IMPRESSION: Successful fluoroscopic guided exchange of bilateral 10.2 French percutaneous nephrostomy catheters. Electronically Signed   By: Sandi Mariscal M.D.   On: 03/14/2017 16:59     ASSESSMENT AND PLAN:   66 year old male with known history of metastatic rectal cancer and bilateral hydronephrosis from underlying pelvic mass status post percutaneous nephrostomy tube who presented with fever and right nephrostomy tube is not draining well.  1. Sepsis: Patient presents with fever and leukocytosis due to pyelonephritis from bilateral nephrostomy tube. Continue to monitor fever curve Continue Rocephin for the treatment of urinary tract infection Follow-up on final blood and urine cultures.  2. Bilateral hydronephrosis due to pelvic mass that is invading into the posterior bladder wall He had bilateral nephrostomy tube exchange yesterday. Appreciate urology and IR consult.  3.Status post lower extremity embolectomy: Continue with anticoagulation  4. Rectal cancer/pelvic mass: Oncology evaluation  5. Diabetes: Continue Lantus with sliding scale  6.PAF: Continue metoprolol, digoxin and Eliquis Followed by Dr Ubaldo Glassing  Management plans discussed with the patient and he is in agreement.  CODE STATUS: FULL  TOTAL TIME TAKING CARE OF THIS PATIENT: 30 minutes.     POSSIBLE D/C 1-2 days, DEPENDING ON CLINICAL CONDITION.   Taylen Osorto M.D on 03/15/2017 at 11:03 AM  Between 7am to 6pm - Pager - [phone] After 6pm go to [email or web address] - password [email or web address] [facility]  Office  [phone]  CC: Primary care physician; Dion Body, MD  Note: This dictation was prepared with Dragon dictation along with smaller phrase technology. Any transcriptional errors that result from this process are unintentional.

## 2017-03-15 NOTE — Progress Notes (Signed)
Inpatient Diabetes Program Recommendations  AACE/ADA: New Consensus Statement on Inpatient Glycemic Control (2015)  Target Ranges:  Prepandial:   less than 140 mg/dL      Peak postprandial:   less than 180 mg/dL (1-2 hours)      Critically ill patients:  140 - 180 mg/dL   Lab Results  Component Value Date   GLUCAP 181 (H) 03/15/2017   HGBA1C 7.5 (H) 01/19/2017    Review of Glycemic Control Results for Donald Townsend, Donald Townsend (MRN [id number]) as of 03/15/2017 10:51  Ref. Range 03/14/2017 16:09 03/14/2017 17:40 03/14/2017 21:48 03/15/2017 07:38  Glucose-Capillary Latest Ref Range: 65 - 99 mg/dL 188 (H) 213 (H) 215 (H) 181 (H)   Diabetes history: Type 2 Outpatient Diabetes medications: Amaryl 4mg  bid, Novolog 3 units tid, Lantus 10 units qhs  Current orders for Inpatient glycemic control: Amaryl 4mg  bid, Novolog 3 units tid, Lantus 10 units qhs, Novolog 0-9 units tid  Inpatient Diabetes Program Recommendations:  Please d/c Amaryl while patient is in hospital.           Add Novolog 0-5 units qhs. Increase Lantus to 12 units qhs.   Gentry Fitz, RN, BA, MHA, CDE Diabetes Coordinator Inpatient Diabetes Program  [phone] (Team Pager) [phone] (Kevin) 03/15/2017 10:54 AM

## 2017-03-15 NOTE — Progress Notes (Signed)
Ceftriaxone 1 g IV daily ordered for UTI. Blood cultures have been obtained and show no growth < 12 hours. Will increase dose to ceftriaxone 2 g IV daily. Per protocol.  Lenis Noon, PharmD 03/15/17 8:15 AM

## 2017-03-16 LAB — BASIC METABOLIC PANEL
Anion gap: 8 (ref 5–15)
BUN: 20 mg/dL (ref 6–20)
CO2: 25 mmol/L (ref 22–32)
Calcium: 8.4 mg/dL — ABNORMAL LOW (ref 8.9–10.3)
Chloride: 103 mmol/L (ref 101–111)
Creatinine, Ser: 1.58 mg/dL — ABNORMAL HIGH (ref 0.61–1.24)
GFR calc Af Amer: 51 mL/min — ABNORMAL LOW (ref >60)
GFR, EST NON AFRICAN AMERICAN: 44 mL/min — AB (ref >60)
Glucose, Bld: 219 mg/dL — ABNORMAL HIGH (ref 65–99)
POTASSIUM: 4 mmol/L (ref 3.5–5.1)
SODIUM: 136 mmol/L (ref 135–145)

## 2017-03-16 LAB — CBC
HCT: 30 % — ABNORMAL LOW (ref 40.0–52.0)
Hemoglobin: 9.9 g/dL — ABNORMAL LOW (ref 13.0–18.0)
MCH: 28.8 pg (ref 26.0–34.0)
MCHC: 33.1 g/dL (ref 32.0–36.0)
MCV: 86.9 fL (ref 80.0–100.0)
PLATELETS: 215 10*3/uL (ref 150–440)
RBC: 3.45 MIL/uL — AB (ref 4.40–5.90)
RDW: 16 % — ABNORMAL HIGH (ref 11.5–14.5)
WBC: 7.1 10*3/uL (ref 3.8–10.6)

## 2017-03-16 LAB — GASTROINTESTINAL PANEL BY PCR, STOOL (REPLACES STOOL CULTURE)

## 2017-03-16 LAB — GLUCOSE, CAPILLARY
GLUCOSE-CAPILLARY: 195 mg/dL — AB (ref 65–99)
GLUCOSE-CAPILLARY: 237 mg/dL — AB (ref 65–99)
GLUCOSE-CAPILLARY: 249 mg/dL — AB (ref 65–99)
Glucose-Capillary: 230 mg/dL — ABNORMAL HIGH (ref 65–99)

## 2017-03-16 MED ORDER — FLUCONAZOLE 100 MG PO TABS
200.0000 mg | ORAL_TABLET | Freq: Every day | ORAL | Status: DC
  Administered 2017-03-16 – 2017-03-17 (×2): 200 mg via ORAL
  Filled 2017-03-16: qty 1
  Filled 2017-03-16 (×2): qty 2

## 2017-03-16 MED ORDER — RISAQUAD PO CAPS
1.0000 | ORAL_CAPSULE | Freq: Every day | ORAL | Status: DC
  Administered 2017-03-17: 09:00:00 1 via ORAL
  Filled 2017-03-16: qty 1

## 2017-03-16 MED ORDER — INSULIN GLARGINE 100 UNIT/ML ~~LOC~~ SOLN
15.0000 [IU] | Freq: Every day | SUBCUTANEOUS | Status: DC
  Administered 2017-03-16: 21:00:00 15 [IU] via SUBCUTANEOUS
  Filled 2017-03-16 (×2): qty 0.15

## 2017-03-16 NOTE — Plan of Care (Signed)
  Progressing Health Behavior/Discharge Planning: Ability to manage health-related needs will improve 03/16/2017 1445 - Progressing by Cherylann Parr, RN Clinical Measurements: Ability to maintain clinical measurements within normal limits will improve 03/16/2017 1445 - Progressing by Cherylann Parr, RN Will remain free from infection 03/16/2017 1445 - Progressing by Cherylann Parr, RN Diagnostic test results will improve 03/16/2017 1445 - Progressing by Cherylann Parr, RN Respiratory complications will improve 03/16/2017 1445 - Progressing by Cherylann Parr, RN Cardiovascular complication will be avoided 03/16/2017 1445 - Progressing by Cherylann Parr, RN Activity: Risk for activity intolerance will decrease 03/16/2017 1445 - Progressing by Cherylann Parr, RN Nutrition: Adequate nutrition will be maintained 03/16/2017 1445 - Progressing by Cherylann Parr, RN Coping: Level of anxiety will decrease 03/16/2017 1445 - Progressing by Cherylann Parr, RN Elimination: Will not experience complications related to bowel motility 03/16/2017 1445 - Progressing by Cherylann Parr, RN Will not experience complications related to urinary retention 03/16/2017 1445 - Progressing by Cherylann Parr, RN Pain Managment: General experience of comfort will improve 03/16/2017 1445 - Progressing by Cherylann Parr, RN Safety: Ability to remain free from injury will improve 03/16/2017 1445 - Progressing by Cherylann Parr, RN Skin Integrity: Risk for impaired skin integrity will decrease 03/16/2017 1445 - Progressing by Cherylann Parr, RN

## 2017-03-16 NOTE — Progress Notes (Signed)
[facility] NAME: Donald Townsend    MR#:  [id number]  DATE OF BIRTH:  1951/03/14  SUBJECTIVE:  CHIEF COMPLAINT:   Chief Complaint  Patient presents with  . Fever   - Feels better. Continues to have loose stools in his colostomy bag. -Low-grade fever only  REVIEW OF SYSTEMS:  Review of Systems  Constitutional: Positive for fever and malaise/fatigue. Negative for chills.  HENT: Negative for congestion, ear discharge, hearing loss and nosebleeds.   Eyes: Negative for blurred vision.  Respiratory: Negative for cough, shortness of breath and wheezing.   Cardiovascular: Negative for chest pain, palpitations and leg swelling.  Gastrointestinal: Positive for diarrhea. Negative for abdominal pain, constipation, nausea and vomiting.  Genitourinary: Negative for dysuria and urgency.  Musculoskeletal: Negative for myalgias.  Neurological: Negative for dizziness, seizures and headaches.  Psychiatric/Behavioral: Negative for depression.    DRUG ALLERGIES:  No Known Allergies  VITALS:  Blood pressure 126/62, pulse 82, temperature 99.1 F (37.3 C), temperature source Oral, resp. rate 20, height 5\' 10"  (1.778 m), weight 113.4 kg (250 lb), SpO2 100 %.  PHYSICAL EXAMINATION:  Physical Exam  GENERAL:  66 y.o.-year-old patient lying in the bed with no acute distress.  EYES: Pupils equal, round, reactive to light and accommodation. No scleral icterus. Extraocular muscles intact.  HEENT: Head atraumatic, normocephalic. Oropharynx and nasopharynx clear.  NECK:  Supple, no jugular venous distention. No thyroid enlargement, no tenderness.  LUNGS: Normal breath sounds bilaterally, no wheezing, rales,rhonchi or crepitation. No use of accessory muscles of respiration.  CARDIOVASCULAR: S1, S2 normal. No murmurs, rubs, or gallops.  ABDOMEN: Soft, nontender, nondistended. Bowel sounds present. No organomegaly or mass. Has left lower quadrant colostomy  bag with loose stool Bilateral nephrostomy tubes with clear draining urine, more urine in the right bag than the left EXTREMITIES: No pedal edema, cyanosis, or clubbing.  NEUROLOGIC: Cranial nerves II through XII are intact. Muscle strength 5/5 in all extremities. Sensation intact. Gait not checked.  PSYCHIATRIC: The patient is alert and oriented x 3.  SKIN: No obvious rash, lesion, or ulcer.    LABORATORY PANEL:   CBC Recent Labs  Lab 03/16/17 0437  WBC 7.1  HGB 9.9*  HCT 30.0*  PLT 215   ------------------------------------------------------------------------------------------------------------------  Chemistries  Recent Labs  Lab 03/14/17 1137  03/16/17 0437  NA 131*   < > 136  K 4.1   < > 4.0  CL 97*   < > 103  CO2 21*   < > 25  GLUCOSE 215*   < > 219*  BUN 26*   < > 20  CREATININE 1.75*   < > 1.58*  CALCIUM 9.3   < > 8.4*  AST 24  --   --   ALT 15*  --   --   ALKPHOS 71  --   --   BILITOT 1.0  --   --    < > = values in this interval not displayed.   ------------------------------------------------------------------------------------------------------------------  Cardiac Enzymes No results for input(s): TROPONINI in the last 168 hours. ------------------------------------------------------------------------------------------------------------------  RADIOLOGY:  US Renal  Result Date: 03/14/2017 CLINICAL DATA:  Fever chills and nephrostomy tube EXAM: RENAL / URINARY TRACT ULTRASOUND COMPLETE COMPARISON:  PET-CT 01/22/2017 FINDINGS: Right Kidney: Length: 11.9 cm. Cortical echogenicity within normal limits. Partially visualize right nephrostomy tube without significant hydronephrosis or perinephric collection. Left Kidney: Length: 10.3 cm. Cortical echogenicity within normal limits. Partially visualized nephrostomy tube in the  lower pole. No perinephric collection Bladder: Poorly defined with probable wall thickening. IMPRESSION: 1. Bilateral partially visualized  nephrostomy tubes. No significant hydronephrosis. No perinephric fluid collections by sonography 2. Poorly visible urinary bladder. Electronically Signed   By: Donavan Foil M.D.   On: 03/14/2017 15:16   Ir Nephrostomy Exchange Left  Result Date: 03/14/2017 INDICATION: History of rectal cancer with bilateral obstructive uropathy, post bilateral ultrasound fluoroscopic guided nephrostomy catheter placement on 01/19/2017 by Dr. Kathlene Cote. Patient now presents difference department with concern for impending urosepsis neck soft, request made for fluoroscopic guided exchange of bilateral percutaneous nephrostomy catheters. EXAM: FLUOROSCOPIC GUIDED BILATERAL SIDED NEPHROSTOMY CATHETER EXCHANGE COMPARISON:  None. CONTRAST:  A total of 20 mL Isovue-300 administered was administered into both collecting systems FLUOROSCOPY TIME:  3 minutes 30 seconds (053 mGy) COMPLICATIONS: None immediate. TECHNIQUE: Informed written consent was obtained from the patient after a discussion of the risks, benefits and alternatives to treatment. Questions regarding the procedure were encouraged and answered. A timeout was performed prior to the initiation of the procedure. The bilateral flanks and external portions of existing nephrostomy catheters were prepped and draped in the usual sterile fashion. A sterile drape was applied covering the operative field. Maximum barrier sterile technique with sterile gowns and gloves were used for the procedure. A timeout was performed prior to the initiation of the procedure. A pre procedural spot fluoroscopic image was obtained. Beginning with the left-sided nephrostomy, a small amount of contrast was injected via the existing left-sided nephrostomy catheter demonstrating appropriate positioning within the renal pelvis. The existing nephrostomy catheter was cut and cannulated with a Benson wire which was coiled within the renal pelvis. Under intermittent fluoroscopic guidance, the existing  nephrostomy catheter was exchanged for a new 10.2 [place] all-purpose drainage catheter. Limited contrast injection confirmed appropriate positioning within the left renal pelvis and a post exchange fluoroscopic image was obtained. The catheter was locked, secured to the skin with an interrupted suture and reconnected to a gravity bag. The identical repeat procedure was repeated for the contralateral right-sided nephrostomy, ultimately allowing successful exchange of a new 10.2 [place] all-purpose drainage catheter with end coiled and locked within the right renal pelvis. Dressings were placed. The patient tolerated the above procedures well without immediate postprocedural complication. FINDINGS: The existing nephrostomy catheters are appropriately positioned and functioning. After successful fluoroscopic guided exchange, new bilateral 10.2 French nephrostomy catheters are coiled and locked within the respective renal pelvises. IMPRESSION: Successful fluoroscopic guided exchange of bilateral 10.2 French percutaneous nephrostomy catheters. Electronically Signed   By: Sandi Mariscal M.D.   On: 03/14/2017 16:59   Ir Nephrostomy Exchange Right  Result Date: 03/14/2017 INDICATION: History of rectal cancer with bilateral obstructive uropathy, post bilateral ultrasound fluoroscopic guided nephrostomy catheter placement on 01/19/2017 by Dr. Kathlene Cote. Patient now presents difference department with concern for impending urosepsis neck soft, request made for fluoroscopic guided exchange of bilateral percutaneous nephrostomy catheters. EXAM: FLUOROSCOPIC GUIDED BILATERAL SIDED NEPHROSTOMY CATHETER EXCHANGE COMPARISON:  None. CONTRAST:  A total of 20 mL Isovue-300 administered was administered into both collecting systems FLUOROSCOPY TIME:  3 minutes 30 seconds (976 mGy) COMPLICATIONS: None immediate. TECHNIQUE: Informed written consent was obtained from the patient after a discussion of the risks, benefits and alternatives to  treatment. Questions regarding the procedure were encouraged and answered. A timeout was performed prior to the initiation of the procedure. The bilateral flanks and external portions of existing nephrostomy catheters were prepped and draped in the usual sterile fashion. A sterile drape was  applied covering the operative field. Maximum barrier sterile technique with sterile gowns and gloves were used for the procedure. A timeout was performed prior to the initiation of the procedure. A pre procedural spot fluoroscopic image was obtained. Beginning with the left-sided nephrostomy, a small amount of contrast was injected via the existing left-sided nephrostomy catheter demonstrating appropriate positioning within the renal pelvis. The existing nephrostomy catheter was cut and cannulated with a Benson wire which was coiled within the renal pelvis. Under intermittent fluoroscopic guidance, the existing nephrostomy catheter was exchanged for a new 10.2 [place] all-purpose drainage catheter. Limited contrast injection confirmed appropriate positioning within the left renal pelvis and a post exchange fluoroscopic image was obtained. The catheter was locked, secured to the skin with an interrupted suture and reconnected to a gravity bag. The identical repeat procedure was repeated for the contralateral right-sided nephrostomy, ultimately allowing successful exchange of a new 10.2 [place] all-purpose drainage catheter with end coiled and locked within the right renal pelvis. Dressings were placed. The patient tolerated the above procedures well without immediate postprocedural complication. FINDINGS: The existing nephrostomy catheters are appropriately positioned and functioning. After successful fluoroscopic guided exchange, new bilateral 10.2 French nephrostomy catheters are coiled and locked within the respective renal pelvises. IMPRESSION: Successful fluoroscopic guided exchange of bilateral 10.2 French percutaneous  nephrostomy catheters. Electronically Signed   By: Sandi Mariscal M.D.   On: 03/14/2017 16:59    EKG:  No orders found for this or any previous visit.  ASSESSMENT AND PLAN:   66 year old male with past medical history significant for metastatic rectal cancer status post 2 cycles of chemotherapy and currently on radiation, obstructive uropathy with bilateral nephrostomy tubes, atrial fibrillation, status post colostomy, diabetes and hypertension and peripheral vascular disease presents to the hospital secondary to sepsis  #1 sepsis-secondary to urinary source. Likely pyelonephritis from bilateral nephrostomy tubes. -Appreciate urology consult. They recommended changing his nephrostomy tubes sooner than later from now on as this is his second infection in the last few months. -Pending blood and urine cultures. Currently on Rocephin. -Continue to monitor fever curve  #2 obstructive uropathy-with bilateral hydronephrosis due to pelvic mass. -Status post bilateral nephrostomy tubes and IR guided exchange this admission.  #3 history of paroxysmal atrial fibrillation-on metoprolol and digoxin. Rate control -On eliquis for anticoagulation  #4 rectal cancer-finished 2 cycles of chemotherapy, currently on hold due to infection. 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 However as he is on antibiotics, will check for C. difficile if GI panel. Add probiotics and monitor. If tests are negative, will add Lomotil  #6 diabetes mellitus-continue Lantus and also sliding scale insulin  #7 DVT prophylaxis has been already on eliquis    Updated daughter at bedside as well  All the records are reviewed and case discussed with Care Management/Social Workerr. Management plans discussed with the patient, family and they are in agreement.  CODE STATUS: Full Code  TOTAL TIME TAKING CARE OF THIS PATIENT: 37 minutes.   POSSIBLE D/C IN 1-2 DAYS,  DEPENDING ON CLINICAL CONDITION.   Alyrica Thurow M.D on 03/16/2017 at 1:20 PM  Between 7am to 6pm - Pager - [phone]  After 6pm go to [email or web address] - password [email or web address] [facility]  Office  [phone]  CC: Primary care physician; Dion Body, MD

## 2017-03-17 LAB — CBC
HCT: 30 % — ABNORMAL LOW (ref 40.0–52.0)
HEMOGLOBIN: 10.4 g/dL — AB (ref 13.0–18.0)
MCH: 29.4 pg (ref 26.0–34.0)
MCHC: 34.5 g/dL (ref 32.0–36.0)
MCV: 85.3 fL (ref 80.0–100.0)
PLATELETS: 242 10*3/uL (ref 150–440)
RBC: 3.52 MIL/uL — AB (ref 4.40–5.90)
RDW: 15.8 % — ABNORMAL HIGH (ref 11.5–14.5)
WBC: 7.1 10*3/uL (ref 3.8–10.6)

## 2017-03-17 LAB — GLUCOSE, CAPILLARY
Glucose-Capillary: 218 mg/dL — ABNORMAL HIGH (ref 65–99)
Glucose-Capillary: 229 mg/dL — ABNORMAL HIGH (ref 65–99)

## 2017-03-17 LAB — BASIC METABOLIC PANEL
Anion gap: 7 (ref 5–15)
BUN: 24 mg/dL — AB (ref 6–20)
CHLORIDE: 104 mmol/L (ref 101–111)
CO2: 25 mmol/L (ref 22–32)
Calcium: 8.7 mg/dL — ABNORMAL LOW (ref 8.9–10.3)
Creatinine, Ser: 1.58 mg/dL — ABNORMAL HIGH (ref 0.61–1.24)
GFR calc Af Amer: 51 mL/min — ABNORMAL LOW (ref >60)
GFR calc non Af Amer: 44 mL/min — ABNORMAL LOW (ref >60)
Glucose, Bld: 208 mg/dL — ABNORMAL HIGH (ref 65–99)
POTASSIUM: 4.2 mmol/L (ref 3.5–5.1)
Sodium: 136 mmol/L (ref 135–145)

## 2017-03-17 LAB — CEA: CEA: 79.3 ng/mL — ABNORMAL HIGH (ref 0.0–4.7)

## 2017-03-17 MED ORDER — CIPROFLOXACIN HCL 500 MG PO TABS: 500.0000 mg | ORAL_TABLET | Freq: Two times a day (BID) | ORAL | 0 refills | Status: DC

## 2017-03-17 MED ORDER — RISAQUAD PO CAPS: 1.0000 | ORAL_CAPSULE | Freq: Every day | ORAL | 0 refills | Status: DC

## 2017-03-17 MED ORDER — CYCLOBENZAPRINE HCL 7.5 MG PO TABS: 7.5000 mg | ORAL_TABLET | Freq: Three times a day (TID) | ORAL | 0 refills | Status: DC | PRN

## 2017-03-17 MED ORDER — CIPROFLOXACIN HCL 500 MG PO TABS: 500.0000 mg | ORAL_TABLET | Freq: Two times a day (BID) | ORAL | 0 refills | Status: AC

## 2017-03-17 MED ORDER — FLUCONAZOLE 200 MG PO TABS: 200.0000 mg | ORAL_TABLET | Freq: Every day | ORAL | 0 refills | Status: DC

## 2017-03-17 MED ORDER — CYCLOBENZAPRINE HCL 5 MG PO TABS
7.5000 mg | ORAL_TABLET | Freq: Three times a day (TID) | ORAL | Status: DC | PRN
  Filled 2017-03-17: qty 1.5

## 2017-03-17 NOTE — Discharge Summary (Signed)
[facility] NAME: Donald Townsend    MR#:  [id number]  DATE OF BIRTH:  February 23, 1951  DATE OF ADMISSION:  03/14/2017   ADMITTING PHYSICIAN: Fritzi Mandes, MD  DATE OF DISCHARGE: 03/17/17  PRIMARY CARE PHYSICIAN: Dion Body, MD   ADMISSION DIAGNOSIS:   Hydronephrosis [N13.30] UTI (urinary tract infection) [N39.0] Sepsis, due to unspecified organism ([place]) [A41.9]  DISCHARGE DIAGNOSIS:   Active Problems:   Sepsis ([place])   SECONDARY DIAGNOSIS:   Past Medical History:  Diagnosis Date  . A-fib ([place])   . Cancer [facility])    Colon  . CHF (congestive heart failure) ([place])   . Diabetes mellitus without complication ([place])   . Dyspnea   . Elevated lipids   . Hypertension   . PVD (peripheral vascular disease) [facility])     HOSPITAL COURSE:   65 year old male with past medical history significant for metastatic rectal cancer status post 2 cycles of chemotherapy and currently on radiation, obstructive uropathy with bilateral nephrostomy tubes, atrial fibrillation, status post colostomy, diabetes and hypertension and peripheral vascular disease presents to the hospital secondary to sepsis  #1 sepsis-secondary to urinary source. Likely pyelonephritis from bilateral nephrostomy tubes. -Appreciate urology consult. Nephrostomy tubes were exchanged by IR this admission. -Urology recommends changing his nephrostomy tubes sooner than later from now on as this is his second infection in the last few months. -No further fevers noted. Blood cultures are negative. Urine cultures growing Enterobacter aerogenes sensitive to fluoroquinolones. We'll be discharging on ciprofloxacin for 10 days. -Also some yeast was noted in the urine though no cultures were positive, however with urology recommendations-we'll treat with fluconazole for a week.  #2 obstructive uropathy-with bilateral hydronephrosis due to pelvic mass. -Status post bilateral nephrostomy  tubes and IR guided exchange this admission.  #3 history of paroxysmal atrial fibrillation-on metoprolol and digoxin. Rate control -On eliquis for anticoagulation  #4 rectal cancer-finished 2 cycles of chemotherapy, currently on hold due to infection. 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 - Improved with probiotics. Stool is not liquidy to send for C. difficile. Improved diarrhea now  #6 diabetes mellitus-continue home doses of Lantus and sliding scale. Hold glimepiride  Has been ambulating well. We'll be able to be discharged today    DISCHARGE CONDITIONS:   Guarded  CONSULTS OBTAINED:   Treatment Team:  Cammie Sickle, MD  DRUG ALLERGIES:   No Known Allergies DISCHARGE MEDICATIONS:   Allergies as of 03/17/2017   No Known Allergies     Medication List    STOP taking these medications   furosemide 40 MG tablet Commonly known as:  LASIX   glimepiride 4 MG tablet Commonly known as:  AMARYL   lidocaine-prilocaine cream Commonly known as:  EMLA   losartan 100 MG tablet Commonly known as:  COZAAR     TAKE these medications   acidophilus Caps capsule Take 1 capsule daily by mouth. Start taking on:  03/18/2017   B-D ULTRAFINE III SHORT PEN 31G X 8 MM Misc Generic drug:  Insulin Pen Needle   ciprofloxacin 500 MG tablet Commonly known as:  CIPRO Take 1 tablet (500 mg total) 2 (two) times daily for 10 days by mouth.   cyclobenzaprine 7.5 MG tablet Commonly known as:  FEXMID Take 1 tablet (7.5 mg total) 3 (three) times daily as needed by mouth for muscle spasms.   digoxin 0.25 MG tablet Commonly known  as:  LANOXIN Take 0.25 mg by mouth daily.   ELIQUIS 5 MG Tabs tablet Generic drug:  apixaban Take 5 mg by mouth every 12 (twelve) hours.   feeding supplement (GLUCERNA SHAKE) Liqd Follow manufacturer instructions   fluconazole 200 MG tablet Commonly known  as:  DIFLUCAN Take 1 tablet (200 mg total) daily by mouth. X 7 days Start taking on:  03/18/2017   FREESTYLE LIBRE READER Devi Use 1 Units as directed. Check CBG's four times daily. Dx: E11.9   FREESTYLE LIBRE SENSOR SYSTEM Misc Use 1 Units as directed. Check CBG's four times daily. Dx: E11.9   HYDROcodone-acetaminophen 5-325 MG tablet Commonly known as:  NORCO/VICODIN Take 1 tablet by mouth every 6 (six) hours as needed for moderate pain.   insulin aspart 100 UNIT/ML FlexPen Commonly known as:  NOVOLOG Inject 3 Units into the skin 3 (three) times daily with meals.   Lancets 30G Misc Use 1 Units as directed. Check CBG's up to twice daily. Dx: E11.9   LANTUS SOLOSTAR 100 UNIT/ML Solostar Pen Generic drug:  Insulin Glargine Inject 10 Units at bedtime into the skin.   metoprolol tartrate 25 MG tablet Commonly known as:  LOPRESSOR Take 25 mg by mouth 2 (two) times daily.   ondansetron 8 MG tablet Commonly known as:  ZOFRAN 1 pill every 8 hours for nausea/vomitting as needed   pravastatin 20 MG tablet Commonly known as:  PRAVACHOL take 20mg  by mouth daily at bedtime   prochlorperazine 10 MG tablet Commonly known as:  COMPAZINE Take 1 tablet (10 mg total) by mouth every 6 (six) hours as needed for nausea or vomiting.        DISCHARGE INSTRUCTIONS:   1. PCP follow-up in one cervix 2. Oncology follow-up in 1 week 3. Urology follow-up in 2-3 weeks  DIET:   Cardiac diet  ACTIVITY:   Activity as tolerated  OXYGEN:   Home Oxygen: No.  Oxygen Delivery: room air  DISCHARGE LOCATION:   home   If you experience worsening of your admission symptoms, develop shortness of breath, life threatening emergency, suicidal or homicidal thoughts you must seek medical attention immediately by calling 911 or calling your MD immediately  if symptoms less severe.  You Must read complete instructions/literature along with all the possible adverse reactions/side effects for all  the Medicines you take and that have been prescribed to you. Take any new Medicines after you have completely understood and accpet all the possible adverse reactions/side effects.   Please note  You were cared for by a hospitalist during your hospital stay. If you have any questions about your discharge medications or the care you received while you were in the hospital after you are discharged, you can call the unit and asked to speak with the hospitalist on call if the hospitalist that took care of you is not available. Once you are discharged, your primary care physician will handle any further medical issues. Please note that NO REFILLS for any discharge medications will be authorized once you are discharged, as it is imperative that you return to your primary care physician (or establish a relationship with a primary care physician if you do not have one) for your aftercare needs so that they can reassess your need for medications and monitor your lab values.    On the day of Discharge:  VITAL SIGNS:   Blood pressure 119/62, pulse 83, temperature 98.5 F (36.9 C), temperature source Oral, resp. rate 18, height 5\' 10"  (1.778 m),  weight 113.4 kg (250 lb), SpO2 100 %.  PHYSICAL EXAMINATION:   GENERAL:  66 y.o.-year-old patient lying in the bed with no acute distress.  EYES: Pupils equal, round, reactive to light and accommodation. No scleral icterus. Extraocular muscles intact.  HEENT: Head atraumatic, normocephalic. Oropharynx and nasopharynx clear.  NECK:  Supple, no jugular venous distention. No thyroid enlargement, no tenderness.  LUNGS: Normal breath sounds bilaterally, no wheezing, rales,rhonchi or crepitation. No use of accessory muscles of respiration.  CARDIOVASCULAR: S1, S2 normal. No murmurs, rubs, or gallops.  ABDOMEN: Soft, nontender, nondistended. Bowel sounds present. No organomegaly or mass. Has left lower quadrant colostomy bag with stool Bilateral nephrostomy tubes with  clear draining urine, more urine in the right bag than the left -No hematoma noted that they exchange site EXTREMITIES: No pedal edema, cyanosis, or clubbing.  NEUROLOGIC: Cranial nerves II through XII are intact. Muscle strength 5/5 in all extremities. Sensation intact. Gait not checked.  PSYCHIATRIC: The patient is alert and oriented x 3.  SKIN: No obvious rash, lesion, or ulcer.     DATA REVIEW:   CBC Recent Labs  Lab 03/17/17 0406  WBC 7.1  HGB 10.4*  HCT 30.0*  PLT 242    Chemistries  Recent Labs  Lab 03/14/17 1137  03/17/17 0406  NA 131*   < > 136  K 4.1   < > 4.2  CL 97*   < > 104  CO2 21*   < > 25  GLUCOSE 215*   < > 208*  BUN 26*   < > 24*  CREATININE 1.75*   < > 1.58*  CALCIUM 9.3   < > 8.7*  AST 24  --   --   ALT 15*  --   --   ALKPHOS 71  --   --   BILITOT 1.0  --   --    < > = values in this interval not displayed.     Microbiology Results  Results for orders placed or performed during the hospital encounter of 03/14/17  Urine culture     Status: Abnormal (Preliminary result)   Collection Time: 03/14/17 11:37 AM  Result Value Ref Range Status   Specimen Description URINE, RANDOM  Final   Special Requests Normal  Final   Culture (A)  Final    >=100,000 COLONIES/mL ENTEROBACTER AEROGENES 60,000 COLONIES/mL ENTEROBACTER CLOACAE REPEATING SENSITIVITIES Performed at [facility], [place] [address] [place], [place] [address]    Report Status PENDING  Incomplete   Organism ID, Bacteria ENTEROBACTER AEROGENES (A)  Final      Susceptibility   Enterobacter aerogenes - MIC*    CEFAZOLIN >=64 RESISTANT Resistant     CEFTRIAXONE <=1 SENSITIVE Sensitive     CIPROFLOXACIN <=0.25 SENSITIVE Sensitive     GENTAMICIN <=1 SENSITIVE Sensitive     IMIPENEM 0.5 SENSITIVE Sensitive     NITROFURANTOIN 128 RESISTANT Resistant     TRIMETH/SULFA <=20 SENSITIVE Sensitive     PIP/TAZO <=4 SENSITIVE Sensitive     * >=100,000 COLONIES/mL ENTEROBACTER AEROGENES    Blood culture (routine x 2)     Status: None (Preliminary result)   Collection Time: 03/14/17  5:37 PM  Result Value Ref Range Status   Specimen Description BLOOD BLOOD RIGHT HAND  Final   Special Requests   Final    BOTTLES DRAWN AEROBIC AND ANAEROBIC Blood Culture results may not be optimal due to an inadequate volume of blood received in culture bottles  Culture NO GROWTH 3 DAYS  Final   Report Status PENDING  Incomplete  Blood culture (routine x 2)     Status: None (Preliminary result)   Collection Time: 03/14/17  6:47 PM  Result Value Ref Range Status   Specimen Description BLOOD BLOOD RIGHT HAND  Final   Special Requests   Final    BOTTLES DRAWN AEROBIC AND ANAEROBIC Blood Culture results may not be optimal due to an inadequate volume of blood received in culture bottles   Culture NO GROWTH 3 DAYS  Final   Report Status PENDING  Incomplete  Gastrointestinal Panel by PCR , Stool     Status: None   Collection Time: 03/16/17  2:16 PM  Result Value Ref Range Status   Campylobacter species NOT DETECTED NOT DETECTED Final   Plesimonas shigelloides NOT DETECTED NOT DETECTED Final   Salmonella species NOT DETECTED NOT DETECTED Final   Yersinia enterocolitica NOT DETECTED NOT DETECTED Final   Vibrio species NOT DETECTED NOT DETECTED Final   Vibrio cholerae NOT DETECTED NOT DETECTED Final   Enteroaggregative E coli (EAEC) NOT DETECTED NOT DETECTED Final   Enteropathogenic E coli (EPEC) NOT DETECTED NOT DETECTED Final   Enterotoxigenic E coli (ETEC) NOT DETECTED NOT DETECTED Final   Shiga like toxin producing E coli (STEC) NOT DETECTED NOT DETECTED Final   Shigella/Enteroinvasive E coli (EIEC) NOT DETECTED NOT DETECTED Final   Cryptosporidium NOT DETECTED NOT DETECTED Final   Cyclospora cayetanensis NOT DETECTED NOT DETECTED Final   Entamoeba histolytica NOT DETECTED NOT DETECTED Final   Giardia lamblia NOT DETECTED NOT DETECTED Final   Adenovirus F40/41 NOT DETECTED NOT DETECTED  Final   Astrovirus NOT DETECTED NOT DETECTED Final   Norovirus GI/GII NOT DETECTED NOT DETECTED Final   Rotavirus A NOT DETECTED NOT DETECTED Final   Sapovirus (I, II, IV, and V) NOT DETECTED NOT DETECTED Final    Comment: Performed at [facility], [address], [place], [place] [address]    RADIOLOGY:  No results found.   Management plans discussed with the patient, family and they are in agreement.  CODE STATUS:     Code Status Orders  (From admission, onward)        Start     Ordered   03/14/17 1713  Full code  Continuous     03/14/17 1712    Code Status History    Date Active Date Inactive Code Status Order ID Comments User Context   01/31/2017 19:54 02/03/2017 16:36 Full Code [id number]  [name], MD Inpatient   01/18/2017 18:40 01/21/2017 17:08 Full Code [id number]  Nicholes Mango, MD Inpatient   05/08/2016 18:37 05/09/2016 19:34 Full Code [id number]  Schnier, Dolores Lory, MD Inpatient    Advance Directive Documentation     Most Recent Value  Type of Advance Directive  Healthcare Power of Attorney, Living will  Pre-existing out of facility DNR order (yellow form or pink MOST form)  No data  "MOST" Form in Place?  No data      TOTAL TIME TAKING CARE OF THIS PATIENT: 38 minutes.    Gladstone Lighter M.D on 03/17/2017 at 12:02 PM  Between 7am to 6pm - Pager - [phone]  After 6pm go to [email or web address] - Proofreader  [facility]  Office  [phone]  CC: Primary care physician; Dion Body, MD   Note: This dictation was prepared with Dragon dictation along with smaller phrase technology. Any transcriptional errors that result from this process are unintentional.

## 2017-03-17 NOTE — Plan of Care (Signed)
BP elevated during shift, received scheduled metoprolol, WDL @ AM VS check.  Reported bilateral flank pain 7-8/10, improved w/ PRN PO Norco 5-325mg  x2 during shift.  No other complaints overnight.  Bilat neph dsgs changed.  Bed in low position, call bell within reach.  WCTM.

## 2017-03-17 NOTE — Progress Notes (Signed)
Pt being discharged home, discharge instructions and prescriptions reviewed with pt and son, states understanding, pt with no complaints, no distress or discomfort noted

## 2017-03-18 ENCOUNTER — Inpatient Hospital Stay: Payer: Medicare Other | Attending: Internal Medicine | Admitting: Internal Medicine

## 2017-03-18 ENCOUNTER — Ambulatory Visit: Payer: Medicare Other

## 2017-03-18 ENCOUNTER — Inpatient Hospital Stay: Payer: Medicare Other

## 2017-03-18 DIAGNOSIS — N2889 Other specified disorders of kidney and ureter: Secondary | ICD-10-CM | POA: Insufficient documentation

## 2017-03-18 DIAGNOSIS — M549 Dorsalgia, unspecified: Secondary | ICD-10-CM | POA: Insufficient documentation

## 2017-03-18 DIAGNOSIS — Z923 Personal history of irradiation: Secondary | ICD-10-CM | POA: Insufficient documentation

## 2017-03-18 DIAGNOSIS — Z8042 Family history of malignant neoplasm of prostate: Secondary | ICD-10-CM | POA: Insufficient documentation

## 2017-03-18 DIAGNOSIS — N189 Chronic kidney disease, unspecified: Secondary | ICD-10-CM | POA: Insufficient documentation

## 2017-03-18 DIAGNOSIS — C778 Secondary and unspecified malignant neoplasm of lymph nodes of multiple regions: Secondary | ICD-10-CM | POA: Insufficient documentation

## 2017-03-18 DIAGNOSIS — I4891 Unspecified atrial fibrillation: Secondary | ICD-10-CM | POA: Insufficient documentation

## 2017-03-18 DIAGNOSIS — Z803 Family history of malignant neoplasm of breast: Secondary | ICD-10-CM | POA: Insufficient documentation

## 2017-03-18 DIAGNOSIS — I739 Peripheral vascular disease, unspecified: Secondary | ICD-10-CM | POA: Insufficient documentation

## 2017-03-18 DIAGNOSIS — Z79899 Other long term (current) drug therapy: Secondary | ICD-10-CM | POA: Insufficient documentation

## 2017-03-18 DIAGNOSIS — G620 Drug-induced polyneuropathy: Secondary | ICD-10-CM | POA: Insufficient documentation

## 2017-03-18 DIAGNOSIS — E119 Type 2 diabetes mellitus without complications: Secondary | ICD-10-CM | POA: Insufficient documentation

## 2017-03-18 DIAGNOSIS — I10 Essential (primary) hypertension: Secondary | ICD-10-CM | POA: Insufficient documentation

## 2017-03-18 DIAGNOSIS — R0602 Shortness of breath: Secondary | ICD-10-CM | POA: Insufficient documentation

## 2017-03-18 DIAGNOSIS — C20 Malignant neoplasm of rectum: Secondary | ICD-10-CM | POA: Insufficient documentation

## 2017-03-18 DIAGNOSIS — I82402 Acute embolism and thrombosis of unspecified deep veins of left lower extremity: Secondary | ICD-10-CM | POA: Insufficient documentation

## 2017-03-18 DIAGNOSIS — J449 Chronic obstructive pulmonary disease, unspecified: Secondary | ICD-10-CM | POA: Insufficient documentation

## 2017-03-18 DIAGNOSIS — Z794 Long term (current) use of insulin: Secondary | ICD-10-CM | POA: Insufficient documentation

## 2017-03-18 DIAGNOSIS — I509 Heart failure, unspecified: Secondary | ICD-10-CM | POA: Insufficient documentation

## 2017-03-18 DIAGNOSIS — Z5111 Encounter for antineoplastic chemotherapy: Secondary | ICD-10-CM | POA: Insufficient documentation

## 2017-03-18 DIAGNOSIS — I129 Hypertensive chronic kidney disease with stage 1 through stage 4 chronic kidney disease, or unspecified chronic kidney disease: Secondary | ICD-10-CM | POA: Insufficient documentation

## 2017-03-18 DIAGNOSIS — R319 Hematuria, unspecified: Secondary | ICD-10-CM | POA: Insufficient documentation

## 2017-03-18 LAB — URINE CULTURE: SPECIAL REQUESTS: NORMAL

## 2017-03-18 NOTE — Assessment & Plan Note (Deleted)
#   RECURRENT RECTAL CANCER STAGE IV- [s/p LN Bx]; currently on 5FU infusion cycle #1; tolerated well.  # will plan to proceed with FOLFIRI today. Labs today reviewed;  acceptable for treatment today.   # Hematuria-likely secondary to bladder involvement by the tumor. Recommend stopping asprin 81mg /day; continue eliquis-given the extensive DVT. Check UA/culture. Referral to Dr. Stefani Dama per family request.   # Acute renal insufficiency/CKD- secondary to bilateral hydronephrosis from the underlying pelvic mass. S/p PNC bil. Renal function improving/stabilizing with creatinine at 1.7  # Back pain- secondary to pelvic mass/PCN; continue hydrocodone as needed  # Acute DVT Left -continue Eliquis.   # DM-2 ; on insulin/ amaryl; off metformin. On Lantus. Discussed the patient will have elevated blood sugars around the time of chemo-secondary to steroids. Recommend checking blood sugars at least 3 times a day. If elevated- report to PCP  # patient's daughter had concerns regarding lapse in his care regarding follow-up of his CT scan that was done in December 2017- that initially noted to have possible recurrence. Voices Concerns that he was not referred to oncology sooner.   # follow up in 2 weeks/labs;FOLFIRI/labs/CEA; 1 week-cbc/bmp.UA/culture.   Cc; Dr.L

## 2017-03-18 NOTE — Progress Notes (Deleted)
[place] NOTE  Patient Care Team: Dion Body, MD as PCP - General (Family Medicine) Clent Jacks, RN as Registered Nurse  CHIEF COMPLAINTS/PURPOSE OF CONSULTATION: RECTAL CANCER  #  Oncology History   # 2012- RECTO-SIGMOID-poorly diff STAGE III CA [s/p neo-adj chemo-RT- poor response]; APR [residual ypT3; 5/8 LN positiveDr.Smith/Dr.Pandit]  S/p FOLFOX   # SEP 2019- Pre-sacral fluid/mass- increasing ~ 5-6 cm [increased from dec 2017; also NEW retroperitoneal lymph nodes; inguinal adenopathy]; SEP 2018- left supraclavicular; mediastinal; right hilar-retroperitoneal; pelvic mass/node; bilateral inguinal adenopathy; s/p right Ingiunal LNBx - RECURRENT CA [necrotic;insuff tissue for F-One]  # OCT 8th 2018-FOLFIRI [first cycle 5FU alone]; OCT 22nd FOLFIRI  # Acute renal insuff [s/p bil PCN]; hematuria- sec to bladder invol [? Contra-indication to avastin]  # LEFT LE DVT [? [facility]- Dr.Schneir]; Eliquis.   # CHF/COPD/CKD/ PN sec to [place]  # [facility] One-PDL-1/ TPS- 0% [2012-rectal specimen]; pending     Rectal cancer ([place])    HISTORY OF PRESENTING ILLNESS:  Donald Townsend 66 y.o.  male  above History of recurrent metastatic rectal cancer currently on 5-FU chemotherapy is here for follow-up.   Patient noted to have continued blood when he urinates. There is no blood in the nephrostomy tubes. He continues to notice decreased output from the left compared to the right. He has been evaluated by urology- this has been attributed to his rectal malignancy involving the bladder. Patient/daughter interested in a different urology opinion.   Patient denies any fevers or chills. Denies any diarrhea. Denies any sores in the mouth.  No nausea no vomiting. Chronic mild back pain.    Patient denies any pain. He has chronic mild swelling of the left lower extremity. Chronic tingling and numbness of his bilateral lower extremities-not any worse.   ROS: A  complete 10 point review of system is done which is negative except mentioned above in history of present illness.   MEDICAL HISTORY:  Past Medical History:  Diagnosis Date  . A-fib ([place])   . Cancer [facility])    Colon  . CHF (congestive heart failure) ([place])   . Diabetes mellitus without complication ([place])   . Dyspnea   . Elevated lipids   . Hypertension   . PVD (peripheral vascular disease) ([place])     SURGICAL HISTORY: Past Surgical History:  Procedure Laterality Date  . COLON RESECTION     with colostomy  . COLONOSCOPY    . CYSTOSCOPY WITH STENT PLACEMENT Bilateral 01/18/2017   Performed by [name], MD at [facility]  . IR FLUORO GUIDE PORT INSERTION RIGHT  01/24/2017  . IR NEPHROSTOMY EXCHANGE LEFT  03/14/2017  . IR NEPHROSTOMY EXCHANGE RIGHT  03/14/2017  . IR NEPHROSTOMY PLACEMENT LEFT  01/19/2017  . IR NEPHROSTOMY PLACEMENT RIGHT  01/19/2017  . IVC Filter Removal N/A 08/28/2016   Performed by Katha Cabal, MD at [facility]  . Lower Extremity Venography Left 12/18/2016   Performed by Katha Cabal, MD at [facility]  . Lower Extremity Venography Left 05/08/2016   Performed by Delana Meyer Dolores Lory, MD at [facility]    SOCIAL HISTORY: ; retd- Supervisor; currently works part time. He lives alone. No smoking occasional alcohol. Social History   Socioeconomic History  . Marital status: Divorced    Spouse name: Not on file  . Number of children: Not on file  . Years of education: Not on file  . Highest  education level: Not on file  Social Needs  . Financial resource strain: Not on file  . Food insecurity - worry: Not on file  . Food insecurity - inability: Not on file  . Transportation needs - medical: Not on file  . Transportation needs - non-medical: Not on file  Occupational History  . Not on file  Tobacco Use  . Smoking status: Never Smoker  . Smokeless tobacco: Never Used  Substance and Sexual Activity  . Alcohol  use: No  . Drug use: No  . Sexual activity: Not on file  Other Topics Concern  . Not on file  Social History Narrative  . Not on file    FAMILY HISTORY: Family History  Problem Relation Age of Onset  . Diabetes Mother   . Stroke Mother   . Prostate cancer Brother   . Cancer Sister        Breast cancer  . Kidney cancer Neg Hx   . Bladder Cancer Neg Hx     ALLERGIES:  has No Known Allergies.  MEDICATIONS:  Current Outpatient Medications  Medication Sig Dispense Refill  . acidophilus (RISAQUAD) CAPS capsule Take 1 capsule daily by mouth. 30 capsule 0  . B-D ULTRAFINE III SHORT PEN 31G X 8 MM MISC     . ciprofloxacin (CIPRO) 500 MG tablet Take 1 tablet (500 mg total) 2 (two) times daily for 10 days by mouth. 20 tablet 0  . Continuous Blood Gluc Receiver (FREESTYLE LIBRE READER) DEVI Use 1 Units as directed. Check CBG's four times daily. Dx: E11.9    . Continuous Blood Gluc Sensor (FREESTYLE LIBRE SENSOR SYSTEM) MISC Use 1 Units as directed. Check CBG's four times daily. Dx: E11.9    . cyclobenzaprine (FEXMID) 7.5 MG tablet Take 1 tablet (7.5 mg total) 3 (three) times daily as needed by mouth for muscle spasms. 30 tablet 0  . digoxin (LANOXIN) 0.25 MG tablet Take 0.25 mg by mouth daily.     [name] ELIQUIS 5 MG TABS tablet Take 5 mg by mouth every 12 (twelve) hours.  0  . feeding supplement, GLUCERNA SHAKE, ([place]) LIQD Follow manufacturer instructions    . fluconazole (DIFLUCAN) 200 MG tablet Take 1 tablet (200 mg total) daily by mouth. X 7 days 7 tablet 0  . HYDROcodone-acetaminophen (NORCO/VICODIN) 5-325 MG tablet Take 1 tablet by mouth every 6 (six) hours as needed for moderate pain. 90 tablet 0  . insulin aspart (NOVOLOG) 100 UNIT/ML FlexPen Inject 3 Units into the skin 3 (three) times daily with meals.     . Lancets 30G MISC Use 1 Units as directed. Check CBG's up to twice daily. Dx: E11.9    . LANTUS SOLOSTAR 100 UNIT/ML Solostar Pen Inject 10 Units at bedtime into the  skin.   0  . metoprolol tartrate (LOPRESSOR) 25 MG tablet Take 25 mg by mouth 2 (two) times daily.     . ondansetron (ZOFRAN) 8 MG tablet 1 pill every 8 hours for nausea/vomitting as needed 40 tablet 2  . pravastatin (PRAVACHOL) 20 MG tablet take 53m by mouth daily at bedtime    . prochlorperazine (COMPAZINE) 10 MG tablet Take 1 tablet (10 mg total) by mouth every 6 (six) hours as needed for nausea or vomiting. 40 tablet 1   No current facility-administered medications for this visit.       .[name] PHYSICAL EXAMINATION: ECOG PERFORMANCE STATUS: 0 - Asymptomatic  There were no vitals filed for this visit. There were no  vitals filed for this visit.  GENERAL: Well-nourished well-developed; Alert, no distress and comfortable.   With his daughter EYES: no pallor or icterus OROPHARYNX: no thrush or ulceration; good dentition  NECK: supple, no masses felt LYMPH:  no palpable lymphadenopathy in the cervical, axillary or inguinal regions LUNGS: clear to auscultation and  No wheeze or crackles HEART/CVS: regular rate & rhythm and no murmurs; No lower extremity edema ABDOMEN: abdomen soft, non-tender and normal bowel sounds; colostomy in place. Bilateral nephrostomy tubes in place. Musculoskeletal:no cyanosis of digits and no clubbing  PSYCH: alert & oriented x 3 with fluent speech NEURO: no focal motor/sensory deficits SKIN:  no rashes or significant lesions  LABORATORY DATA:  I have reviewed the data as listed Lab Results  Component Value Date   WBC 7.1 03/17/2017   HGB 10.4 (L) 03/17/2017   HCT 30.0 (L) 03/17/2017   MCV 85.3 03/17/2017   PLT 242 03/17/2017   Recent Labs    02/18/17 0802 02/25/17 1034 03/14/17 1137 03/15/17 0408 03/16/17 0437 03/17/17 0406  NA 136 133* 131* 134* 136 136  K 3.7 4.0 4.1 4.0 4.0 4.2  CL 103 100* 97* 103 103 104  CO2 25 22 21* '23 25 25  ' GLUCOSE 212* 221* 215* 191* 219* 208*  BUN 20 22* 26* 22* 20 24*  CREATININE 1.55* 1.61* 1.75* 1.74* 1.58*  1.58*  CALCIUM 9.4 9.0 9.3 8.3* 8.4* 8.7*  GFRNONAA 45* 43* 39* 39* 44* 44*  GFRAA 52* 50* 45* 45* 51* 51*  PROT 7.4 7.0 7.4  --   --   --   ALBUMIN 3.4* 3.4* 3.4*  --   --   --   AST '27 26 24  ' --   --   --   ALT 20 17 15*  --   --   --   ALKPHOS 80 75 71  --   --   --   BILITOT 1.0 1.1 1.0  --   --   --     RADIOGRAPHIC STUDIES: I have personally reviewed the radiological images as listed and agreed with the findings in the report. Dg Chest 2 View  Result Date: 03/14/2017 CLINICAL DATA:  Fever. History of colon cancer. Currently on chemoradiation. EXAM: CHEST  2 VIEW COMPARISON:  Chest x-ray dated January 31, 2017. FINDINGS: Right chest wall port catheter with tip at the cavoatrial junction, unchanged. The cardiomediastinal silhouette is normal in size. Normal pulmonary vascularity. No focal consolidation, pleural effusion, or pneumothorax. No acute osseous abnormality. IMPRESSION: No active cardiopulmonary disease. Electronically Signed   By: Titus Dubin M.D.   On: 03/14/2017 12:14   US Renal  Result Date: 03/14/2017 CLINICAL DATA:  Fever chills and nephrostomy tube EXAM: RENAL / URINARY TRACT ULTRASOUND COMPLETE COMPARISON:  PET-CT 01/22/2017 FINDINGS: Right Kidney: Length: 11.9 cm. Cortical echogenicity within normal limits. Partially visualize right nephrostomy tube without significant hydronephrosis or perinephric collection. Left Kidney: Length: 10.3 cm. Cortical echogenicity within normal limits. Partially visualized nephrostomy tube in the lower pole. No perinephric collection Bladder: Poorly defined with probable wall thickening. IMPRESSION: 1. Bilateral partially visualized nephrostomy tubes. No significant hydronephrosis. No perinephric fluid collections by sonography 2. Poorly visible urinary bladder. Electronically Signed   By: Donavan Foil M.D.   On: 03/14/2017 15:16   Ir Nephrostomy Exchange Left  Result Date: 03/14/2017 INDICATION: History of rectal cancer with  bilateral obstructive uropathy, post bilateral ultrasound fluoroscopic guided nephrostomy catheter placement on 01/19/2017 by Dr. Kathlene Cote. Patient now presents difference  department with concern for impending urosepsis neck soft, request made for fluoroscopic guided exchange of bilateral percutaneous nephrostomy catheters. EXAM: FLUOROSCOPIC GUIDED BILATERAL SIDED NEPHROSTOMY CATHETER EXCHANGE COMPARISON:  None. CONTRAST:  A total of 20 mL Isovue-300 administered was administered into both collecting systems FLUOROSCOPY TIME:  3 minutes 30 seconds (003 mGy) COMPLICATIONS: None immediate. TECHNIQUE: Informed written consent was obtained from the patient after a discussion of the risks, benefits and alternatives to treatment. Questions regarding the procedure were encouraged and answered. A timeout was performed prior to the initiation of the procedure. The bilateral flanks and external portions of existing nephrostomy catheters were prepped and draped in the usual sterile fashion. A sterile drape was applied covering the operative field. Maximum barrier sterile technique with sterile gowns and gloves were used for the procedure. A timeout was performed prior to the initiation of the procedure. A pre procedural spot fluoroscopic image was obtained. Beginning with the left-sided nephrostomy, a small amount of contrast was injected via the existing left-sided nephrostomy catheter demonstrating appropriate positioning within the renal pelvis. The existing nephrostomy catheter was cut and cannulated with a Benson wire which was coiled within the renal pelvis. Under intermittent fluoroscopic guidance, the existing nephrostomy catheter was exchanged for a new 10.2 [place] all-purpose drainage catheter. Limited contrast injection confirmed appropriate positioning within the left renal pelvis and a post exchange fluoroscopic image was obtained. The catheter was locked, secured to the skin with an interrupted suture and  reconnected to a gravity bag. The identical repeat procedure was repeated for the contralateral right-sided nephrostomy, ultimately allowing successful exchange of a new 10.2 [place] all-purpose drainage catheter with end coiled and locked within the right renal pelvis. Dressings were placed. The patient tolerated the above procedures well without immediate postprocedural complication. FINDINGS: The existing nephrostomy catheters are appropriately positioned and functioning. After successful fluoroscopic guided exchange, new bilateral 10.2 French nephrostomy catheters are coiled and locked within the respective renal pelvises. IMPRESSION: Successful fluoroscopic guided exchange of bilateral 10.2 French percutaneous nephrostomy catheters. Electronically Signed   By: Sandi Mariscal M.D.   On: 03/14/2017 16:59   Ir Nephrostomy Exchange Right  Result Date: 03/14/2017 INDICATION: History of rectal cancer with bilateral obstructive uropathy, post bilateral ultrasound fluoroscopic guided nephrostomy catheter placement on 01/19/2017 by Dr. Kathlene Cote. Patient now presents difference department with concern for impending urosepsis neck soft, request made for fluoroscopic guided exchange of bilateral percutaneous nephrostomy catheters. EXAM: FLUOROSCOPIC GUIDED BILATERAL SIDED NEPHROSTOMY CATHETER EXCHANGE COMPARISON:  None. CONTRAST:  A total of 20 mL Isovue-300 administered was administered into both collecting systems FLUOROSCOPY TIME:  3 minutes 30 seconds (704 mGy) COMPLICATIONS: None immediate. TECHNIQUE: Informed written consent was obtained from the patient after a discussion of the risks, benefits and alternatives to treatment. Questions regarding the procedure were encouraged and answered. A timeout was performed prior to the initiation of the procedure. The bilateral flanks and external portions of existing nephrostomy catheters were prepped and draped in the usual sterile fashion. A sterile drape was applied  covering the operative field. Maximum barrier sterile technique with sterile gowns and gloves were used for the procedure. A timeout was performed prior to the initiation of the procedure. A pre procedural spot fluoroscopic image was obtained. Beginning with the left-sided nephrostomy, a small amount of contrast was injected via the existing left-sided nephrostomy catheter demonstrating appropriate positioning within the renal pelvis. The existing nephrostomy catheter was cut and cannulated with a Benson wire which was coiled within the renal pelvis. Under intermittent  fluoroscopic guidance, the existing nephrostomy catheter was exchanged for a new 10.2 [place] all-purpose drainage catheter. Limited contrast injection confirmed appropriate positioning within the left renal pelvis and a post exchange fluoroscopic image was obtained. The catheter was locked, secured to the skin with an interrupted suture and reconnected to a gravity bag. The identical repeat procedure was repeated for the contralateral right-sided nephrostomy, ultimately allowing successful exchange of a new 10.2 [place] all-purpose drainage catheter with end coiled and locked within the right renal pelvis. Dressings were placed. The patient tolerated the above procedures well without immediate postprocedural complication. FINDINGS: The existing nephrostomy catheters are appropriately positioned and functioning. After successful fluoroscopic guided exchange, new bilateral 10.2 French nephrostomy catheters are coiled and locked within the respective renal pelvises. IMPRESSION: Successful fluoroscopic guided exchange of bilateral 10.2 French percutaneous nephrostomy catheters. Electronically Signed   By: Sandi Mariscal M.D.   On: 03/14/2017 16:59    ASSESSMENT & PLAN:   No problem-specific Assessment & Plan notes found for this encounter.  All questions were answered. The patient knows to call the clinic with any problems, questions or concerns.     Cammie Sickle, MD 03/18/2017 8:47 AM

## 2017-03-19 ENCOUNTER — Encounter: Payer: Self-pay | Admitting: Internal Medicine

## 2017-03-19 ENCOUNTER — Ambulatory Visit
Admission: RE | Admit: 2017-03-19 | Discharge: 2017-03-19 | Disposition: A | Discharge status: Home / Self Care | Payer: Medicare Other | Source: Ambulatory Visit | Attending: Radiation Oncology | Admitting: Radiation Oncology

## 2017-03-19 ENCOUNTER — Ambulatory Visit: Payer: Medicare Other

## 2017-03-19 ENCOUNTER — Telehealth: Payer: Self-pay | Admitting: Internal Medicine

## 2017-03-19 ENCOUNTER — Encounter: Payer: Self-pay | Admitting: Urology

## 2017-03-19 DIAGNOSIS — R918 Other nonspecific abnormal finding of lung field: Secondary | ICD-10-CM | POA: Diagnosis not present

## 2017-03-19 DIAGNOSIS — R319 Hematuria, unspecified: Secondary | ICD-10-CM | POA: Diagnosis not present

## 2017-03-19 DIAGNOSIS — N189 Chronic kidney disease, unspecified: Secondary | ICD-10-CM | POA: Diagnosis not present

## 2017-03-19 DIAGNOSIS — Z510 Encounter for antineoplastic radiation therapy: Secondary | ICD-10-CM | POA: Diagnosis not present

## 2017-03-19 DIAGNOSIS — Z809 Family history of malignant neoplasm, unspecified: Secondary | ICD-10-CM | POA: Diagnosis not present

## 2017-03-19 DIAGNOSIS — Z794 Long term (current) use of insulin: Secondary | ICD-10-CM | POA: Diagnosis not present

## 2017-03-19 DIAGNOSIS — Z79899 Other long term (current) drug therapy: Secondary | ICD-10-CM | POA: Diagnosis not present

## 2017-03-19 DIAGNOSIS — Z9221 Personal history of antineoplastic chemotherapy: Secondary | ICD-10-CM | POA: Diagnosis not present

## 2017-03-19 DIAGNOSIS — I4891 Unspecified atrial fibrillation: Secondary | ICD-10-CM | POA: Diagnosis not present

## 2017-03-19 DIAGNOSIS — R0609 Other forms of dyspnea: Secondary | ICD-10-CM | POA: Diagnosis not present

## 2017-03-19 DIAGNOSIS — C20 Malignant neoplasm of rectum: Secondary | ICD-10-CM | POA: Diagnosis not present

## 2017-03-19 DIAGNOSIS — Z933 Colostomy status: Secondary | ICD-10-CM | POA: Diagnosis not present

## 2017-03-19 DIAGNOSIS — Z7901 Long term (current) use of anticoagulants: Secondary | ICD-10-CM | POA: Diagnosis not present

## 2017-03-19 DIAGNOSIS — I739 Peripheral vascular disease, unspecified: Secondary | ICD-10-CM | POA: Diagnosis not present

## 2017-03-19 DIAGNOSIS — E785 Hyperlipidemia, unspecified: Secondary | ICD-10-CM | POA: Diagnosis not present

## 2017-03-19 DIAGNOSIS — C7911 Secondary malignant neoplasm of bladder: Secondary | ICD-10-CM | POA: Diagnosis not present

## 2017-03-19 DIAGNOSIS — Z86718 Personal history of other venous thrombosis and embolism: Secondary | ICD-10-CM | POA: Diagnosis not present

## 2017-03-19 DIAGNOSIS — I509 Heart failure, unspecified: Secondary | ICD-10-CM | POA: Diagnosis not present

## 2017-03-19 DIAGNOSIS — E119 Type 2 diabetes mellitus without complications: Secondary | ICD-10-CM | POA: Diagnosis not present

## 2017-03-19 DIAGNOSIS — I129 Hypertensive chronic kidney disease with stage 1 through stage 4 chronic kidney disease, or unspecified chronic kidney disease: Secondary | ICD-10-CM | POA: Diagnosis not present

## 2017-03-19 DIAGNOSIS — Z923 Personal history of irradiation: Secondary | ICD-10-CM | POA: Diagnosis not present

## 2017-03-19 DIAGNOSIS — J449 Chronic obstructive pulmonary disease, unspecified: Secondary | ICD-10-CM | POA: Diagnosis not present

## 2017-03-19 DIAGNOSIS — Z8042 Family history of malignant neoplasm of prostate: Secondary | ICD-10-CM | POA: Diagnosis not present

## 2017-03-19 LAB — CULTURE, BLOOD (ROUTINE X 2)
CULTURE: NO GROWTH
Culture: NO GROWTH

## 2017-03-19 NOTE — Telephone Encounter (Signed)
Spoke to patient's daughter at length-last urine culture-showed Enterobacter.  Patient is currently on ciprofloxacin-"pt not bounced back yet"  Patient's daughter will send [place] an email end of the week-might want to get UA/culture-on 11/26.  Patient is scheduled for chemotherapy for 11/28.  Spoke to patient's daughter patient is unfortunately at risk for infections given his multiple comorbidities/ongoing chemotherapy.   GB

## 2017-03-20 ENCOUNTER — Ambulatory Visit
Admission: RE | Admit: 2017-03-20 | Discharge: 2017-03-20 | Disposition: A | Discharge status: Home / Self Care | Payer: Medicare Other | Source: Ambulatory Visit | Attending: Radiation Oncology | Admitting: Radiation Oncology

## 2017-03-20 ENCOUNTER — Ambulatory Visit: Payer: Medicare Other

## 2017-03-20 DIAGNOSIS — R319 Hematuria, unspecified: Secondary | ICD-10-CM | POA: Diagnosis not present

## 2017-03-20 DIAGNOSIS — C7911 Secondary malignant neoplasm of bladder: Secondary | ICD-10-CM | POA: Diagnosis not present

## 2017-03-20 DIAGNOSIS — Z510 Encounter for antineoplastic radiation therapy: Secondary | ICD-10-CM | POA: Diagnosis not present

## 2017-03-20 DIAGNOSIS — C20 Malignant neoplasm of rectum: Secondary | ICD-10-CM | POA: Diagnosis not present

## 2017-03-20 DIAGNOSIS — R918 Other nonspecific abnormal finding of lung field: Secondary | ICD-10-CM | POA: Diagnosis not present

## 2017-03-20 DIAGNOSIS — I509 Heart failure, unspecified: Secondary | ICD-10-CM | POA: Diagnosis not present

## 2017-03-25 ENCOUNTER — Ambulatory Visit
Admission: RE | Admit: 2017-03-25 | Discharge: 2017-03-25 | Disposition: A | Discharge status: Home / Self Care | Payer: Medicare Other | Source: Ambulatory Visit | Attending: Radiation Oncology | Admitting: Radiation Oncology

## 2017-03-25 ENCOUNTER — Ambulatory Visit: Payer: Medicare Other

## 2017-03-25 DIAGNOSIS — I509 Heart failure, unspecified: Secondary | ICD-10-CM | POA: Diagnosis not present

## 2017-03-25 DIAGNOSIS — Z510 Encounter for antineoplastic radiation therapy: Secondary | ICD-10-CM | POA: Diagnosis not present

## 2017-03-25 DIAGNOSIS — C20 Malignant neoplasm of rectum: Secondary | ICD-10-CM | POA: Diagnosis not present

## 2017-03-25 DIAGNOSIS — C7911 Secondary malignant neoplasm of bladder: Secondary | ICD-10-CM | POA: Diagnosis not present

## 2017-03-25 DIAGNOSIS — R918 Other nonspecific abnormal finding of lung field: Secondary | ICD-10-CM | POA: Diagnosis not present

## 2017-03-25 DIAGNOSIS — R319 Hematuria, unspecified: Secondary | ICD-10-CM | POA: Diagnosis not present

## 2017-03-26 ENCOUNTER — Ambulatory Visit: Payer: Medicare Other

## 2017-03-26 ENCOUNTER — Ambulatory Visit: Payer: Medicare Other | Admitting: Internal Medicine

## 2017-03-26 ENCOUNTER — Other Ambulatory Visit: Payer: Medicare Other

## 2017-03-26 ENCOUNTER — Ambulatory Visit
Admission: RE | Admit: 2017-03-26 | Discharge: 2017-03-26 | Disposition: A | Discharge status: Home / Self Care | Payer: Medicare Other | Source: Ambulatory Visit | Attending: Radiation Oncology | Admitting: Radiation Oncology

## 2017-03-26 DIAGNOSIS — E118 Type 2 diabetes mellitus with unspecified complications: Secondary | ICD-10-CM | POA: Diagnosis not present

## 2017-03-26 DIAGNOSIS — C20 Malignant neoplasm of rectum: Secondary | ICD-10-CM | POA: Diagnosis not present

## 2017-03-26 DIAGNOSIS — I509 Heart failure, unspecified: Secondary | ICD-10-CM | POA: Diagnosis not present

## 2017-03-26 DIAGNOSIS — Z510 Encounter for antineoplastic radiation therapy: Secondary | ICD-10-CM | POA: Diagnosis not present

## 2017-03-26 DIAGNOSIS — R319 Hematuria, unspecified: Secondary | ICD-10-CM | POA: Diagnosis not present

## 2017-03-26 DIAGNOSIS — Z794 Long term (current) use of insulin: Secondary | ICD-10-CM | POA: Diagnosis not present

## 2017-03-26 DIAGNOSIS — C7911 Secondary malignant neoplasm of bladder: Secondary | ICD-10-CM | POA: Diagnosis not present

## 2017-03-26 DIAGNOSIS — N390 Urinary tract infection, site not specified: Secondary | ICD-10-CM | POA: Diagnosis not present

## 2017-03-26 DIAGNOSIS — R918 Other nonspecific abnormal finding of lung field: Secondary | ICD-10-CM | POA: Diagnosis not present

## 2017-03-26 DIAGNOSIS — Z8619 Personal history of other infectious and parasitic diseases: Secondary | ICD-10-CM | POA: Diagnosis not present

## 2017-03-27 ENCOUNTER — Inpatient Hospital Stay: Payer: Medicare Other

## 2017-03-27 ENCOUNTER — Telehealth: Payer: Self-pay | Admitting: Pharmacist

## 2017-03-27 ENCOUNTER — Inpatient Hospital Stay (HOSPITAL_BASED_OUTPATIENT_CLINIC_OR_DEPARTMENT_OTHER): Payer: Medicare Other | Admitting: Internal Medicine

## 2017-03-27 ENCOUNTER — Other Ambulatory Visit: Payer: Self-pay

## 2017-03-27 ENCOUNTER — Ambulatory Visit: Payer: Medicare Other

## 2017-03-27 ENCOUNTER — Encounter: Payer: Self-pay | Admitting: Internal Medicine

## 2017-03-27 ENCOUNTER — Ambulatory Visit
Admission: RE | Admit: 2017-03-27 | Discharge: 2017-03-27 | Disposition: A | Discharge status: Home / Self Care | Payer: Medicare Other | Source: Ambulatory Visit | Attending: Radiation Oncology | Admitting: Radiation Oncology

## 2017-03-27 VITALS — BP 141/80 | HR 80 | Temp 99.1°F | Resp 20 | Ht 70.0 in | Wt 248.8 lb

## 2017-03-27 DIAGNOSIS — Z5111 Encounter for antineoplastic chemotherapy: Secondary | ICD-10-CM | POA: Diagnosis not present

## 2017-03-27 DIAGNOSIS — E119 Type 2 diabetes mellitus without complications: Secondary | ICD-10-CM

## 2017-03-27 DIAGNOSIS — Z803 Family history of malignant neoplasm of breast: Secondary | ICD-10-CM | POA: Diagnosis not present

## 2017-03-27 DIAGNOSIS — I509 Heart failure, unspecified: Secondary | ICD-10-CM | POA: Diagnosis not present

## 2017-03-27 DIAGNOSIS — I129 Hypertensive chronic kidney disease with stage 1 through stage 4 chronic kidney disease, or unspecified chronic kidney disease: Secondary | ICD-10-CM

## 2017-03-27 DIAGNOSIS — I82402 Acute embolism and thrombosis of unspecified deep veins of left lower extremity: Secondary | ICD-10-CM | POA: Diagnosis not present

## 2017-03-27 DIAGNOSIS — R319 Hematuria, unspecified: Secondary | ICD-10-CM

## 2017-03-27 DIAGNOSIS — B189 Chronic viral hepatitis, unspecified: Secondary | ICD-10-CM

## 2017-03-27 DIAGNOSIS — J449 Chronic obstructive pulmonary disease, unspecified: Secondary | ICD-10-CM

## 2017-03-27 DIAGNOSIS — C778 Secondary and unspecified malignant neoplasm of lymph nodes of multiple regions: Secondary | ICD-10-CM | POA: Diagnosis not present

## 2017-03-27 DIAGNOSIS — Z794 Long term (current) use of insulin: Secondary | ICD-10-CM | POA: Diagnosis not present

## 2017-03-27 DIAGNOSIS — I739 Peripheral vascular disease, unspecified: Secondary | ICD-10-CM

## 2017-03-27 DIAGNOSIS — Z923 Personal history of irradiation: Secondary | ICD-10-CM

## 2017-03-27 DIAGNOSIS — C20 Malignant neoplasm of rectum: Secondary | ICD-10-CM

## 2017-03-27 DIAGNOSIS — R0602 Shortness of breath: Secondary | ICD-10-CM

## 2017-03-27 DIAGNOSIS — N2889 Other specified disorders of kidney and ureter: Secondary | ICD-10-CM | POA: Diagnosis not present

## 2017-03-27 DIAGNOSIS — M549 Dorsalgia, unspecified: Secondary | ICD-10-CM | POA: Diagnosis not present

## 2017-03-27 DIAGNOSIS — G620 Drug-induced polyneuropathy: Secondary | ICD-10-CM | POA: Diagnosis not present

## 2017-03-27 DIAGNOSIS — R918 Other nonspecific abnormal finding of lung field: Secondary | ICD-10-CM | POA: Diagnosis not present

## 2017-03-27 DIAGNOSIS — I10 Essential (primary) hypertension: Secondary | ICD-10-CM | POA: Diagnosis not present

## 2017-03-27 DIAGNOSIS — Z8042 Family history of malignant neoplasm of prostate: Secondary | ICD-10-CM | POA: Diagnosis not present

## 2017-03-27 DIAGNOSIS — Z510 Encounter for antineoplastic radiation therapy: Secondary | ICD-10-CM | POA: Diagnosis not present

## 2017-03-27 DIAGNOSIS — I4891 Unspecified atrial fibrillation: Secondary | ICD-10-CM | POA: Diagnosis not present

## 2017-03-27 DIAGNOSIS — N189 Chronic kidney disease, unspecified: Secondary | ICD-10-CM | POA: Diagnosis not present

## 2017-03-27 DIAGNOSIS — C7911 Secondary malignant neoplasm of bladder: Secondary | ICD-10-CM | POA: Diagnosis not present

## 2017-03-27 DIAGNOSIS — Z79899 Other long term (current) drug therapy: Secondary | ICD-10-CM

## 2017-03-27 LAB — CBC WITH DIFFERENTIAL/PLATELET
Basophils Absolute: 0.1 10*3/uL (ref 0–0.1)
Basophils Relative: 1 %
EOS PCT: 8 %
Eosinophils Absolute: 0.8 10*3/uL — ABNORMAL HIGH (ref 0–0.7)
HCT: 33.1 % — ABNORMAL LOW (ref 40.0–52.0)
Hemoglobin: 11.3 g/dL — ABNORMAL LOW (ref 13.0–18.0)
LYMPHS ABS: 0.8 10*3/uL — AB (ref 1.0–3.6)
LYMPHS PCT: 8 %
MCH: 29.5 pg (ref 26.0–34.0)
MCHC: 34.2 g/dL (ref 32.0–36.0)
MCV: 86.1 fL (ref 80.0–100.0)
Monocytes Absolute: 0.7 10*3/uL (ref 0.2–1.0)
Monocytes Relative: 7 %
Neutro Abs: 7.7 10*3/uL — ABNORMAL HIGH (ref 1.4–6.5)
Neutrophils Relative %: 76 %
PLATELETS: 275 10*3/uL (ref 150–440)
RBC: 3.84 MIL/uL — AB (ref 4.40–5.90)
RDW: 16.7 % — ABNORMAL HIGH (ref 11.5–14.5)
WBC: 10.1 10*3/uL (ref 3.8–10.6)

## 2017-03-27 LAB — COMPREHENSIVE METABOLIC PANEL WITH GFR
ALT: 18 U/L (ref 17–63)
AST: 25 U/L (ref 15–41)
Albumin: 3.2 g/dL — ABNORMAL LOW (ref 3.5–5.0)
Alkaline Phosphatase: 72 U/L (ref 38–126)
Anion gap: 9 (ref 5–15)
BUN: 28 mg/dL — ABNORMAL HIGH (ref 6–20)
CO2: 22 mmol/L (ref 22–32)
Calcium: 9.3 mg/dL (ref 8.9–10.3)
Chloride: 101 mmol/L (ref 101–111)
Creatinine, Ser: 1.69 mg/dL — ABNORMAL HIGH (ref 0.61–1.24)
GFR calc Af Amer: 47 mL/min — ABNORMAL LOW (ref >60)
GFR calc non Af Amer: 41 mL/min — ABNORMAL LOW (ref >60)
Glucose, Bld: 297 mg/dL — ABNORMAL HIGH (ref 65–99)
Potassium: 4.4 mmol/L (ref 3.5–5.1)
Sodium: 132 mmol/L — ABNORMAL LOW (ref 135–145)
Total Bilirubin: 0.7 mg/dL (ref 0.3–1.2)
Total Protein: 7.4 g/dL (ref 6.5–8.1)

## 2017-03-27 MED ORDER — ATROPINE SULFATE 1 MG/ML IJ SOLN
0.5000 mg | Freq: Once | INTRAMUSCULAR | Status: AC | PRN
  Administered 2017-03-27: 0.5 mg via INTRAVENOUS
  Filled 2017-03-27: qty 1

## 2017-03-27 MED ORDER — SODIUM CHLORIDE 0.9 % IV SOLN
Freq: Once | INTRAVENOUS | Status: AC
  Administered 2017-03-27: 11:00:00 via INTRAVENOUS
  Filled 2017-03-27: qty 1000

## 2017-03-27 MED ORDER — DEXAMETHASONE SODIUM PHOSPHATE 10 MG/ML IJ SOLN
10.0000 mg | Freq: Once | INTRAMUSCULAR | Status: AC
  Administered 2017-03-27: 10 mg via INTRAVENOUS
  Filled 2017-03-27: qty 1

## 2017-03-27 MED ORDER — LEUCOVORIN CALCIUM INJECTION 350 MG
950.0000 mg | Freq: Once | INTRAVENOUS | Status: AC
  Administered 2017-03-27: 950 mg via INTRAVENOUS
  Filled 2017-03-27: qty 47.5

## 2017-03-27 MED ORDER — HEPARIN SOD (PORK) LOCK FLUSH 100 UNIT/ML IV SOLN: 500.0000 [IU] | Freq: Once | INTRAVENOUS | Status: DC

## 2017-03-27 MED ORDER — IRINOTECAN HCL CHEMO INJECTION 100 MG/5ML: 400.0000 mg | Freq: Once | INTRAVENOUS | Status: DC

## 2017-03-27 MED ORDER — SODIUM CHLORIDE 0.9 % IV SOLN
2400.0000 mg/m2 | INTRAVENOUS | Status: DC
  Administered 2017-03-27: 5700 mg via INTRAVENOUS
  Filled 2017-03-27: qty 114

## 2017-03-27 MED ORDER — SODIUM CHLORIDE 0.9% FLUSH
10.0000 mL | INTRAVENOUS | Status: DC | PRN
  Administered 2017-03-27: 10 mL via INTRAVENOUS
  Filled 2017-03-27: qty 10

## 2017-03-27 MED ORDER — PALONOSETRON HCL INJECTION 0.25 MG/5ML
0.2500 mg | Freq: Once | INTRAVENOUS | Status: AC
  Administered 2017-03-27: 0.25 mg via INTRAVENOUS
  Filled 2017-03-27: qty 5

## 2017-03-27 MED ORDER — DEXTROSE 5 % IV SOLN
150.0000 mg/m2 | Freq: Once | INTRAVENOUS | Status: AC
  Administered 2017-03-27: 360 mg via INTRAVENOUS
  Filled 2017-03-27: qty 14

## 2017-03-27 MED ORDER — SODIUM CHLORIDE 0.9 % IV SOLN: 10.0000 mg | Freq: Once | INTRAVENOUS | Status: DC

## 2017-03-27 NOTE — Progress Notes (Signed)
[facility] NOTE  Patient Care Team: Dion Body, MD as PCP - General (Family Medicine) Clent Jacks, RN as Registered Nurse  CHIEF COMPLAINTS/PURPOSE OF CONSULTATION: RECTAL CANCER  #  Oncology History   # 2012- RECTO-SIGMOID-poorly diff STAGE III CA [s/p neo-adj chemo-RT- poor response]; APR [residual ypT3; 5/8 LN positiveDr.Smith/Dr.Pandit]  S/p FOLFOX   # SEP 2019- Pre-sacral fluid/mass- increasing ~ 5-6 cm [increased from dec 2017; also NEW retroperitoneal lymph nodes; inguinal adenopathy]; SEP 2018- left supraclavicular; mediastinal; right hilar-retroperitoneal; pelvic mass/node; bilateral inguinal adenopathy; s/p right Ingiunal LNBx - RECURRENT CA [necrotic;insuff tissue for F-One]  # OCT 8th 2018-FOLFIRI [first cycle 5FU alone]; OCT 22nd FOLFIRI  #   ------------------------------  ---  OCT-NOV 2018-Sepsis x2 sec to UTI [s/p ABx;]  # s/p RT rectal [11/28-last treatment]  # Acute renal insuff [s/p bil PCN]; hematuria- sec to bladder invol [? Contra-indication to avastin]  # LEFT LE DVT [? [facility]- Dr.Schneir]; Eliquis.   # CHF/COPD/CKD/ PN sec to [place]  # [facility] One-PDL-1/ TPS- 0% [2012-rectal specimen]; pending     Rectal cancer ([place])    HISTORY OF PRESENTING ILLNESS:  Donald Townsend 66 y.o.  male  above History of recurrent metastatic rectal cancer currently on FOLFIRI status post cycle #2 is here for follow-up.  Cycle #3 was interrupted because of recent admission to hospital for sepsis/UTI.  Patient had had his percutaneous nephrostomy tubes exchanged.  He is currently status post ciprofloxacin.  Patient denies any fevers or chills. Denies any diarrhea. Denies any sores in the mouth.  No nausea no vomiting. Chronic mild back pain. Patient denies any pain. He has chronic mild swelling of the left lower extremity.   ROS: A complete 10 point review of system is done which is negative except mentioned above in history of  present illness.   MEDICAL HISTORY:  Past Medical History:  Diagnosis Date  . A-fib ([place])   . Cancer [place])    Colon  . CHF (congestive heart failure) ([place])   . Diabetes mellitus without complication ([place])   . Dyspnea   . Elevated lipids   . Hypertension   . PVD (peripheral vascular disease) ([place])     SURGICAL HISTORY: Past Surgical History:  Procedure Laterality Date  . COLON RESECTION     with colostomy  . COLONOSCOPY    . CYSTOSCOPY WITH STENT PLACEMENT Bilateral 01/18/2017   Procedure: CYSTOSCOPY WITH STENT PLACEMENT;  Surgeon: [name], MD;  Location: [facility] ORS;  Service: Urology;  Laterality: Bilateral;  . IR FLUORO GUIDE PORT INSERTION RIGHT  01/24/2017  . IR NEPHROSTOMY EXCHANGE LEFT  03/14/2017  . IR NEPHROSTOMY EXCHANGE RIGHT  03/14/2017  . IR NEPHROSTOMY PLACEMENT LEFT  01/19/2017  . IR NEPHROSTOMY PLACEMENT RIGHT  01/19/2017  . IVC FILTER REMOVAL N/A 08/28/2016   Procedure: IVC Filter Removal;  Surgeon: Katha Cabal, MD;  Location: [facility];  Service: Cardiovascular;  Laterality: N/A;  . LOWER EXTREMITY VENOGRAPHY Left 12/18/2016   Procedure: Lower Extremity Venography;  Surgeon: Katha Cabal, MD;  Location: [facility];  Service: Cardiovascular;  Laterality: Left;  . PERIPHERAL VASCULAR CATHETERIZATION Left 05/08/2016   Procedure: Lower Extremity Venography;  Surgeon: Katha Cabal, MD;  Location: [facility];  Service: Cardiovascular;  Laterality: Left;    SOCIAL HISTORY: [place]; retdOccupational psychologist; currently works part time. He lives alone. No smoking occasional alcohol. Social History   Socioeconomic History  . Marital  status: Divorced    Spouse name: Not on file  . Number of children: Not on file  . Years of education: Not on file  . Highest education level: Not on file  Social Needs  . Financial resource strain: Not on file  . Food insecurity - worry: Not on file  . Food insecurity - inability: Not on  file  . Transportation needs - medical: Not on file  . Transportation needs - non-medical: Not on file  Occupational History  . Not on file  Tobacco Use  . Smoking status: Never Smoker  . Smokeless tobacco: Never Used  Substance and Sexual Activity  . Alcohol use: No  . Drug use: No  . Sexual activity: Not on file  Other Topics Concern  . Not on file  Social History Narrative  . Not on file    FAMILY HISTORY: Family History  Problem Relation Age of Onset  . Diabetes Mother   . Stroke Mother   . Prostate cancer Brother   . Cancer Sister        Breast cancer  . Kidney cancer Neg Hx   . Bladder Cancer Neg Hx     ALLERGIES:  has No Known Allergies.  MEDICATIONS:  Current Outpatient Medications  Medication Sig Dispense Refill  . acidophilus (RISAQUAD) CAPS capsule Take 1 capsule daily by mouth. 30 capsule 0  . B-D ULTRAFINE III SHORT PEN 31G X 8 MM MISC     . Continuous Blood Gluc Receiver (FREESTYLE LIBRE READER) DEVI Use 1 Units as directed. Check CBG's four times daily. Dx: E11.9    . Continuous Blood Gluc Sensor (FREESTYLE LIBRE SENSOR SYSTEM) MISC Use 1 Units as directed. Check CBG's four times daily. Dx: E11.9    . cyclobenzaprine (FEXMID) 7.5 MG tablet Take 1 tablet (7.5 mg total) 3 (three) times daily as needed by mouth for muscle spasms. 30 tablet 0  . digoxin (LANOXIN) 0.25 MG tablet Take 0.25 mg by mouth daily.     [name] ELIQUIS 5 MG TABS tablet Take 5 mg by mouth every 12 (twelve) hours.  0  . feeding supplement, GLUCERNA SHAKE, ([place]) LIQD Follow manufacturer instructions    . fluconazole (DIFLUCAN) 200 MG tablet Take 1 tablet (200 mg total) daily by mouth. X 7 days 7 tablet 0  . HYDROcodone-acetaminophen (NORCO/VICODIN) 5-325 MG tablet Take 1 tablet by mouth every 6 (six) hours as needed for moderate pain. 90 tablet 0  . insulin aspart (NOVOLOG) 100 UNIT/ML FlexPen Inject 3 Units into the skin 3 (three) times daily with meals.     . Lancets 30G MISC Use 1  Units as directed. Check CBG's up to twice daily. Dx: E11.9    . LANTUS SOLOSTAR 100 UNIT/ML Solostar Pen Inject 10 Units at bedtime into the skin.   0  . metoprolol tartrate (LOPRESSOR) 25 MG tablet Take 25 mg by mouth 2 (two) times daily.     . ondansetron (ZOFRAN) 8 MG tablet 1 pill every 8 hours for nausea/vomitting as needed 40 tablet 2  . pravastatin (PRAVACHOL) 20 MG tablet take 36m by mouth daily at bedtime    . prochlorperazine (COMPAZINE) 10 MG tablet Take 1 tablet (10 mg total) by mouth every 6 (six) hours as needed for nausea or vomiting. 40 tablet 1   No current facility-administered medications for this visit.       .[name] PHYSICAL EXAMINATION: ECOG PERFORMANCE STATUS: 0 - Asymptomatic  Vitals:   03/27/17 0930  BP: ([name]  141/80  Pulse: 80  Resp: 20  Temp: 99.1 F (37.3 C)   Filed Weights   03/27/17 0930  Weight: 248 lb 12.8 oz (112.9 kg)    GENERAL: Well-nourished well-developed; Alert, no distress and comfortable.  He is alone. EYES: no pallor or icterus OROPHARYNX: no thrush or ulceration; good dentition  NECK: supple, no masses felt LYMPH:  no palpable lymphadenopathy in the cervical, axillary or inguinal regions LUNGS: clear to auscultation and  No wheeze or crackles HEART/CVS: regular rate & rhythm and no murmurs; No lower extremity edema ABDOMEN: abdomen soft, non-tender and normal bowel sounds; colostomy in place. Bilateral nephrostomy tubes in place. Musculoskeletal:no cyanosis of digits and no clubbing  PSYCH: alert & oriented x 3 with fluent speech NEURO: no focal motor/sensory deficits SKIN:  no rashes or significant lesions  LABORATORY DATA:  I have reviewed the data as listed Lab Results  Component Value Date   WBC 10.1 119-Aug-202018   HGB 11.3 (L) 119-Aug-202018   HCT 33.1 (L) 119-Aug-202018   MCV 86.1 119-Aug-202018   PLT 275 119-Aug-202018   Recent Labs    02/25/17 1034 03/14/17 1137  03/16/17 0437 03/17/17 0406 03/27/17 0858  NA 133* 131*   < > 136  136 132*  K 4.0 4.1   < > 4.0 4.2 4.4  CL 100* 97*   < > 103 104 101  CO2 22 21*   < > _0 GLUCOSE 221* 215*   < > 219* 208* 297*  BUN 22* 26*   < > 20 24* 28*  CREATININE 1.61* 1.75*   < > 1.58* 1.58* 1.69*  CALCIUM 9.0 9.3   < > 8.4* 8.7* 9.3  GFRNONAA 43* 39*   < > 44* 44* 41*  GFRAA 50* 45*   < > 51* 51* 47*  PROT 7.0 7.4  --   --   --  7.4  ALBUMIN 3.4* 3.4*  --   --   --  3.2*  AST 26 24  --   --   --  25  ALT 17 15*  --   --   --  18  ALKPHOS 75 71  --   --   --  72  BILITOT 1.1 1.0  --   --   --  0.7   < > = values in this interval not displayed.    RADIOGRAPHIC STUDIES: I have personally reviewed the radiological images as listed and agreed with the findings in the report. Dg Chest 2 View  Result Date: 03/14/2017 CLINICAL DATA:  Fever. History of colon cancer. Currently on chemoradiation. EXAM: CHEST  2 VIEW COMPARISON:  Chest x-ray dated January 31, 2017. FINDINGS: Right chest wall port catheter with tip at the cavoatrial junction, unchanged. The cardiomediastinal silhouette is normal in size. Normal pulmonary vascularity. No focal consolidation, pleural effusion, or pneumothorax. No acute osseous abnormality. IMPRESSION: No active cardiopulmonary disease. Electronically Signed   By: Titus Dubin M.D.   On: 03/14/2017 12:14   US Renal  Result Date: 03/14/2017 CLINICAL DATA:  Fever chills and nephrostomy tube EXAM: RENAL / URINARY TRACT ULTRASOUND COMPLETE COMPARISON:  PET-CT 01/22/2017 FINDINGS: Right Kidney: Length: 11.9 cm. Cortical echogenicity within normal limits. Partially visualize right nephrostomy tube without significant hydronephrosis or perinephric collection. Left Kidney: Length: 10.3 cm. Cortical echogenicity within normal limits. Partially visualized nephrostomy tube in the lower pole. No perinephric collection Bladder: Poorly defined with probable wall thickening. IMPRESSION: 1. Bilateral partially visualized nephrostomy tubes.  No significant  hydronephrosis. No perinephric fluid collections by sonography 2. Poorly visible urinary bladder. Electronically Signed   By: Donavan Foil M.D.   On: 03/14/2017 15:16   Ir Nephrostomy Exchange Left  Result Date: 03/14/2017 INDICATION: History of rectal cancer with bilateral obstructive uropathy, post bilateral ultrasound fluoroscopic guided nephrostomy catheter placement on 01/19/2017 by Dr. Kathlene Cote. Patient now presents difference department with concern for impending urosepsis neck soft, request made for fluoroscopic guided exchange of bilateral percutaneous nephrostomy catheters. EXAM: FLUOROSCOPIC GUIDED BILATERAL SIDED NEPHROSTOMY CATHETER EXCHANGE COMPARISON:  None. CONTRAST:  A total of 20 mL Isovue-300 administered was administered into both collecting systems FLUOROSCOPY TIME:  3 minutes 30 seconds (374 mGy) COMPLICATIONS: None immediate. TECHNIQUE: Informed written consent was obtained from the patient after a discussion of the risks, benefits and alternatives to treatment. Questions regarding the procedure were encouraged and answered. A timeout was performed prior to the initiation of the procedure. The bilateral flanks and external portions of existing nephrostomy catheters were prepped and draped in the usual sterile fashion. A sterile drape was applied covering the operative field. Maximum barrier sterile technique with sterile gowns and gloves were used for the procedure. A timeout was performed prior to the initiation of the procedure. A pre procedural spot fluoroscopic image was obtained. Beginning with the left-sided nephrostomy, a small amount of contrast was injected via the existing left-sided nephrostomy catheter demonstrating appropriate positioning within the renal pelvis. The existing nephrostomy catheter was cut and cannulated with a Benson wire which was coiled within the renal pelvis. Under intermittent fluoroscopic guidance, the existing nephrostomy catheter was exchanged for a  new 10.2 [place] all-purpose drainage catheter. Limited contrast injection confirmed appropriate positioning within the left renal pelvis and a post exchange fluoroscopic image was obtained. The catheter was locked, secured to the skin with an interrupted suture and reconnected to a gravity bag. The identical repeat procedure was repeated for the contralateral right-sided nephrostomy, ultimately allowing successful exchange of a new 10.2 [place] all-purpose drainage catheter with end coiled and locked within the right renal pelvis. Dressings were placed. The patient tolerated the above procedures well without immediate postprocedural complication. FINDINGS: The existing nephrostomy catheters are appropriately positioned and functioning. After successful fluoroscopic guided exchange, new bilateral 10.2 French nephrostomy catheters are coiled and locked within the respective renal pelvises. IMPRESSION: Successful fluoroscopic guided exchange of bilateral 10.2 French percutaneous nephrostomy catheters. Electronically Signed   By: Sandi Mariscal M.D.   On: 03/14/2017 16:59   Ir Nephrostomy Exchange Right  Result Date: 03/14/2017 INDICATION: History of rectal cancer with bilateral obstructive uropathy, post bilateral ultrasound fluoroscopic guided nephrostomy catheter placement on 01/19/2017 by Dr. Kathlene Cote. Patient now presents difference department with concern for impending urosepsis neck soft, request made for fluoroscopic guided exchange of bilateral percutaneous nephrostomy catheters. EXAM: FLUOROSCOPIC GUIDED BILATERAL SIDED NEPHROSTOMY CATHETER EXCHANGE COMPARISON:  None. CONTRAST:  A total of 20 mL Isovue-300 administered was administered into both collecting systems FLUOROSCOPY TIME:  3 minutes 30 seconds (827 mGy) COMPLICATIONS: None immediate. TECHNIQUE: Informed written consent was obtained from the patient after a discussion of the risks, benefits and alternatives to treatment. Questions regarding the  procedure were encouraged and answered. A timeout was performed prior to the initiation of the procedure. The bilateral flanks and external portions of existing nephrostomy catheters were prepped and draped in the usual sterile fashion. A sterile drape was applied covering the operative field. Maximum barrier sterile technique with sterile gowns and gloves were used for the procedure.  A timeout was performed prior to the initiation of the procedure. A pre procedural spot fluoroscopic image was obtained. Beginning with the left-sided nephrostomy, a small amount of contrast was injected via the existing left-sided nephrostomy catheter demonstrating appropriate positioning within the renal pelvis. The existing nephrostomy catheter was cut and cannulated with a Benson wire which was coiled within the renal pelvis. Under intermittent fluoroscopic guidance, the existing nephrostomy catheter was exchanged for a new 10.2 [place] all-purpose drainage catheter. Limited contrast injection confirmed appropriate positioning within the left renal pelvis and a post exchange fluoroscopic image was obtained. The catheter was locked, secured to the skin with an interrupted suture and reconnected to a gravity bag. The identical repeat procedure was repeated for the contralateral right-sided nephrostomy, ultimately allowing successful exchange of a new 10.2 [place] all-purpose drainage catheter with end coiled and locked within the right renal pelvis. Dressings were placed. The patient tolerated the above procedures well without immediate postprocedural complication. FINDINGS: The existing nephrostomy catheters are appropriately positioned and functioning. After successful fluoroscopic guided exchange, new bilateral 10.2 French nephrostomy catheters are coiled and locked within the respective renal pelvises. IMPRESSION: Successful fluoroscopic guided exchange of bilateral 10.2 French percutaneous nephrostomy catheters. Electronically  Signed   By: Sandi Mariscal M.D.   On: 03/14/2017 16:59    ASSESSMENT & PLAN:   Rectal cancer ([place]) # RECURRENT RECTAL CANCER STAGE IV- [s/p LN Bx]; currently on 5FU infusion cycle #2; tolerated well-except for recent admission to the hospital for sepsis/UTI.  However CEA improving.  # will plan to proceed with FOLFIRI # 3 today [will dose reduce irinotecan-150 mg/m square; 5-FU 2000 mg/m square-given recent sepsis]Labs today reviewed;  acceptable for treatment today.   # Hematuria-likely secondary to bladder involvement by the tumor. Continue eliquis-given the extensive DVT. HOLD avastin.   #  CKD- secondary to bilateral hydronephrosis from the underlying pelvic mass. S/p PNC bil. Renal function improving/stabilizing with creatinine at 1.6-STABLE.   # Back pain- secondary to pelvic mass/PCN; continue hydrocodone as needed  # Acute DVT Left -continue Eliquis. No hematuria noted.   # DM-2 ; on insulin/ amaryl;On Lantus. Discussed the patient will have elevated blood sugars around the time of chemo-secondary to steroids. Recommend checking blood sugars at least 3 times a day. If elevated- report to PCP  # follow up in 2 weeks/labs;FOLFIRI/labs/CEA; 1 week-cbc/bmp.   Cc; Dr.L  All questions were answered. The patient knows to call the clinic with any problems, questions or concerns.    Cammie Sickle, MD 03/28/2017 6:41 PM

## 2017-03-27 NOTE — Assessment & Plan Note (Addendum)
#   RECURRENT RECTAL CANCER STAGE IV- [s/p LN Bx]; currently on 5FU infusion cycle #2; tolerated well-except for recent admission to the hospital for sepsis/UTI.  However CEA improving.  # will plan to proceed with FOLFIRI # 3 today [will dose reduce irinotecan-150 mg/m square; 5-FU 2000 mg/m square-given recent sepsis]Labs today reviewed;  acceptable for treatment today.   # Hematuria-likely secondary to bladder involvement by the tumor. Continue eliquis-given the extensive DVT. HOLD avastin.   #  CKD- secondary to bilateral hydronephrosis from the underlying pelvic mass. S/p PNC bil. Renal function improving/stabilizing with creatinine at 1.6-STABLE.   # Back pain- secondary to pelvic mass/PCN; continue hydrocodone as needed  # Acute DVT Left -continue Eliquis. No hematuria noted.   # DM-2 ; on insulin/ amaryl;On Lantus. Discussed the patient will have elevated blood sugars around the time of chemo-secondary to steroids. Recommend checking blood sugars at least 3 times a day. If elevated- report to PCP  # follow up in 2 weeks/labs;FOLFIRI/labs/CEA; 1 week-cbc/bmp.   Cc; Dr.L

## 2017-03-27 NOTE — Telephone Encounter (Signed)
Spoke with Dr. Burlene Arnt. He wants Ironatecan dose to be 150mg /m2. A decrease from 180mg /m2

## 2017-03-28 ENCOUNTER — Ambulatory Visit: Payer: Medicare Other

## 2017-03-29 ENCOUNTER — Inpatient Hospital Stay: Payer: Medicare Other

## 2017-03-29 ENCOUNTER — Ambulatory Visit: Payer: Medicare Other

## 2017-03-29 VITALS — BP 129/78 | HR 88 | Temp 97.1°F | Resp 20

## 2017-03-29 DIAGNOSIS — C778 Secondary and unspecified malignant neoplasm of lymph nodes of multiple regions: Secondary | ICD-10-CM | POA: Diagnosis not present

## 2017-03-29 DIAGNOSIS — N189 Chronic kidney disease, unspecified: Secondary | ICD-10-CM | POA: Diagnosis not present

## 2017-03-29 DIAGNOSIS — C20 Malignant neoplasm of rectum: Secondary | ICD-10-CM

## 2017-03-29 DIAGNOSIS — I129 Hypertensive chronic kidney disease with stage 1 through stage 4 chronic kidney disease, or unspecified chronic kidney disease: Secondary | ICD-10-CM | POA: Diagnosis not present

## 2017-03-29 DIAGNOSIS — R319 Hematuria, unspecified: Secondary | ICD-10-CM | POA: Diagnosis not present

## 2017-03-29 DIAGNOSIS — Z5111 Encounter for antineoplastic chemotherapy: Secondary | ICD-10-CM | POA: Diagnosis not present

## 2017-03-29 MED ORDER — SODIUM CHLORIDE 0.9% FLUSH
10.0000 mL | INTRAVENOUS | Status: DC | PRN
  Administered 2017-03-29: 10 mL
  Filled 2017-03-29: qty 10

## 2017-03-29 MED ORDER — HEPARIN SOD (PORK) LOCK FLUSH 100 UNIT/ML IV SOLN
500.0000 [IU] | Freq: Once | INTRAVENOUS | Status: AC | PRN
Start: 2017-03-29 — End: 2017-03-29
  Administered 2017-03-29: 500 [IU]

## 2017-04-01 ENCOUNTER — Ambulatory Visit: Payer: Medicare Other

## 2017-04-02 ENCOUNTER — Ambulatory Visit: Payer: Medicare Other

## 2017-04-03 ENCOUNTER — Ambulatory Visit: Payer: Medicare Other

## 2017-04-03 ENCOUNTER — Inpatient Hospital Stay: Payer: Medicare Other | Attending: Internal Medicine

## 2017-04-04 ENCOUNTER — Ambulatory Visit: Payer: Self-pay

## 2017-04-04 ENCOUNTER — Ambulatory Visit: Payer: Medicare Other

## 2017-04-05 ENCOUNTER — Ambulatory Visit: Payer: Medicare Other

## 2017-04-05 ENCOUNTER — Other Ambulatory Visit: Payer: Self-pay | Admitting: Radiology

## 2017-04-05 ENCOUNTER — Ambulatory Visit (INDEPENDENT_AMBULATORY_CARE_PROVIDER_SITE_OTHER): Payer: Medicare Other | Admitting: Urology

## 2017-04-05 ENCOUNTER — Ambulatory Visit: Payer: Medicare Other | Admitting: Urology

## 2017-04-05 VITALS — BP 116/77 | HR 86 | Temp 97.8°F | Ht 70.0 in | Wt 251.6 lb

## 2017-04-05 DIAGNOSIS — R369 Urethral discharge, unspecified: Secondary | ICD-10-CM | POA: Diagnosis not present

## 2017-04-05 DIAGNOSIS — R310 Gross hematuria: Secondary | ICD-10-CM

## 2017-04-05 DIAGNOSIS — N1330 Unspecified hydronephrosis: Secondary | ICD-10-CM | POA: Diagnosis not present

## 2017-04-05 DIAGNOSIS — C20 Malignant neoplasm of rectum: Secondary | ICD-10-CM | POA: Diagnosis not present

## 2017-04-05 NOTE — Progress Notes (Signed)
04/05/2017 3:32 PM   Donald Townsend 08-18-1950 [id number]  Referring provider: Dion Body, MD [facility] [place], [place] [address]  Chief Complaint  Patient presents with  . 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  At that time, he was noted to have significant distortion of the normal bladder architecture related to presumed infiltration of colon cancer invading into the bladder wall.   He continues to have bleeding from his penis likely secondary to an infiltrative mass.  He completed palliative radiation to the pelvis on 12020-09-1316.  Unfortunately, he was admitted in mid November for another episode of urosepsis.  His nephrostomy tubes were exchanged at that time.  At the nephrostomy tubes grew Enterobacter resistant to Ancef and Macrobid.  He had a previous episode of urosepsis in early October growing Klebsiella resistant to ampicillin.  He is under the care of Dr. Rogue Bussing, currently undergoing treatment with [facility].  With treatment, his CEA is falling.  He seems to be tolerating chemotherapy well.  He is accompanied today by his daughter who continues to have concerns about his care.  PMH: Past Medical History:  Diagnosis Date  . A-fib ([place])   . Cancer [facility])    Colon  . CHF (congestive heart failure) ([place])   . Diabetes mellitus without complication ([place])   . Dyspnea   . Elevated lipids   . Hypertension   . PVD (peripheral vascular disease) [facility])     Surgical History: Past Surgical History:  Procedure Laterality Date  . COLON RESECTION     with colostomy  . COLONOSCOPY    . CYSTOSCOPY WITH STENT PLACEMENT Bilateral 01/18/2017   Procedure: CYSTOSCOPY WITH STENT PLACEMENT;  Surgeon: [name], MD;  Location: [facility] ORS;  Service: Urology;  Laterality: Bilateral;  . IR FLUORO GUIDE PORT INSERTION RIGHT  01/24/2017  . IR NEPHROSTOMY EXCHANGE LEFT  03/14/2017  . IR NEPHROSTOMY EXCHANGE RIGHT  03/14/2017  . IR NEPHROSTOMY PLACEMENT LEFT  01/19/2017  . IR NEPHROSTOMY PLACEMENT RIGHT  01/19/2017  . IVC FILTER REMOVAL N/A 08/28/2016   Procedure: IVC Filter Removal;  Surgeon: Katha Cabal, MD;  Location: [place] [facility];  Service: Cardiovascular;  Laterality: N/A;  . LOWER EXTREMITY VENOGRAPHY Left 12/18/2016   Procedure: Lower Extremity Venography;  Surgeon: Katha Cabal, MD;  Location: [place] [facility];  Service: Cardiovascular;  Laterality: Left;  . PERIPHERAL VASCULAR CATHETERIZATION Left 05/08/2016   Procedure: Lower Extremity Venography;  Surgeon: Katha Cabal, MD;  Location: [place] [facility];  Service: Cardiovascular;  Laterality: Left;    Home Medications:  Allergies as of 04/05/2017   No Known Allergies     Medication List        Accurate as of 04/05/17  3:32 PM. Always use your most recent med list.          acidophilus Caps capsule Take 1 capsule daily by mouth.   B-D ULTRAFINE III SHORT PEN 31G X 8 MM Misc Generic drug:  Insulin Pen Needle   cyclobenzaprine 7.5 MG tablet Commonly known as:  FEXMID Take 1 tablet (7.5 mg total) 3 (three) times daily as needed by mouth for muscle spasms.   digoxin 0.25 MG tablet Commonly known as:  LANOXIN Take 0.25 mg by mouth daily.   ELIQUIS 5  MG Tabs tablet Generic drug:  apixaban Take 5 mg by mouth every 12 (twelve) hours.   feeding supplement (GLUCERNA SHAKE) Liqd Follow manufacturer instructions   FREESTYLE LIBRE READER Devi Use 1 Units as directed. Check CBG's four times daily. Dx: E11.9   FREESTYLE LIBRE SENSOR SYSTEM Misc Use 1 Units as directed. Check CBG's four times daily. Dx: E11.9   glimepiride 4 MG tablet Commonly known as:  AMARYL Take 1 tablet by mouth daily.     HYDROcodone-acetaminophen 5-325 MG tablet Commonly known as:  NORCO/VICODIN Take 1 tablet by mouth every 6 (six) hours as needed for moderate pain.   insulin aspart 100 UNIT/ML FlexPen Commonly known as:  NOVOLOG Inject 7 Units into the skin 3 (three) times daily with meals.   Lancets 30G Misc Use 1 Units as directed. Check CBG's up to twice daily. Dx: E11.9   LANTUS SOLOSTAR 100 UNIT/ML Solostar Pen Generic drug:  Insulin Glargine Inject 20 Units into the skin at bedtime.   metoprolol tartrate 25 MG tablet Commonly known as:  LOPRESSOR Take 25 mg by mouth 2 (two) times daily.   ondansetron 8 MG tablet Commonly known as:  ZOFRAN 1 pill every 8 hours for nausea/vomitting as needed   pravastatin 20 MG tablet Commonly known as:  PRAVACHOL take 20mg  by mouth daily at bedtime   prochlorperazine 10 MG tablet Commonly known as:  COMPAZINE Take 1 tablet (10 mg total) by mouth every 6 (six) hours as needed for nausea or vomiting.       Allergies: No Known Allergies  Family History: Family History  Problem Relation Age of Onset  . Diabetes Mother   . Stroke Mother   . Prostate cancer Brother   . Cancer Sister        Breast cancer  . Kidney cancer Neg Hx   . Bladder Cancer Neg Hx     Social History:  reports that  has never smoked. he has never used smokeless tobacco. He reports that he does not drink alcohol or use drugs.  ROS: UROLOGY Frequent Urination?: No Hard to postpone urination?: No Burning/pain with urination?: No Get up at night to urinate?: No Leakage of urine?: No Urine stream starts and stops?: No Trouble starting stream?: No Do you have to strain to urinate?: No Blood in urine?: No Urinary tract infection?: No Sexually transmitted disease?: No Injury to kidneys or bladder?: No Painful intercourse?: No Weak stream?: No Erection problems?: No Penile pain?: No  Gastrointestinal Nausea?: No Vomiting?: No Indigestion/heartburn?: No Diarrhea?:  No Constipation?: No  Constitutional Fever: No Night sweats?: No Weight loss?: No Fatigue?: No  Skin Skin rash/lesions?: No Itching?: No  Eyes Blurred vision?: No Double vision?: No  Ears/Nose/Throat Sore throat?: No Sinus problems?: No  Hematologic/Lymphatic Swollen glands?: No Easy bruising?: No  Cardiovascular Leg swelling?: No Chest pain?: No  Respiratory Cough?: No Shortness of breath?: No  Endocrine Excessive thirst?: No  Musculoskeletal Back pain?: No Joint pain?: No  Neurological Headaches?: No Dizziness?: No  Psychologic Depression?: No Anxiety?: No  Physical Exam: BP 116/77 (BP Location: Right Arm, Patient Position: Sitting, Cuff Size: Large)   Pulse 86   Temp 97.8 F (36.6 C) (Oral)   Ht 5\' 10"  (1.778 m)   Wt 251 lb 9.6 oz (114.1 kg)   BMI 36.10 kg/m   Constitutional:  Alert and oriented, No acute distress.  Accompanied by daughter today. HEENT: [place] AT, moist mucus membranes.  Trachea midline, no masses. Cardiovascular: No clubbing, cyanosis,  or edema. Respiratory: Normal respiratory effort, no increased work of breathing. GI: Abdomen is soft, nontender, nondistended, no abdominal masses GU:  Bilateral nephrostomy tubes in place, each draining clear yellow urine.  Skin: No rashes, bruises or suspicious lesions. Neurologic: Grossly intact, no focal deficits, moving all 4 extremities. Psychiatric: Normal mood and affect.  Laboratory Data: Lab Results  Component Value Date   WBC 10.1 111/25/202018   HGB 11.3 (L) 111/25/202018   HCT 33.1 (L) 111/25/202018   MCV 86.1 111/25/202018   PLT 275 111/25/202018    Lab Results  Component Value Date   CREATININE 1.69 (H) 111/25/202018    Lab Results  Component Value Date   PSA1 0.2 01/01/2017    Lab Results  Component Value Date   HGBA1C 7.5 (H) 01/19/2017    Urinalysis N/a   Pertinent Imaging: No new interval imaging  Assessment & Plan:    1. Gross hematuria Bleeding likely secondary to  infiltrative bladder mass S/p palliative XRT Improving slightly  2. Rectal cancer ([place]) Stage IV, managed by Dr. Tish Men Currently tolerating well  3. AKI (acute kidney injury) ([place]) Creatinine stabilized, nadir ~1.5  4. Hydronephrosis, bilateral Due for tube exchange, will increase frequency to q6 weeks (scheduled for early Jan 2019) Preo-procedure UCx today by aspiration from nephrostomy tubes Will plan to treat 3 days around the time of tube exchanges as abx ppx High risk for urosespis due to indwelling nephrostomy tubes and immunocompromise from chemo- lengthy discussion today primarily with daughter  Hollice Espy, MD  [facility] [address] [place], [place] [address] [phone]  I spent 25 min with this patient of which greater than 50% was spent in counseling and coordination of care with the patient.

## 2017-04-09 LAB — CULTURE, URINE COMPREHENSIVE

## 2017-04-10 ENCOUNTER — Other Ambulatory Visit: Payer: Self-pay

## 2017-04-10 ENCOUNTER — Inpatient Hospital Stay: Payer: Medicare Other | Attending: Internal Medicine

## 2017-04-10 ENCOUNTER — Inpatient Hospital Stay: Payer: Medicare Other

## 2017-04-10 ENCOUNTER — Inpatient Hospital Stay (HOSPITAL_BASED_OUTPATIENT_CLINIC_OR_DEPARTMENT_OTHER): Payer: Medicare Other | Admitting: Internal Medicine

## 2017-04-10 ENCOUNTER — Encounter: Payer: Self-pay | Admitting: Internal Medicine

## 2017-04-10 VITALS — BP 113/72 | HR 84 | Temp 98.5°F | Resp 20 | Ht 70.0 in | Wt 249.0 lb

## 2017-04-10 DIAGNOSIS — E785 Hyperlipidemia, unspecified: Secondary | ICD-10-CM

## 2017-04-10 DIAGNOSIS — N189 Chronic kidney disease, unspecified: Secondary | ICD-10-CM

## 2017-04-10 DIAGNOSIS — C778 Secondary and unspecified malignant neoplasm of lymph nodes of multiple regions: Secondary | ICD-10-CM | POA: Insufficient documentation

## 2017-04-10 DIAGNOSIS — J449 Chronic obstructive pulmonary disease, unspecified: Secondary | ICD-10-CM

## 2017-04-10 DIAGNOSIS — M549 Dorsalgia, unspecified: Secondary | ICD-10-CM | POA: Diagnosis not present

## 2017-04-10 DIAGNOSIS — M7989 Other specified soft tissue disorders: Secondary | ICD-10-CM | POA: Insufficient documentation

## 2017-04-10 DIAGNOSIS — E119 Type 2 diabetes mellitus without complications: Secondary | ICD-10-CM | POA: Insufficient documentation

## 2017-04-10 DIAGNOSIS — R319 Hematuria, unspecified: Secondary | ICD-10-CM

## 2017-04-10 DIAGNOSIS — C20 Malignant neoplasm of rectum: Secondary | ICD-10-CM

## 2017-04-10 DIAGNOSIS — Z803 Family history of malignant neoplasm of breast: Secondary | ICD-10-CM | POA: Diagnosis not present

## 2017-04-10 DIAGNOSIS — Z5111 Encounter for antineoplastic chemotherapy: Secondary | ICD-10-CM | POA: Insufficient documentation

## 2017-04-10 DIAGNOSIS — I4891 Unspecified atrial fibrillation: Secondary | ICD-10-CM

## 2017-04-10 DIAGNOSIS — Z923 Personal history of irradiation: Secondary | ICD-10-CM

## 2017-04-10 DIAGNOSIS — Z86718 Personal history of other venous thrombosis and embolism: Secondary | ICD-10-CM

## 2017-04-10 DIAGNOSIS — G629 Polyneuropathy, unspecified: Secondary | ICD-10-CM | POA: Insufficient documentation

## 2017-04-10 DIAGNOSIS — Z7901 Long term (current) use of anticoagulants: Secondary | ICD-10-CM

## 2017-04-10 DIAGNOSIS — Z9221 Personal history of antineoplastic chemotherapy: Secondary | ICD-10-CM

## 2017-04-10 DIAGNOSIS — Z8042 Family history of malignant neoplasm of prostate: Secondary | ICD-10-CM | POA: Insufficient documentation

## 2017-04-10 DIAGNOSIS — R1900 Intra-abdominal and pelvic swelling, mass and lump, unspecified site: Secondary | ICD-10-CM | POA: Diagnosis not present

## 2017-04-10 DIAGNOSIS — N136 Pyonephrosis: Secondary | ICD-10-CM | POA: Diagnosis not present

## 2017-04-10 DIAGNOSIS — R05 Cough: Secondary | ICD-10-CM | POA: Diagnosis not present

## 2017-04-10 DIAGNOSIS — I509 Heart failure, unspecified: Secondary | ICD-10-CM | POA: Insufficient documentation

## 2017-04-10 DIAGNOSIS — Z87440 Personal history of urinary (tract) infections: Secondary | ICD-10-CM | POA: Diagnosis not present

## 2017-04-10 DIAGNOSIS — R0602 Shortness of breath: Secondary | ICD-10-CM

## 2017-04-10 DIAGNOSIS — Z79899 Other long term (current) drug therapy: Secondary | ICD-10-CM | POA: Insufficient documentation

## 2017-04-10 DIAGNOSIS — Z794 Long term (current) use of insulin: Secondary | ICD-10-CM | POA: Insufficient documentation

## 2017-04-10 DIAGNOSIS — I739 Peripheral vascular disease, unspecified: Secondary | ICD-10-CM

## 2017-04-10 DIAGNOSIS — I129 Hypertensive chronic kidney disease with stage 1 through stage 4 chronic kidney disease, or unspecified chronic kidney disease: Secondary | ICD-10-CM | POA: Insufficient documentation

## 2017-04-10 LAB — COMPREHENSIVE METABOLIC PANEL
ALK PHOS: 73 U/L (ref 38–126)
ALT: 17 U/L (ref 17–63)
AST: 20 U/L (ref 15–41)
Albumin: 3.3 g/dL — ABNORMAL LOW (ref 3.5–5.0)
Anion gap: 10 (ref 5–15)
BUN: 24 mg/dL — AB (ref 6–20)
CALCIUM: 8.6 mg/dL — AB (ref 8.9–10.3)
CO2: 23 mmol/L (ref 22–32)
CREATININE: 1.58 mg/dL — AB (ref 0.61–1.24)
Chloride: 99 mmol/L — ABNORMAL LOW (ref 101–111)
GFR, EST AFRICAN AMERICAN: 51 mL/min — AB (ref >60)
GFR, EST NON AFRICAN AMERICAN: 44 mL/min — AB (ref >60)
Glucose, Bld: 356 mg/dL — ABNORMAL HIGH (ref 65–99)
Potassium: 3.6 mmol/L (ref 3.5–5.1)
SODIUM: 132 mmol/L — AB (ref 135–145)
Total Bilirubin: 0.6 mg/dL (ref 0.3–1.2)
Total Protein: 7 g/dL (ref 6.5–8.1)

## 2017-04-10 LAB — CBC WITH DIFFERENTIAL/PLATELET
BASOS ABS: 0.1 10*3/uL (ref 0–0.1)
Basophils Relative: 1 %
EOS PCT: 9 %
Eosinophils Absolute: 0.5 10*3/uL (ref 0–0.7)
HEMATOCRIT: 32.2 % — AB (ref 40.0–52.0)
Hemoglobin: 11.1 g/dL — ABNORMAL LOW (ref 13.0–18.0)
LYMPHS ABS: 0.6 10*3/uL — AB (ref 1.0–3.6)
LYMPHS PCT: 12 %
MCH: 29.8 pg (ref 26.0–34.0)
MCHC: 34.5 g/dL (ref 32.0–36.0)
MCV: 86.3 fL (ref 80.0–100.0)
MONO ABS: 0.6 10*3/uL (ref 0.2–1.0)
MONOS PCT: 10 %
NEUTROS ABS: 3.8 10*3/uL (ref 1.4–6.5)
Neutrophils Relative %: 68 %
PLATELETS: 164 10*3/uL (ref 150–440)
RBC: 3.73 MIL/uL — ABNORMAL LOW (ref 4.40–5.90)
RDW: 16.5 % — AB (ref 11.5–14.5)
WBC: 5.6 10*3/uL (ref 3.8–10.6)

## 2017-04-10 MED ORDER — HEPARIN SOD (PORK) LOCK FLUSH 100 UNIT/ML IV SOLN: 500.0000 [IU] | Freq: Once | INTRAVENOUS | Status: DC

## 2017-04-10 MED ORDER — DEXTROSE 5 % IV SOLN
150.0000 mg/m2 | Freq: Once | INTRAVENOUS | Status: AC
  Administered 2017-04-10: 360 mg via INTRAVENOUS
  Filled 2017-04-10: qty 15

## 2017-04-10 MED ORDER — SODIUM CHLORIDE 0.9 % IV SOLN
Freq: Once | INTRAVENOUS | Status: AC
Start: 2017-04-10 — End: 2017-04-10
  Administered 2017-04-10: 10:00:00 via INTRAVENOUS
  Filled 2017-04-10: qty 1000

## 2017-04-10 MED ORDER — PALONOSETRON HCL INJECTION 0.25 MG/5ML
0.2500 mg | Freq: Once | INTRAVENOUS | Status: AC
Start: 2017-04-10 — End: 2017-04-10
  Administered 2017-04-10: 0.25 mg via INTRAVENOUS
  Filled 2017-04-10: qty 5

## 2017-04-10 MED ORDER — LEUCOVORIN CALCIUM INJECTION 350 MG
950.0000 mg | Freq: Once | INTRAVENOUS | Status: AC
  Administered 2017-04-10: 950 mg via INTRAVENOUS
  Filled 2017-04-10: qty 47.5

## 2017-04-10 MED ORDER — ATROPINE SULFATE 1 MG/ML IJ SOLN
0.5000 mg | Freq: Once | INTRAMUSCULAR | Status: AC | PRN
  Administered 2017-04-10: 0.5 mg via INTRAVENOUS
  Filled 2017-04-10: qty 1

## 2017-04-10 MED ORDER — SODIUM CHLORIDE 0.9 % IV SOLN
2200.0000 mg/m2 | INTRAVENOUS | Status: DC
  Administered 2017-04-10: 5250 mg via INTRAVENOUS
  Filled 2017-04-10: qty 105

## 2017-04-10 MED ORDER — SODIUM CHLORIDE 0.9% FLUSH
10.0000 mL | INTRAVENOUS | Status: DC | PRN
Start: 2017-04-10 — End: 2017-04-10
  Administered 2017-04-10: 10 mL via INTRAVENOUS
  Filled 2017-04-10: qty 10

## 2017-04-10 NOTE — Progress Notes (Signed)
[facility] NOTE  Patient Care Team: Dion Body, MD as PCP - General (Family Medicine) Clent Jacks, RN as Registered Nurse  CHIEF COMPLAINTS/PURPOSE OF CONSULTATION: RECTAL CANCER  #  Oncology History   # 2012- RECTO-SIGMOID-poorly diff STAGE III CA [s/p neo-adj chemo-RT- poor response]; APR [residual ypT3; 5/8 LN positiveDr.Smith/Dr.Pandit]  S/p FOLFOX   # SEP 2019- Pre-sacral fluid/mass- increasing ~ 5-6 cm [increased from dec 2017; also NEW retroperitoneal lymph nodes; inguinal adenopathy]; SEP 2018- left supraclavicular; mediastinal; right hilar-retroperitoneal; pelvic mass/node; bilateral inguinal adenopathy; s/p right Ingiunal LNBx - RECURRENT CA [necrotic;insuff tissue for F-One]  # OCT 8th 2018-FOLFIRI [first cycle 5FU alone]; OCT 22nd FOLFIRI  #   ------------------------------  ---  OCT-NOV 2018-Sepsis x2 sec to UTI [s/p ABx;]  # s/p RT rectal [11/28-last treatment]  # Acute renal insuff [s/p bil PCN]; hematuria- sec to bladder invol [? Contra-indication to avastin]  # LEFT LE DVT [? [facility]- Dr.Schneir]; Eliquis.   # CHF/COPD/CKD/ PN sec to [facility]  # [facility]-PDL-1/ TPS- 0% [2012-rectal specimen]; pending     Rectal cancer ([place])    HISTORY OF PRESENTING ILLNESS:  Donald Townsend 66 y.o.  male  above History of recurrent metastatic rectal cancer currently on FOLFIRI status post cycle #3 is here for follow-up.  Patient has not had any recent readmission the hospital for any infections.  He has a dry cough.  No sputum.  No fever or chills.  Denies any diarrhea. Denies any sores in the mouth.  No nausea no vomiting. Chronic mild back pain. Patient denies any pain. He has chronic mild swelling of the left lower extremity.   ROS: A complete 10 point review of system is done which is negative except mentioned above in history of present illness.   MEDICAL HISTORY:  Past Medical History:  Diagnosis Date  . A-fib ([place])    . Cancer [facility])    Colon  . CHF (congestive heart failure) ([place])   . Diabetes mellitus without complication ([place])   . Dyspnea   . Elevated lipids   . Hypertension   . PVD (peripheral vascular disease) ([place])     SURGICAL HISTORY: Past Surgical History:  Procedure Laterality Date  . COLON RESECTION     with colostomy  . COLONOSCOPY    . CYSTOSCOPY WITH STENT PLACEMENT Bilateral 01/18/2017   Procedure: CYSTOSCOPY WITH STENT PLACEMENT;  Surgeon: [name], MD;  Location: [facility] ORS;  Service: Urology;  Laterality: Bilateral;  . IR FLUORO GUIDE PORT INSERTION RIGHT  01/24/2017  . IR NEPHROSTOMY EXCHANGE LEFT  03/14/2017  . IR NEPHROSTOMY EXCHANGE RIGHT  03/14/2017  . IR NEPHROSTOMY PLACEMENT LEFT  01/19/2017  . IR NEPHROSTOMY PLACEMENT RIGHT  01/19/2017  . IVC FILTER REMOVAL N/A 08/28/2016   Procedure: IVC Filter Removal;  Surgeon: Katha Cabal, MD;  Location: [place] [facility];  Service: Cardiovascular;  Laterality: N/A;  . LOWER EXTREMITY VENOGRAPHY Left 12/18/2016   Procedure: Lower Extremity Venography;  Surgeon: Katha Cabal, MD;  Location: [place] [facility];  Service: Cardiovascular;  Laterality: Left;  . PERIPHERAL VASCULAR CATHETERIZATION Left 05/08/2016   Procedure: Lower Extremity Venography;  Surgeon: Katha Cabal, MD;  Location: [place] [facility];  Service: Cardiovascular;  Laterality: Left;    SOCIAL HISTORY: [place]; retdOccupational psychologist; currently works part time. He lives alone. No smoking occasional alcohol. Social History   Socioeconomic History  . Marital status: Divorced    Spouse name: Not  on file  . Number of children: Not on file  . Years of education: Not on file  . Highest education level: Not on file  Social Needs  . Financial resource strain: Not on file  . Food insecurity - worry: Not on file  . Food insecurity - inability: Not on file  . Transportation needs - medical: Not on file  . Transportation needs - non-medical: Not  on file  Occupational History  . Not on file  Tobacco Use  . Smoking status: Never Smoker  . Smokeless tobacco: Never Used  Substance and Sexual Activity  . Alcohol use: No  . Drug use: No  . Sexual activity: Not on file  Other Topics Concern  . Not on file  Social History Narrative  . Not on file    FAMILY HISTORY: Family History  Problem Relation Age of Onset  . Diabetes Mother   . Stroke Mother   . Prostate cancer Brother   . Cancer Sister        Breast cancer  . Kidney cancer Neg Hx   . Bladder Cancer Neg Hx     ALLERGIES:  has No Known Allergies.  MEDICATIONS:  Current Outpatient Medications  Medication Sig Dispense Refill  . acidophilus (RISAQUAD) CAPS capsule Take 1 capsule daily by mouth. 30 capsule 0  . B-D ULTRAFINE III SHORT PEN 31G X 8 MM MISC     . Continuous Blood Gluc Receiver (FREESTYLE LIBRE READER) DEVI Use 1 Units as directed. Check CBG's four times daily. Dx: E11.9    . Continuous Blood Gluc Sensor (FREESTYLE LIBRE SENSOR SYSTEM) MISC Use 1 Units as directed. Check CBG's four times daily. Dx: E11.9    . cyclobenzaprine (FEXMID) 7.5 MG tablet Take 1 tablet (7.5 mg total) 3 (three) times daily as needed by mouth for muscle spasms. 30 tablet 0  . digoxin (LANOXIN) 0.25 MG tablet Take 0.25 mg by mouth daily.     [name] ELIQUIS 5 MG TABS tablet Take 5 mg by mouth every 12 (twelve) hours.  0  . feeding supplement, GLUCERNA SHAKE, ([place]) LIQD Follow manufacturer instructions    . glimepiride (AMARYL) 4 MG tablet Take 1 tablet by mouth daily.    [name] HYDROcodone-acetaminophen (NORCO/VICODIN) 5-325 MG tablet Take 1 tablet by mouth every 6 (six) hours as needed for moderate pain. 90 tablet 0  . insulin aspart (NOVOLOG) 100 UNIT/ML FlexPen Inject 7 Units into the skin 3 (three) times daily with meals.     . Lancets 30G MISC Use 1 Units as directed. Check CBG's up to twice daily. Dx: E11.9    . LANTUS SOLOSTAR 100 UNIT/ML Solostar Pen Inject 20 Units into the  skin at bedtime.   0  . metoprolol tartrate (LOPRESSOR) 25 MG tablet Take 25 mg by mouth 2 (two) times daily.     . ondansetron (ZOFRAN) 8 MG tablet 1 pill every 8 hours for nausea/vomitting as needed 40 tablet 2  . pravastatin (PRAVACHOL) 20 MG tablet take 33m by mouth daily at bedtime    . prochlorperazine (COMPAZINE) 10 MG tablet Take 1 tablet (10 mg total) by mouth every 6 (six) hours as needed for nausea or vomiting. 40 tablet 1  . furosemide (LASIX) 40 MG tablet   0  . losartan (COZAAR) 100 MG tablet Take 100 mg by mouth daily.     No current facility-administered medications for this visit.    Facility-Administered Medications Ordered in Other Visits  Medication Dose Route Frequency  Provider Last Rate Last Dose  . sodium chloride flush (NS) 0.9 % injection 10 mL  10 mL Intracatheter PRN Cammie Sickle, MD   10 mL at 04/12/17 1003      .  PHYSICAL EXAMINATION: ECOG PERFORMANCE STATUS: 0 - Asymptomatic  Vitals:   04/10/17 0928  BP: 113/72  Pulse: 84  Resp: 20  Temp: 98.5 F (36.9 C)   Filed Weights   04/10/17 0928  Weight: 249 lb (112.9 kg)    GENERAL: Well-nourished well-developed; Alert, no distress and comfortable.  He is alone. EYES: no pallor or icterus OROPHARYNX: no thrush or ulceration; good dentition  NECK: supple, no masses felt LYMPH:  no palpable lymphadenopathy in the cervical, axillary or inguinal regions LUNGS: clear to auscultation and  No wheeze or crackles HEART/CVS: regular rate & rhythm and no murmurs; No lower extremity edema ABDOMEN: abdomen soft, non-tender and normal bowel sounds; colostomy in place. Bilateral nephrostomy tubes in place. Musculoskeletal:no cyanosis of digits and no clubbing  PSYCH: alert & oriented x 3 with fluent speech NEURO: no focal motor/sensory deficits SKIN:  no rashes or significant lesions  LABORATORY DATA:  I have reviewed the data as listed Lab Results  Component Value Date   WBC 5.6 04/10/2017   HGB  11.1 (L) 04/10/2017   HCT 32.2 (L) 04/10/2017   MCV 86.3 04/10/2017   PLT 164 04/10/2017   Recent Labs    03/14/17 1137  03/17/17 0406 03/27/17 0858 04/10/17 0855  NA 131*   < > 136 132* 132*  K 4.1   < > 4.2 4.4 3.6  CL 97*   < > 104 101 99*  CO2 21*   < > '25 22 23  ' GLUCOSE 215*   < > 208* 297* 356*  BUN 26*   < > 24* 28* 24*  CREATININE 1.75*   < > 1.58* 1.69* 1.58*  CALCIUM 9.3   < > 8.7* 9.3 8.6*  GFRNONAA 39*   < > 44* 41* 44*  GFRAA 45*   < > 51* 47* 51*  PROT 7.4  --   --  7.4 7.0  ALBUMIN 3.4*  --   --  3.2* 3.3*  AST 24  --   --  25 20  ALT 15*  --   --  18 17  ALKPHOS 71  --   --  72 73  BILITOT 1.0  --   --  0.7 0.6   < > = values in this interval not displayed.    RADIOGRAPHIC STUDIES: I have personally reviewed the radiological images as listed and agreed with the findings in the report. Dg Chest 2 View  Result Date: 03/14/2017 CLINICAL DATA:  Fever. History of colon cancer. Currently on chemoradiation. EXAM: CHEST  2 VIEW COMPARISON:  Chest x-ray dated January 31, 2017. FINDINGS: Right chest wall port catheter with tip at the cavoatrial junction, unchanged. The cardiomediastinal silhouette is normal in size. Normal pulmonary vascularity. No focal consolidation, pleural effusion, or pneumothorax. No acute osseous abnormality. IMPRESSION: No active cardiopulmonary disease. Electronically Signed   By: Titus Dubin M.D.   On: 03/14/2017 12:14   US Renal  Result Date: 03/14/2017 CLINICAL DATA:  Fever chills and nephrostomy tube EXAM: RENAL / URINARY TRACT ULTRASOUND COMPLETE COMPARISON:  PET-CT 01/22/2017 FINDINGS: Right Kidney: Length: 11.9 cm. Cortical echogenicity within normal limits. Partially visualize right nephrostomy tube without significant hydronephrosis or perinephric collection. Left Kidney: Length: 10.3 cm. Cortical echogenicity within normal limits. Partially visualized  nephrostomy tube in the lower pole. No perinephric collection Bladder: Poorly  defined with probable wall thickening. IMPRESSION: 1. Bilateral partially visualized nephrostomy tubes. No significant hydronephrosis. No perinephric fluid collections by sonography 2. Poorly visible urinary bladder. Electronically Signed   By: Donavan Foil M.D.   On: 03/14/2017 15:16   Ir Nephrostomy Exchange Left  Result Date: 03/14/2017 INDICATION: History of rectal cancer with bilateral obstructive uropathy, post bilateral ultrasound fluoroscopic guided nephrostomy catheter placement on 01/19/2017 by Dr. Kathlene Cote. Patient now presents difference department with concern for impending urosepsis neck soft, request made for fluoroscopic guided exchange of bilateral percutaneous nephrostomy catheters. EXAM: FLUOROSCOPIC GUIDED BILATERAL SIDED NEPHROSTOMY CATHETER EXCHANGE COMPARISON:  None. CONTRAST:  A total of 20 mL Isovue-300 administered was administered into both collecting systems FLUOROSCOPY TIME:  3 minutes 30 seconds (262 mGy) COMPLICATIONS: None immediate. TECHNIQUE: Informed written consent was obtained from the patient after a discussion of the risks, benefits and alternatives to treatment. Questions regarding the procedure were encouraged and answered. A timeout was performed prior to the initiation of the procedure. The bilateral flanks and external portions of existing nephrostomy catheters were prepped and draped in the usual sterile fashion. A sterile drape was applied covering the operative field. Maximum barrier sterile technique with sterile gowns and gloves were used for the procedure. A timeout was performed prior to the initiation of the procedure. A pre procedural spot fluoroscopic image was obtained. Beginning with the left-sided nephrostomy, a small amount of contrast was injected via the existing left-sided nephrostomy catheter demonstrating appropriate positioning within the renal pelvis. The existing nephrostomy catheter was cut and cannulated with a Benson wire which was coiled  within the renal pelvis. Under intermittent fluoroscopic guidance, the existing nephrostomy catheter was exchanged for a new 10.2 [place] all-purpose drainage catheter. Limited contrast injection confirmed appropriate positioning within the left renal pelvis and a post exchange fluoroscopic image was obtained. The catheter was locked, secured to the skin with an interrupted suture and reconnected to a gravity bag. The identical repeat procedure was repeated for the contralateral right-sided nephrostomy, ultimately allowing successful exchange of a new 10.2 [place] all-purpose drainage catheter with end coiled and locked within the right renal pelvis. Dressings were placed. The patient tolerated the above procedures well without immediate postprocedural complication. FINDINGS: The existing nephrostomy catheters are appropriately positioned and functioning. After successful fluoroscopic guided exchange, new bilateral 10.2 French nephrostomy catheters are coiled and locked within the respective renal pelvises. IMPRESSION: Successful fluoroscopic guided exchange of bilateral 10.2 French percutaneous nephrostomy catheters. Electronically Signed   By: Sandi Mariscal M.D.   On: 03/14/2017 16:59   Ir Nephrostomy Exchange Right  Result Date: 03/14/2017 INDICATION: History of rectal cancer with bilateral obstructive uropathy, post bilateral ultrasound fluoroscopic guided nephrostomy catheter placement on 01/19/2017 by Dr. Kathlene Cote. Patient now presents difference department with concern for impending urosepsis neck soft, request made for fluoroscopic guided exchange of bilateral percutaneous nephrostomy catheters. EXAM: FLUOROSCOPIC GUIDED BILATERAL SIDED NEPHROSTOMY CATHETER EXCHANGE COMPARISON:  None. CONTRAST:  A total of 20 mL Isovue-300 administered was administered into both collecting systems FLUOROSCOPY TIME:  3 minutes 30 seconds (035 mGy) COMPLICATIONS: None immediate. TECHNIQUE: Informed written consent was  obtained from the patient after a discussion of the risks, benefits and alternatives to treatment. Questions regarding the procedure were encouraged and answered. A timeout was performed prior to the initiation of the procedure. The bilateral flanks and external portions of existing nephrostomy catheters were prepped and draped in the usual sterile fashion.  A sterile drape was applied covering the operative field. Maximum barrier sterile technique with sterile gowns and gloves were used for the procedure. A timeout was performed prior to the initiation of the procedure. A pre procedural spot fluoroscopic image was obtained. Beginning with the left-sided nephrostomy, a small amount of contrast was injected via the existing left-sided nephrostomy catheter demonstrating appropriate positioning within the renal pelvis. The existing nephrostomy catheter was cut and cannulated with a Benson wire which was coiled within the renal pelvis. Under intermittent fluoroscopic guidance, the existing nephrostomy catheter was exchanged for a new 10.2 [place] all-purpose drainage catheter. Limited contrast injection confirmed appropriate positioning within the left renal pelvis and a post exchange fluoroscopic image was obtained. The catheter was locked, secured to the skin with an interrupted suture and reconnected to a gravity bag. The identical repeat procedure was repeated for the contralateral right-sided nephrostomy, ultimately allowing successful exchange of a new 10.2 [place] all-purpose drainage catheter with end coiled and locked within the right renal pelvis. Dressings were placed. The patient tolerated the above procedures well without immediate postprocedural complication. FINDINGS: The existing nephrostomy catheters are appropriately positioned and functioning. After successful fluoroscopic guided exchange, new bilateral 10.2 French nephrostomy catheters are coiled and locked within the respective renal pelvises.  IMPRESSION: Successful fluoroscopic guided exchange of bilateral 10.2 French percutaneous nephrostomy catheters. Electronically Signed   By: Sandi Mariscal M.D.   On: 03/14/2017 16:59   Results for JEDD, SCHUL[place] (MRN [id number]) as of 04/10/2017 09:46  Ref. Range 01/01/2017 16:21 01/16/2017 16:15 02/06/2017 08:20 02/18/2017 08:02 03/16/2017 04:37  CEA Latest Ref Range: 0.0 - 4.7 ng/mL  132.4 (H) 138.0 (H) 160.8 (H) 79.3 (H)  Prostate Specific Ag, Serum Latest Ref Range: 0.0 - 4.0 ng/mL 0.2       ASSESSMENT & PLAN:   Rectal cancer ([place]) # RECURRENT RECTAL CANCER STAGE IV- [s/p LN Bx]; currently on 5FU infusion cycle #2; tolerated well-except for recent admission to the hospital for sepsis/UTI.  However CEA improving- 79.   # will plan to proceed with FOLFIRI # 4 today [ irinotecan-150 mg/m square; 5-FU 2200 mg/m square-given recent sepsis]. Labs today reviewed;  acceptable for treatment today.   # Hematuria-likely secondary to bladder involvement by the tumor. Continue eliquis-given the extensive DVT. HOLD avastin.   # Cough- dry-likely allergies recommend Claritin.   #  CKD- secondary to bilateral hydronephrosis from the underlying pelvic mass. S/p PNC bil. Renal function improving/stabilizing with creatinine at 1.6-STABLE.   # Back pain- secondary to pelvic mass/PCN; continue hydrocodone as needed  # Acute DVT Left -continue Eliquis. No hematuria noted.   # DM-2 ; on insulin/ amaryl; On Lantus. Poorly controlled- 358; discontinue dex. Ran out of novolog; recommend re-starting keepinga log; follow up with PCP.   # follow up in 2 weeks/labs;FOLFIRI/labs/CEA;[name] Will order CT scan after 6 cycles of chemo.   Addendum: Again discussed with pathology Dr. Luana Shu regarding recent admission for foundation 1 testing.  Cc; Dr.L  All questions were answered. The patient knows to call the clinic with any problems, questions or concerns.    Cammie Sickle, MD 04/12/2017 11:18 AM

## 2017-04-10 NOTE — Assessment & Plan Note (Addendum)
#   RECURRENT RECTAL CANCER STAGE IV- [s/p LN Bx]; currently on 5FU infusion cycle #2; tolerated well-except for recent admission to the hospital for sepsis/UTI.  However CEA improving- 79.   # will plan to proceed with FOLFIRI # 4 today [ irinotecan-150 mg/m square; 5-FU 2200 mg/m square-given recent sepsis]. Labs today reviewed;  acceptable for treatment today.   # Hematuria-likely secondary to bladder involvement by the tumor. Continue eliquis-given the extensive DVT. HOLD avastin.   # Cough- dry-likely allergies recommend Claritin.   #  CKD- secondary to bilateral hydronephrosis from the underlying pelvic mass. S/p PNC bil. Renal function improving/stabilizing with creatinine at 1.6-STABLE.   # Back pain- secondary to pelvic mass/PCN; continue hydrocodone as needed  # Acute DVT Left -continue Eliquis. No hematuria noted.   # DM-2 ; on insulin/ amaryl; On Lantus. Poorly controlled- 358; discontinue dex. Ran out of novolog; recommend re-starting keepinga log; follow up with PCP.   # follow up in 2 weeks/labs;FOLFIRI/labs/CEA;[name] Will order CT scan after 6 cycles of chemo.   Addendum: Again discussed with pathology Dr. Luana Shu regarding recent admission for foundation 1 testing.  Cc; Dr.L

## 2017-04-11 ENCOUNTER — Encounter: Payer: Self-pay | Admitting: Radiation Oncology

## 2017-04-11 ENCOUNTER — Ambulatory Visit
Admission: RE | Admit: 2017-04-11 | Discharge: 2017-04-11 | Disposition: A | Discharge status: Home / Self Care | Payer: Medicare Other | Source: Ambulatory Visit | Attending: Radiation Oncology | Admitting: Radiation Oncology

## 2017-04-11 ENCOUNTER — Other Ambulatory Visit: Payer: Self-pay

## 2017-04-11 VITALS — BP 115/70 | HR 85 | Temp 97.2°F | Resp 20 | Wt 252.3 lb

## 2017-04-11 DIAGNOSIS — Z923 Personal history of irradiation: Secondary | ICD-10-CM | POA: Diagnosis not present

## 2017-04-11 DIAGNOSIS — Z9221 Personal history of antineoplastic chemotherapy: Secondary | ICD-10-CM | POA: Insufficient documentation

## 2017-04-11 DIAGNOSIS — R319 Hematuria, unspecified: Secondary | ICD-10-CM | POA: Diagnosis not present

## 2017-04-11 DIAGNOSIS — Z7901 Long term (current) use of anticoagulants: Secondary | ICD-10-CM | POA: Diagnosis not present

## 2017-04-11 DIAGNOSIS — C20 Malignant neoplasm of rectum: Secondary | ICD-10-CM | POA: Diagnosis not present

## 2017-04-11 DIAGNOSIS — Z86718 Personal history of other venous thrombosis and embolism: Secondary | ICD-10-CM | POA: Insufficient documentation

## 2017-04-11 LAB — CEA: CEA1: 93.2 ng/mL — AB (ref 0.0–4.7)

## 2017-04-11 NOTE — Progress Notes (Signed)
Radiation Oncology Follow up Note  Name: Donald Townsend   Date:   04/11/2017 MRN:  [id number] DOB: 05-11-1950    This 66 y.o. male presents to the clinic today for two-week follow-up status post palliative radiation therapy to his pelvis for recurrent rectal cancer with hematuria.[name]  REFERRING PROVIDER: Dion Body, MD  HPI: Patient is a 66 year old male with recurrent rectal cancer. With tumor impinging and involving the lateral wall. PET CT demonstrated a significant burn the pelvis and we treated with 2000 cGy over 2 weeks to prevent further hematuria. He is seen today in routine follow-up is doing well he says most of the hematuria has stopped. He's having no pelvic pain at this time. Patient does have a history of new adjuvant chemoradiation prior surgical resection back in 2012. He is currently onFOLFIR. Patient is also currently oneliquis for extensive DVT. This may have something to do with his continued slight hematuria.  COMPLICATIONS OF TREATMENT: none  FOLLOW UP COMPLIANCE: keeps appointments   PHYSICAL EXAM:  BP 115/70   Pulse 85   Temp (!) 97.2 F (36.2 C)   Resp 20   Wt 252 lb 5.1 oz (114.4 kg)   BMI 36.20 kg/m  Well-developed well-nourished patient in NAD. HEENT reveals PERLA, EOMI, discs not visualized.  Oral cavity is clear. No oral mucosal lesions are identified. Neck is clear without evidence of cervical or supraclavicular adenopathy. Lungs are clear to A&P. Cardiac examination is essentially unremarkable with regular rate and rhythm without murmur rub or thrill. Abdomen is benign with no organomegaly or masses noted. Motor sensory and DTR levels are equal and symmetric in the upper and lower extremities. Cranial nerves II through XII are grossly intact. Proprioception is intact. No peripheral adenopathy or edema is identified. No motor or sensory levels are noted. Crude visual fields are within normal range.  RADIOLOGY RESULTS: No current films for  review  PLAN: At the present time we'll hold on her current dose of radiation 2000 cGy. There is room for another 2000 over 2 weeks should he develop more symptoms although at this time I would like to just to keep did continue to observe. He has had significant prior radiation in a neoadjuvant setting back in 2012. Patient is comfortable with my decision he continues on chemotherapy protocol with Dr. Jacinto Reap. I've asked to see him back in 2-3 months for follow-up. Will reevaluate him sooner should any significant problems develop.  I would like to take this opportunity to thank you for allowing me to participate in the care of your patient.Noreene Filbert, MD

## 2017-04-12 ENCOUNTER — Inpatient Hospital Stay: Payer: Medicare Other

## 2017-04-12 VITALS — BP 118/72 | HR 83 | Temp 97.9°F | Resp 18

## 2017-04-12 DIAGNOSIS — C20 Malignant neoplasm of rectum: Secondary | ICD-10-CM

## 2017-04-12 DIAGNOSIS — R319 Hematuria, unspecified: Secondary | ICD-10-CM | POA: Diagnosis not present

## 2017-04-12 DIAGNOSIS — Z5111 Encounter for antineoplastic chemotherapy: Secondary | ICD-10-CM | POA: Diagnosis not present

## 2017-04-12 DIAGNOSIS — C778 Secondary and unspecified malignant neoplasm of lymph nodes of multiple regions: Secondary | ICD-10-CM | POA: Diagnosis not present

## 2017-04-12 DIAGNOSIS — N136 Pyonephrosis: Secondary | ICD-10-CM | POA: Diagnosis not present

## 2017-04-12 DIAGNOSIS — R1900 Intra-abdominal and pelvic swelling, mass and lump, unspecified site: Secondary | ICD-10-CM | POA: Diagnosis not present

## 2017-04-12 MED ORDER — SODIUM CHLORIDE 0.9% FLUSH
10.0000 mL | INTRAVENOUS | Status: DC | PRN
  Administered 2017-04-12: 10 mL
  Filled 2017-04-12: qty 10

## 2017-04-12 MED ORDER — HEPARIN SOD (PORK) LOCK FLUSH 100 UNIT/ML IV SOLN
500.0000 [IU] | Freq: Once | INTRAVENOUS | Status: AC | PRN
  Administered 2017-04-12: 500 [IU]
  Filled 2017-04-12: qty 5

## 2017-04-15 ENCOUNTER — Encounter: Payer: Self-pay | Admitting: Radiology

## 2017-04-18 ENCOUNTER — Telehealth: Payer: Self-pay | Admitting: *Deleted

## 2017-04-18 NOTE — Telephone Encounter (Signed)
Dr. Rogue Bussing Donald Townsend in [facility] called-1500-patient's pathology submitted to foundation one. Specimen is insufficient for testing.

## 2017-04-24 ENCOUNTER — Inpatient Hospital Stay (HOSPITAL_BASED_OUTPATIENT_CLINIC_OR_DEPARTMENT_OTHER): Payer: Medicare Other | Admitting: Internal Medicine

## 2017-04-24 ENCOUNTER — Inpatient Hospital Stay: Payer: Medicare Other

## 2017-04-24 ENCOUNTER — Encounter: Payer: Self-pay | Admitting: Internal Medicine

## 2017-04-24 VITALS — BP 115/72 | HR 87 | Resp 18

## 2017-04-24 VITALS — BP 122/76 | HR 101 | Temp 97.7°F | Resp 16 | Wt 251.4 lb

## 2017-04-24 DIAGNOSIS — G629 Polyneuropathy, unspecified: Secondary | ICD-10-CM | POA: Diagnosis not present

## 2017-04-24 DIAGNOSIS — I509 Heart failure, unspecified: Secondary | ICD-10-CM

## 2017-04-24 DIAGNOSIS — C20 Malignant neoplasm of rectum: Secondary | ICD-10-CM

## 2017-04-24 DIAGNOSIS — J449 Chronic obstructive pulmonary disease, unspecified: Secondary | ICD-10-CM

## 2017-04-24 DIAGNOSIS — E119 Type 2 diabetes mellitus without complications: Secondary | ICD-10-CM | POA: Diagnosis not present

## 2017-04-24 DIAGNOSIS — Z923 Personal history of irradiation: Secondary | ICD-10-CM

## 2017-04-24 DIAGNOSIS — Z5111 Encounter for antineoplastic chemotherapy: Secondary | ICD-10-CM | POA: Diagnosis not present

## 2017-04-24 DIAGNOSIS — Z803 Family history of malignant neoplasm of breast: Secondary | ICD-10-CM

## 2017-04-24 DIAGNOSIS — R1900 Intra-abdominal and pelvic swelling, mass and lump, unspecified site: Secondary | ICD-10-CM | POA: Diagnosis not present

## 2017-04-24 DIAGNOSIS — Z8042 Family history of malignant neoplasm of prostate: Secondary | ICD-10-CM

## 2017-04-24 DIAGNOSIS — Z794 Long term (current) use of insulin: Secondary | ICD-10-CM

## 2017-04-24 DIAGNOSIS — C778 Secondary and unspecified malignant neoplasm of lymph nodes of multiple regions: Secondary | ICD-10-CM

## 2017-04-24 DIAGNOSIS — R319 Hematuria, unspecified: Secondary | ICD-10-CM

## 2017-04-24 DIAGNOSIS — I129 Hypertensive chronic kidney disease with stage 1 through stage 4 chronic kidney disease, or unspecified chronic kidney disease: Secondary | ICD-10-CM | POA: Diagnosis not present

## 2017-04-24 DIAGNOSIS — Z87440 Personal history of urinary (tract) infections: Secondary | ICD-10-CM

## 2017-04-24 DIAGNOSIS — N136 Pyonephrosis: Secondary | ICD-10-CM

## 2017-04-24 DIAGNOSIS — Z7901 Long term (current) use of anticoagulants: Secondary | ICD-10-CM

## 2017-04-24 DIAGNOSIS — R05 Cough: Secondary | ICD-10-CM

## 2017-04-24 DIAGNOSIS — Z86718 Personal history of other venous thrombosis and embolism: Secondary | ICD-10-CM

## 2017-04-24 DIAGNOSIS — N189 Chronic kidney disease, unspecified: Secondary | ICD-10-CM

## 2017-04-24 DIAGNOSIS — M7989 Other specified soft tissue disorders: Secondary | ICD-10-CM | POA: Diagnosis not present

## 2017-04-24 DIAGNOSIS — Z79899 Other long term (current) drug therapy: Secondary | ICD-10-CM

## 2017-04-24 LAB — CBC WITH DIFFERENTIAL/PLATELET
Basophils Absolute: 0.1 10*3/uL (ref 0–0.1)
Basophils Relative: 1 %
EOS PCT: 5 %
Eosinophils Absolute: 0.3 10*3/uL (ref 0–0.7)
HCT: 32.8 % — ABNORMAL LOW (ref 40.0–52.0)
Hemoglobin: 11.2 g/dL — ABNORMAL LOW (ref 13.0–18.0)
LYMPHS ABS: 0.6 10*3/uL — AB (ref 1.0–3.6)
LYMPHS PCT: 11 %
MCH: 29.8 pg (ref 26.0–34.0)
MCHC: 34.2 g/dL (ref 32.0–36.0)
MCV: 87.2 fL (ref 80.0–100.0)
Monocytes Absolute: 0.7 10*3/uL (ref 0.2–1.0)
Monocytes Relative: 11 %
Neutro Abs: 4.1 10*3/uL (ref 1.4–6.5)
Neutrophils Relative %: 72 %
PLATELETS: 262 10*3/uL (ref 150–440)
RBC: 3.77 MIL/uL — AB (ref 4.40–5.90)
RDW: 17 % — ABNORMAL HIGH (ref 11.5–14.5)
WBC: 5.8 10*3/uL (ref 3.8–10.6)

## 2017-04-24 LAB — COMPREHENSIVE METABOLIC PANEL
ALK PHOS: 64 U/L (ref 38–126)
ALT: 16 U/L — AB (ref 17–63)
AST: 23 U/L (ref 15–41)
Albumin: 3.4 g/dL — ABNORMAL LOW (ref 3.5–5.0)
Anion gap: 9 (ref 5–15)
BUN: 24 mg/dL — ABNORMAL HIGH (ref 6–20)
CALCIUM: 9 mg/dL (ref 8.9–10.3)
CO2: 24 mmol/L (ref 22–32)
CREATININE: 1.4 mg/dL — AB (ref 0.61–1.24)
Chloride: 104 mmol/L (ref 101–111)
GFR, EST AFRICAN AMERICAN: 59 mL/min — AB (ref >60)
GFR, EST NON AFRICAN AMERICAN: 51 mL/min — AB (ref >60)
Glucose, Bld: 222 mg/dL — ABNORMAL HIGH (ref 65–99)
Potassium: 3.6 mmol/L (ref 3.5–5.1)
Sodium: 137 mmol/L (ref 135–145)
Total Bilirubin: 0.7 mg/dL (ref 0.3–1.2)
Total Protein: 7 g/dL (ref 6.5–8.1)

## 2017-04-24 MED ORDER — IRINOTECAN HCL CHEMO INJECTION 100 MG/5ML
150.0000 mg/m2 | Freq: Once | INTRAVENOUS | Status: AC
  Administered 2017-04-24: 360 mg via INTRAVENOUS
  Filled 2017-04-24: qty 15

## 2017-04-24 MED ORDER — SODIUM CHLORIDE 0.9 % IV SOLN
Freq: Once | INTRAVENOUS | Status: AC
  Administered 2017-04-24: 10:00:00 via INTRAVENOUS
  Filled 2017-04-24: qty 1000

## 2017-04-24 MED ORDER — LEUCOVORIN CALCIUM INJECTION 350 MG
950.0000 mg | Freq: Once | INTRAVENOUS | Status: AC
  Administered 2017-04-24: 950 mg via INTRAVENOUS
  Filled 2017-04-24: qty 47.5

## 2017-04-24 MED ORDER — PALONOSETRON HCL INJECTION 0.25 MG/5ML
0.2500 mg | Freq: Once | INTRAVENOUS | Status: AC
  Administered 2017-04-24: 0.25 mg via INTRAVENOUS
  Filled 2017-04-24: qty 5

## 2017-04-24 MED ORDER — ATROPINE SULFATE 1 MG/ML IJ SOLN
0.5000 mg | Freq: Once | INTRAMUSCULAR | Status: AC | PRN
  Administered 2017-04-24: 0.5 mg via INTRAVENOUS
  Filled 2017-04-24: qty 1

## 2017-04-24 MED ORDER — SODIUM CHLORIDE 0.9 % IV SOLN
2200.0000 mg/m2 | INTRAVENOUS | Status: DC
  Administered 2017-04-24: 5250 mg via INTRAVENOUS
  Filled 2017-04-24: qty 105

## 2017-04-24 NOTE — Progress Notes (Signed)
[place] NOTE  Patient Care Team: Dion Body, MD as PCP - General (Family Medicine) Clent Jacks, RN as Registered Nurse  CHIEF COMPLAINTS/PURPOSE OF CONSULTATION: RECTAL CANCER  #  Oncology History   # 2012- RECTO-SIGMOID-poorly diff STAGE III CA [s/p neo-adj chemo-RT- poor response]; APR [residual ypT3; 5/8 LN positiveDr.Smith/Dr.Pandit]  S/p FOLFOX   # SEP 2019- Pre-sacral fluid/mass- increasing ~ 5-6 cm [increased from dec 2017; also NEW retroperitoneal lymph nodes; inguinal adenopathy]; SEP 2018- left supraclavicular; mediastinal; right hilar-retroperitoneal; pelvic mass/node; bilateral inguinal adenopathy; s/p right Ingiunal LNBx - RECURRENT CA [necrotic;insuff tissue for F-One]  # OCT 8th 2018-FOLFIRI [first cycle 5FU alone]; OCT 22nd FOLFIRI  #   ------------------------------  ---  OCT-NOV 2018-Sepsis x2 sec to UTI [s/p ABx;]  # s/p RT rectal [11/28-last treatment]  # Acute renal insuff [s/p bil PCN]; hematuria- sec to bladder invol [? Contra-indication to avastin]  # LEFT LE DVT [? [facility]- Dr.Schneir]; Eliquis.   # CHF/COPD/CKD/ PN sec to [facility]  # [facility] One-PDL-1/ TPS- 0% [2012-rectal specimen]; NOT suff; 12/26- order Omniseq     Rectal cancer ([place])    HISTORY OF PRESENTING ILLNESS:  Donald Townsend 66 y.o.  male  above History of recurrent metastatic rectal cancer currently on FOLFIRI status post cycle #4 is here for follow-up.  Patient has not had any recent readmission the hospital for any infections.  Patient continues to have mild dry cough-noted worse.  As per patient's daughter-mild swelling of the left lower extremity compared to right; dependent.  No significant pain.  Prior history of left lower extremity DVT.  Currently on Eliquis.  As per patient this is not very significant change.  Denies any diarrhea. Denies any sores in the mouth.  No nausea no vomiting. Chronic mild back pain. Patient denies any  pain.   ROS: A complete 10 point review of system is done which is negative except mentioned above in history of present illness.   MEDICAL HISTORY:  Past Medical History:  Diagnosis Date  . A-fib ([place] [facility] [place])   . Cancer [facility])    Colon  . CHF (congestive heart failure) ([place])   . Diabetes mellitus without complication ([place])   . Dyspnea   . Elevated lipids   . Hypertension   . PVD (peripheral vascular disease) ([place])     SURGICAL HISTORY: Past Surgical History:  Procedure Laterality Date  . COLON RESECTION     with colostomy  . COLONOSCOPY    . CYSTOSCOPY WITH STENT PLACEMENT Bilateral 01/18/2017   Procedure: CYSTOSCOPY WITH STENT PLACEMENT;  Surgeon: [name], MD;  Location: [facility] ORS;  Service: Urology;  Laterality: Bilateral;  . IR FLUORO GUIDE PORT INSERTION RIGHT  01/24/2017  . IR NEPHROSTOMY EXCHANGE LEFT  03/14/2017  . IR NEPHROSTOMY EXCHANGE RIGHT  03/14/2017  . IR NEPHROSTOMY PLACEMENT LEFT  01/19/2017  . IR NEPHROSTOMY PLACEMENT RIGHT  01/19/2017  . IVC FILTER REMOVAL N/A 08/28/2016   Procedure: IVC Filter Removal;  Surgeon: Katha Cabal, MD;  Location: [place] [facility];  Service: Cardiovascular;  Laterality: N/A;  . LOWER EXTREMITY VENOGRAPHY Left 12/18/2016   Procedure: Lower Extremity Venography;  Surgeon: Katha Cabal, MD;  Location: [place] [facility];  Service: Cardiovascular;  Laterality: Left;  . PERIPHERAL VASCULAR CATHETERIZATION Left 05/08/2016   Procedure: Lower Extremity Venography;  Surgeon: Katha Cabal, MD;  Location: [place] [facility];  Service: Cardiovascular;  Laterality: Left;    SOCIAL HISTORY: ;  retdOccupational psychologist; currently works part time. He lives alone. No smoking occasional alcohol. Social History   Socioeconomic History  . Marital status: Divorced    Spouse name: Not on file  . Number of children: Not on file  . Years of education: Not on file  . Highest education level: Not on file  Social Needs  .  Financial resource strain: Not on file  . Food insecurity - worry: Not on file  . Food insecurity - inability: Not on file  . Transportation needs - medical: Not on file  . Transportation needs - non-medical: Not on file  Occupational History  . Not on file  Tobacco Use  . Smoking status: Never Smoker  . Smokeless tobacco: Never Used  Substance and Sexual Activity  . Alcohol use: No  . Drug use: No  . Sexual activity: Not on file  Other Topics Concern  . Not on file  Social History Narrative  . Not on file    FAMILY HISTORY: Family History  Problem Relation Age of Onset  . Diabetes Mother   . Stroke Mother   . Prostate cancer Brother   . Cancer Sister        Breast cancer  . Kidney cancer Neg Hx   . Bladder Cancer Neg Hx     ALLERGIES:  has No Known Allergies.  MEDICATIONS:  Current Outpatient Medications  Medication Sig Dispense Refill  . acidophilus (RISAQUAD) CAPS capsule Take 1 capsule daily by mouth. 30 capsule 0  . B-D ULTRAFINE III SHORT PEN 31G X 8 MM MISC     . Continuous Blood Gluc Receiver (FREESTYLE LIBRE READER) DEVI Use 1 Units as directed. Check CBG's four times daily. Dx: E11.9    . Continuous Blood Gluc Sensor (FREESTYLE LIBRE SENSOR SYSTEM) MISC Use 1 Units as directed. Check CBG's four times daily. Dx: E11.9    . cyclobenzaprine (FEXMID) 7.5 MG tablet Take 1 tablet (7.5 mg total) 3 (three) times daily as needed by mouth for muscle spasms. 30 tablet 0  . digoxin (LANOXIN) 0.25 MG tablet Take 0.25 mg by mouth daily.     [name] ELIQUIS 5 MG TABS tablet Take 5 mg by mouth every 12 (twelve) hours.  0  . feeding supplement, GLUCERNA SHAKE, ([place]) LIQD Follow manufacturer instructions    . furosemide (LASIX) 40 MG tablet   0  . glimepiride (AMARYL) 4 MG tablet Take 1 tablet by mouth daily.    [name] HYDROcodone-acetaminophen (NORCO/VICODIN) 5-325 MG tablet Take 1 tablet by mouth every 6 (six) hours as needed for moderate pain. 90 tablet 0  . insulin aspart  (NOVOLOG) 100 UNIT/ML FlexPen Inject 7 Units into the skin 3 (three) times daily with meals.     . Lancets 30G MISC Use 1 Units as directed. Check CBG's up to twice daily. Dx: E11.9    . LANTUS SOLOSTAR 100 UNIT/ML Solostar Pen Inject 20 Units into the skin at bedtime.   0  . losartan (COZAAR) 100 MG tablet Take 100 mg by mouth daily.    . metoprolol tartrate (LOPRESSOR) 25 MG tablet Take 25 mg by mouth 2 (two) times daily.     . ondansetron (ZOFRAN) 8 MG tablet 1 pill every 8 hours for nausea/vomitting as needed 40 tablet 2  . pravastatin (PRAVACHOL) 20 MG tablet take 83m by mouth daily at bedtime    . prochlorperazine (COMPAZINE) 10 MG tablet Take 1 tablet (10 mg total) by mouth every 6 (six) hours as needed  for nausea or vomiting. 40 tablet 1   No current facility-administered medications for this visit.       [name]  PHYSICAL EXAMINATION: ECOG PERFORMANCE STATUS: 0 - Asymptomatic  Vitals:   04/24/17 0845  BP: 122/76  Pulse: (!) 101  Resp: 16  Temp: 97.7 F (36.5 C)   Filed Weights   04/24/17 0845  Weight: 251 lb 6.4 oz (114 kg)    GENERAL: Well-nourished well-developed; Alert, no distress and comfortable.  He is alone. EYES: no pallor or icterus OROPHARYNX: no thrush or ulceration; good dentition  NECK: supple, no masses felt LYMPH:  no palpable lymphadenopathy in the cervical, axillary or inguinal regions LUNGS: clear to auscultation and  No wheeze or crackles HEART/CVS: regular rate & rhythm and no murmurs; No lower extremity edema ABDOMEN: abdomen soft, non-tender and normal bowel sounds; colostomy in place. Bilateral nephrostomy tubes in place. Musculoskeletal:no cyanosis of digits and no clubbing  PSYCH: alert & oriented x 3 with fluent speech NEURO: no focal motor/sensory deficits SKIN:  no rashes or significant lesions  LABORATORY DATA:  I have reviewed the data as listed Lab Results  Component Value Date   WBC 5.8 04/24/2017   HGB 11.2 (L) 04/24/2017   HCT  32.8 (L) 04/24/2017   MCV 87.2 04/24/2017   PLT 262 04/24/2017   Recent Labs    03/27/17 0858 04/10/17 0855 04/24/17 0827  NA 132* 132* 137  K 4.4 3.6 3.6  CL 101 99* 104  CO2 '22 23 24  ' GLUCOSE 297* 356* 222*  BUN 28* 24* 24*  CREATININE 1.69* 1.58* 1.40*  CALCIUM 9.3 8.6* 9.0  GFRNONAA 41* 44* 51*  GFRAA 47* 51* 59*  PROT 7.4 7.0 7.0  ALBUMIN 3.2* 3.3* 3.4*  AST '25 20 23  ' ALT 18 17 16*  ALKPHOS 72 73 64  BILITOT 0.7 0.6 0.7    RADIOGRAPHIC STUDIES: I have personally reviewed the radiological images as listed and agreed with the findings in the report. No results found. Results for HENCE, DERRICK (MRN [id number]) as of 04/10/2017 09:46  Ref. Range 01/01/2017 16:21 01/16/2017 16:15 02/06/2017 08:20 02/18/2017 08:02 03/16/2017 04:37  CEA Latest Ref Range: 0.0 - 4.7 ng/mL  132.4 (H) 138.0 (H) 160.8 (H) 79.3 (H)  Prostate Specific Ag, Serum Latest Ref Range: 0.0 - 4.0 ng/mL 0.2       ASSESSMENT & PLAN:   Rectal cancer (H[facility]) # RECURRENT RECTAL CANCER STAGE IV- [s/p LN Bx]; currently on 5FU infusion-IRI cycle # 4; tolerated well-except for recent admission to the hospital for sepsis/UTI.  However CEA improving- 79; but recent CEA- 90.   # will plan to proceed with FOLFIRI # 5 today [ irinotecan-150 mg/m square; 5-FU 2200 mg/m square-given recent sepsis]. Labs today reviewed;  acceptable for treatment today. Will get CT scan prior to next cycle.   # Hx of Hematuria-likely secondary to bladder involvement by the tumor; Improved. Continue eliquis-given the extensive DVT. HOLD avastin.   #  CKD- secondary to bilateral hydronephrosis from the underlying pelvic mass. S/p PNC bil. Renal function improving/stabilizing with creatinine at 1.4-STABLE.   # Back pain- secondary to pelvic mass/PCN; continue hydrocodone as needed  # Left LE swollen compared to right- Hx of DVT-chronic; on Eliquis-not significantly worse.  First patient will let us know we will get a repeat Dopplers.  #  DM-2 ; on insulin/ amaryl; On Lantus. Better controlled.  # follow up in 2 weeks/labs;FOLFIRI/labs/CEA;. ordered for omniseq; left message for Dr.Baker. CT  scans prior.   All questions were answered. The patient knows to call the clinic with any problems, questions or concerns.    Cammie Sickle, MD 04/26/2017 6:21 AM

## 2017-04-24 NOTE — Assessment & Plan Note (Addendum)
#   RECURRENT RECTAL CANCER STAGE IV- [s/p LN Bx]; currently on 5FU infusion-IRI cycle # 4; tolerated well-except for recent admission to the hospital for sepsis/UTI.  However CEA improving- 79; but recent CEA- 90.   # will plan to proceed with FOLFIRI # 5 today [ irinotecan-150 mg/m square; 5-FU 2200 mg/m square-given recent sepsis]. Labs today reviewed;  acceptable for treatment today. Will get CT scan prior to next cycle.   # Hx of Hematuria-likely secondary to bladder involvement by the tumor; Improved. Continue eliquis-given the extensive DVT. HOLD avastin.   #  CKD- secondary to bilateral hydronephrosis from the underlying pelvic mass. S/p PNC bil. Renal function improving/stabilizing with creatinine at 1.4-STABLE.   # Back pain- secondary to pelvic mass/PCN; continue hydrocodone as needed  # Left LE swollen compared to right- Hx of DVT-chronic; on Eliquis-not significantly worse.  First patient will let us know we will get a repeat Dopplers.  # DM-2 ; on insulin/ amaryl; On Lantus. Better controlled.  # follow up in 2 weeks/labs;FOLFIRI/labs/CEA;. ordered for omniseq; left message for Dr.Baker. CT scans prior.

## 2017-04-25 LAB — CEA: CEA: 53.4 ng/mL — ABNORMAL HIGH (ref 0.0–4.7)

## 2017-04-26 ENCOUNTER — Inpatient Hospital Stay: Payer: Medicare Other

## 2017-04-26 VITALS — BP 120/76 | HR 88 | Temp 98.0°F | Resp 18

## 2017-04-26 DIAGNOSIS — C20 Malignant neoplasm of rectum: Secondary | ICD-10-CM

## 2017-04-26 DIAGNOSIS — Z5111 Encounter for antineoplastic chemotherapy: Secondary | ICD-10-CM | POA: Diagnosis not present

## 2017-04-26 DIAGNOSIS — N136 Pyonephrosis: Secondary | ICD-10-CM | POA: Diagnosis not present

## 2017-04-26 DIAGNOSIS — C778 Secondary and unspecified malignant neoplasm of lymph nodes of multiple regions: Secondary | ICD-10-CM | POA: Diagnosis not present

## 2017-04-26 DIAGNOSIS — R1900 Intra-abdominal and pelvic swelling, mass and lump, unspecified site: Secondary | ICD-10-CM | POA: Diagnosis not present

## 2017-04-26 DIAGNOSIS — R319 Hematuria, unspecified: Secondary | ICD-10-CM | POA: Diagnosis not present

## 2017-04-26 MED ORDER — HEPARIN SOD (PORK) LOCK FLUSH 100 UNIT/ML IV SOLN
500.0000 [IU] | Freq: Once | INTRAVENOUS | Status: AC | PRN
  Administered 2017-04-26: 500 [IU]
  Filled 2017-04-26: qty 5

## 2017-04-26 MED ORDER — SODIUM CHLORIDE 0.9% FLUSH
10.0000 mL | INTRAVENOUS | Status: DC | PRN
  Administered 2017-04-26: 10 mL
  Filled 2017-04-26: qty 10

## 2017-04-28 ENCOUNTER — Encounter: Payer: Self-pay | Admitting: Urology

## 2017-05-01 ENCOUNTER — Other Ambulatory Visit: Payer: Self-pay

## 2017-05-01 ENCOUNTER — Encounter: Payer: Self-pay | Admitting: Urology

## 2017-05-01 MED ORDER — CIPROFLOXACIN HCL 500 MG PO TABS: 500.0000 mg | ORAL_TABLET | Freq: Two times a day (BID) | ORAL | 0 refills | Status: AC

## 2017-05-01 NOTE — Progress Notes (Signed)
cipro

## 2017-05-02 ENCOUNTER — Encounter: Payer: Self-pay | Admitting: Urology

## 2017-05-03 ENCOUNTER — Ambulatory Visit
Admission: RE | Admit: 2017-05-03 | Discharge: 2017-05-03 | Disposition: A | Discharge status: Home / Self Care | Payer: Medicare Other | Source: Ambulatory Visit | Attending: Urology | Admitting: Urology

## 2017-05-03 ENCOUNTER — Encounter: Payer: Self-pay | Admitting: Interventional Radiology

## 2017-05-03 DIAGNOSIS — N1330 Unspecified hydronephrosis: Secondary | ICD-10-CM | POA: Diagnosis not present

## 2017-05-03 DIAGNOSIS — Z436 Encounter for attention to other artificial openings of urinary tract: Secondary | ICD-10-CM | POA: Insufficient documentation

## 2017-05-03 HISTORY — PX: IR NEPHROSTOMY EXCHANGE RIGHT: IMG6070

## 2017-05-03 HISTORY — PX: IR NEPHROSTOMY EXCHANGE LEFT: IMG6069

## 2017-05-03 MED ORDER — LIDOCAINE HCL (PF) 1 % IJ SOLN
30.0000 mL | Freq: Once | INTRAMUSCULAR | Status: DC
  Filled 2017-05-03: qty 30

## 2017-05-03 MED ORDER — IOPAMIDOL (ISOVUE-300) INJECTION 61%
30.0000 mL | Freq: Once | INTRAVENOUS | Status: AC | PRN
  Administered 2017-05-03: 08:00:00 10 mL

## 2017-05-03 MED ORDER — LIDOCAINE HCL (PF) 1 % IJ SOLN
INTRAMUSCULAR | Status: AC
  Filled 2017-05-03: qty 30

## 2017-05-03 NOTE — Procedures (Signed)
Pre Procedure Dx: Hydronephrosis Post Procedural Dx: Same  Successful [place] and fluoroscopic guided placement of a bilateral PCN with end coiled and locked within the bilateral renal pelvis. Both PCNs connected to gravity bags.  EBL: None  Complications: None immediate.  Ronny Bacon, MD Pager #: [phone]

## 2017-05-06 ENCOUNTER — Ambulatory Visit: Payer: Medicare Other

## 2017-05-06 ENCOUNTER — Ambulatory Visit
Admission: RE | Admit: 2017-05-06 | Discharge: 2017-05-06 | Disposition: A | Discharge status: Home / Self Care | Payer: Medicare Other | Source: Ambulatory Visit | Attending: Internal Medicine | Admitting: Internal Medicine

## 2017-05-06 ENCOUNTER — Other Ambulatory Visit: Payer: Self-pay | Admitting: General Surgery

## 2017-05-06 DIAGNOSIS — C20 Malignant neoplasm of rectum: Secondary | ICD-10-CM | POA: Diagnosis not present

## 2017-05-06 DIAGNOSIS — I2510 Atherosclerotic heart disease of native coronary artery without angina pectoris: Secondary | ICD-10-CM | POA: Diagnosis not present

## 2017-05-06 DIAGNOSIS — I700 Atherosclerosis of aorta: Secondary | ICD-10-CM | POA: Diagnosis not present

## 2017-05-06 DIAGNOSIS — Z936 Other artificial openings of urinary tract status: Secondary | ICD-10-CM | POA: Diagnosis not present

## 2017-05-06 DIAGNOSIS — N329 Bladder disorder, unspecified: Secondary | ICD-10-CM | POA: Diagnosis not present

## 2017-05-06 DIAGNOSIS — C3490 Malignant neoplasm of unspecified part of unspecified bronchus or lung: Secondary | ICD-10-CM | POA: Diagnosis not present

## 2017-05-06 DIAGNOSIS — R591 Generalized enlarged lymph nodes: Secondary | ICD-10-CM | POA: Insufficient documentation

## 2017-05-06 DIAGNOSIS — C772 Secondary and unspecified malignant neoplasm of intra-abdominal lymph nodes: Secondary | ICD-10-CM | POA: Diagnosis not present

## 2017-05-07 DIAGNOSIS — I10 Essential (primary) hypertension: Secondary | ICD-10-CM | POA: Diagnosis not present

## 2017-05-07 DIAGNOSIS — I82422 Acute embolism and thrombosis of left iliac vein: Secondary | ICD-10-CM | POA: Diagnosis not present

## 2017-05-07 DIAGNOSIS — I871 Compression of vein: Secondary | ICD-10-CM | POA: Diagnosis not present

## 2017-05-07 DIAGNOSIS — I481 Persistent atrial fibrillation: Secondary | ICD-10-CM | POA: Diagnosis not present

## 2017-05-08 ENCOUNTER — Inpatient Hospital Stay: Payer: Medicare Other | Attending: Internal Medicine

## 2017-05-08 ENCOUNTER — Inpatient Hospital Stay: Payer: Medicare Other

## 2017-05-08 ENCOUNTER — Inpatient Hospital Stay (HOSPITAL_BASED_OUTPATIENT_CLINIC_OR_DEPARTMENT_OTHER): Payer: Medicare Other | Admitting: Internal Medicine

## 2017-05-08 ENCOUNTER — Encounter: Payer: Self-pay | Admitting: Internal Medicine

## 2017-05-08 VITALS — BP 119/69 | HR 90 | Temp 97.9°F | Resp 16 | Wt 251.8 lb

## 2017-05-08 DIAGNOSIS — Z86718 Personal history of other venous thrombosis and embolism: Secondary | ICD-10-CM | POA: Insufficient documentation

## 2017-05-08 DIAGNOSIS — R319 Hematuria, unspecified: Secondary | ICD-10-CM

## 2017-05-08 DIAGNOSIS — Z923 Personal history of irradiation: Secondary | ICD-10-CM | POA: Insufficient documentation

## 2017-05-08 DIAGNOSIS — M549 Dorsalgia, unspecified: Secondary | ICD-10-CM

## 2017-05-08 DIAGNOSIS — Z8042 Family history of malignant neoplasm of prostate: Secondary | ICD-10-CM

## 2017-05-08 DIAGNOSIS — C20 Malignant neoplasm of rectum: Secondary | ICD-10-CM | POA: Insufficient documentation

## 2017-05-08 DIAGNOSIS — N189 Chronic kidney disease, unspecified: Secondary | ICD-10-CM | POA: Diagnosis not present

## 2017-05-08 DIAGNOSIS — G893 Neoplasm related pain (acute) (chronic): Secondary | ICD-10-CM | POA: Insufficient documentation

## 2017-05-08 DIAGNOSIS — Z5111 Encounter for antineoplastic chemotherapy: Secondary | ICD-10-CM | POA: Insufficient documentation

## 2017-05-08 DIAGNOSIS — Z803 Family history of malignant neoplasm of breast: Secondary | ICD-10-CM

## 2017-05-08 DIAGNOSIS — Z7901 Long term (current) use of anticoagulants: Secondary | ICD-10-CM

## 2017-05-08 LAB — CBC WITH DIFFERENTIAL/PLATELET
Basophils Absolute: 0 10*3/uL (ref 0–0.1)
Basophils Relative: 1 %
Eosinophils Absolute: 0.2 10*3/uL (ref 0–0.7)
Eosinophils Relative: 3 %
HCT: 33.4 % — ABNORMAL LOW (ref 40.0–52.0)
HEMOGLOBIN: 11.4 g/dL — AB (ref 13.0–18.0)
LYMPHS ABS: 0.6 10*3/uL — AB (ref 1.0–3.6)
Lymphocytes Relative: 10 %
MCH: 30 pg (ref 26.0–34.0)
MCHC: 34.2 g/dL (ref 32.0–36.0)
MCV: 87.8 fL (ref 80.0–100.0)
Monocytes Absolute: 0.7 10*3/uL (ref 0.2–1.0)
Monocytes Relative: 11 %
NEUTROS PCT: 75 %
Neutro Abs: 4.9 10*3/uL (ref 1.4–6.5)
Platelets: 224 10*3/uL (ref 150–440)
RBC: 3.8 MIL/uL — AB (ref 4.40–5.90)
RDW: 16.9 % — ABNORMAL HIGH (ref 11.5–14.5)
WBC: 6.5 10*3/uL (ref 3.8–10.6)

## 2017-05-08 LAB — COMPREHENSIVE METABOLIC PANEL
ALK PHOS: 73 U/L (ref 38–126)
ALT: 16 U/L — AB (ref 17–63)
AST: 17 U/L (ref 15–41)
Albumin: 3.3 g/dL — ABNORMAL LOW (ref 3.5–5.0)
Anion gap: 9 (ref 5–15)
BUN: 26 mg/dL — ABNORMAL HIGH (ref 6–20)
CALCIUM: 8.7 mg/dL — AB (ref 8.9–10.3)
CO2: 24 mmol/L (ref 22–32)
CREATININE: 1.54 mg/dL — AB (ref 0.61–1.24)
Chloride: 103 mmol/L (ref 101–111)
GFR, EST AFRICAN AMERICAN: 53 mL/min — AB (ref >60)
GFR, EST NON AFRICAN AMERICAN: 45 mL/min — AB (ref >60)
Glucose, Bld: 229 mg/dL — ABNORMAL HIGH (ref 65–99)
Potassium: 3.8 mmol/L (ref 3.5–5.1)
SODIUM: 136 mmol/L (ref 135–145)
Total Bilirubin: 0.8 mg/dL (ref 0.3–1.2)
Total Protein: 6.8 g/dL (ref 6.5–8.1)

## 2017-05-08 MED ORDER — SODIUM CHLORIDE 0.9 % IV SOLN
2200.0000 mg/m2 | INTRAVENOUS | Status: DC
  Administered 2017-05-08: 5250 mg via INTRAVENOUS
  Filled 2017-05-08: qty 100

## 2017-05-08 MED ORDER — IRINOTECAN HCL CHEMO INJECTION 100 MG/5ML
150.0000 mg/m2 | Freq: Once | INTRAVENOUS | Status: AC
  Administered 2017-05-08: 360 mg via INTRAVENOUS
  Filled 2017-05-08: qty 15

## 2017-05-08 MED ORDER — LEUCOVORIN CALCIUM INJECTION 350 MG
950.0000 mg | Freq: Once | INTRAVENOUS | Status: AC
  Administered 2017-05-08: 950 mg via INTRAVENOUS
  Filled 2017-05-08: qty 47.5

## 2017-05-08 MED ORDER — PALONOSETRON HCL INJECTION 0.25 MG/5ML
0.2500 mg | Freq: Once | INTRAVENOUS | Status: AC
  Administered 2017-05-08: 0.25 mg via INTRAVENOUS
  Filled 2017-05-08: qty 5

## 2017-05-08 MED ORDER — SODIUM CHLORIDE 0.9% FLUSH
10.0000 mL | INTRAVENOUS | Status: DC | PRN
  Administered 2017-05-08: 10 mL via INTRAVENOUS
  Filled 2017-05-08: qty 10

## 2017-05-08 MED ORDER — HEPARIN SOD (PORK) LOCK FLUSH 100 UNIT/ML IV SOLN: 500.0000 [IU] | Freq: Once | INTRAVENOUS | Status: AC

## 2017-05-08 MED ORDER — ATROPINE SULFATE 1 MG/ML IJ SOLN
0.5000 mg | Freq: Once | INTRAMUSCULAR | Status: AC | PRN
  Administered 2017-05-08: 0.5 mg via INTRAVENOUS
  Filled 2017-05-08: qty 1

## 2017-05-08 MED ORDER — SODIUM CHLORIDE 0.9 % IV SOLN
Freq: Once | INTRAVENOUS | Status: AC
  Administered 2017-05-08: 10:00:00 via INTRAVENOUS
  Filled 2017-05-08: qty 1000

## 2017-05-08 NOTE — Assessment & Plan Note (Addendum)
#   RECURRENT RECTAL CANCER STAGE IV- [s/p LN Bx]; currently on 5FU infusion-IRI cycle # 4; tolerated well-except for recent admission to the hospital for sepsis/UTI.  CT Jan 2019- partial response-improvement of the supraclavicular adenopathy mediastinal adenopathy retroperitoneal adenopathy.  Stable to slightly smaller pelvic necrotic mass.  # Proceed with FOLFIRI # 6 today [ irinotecan-150 mg/m square; 5-FU 2200 mg/m square-]. Labs today reviewed;  acceptable for treatment today. Again discussed that treatments are palliative/ usually indefinite.   #Intermittent hematuria-likely secondary to bladder involvement by the tumor; Improved. Continue eliquis-given the extensive DVT.  #  CKD- secondary to bilateral hydronephrosis from the underlying pelvic mass. S/p PNC bil. Renal function improving/stabilizing with creatinine at 1.4-STABLE.   # Back pain- secondary to pelvic mass/PCN; continue hydrocodone as needed  # Left LE swollen compared to right- Hx of DVT-chronic; on Eliquis-not significantly worse.   # DM-2 ; on insulin/ amaryl; On Lantus. Better controlled.  # follow up in 2 weeks/labs;FOLFIRI/labs/CEA;awaiting omniseq  # I reviewed the blood work- with the patient in detail; also reviewed the imaging independently [as summarized above]; and with the patient in detail.

## 2017-05-08 NOTE — Progress Notes (Signed)
[place] NOTE  Patient Care Team: Dion Body, MD as PCP - General (Family Medicine) Clent Jacks, RN as Registered Nurse  CHIEF COMPLAINTS/PURPOSE OF CONSULTATION: RECTAL CANCER  #  Oncology History   # 2012- RECTO-SIGMOID-poorly diff STAGE III CA [s/p neo-adj chemo-RT- poor response]; APR [residual ypT3; 5/8 LN positiveDr.Smith/Dr.Pandit]  S/p FOLFOX   # SEP 2019- Pre-sacral fluid/mass- increasing ~ 5-6 cm [increased from dec 2017; also NEW retroperitoneal lymph nodes; inguinal adenopathy]; SEP 2018- left supraclavicular; mediastinal; right hilar-retroperitoneal; pelvic mass/node; bilateral inguinal adenopathy; s/p right Ingiunal LNBx - RECURRENT CA [necrotic;insuff tissue for F-One]  # OCT 8th 2018-FOLFIRI [first cycle 5FU alone]; OCT 22nd FOLFIRI  #   ------------------------------  ---  OCT-NOV 2018-Sepsis x2 sec to UTI [s/p ABx;]  # s/p RT rectal [11/28-last treatment]  # Acute renal insuff [s/p bil PCN]; hematuria- sec to bladder invol [? Contra-indication to avastin]  # LEFT LE DVT [? [facility]- Dr.Schneir]; Eliquis.   # CHF/COPD/CKD/ PN sec to [facility]  # Foundation One-PDL-1/ TPS- 0% [2012-rectal specimen]; NOT suff; 12/26- order Omniseq     Rectal cancer ([place])    HISTORY OF PRESENTING ILLNESS:  Donald Townsend 67 y.o.  male  above History of recurrent metastatic rectal cancer currently on FOLFIRI status post cycle #5 is here for follow-up; review the results of the CT scan.  Patient continues to admit intermittent blood in his urine from his penis.  No blood in his bilateral nephrostomy tubes.  Denies any significant pain.  Denies any diarrhea. Denies any sores in the mouth.  No nausea no vomiting.  Appetite good.  No weight loss.  ROS: A complete 10 point review of system is done which is negative except mentioned above in history of present illness.   MEDICAL HISTORY:  Past Medical History:  Diagnosis Date  . A-fib  ([place])   . Cancer [facility])    Colon  . CHF (congestive heart failure) ([place])   . Diabetes mellitus without complication ([place])   . Dyspnea   . Elevated lipids   . Hypertension   . PVD (peripheral vascular disease) ([place])     SURGICAL HISTORY: Past Surgical History:  Procedure Laterality Date  . COLON RESECTION     with colostomy  . COLONOSCOPY    . CYSTOSCOPY WITH STENT PLACEMENT Bilateral 01/18/2017   Procedure: CYSTOSCOPY WITH STENT PLACEMENT;  Surgeon: [name], MD;  Location: [facility] ORS;  Service: Urology;  Laterality: Bilateral;  . IR FLUORO GUIDE PORT INSERTION RIGHT  01/24/2017  . IR NEPHROSTOMY EXCHANGE LEFT  03/14/2017  . IR NEPHROSTOMY EXCHANGE LEFT  05/03/2017  . IR NEPHROSTOMY EXCHANGE RIGHT  03/14/2017  . IR NEPHROSTOMY EXCHANGE RIGHT  05/03/2017  . IR NEPHROSTOMY PLACEMENT LEFT  01/19/2017  . IR NEPHROSTOMY PLACEMENT RIGHT  01/19/2017  . IVC FILTER REMOVAL N/A 08/28/2016   Procedure: IVC Filter Removal;  Surgeon: Katha Cabal, MD;  Location: [place] CV LAB;  Service: Cardiovascular;  Laterality: N/A;  . LOWER EXTREMITY VENOGRAPHY Left 12/18/2016   Procedure: Lower Extremity Venography;  Surgeon: Katha Cabal, MD;  Location: [place] [facility] LAB;  Service: Cardiovascular;  Laterality: Left;  . PERIPHERAL VASCULAR CATHETERIZATION Left 05/08/2016   Procedure: Lower Extremity Venography;  Surgeon: Katha Cabal, MD;  Location: [place] [facility] LAB;  Service: Cardiovascular;  Laterality: Left;    SOCIAL HISTORY: [place]; retdOccupational psychologist; currently works part time. He lives alone. No smoking occasional alcohol. Social History  Socioeconomic History  . Marital status: Divorced    Spouse name: Not on file  . Number of children: Not on file  . Years of education: Not on file  . Highest education level: Not on file  Social Needs  . Financial resource strain: Not on file  . Food insecurity - worry: Not on file  . Food insecurity - inability: Not on file   . Transportation needs - medical: Not on file  . Transportation needs - non-medical: Not on file  Occupational History  . Not on file  Tobacco Use  . Smoking status: Never Smoker  . Smokeless tobacco: Never Used  Substance and Sexual Activity  . Alcohol use: No  . Drug use: No  . Sexual activity: Not on file  Other Topics Concern  . Not on file  Social History Narrative  . Not on file    FAMILY HISTORY: Family History  Problem Relation Age of Onset  . Diabetes Mother   . Stroke Mother   . Prostate cancer Brother   . Cancer Sister        Breast cancer  . Kidney cancer Neg Hx   . Bladder Cancer Neg Hx     ALLERGIES:  has No Known Allergies.  MEDICATIONS:  Current Outpatient Medications  Medication Sig Dispense Refill  . B-D ULTRAFINE III SHORT PEN 31G X 8 MM MISC     . Continuous Blood Gluc Receiver (FREESTYLE LIBRE READER) DEVI Use 1 Units as directed. Check CBG's four times daily. Dx: E11.9    . Continuous Blood Gluc Sensor (FREESTYLE LIBRE SENSOR SYSTEM) MISC Use 1 Units as directed. Check CBG's four times daily. Dx: E11.9    . cyclobenzaprine (FEXMID) 7.5 MG tablet Take 1 tablet (7.5 mg total) 3 (three) times daily as needed by mouth for muscle spasms. 30 tablet 0  . digoxin (LANOXIN) 0.25 MG tablet Take 0.25 mg by mouth daily.     [name] ELIQUIS 5 MG TABS tablet Take 5 mg by mouth every 12 (twelve) hours.  0  . feeding supplement, GLUCERNA SHAKE, ([place]) LIQD Follow manufacturer instructions    . furosemide (LASIX) 40 MG tablet   0  . glimepiride (AMARYL) 4 MG tablet Take 1 tablet by mouth daily.    [name] HYDROcodone-acetaminophen (NORCO/VICODIN) 5-325 MG tablet Take 1 tablet by mouth every 6 (six) hours as needed for moderate pain. 90 tablet 0  . insulin aspart (NOVOLOG) 100 UNIT/ML FlexPen Inject 7 Units into the skin 3 (three) times daily with meals.     . Lancets 30G MISC Use 1 Units as directed. Check CBG's up to twice daily. Dx: E11.9    . LANTUS SOLOSTAR 100  UNIT/ML Solostar Pen Inject 20 Units into the skin at bedtime.   0  . losartan (COZAAR) 100 MG tablet Take 100 mg by mouth daily.    . metoprolol tartrate (LOPRESSOR) 25 MG tablet Take 25 mg by mouth 2 (two) times daily.     . ondansetron (ZOFRAN) 8 MG tablet 1 pill every 8 hours for nausea/vomitting as needed 40 tablet 2  . pravastatin (PRAVACHOL) 20 MG tablet take 82m by mouth daily at bedtime    . prochlorperazine (COMPAZINE) 10 MG tablet Take 1 tablet (10 mg total) by mouth every 6 (six) hours as needed for nausea or vomiting. 40 tablet 1  . acidophilus (RISAQUAD) CAPS capsule Take 1 capsule daily by mouth. (Patient not taking: Reported on 05/03/2017) 30 capsule 0   No current  facility-administered medications for this visit.    Facility-Administered Medications Ordered in Other Visits  Medication Dose Route Frequency Provider Last Rate Last Dose  . fluorouracil (ADRUCIL) 5,250 mg in sodium chloride 0.9 % 145 mL chemo infusion  2,200 mg/m2 (Treatment Plan Recorded) Intravenous 1 day or 1 dose Charlaine Dalton R, MD      . heparin lock flush 100 unit/mL  500 Units Intravenous Once Charlaine Dalton R, MD      . irinotecan (CAMPTOSAR) 360 mg in dextrose 5 % 500 mL chemo infusion  150 mg/m2 (Treatment Plan Recorded) Intravenous Once Cammie Sickle, MD 345 mL/hr at 05/08/17 1031 360 mg at 05/08/17 1031  . leucovorin 950 mg in dextrose 5 % 250 mL infusion  950 mg Intravenous Once Charlaine Dalton R, MD 198 mL/hr at 05/08/17 1031 950 mg at 05/08/17 1031  . sodium chloride flush (NS) 0.9 % injection 10 mL  10 mL Intravenous PRN Cammie Sickle, MD   10 mL at 05/08/17 0812      .  PHYSICAL EXAMINATION: ECOG PERFORMANCE STATUS: 0 - Asymptomatic  Vitals:   05/08/17 0846  BP: 119/69  Pulse: 90  Resp: 16  Temp: 97.9 F (36.6 C)   Filed Weights   05/08/17 0846  Weight: 251 lb 12.8 oz (114.2 kg)    GENERAL: Well-nourished well-developed; Alert, no distress and  comfortable.  He is alone. EYES: no pallor or icterus OROPHARYNX: no thrush or ulceration; good dentition  NECK: supple, no masses felt LYMPH:  no palpable lymphadenopathy in the cervical, axillary or inguinal regions LUNGS: clear to auscultation and  No wheeze or crackles HEART/CVS: regular rate & rhythm and no murmurs; No lower extremity edema ABDOMEN: abdomen soft, non-tender and normal bowel sounds; colostomy in place. Bilateral nephrostomy tubes in place. Musculoskeletal:no cyanosis of digits and no clubbing  PSYCH: alert & oriented x 3 with fluent speech NEURO: no focal motor/sensory deficits SKIN:  no rashes or significant lesions  LABORATORY DATA:  I have reviewed the data as listed Lab Results  Component Value Date   WBC 6.5 05/08/2017   HGB 11.4 (L) 05/08/2017   HCT 33.4 (L) 05/08/2017   MCV 87.8 05/08/2017   PLT 224 05/08/2017   Recent Labs    04/10/17 0855 04/24/17 0827 05/08/17 0820  NA 132* 137 136  K 3.6 3.6 3.8  CL 99* 104 103  CO2 '23 24 24  ' GLUCOSE 356* 222* 229*  BUN 24* 24* 26*  CREATININE 1.58* 1.40* 1.54*  CALCIUM 8.6* 9.0 8.7*  GFRNONAA 44* 51* 45*  GFRAA 51* 59* 53*  PROT 7.0 7.0 6.8  ALBUMIN 3.3* 3.4* 3.3*  AST '20 23 17  ' ALT 17 16* 16*  ALKPHOS 73 64 73  BILITOT 0.6 0.7 0.8    RADIOGRAPHIC STUDIES: I have personally reviewed the radiological images as listed and agreed with the findings in the report. Ct Abdomen Pelvis Wo Contrast  Result Date: 05/06/2017 CLINICAL DATA:  Stage III rectal cancer diagnosed 2012 status post neoadjuvant chemotherapy and radiation therapy and subsequent APR. Recurrent stage IV metastatic rectal cancer diagnosed September 2018. Ongoing chemotherapy. Restaging. EXAM: CT CHEST, ABDOMEN AND PELVIS WITHOUT CONTRAST TECHNIQUE: Multidetector CT imaging of the chest, abdomen and pelvis was performed following the standard protocol without IV contrast. COMPARISON:  01/22/2017 PET-CT.  01/11/2017 CT abdomen/pelvis. FINDINGS:  CT CHEST FINDINGS Cardiovascular: Normal heart size. No significant pericardial fluid/thickening. Right internal jugular MediPort terminates at the cavoatrial junction. Coronary atherosclerosis. Atherosclerotic nonaneurysmal thoracic aorta.  Stable top-normal caliber pulmonary arteries. Mediastinum/Nodes: No discrete thyroid nodules. Unremarkable esophagus. No axillary adenopathy. Mildly enlarged left supraclavicular 1.3 cm node (series 2/image 3), previously 1.8 cm on 01/22/2017 PET-CT, decreased. Otherwise no pathologically enlarged mediastinal or discrete hilar nodes on this noncontrast scan. Upper left paraesophageal 0.5 cm node (series 2/ image 12), decreased from 1.2 cm. Lungs/Pleura: No pneumothorax. No pleural effusion. No acute consolidative airspace disease, lung masses or significant pulmonary nodules. Musculoskeletal: No aggressive appearing focal osseous lesions. Moderate thoracic spondylosis. Stable symmetric mild gynecomastia. CT ABDOMEN PELVIS FINDINGS Hepatobiliary: Normal liver size. No liver masses. Normal gallbladder with no radiopaque cholelithiasis. No biliary ductal dilatation. Pancreas: Normal, with no mass or duct dilation. Spleen: Normal size. No mass. Adrenals/Urinary Tract: No discrete adrenal nodules . Bilateral percutaneous nephrostomy tubes are in place with the distal pigtail portions within the central renal collecting systems bilaterally. Minimal fullness of the bilateral renal collecting systems without overt hydronephrosis. No contour deforming renal masses. No renal stones. Stable appearance of the nondistended bladder, which contains a tiny amount of nondependent gas and which demonstrates a diffusely prominently thickened bladder wall. Stomach/Bowel: Normal non-distended stomach. Normal caliber small bowel with no small bowel wall thickening. Normal appendix . Stable postsurgical changes from abdominoperineal resection with end colostomy in the ventral left abdominal wall. No  large bowel wall thickening or new pericolonic fat stranding. Vascular/Lymphatic: Atherosclerotic nonaneurysmal abdominal aorta. Stable position of left common iliac venous stent. Mild left para-aortic adenopathy measuring up to 1.0 cm (series 2/ image 66), previously 1.5 cm, decreased. Mild bilateral inguinal lymphadenopathy is decreased bilaterally. For example a 1.2 cm right inguinal node (series 2/ image 123), decreased from 1.7 cm. No new pathologically enlarged abdominopelvic nodes. Reproductive: Stable encasement of the mildly enlarged prostate by the presacral soft tissue process. Other: No pneumoperitoneum. No ascites. Mixed fluid and soft tissue attenuation presacral mass measures approximately 8.6 x 6.0 cm (series 2/ image 112), previously 8.8 x 6.6 cm, slightly decreased. Stable involvement of the left greater the right posterior bladder wall by this mixed attenuation presacral process. Musculoskeletal: No aggressive appearing focal osseous lesions. Moderate lumbar spondylosis. IMPRESSION: 1. Partial treatment response. Left supraclavicular, mediastinal, retroperitoneal and bilateral pelvic adenopathy is decreased. Mixed attenuation presacral mass is slightly decreased. No new or progressive metastatic disease. 2. Well-positioned bilateral percutaneous nephrostomy tubes. No overt hydronephrosis. 3. Chronic findings include: Aortic Atherosclerosis (ICD10-I70.0). Coronary atherosclerosis. Stable chronic diffuse bladder wall thickening . Electronically Signed   By: Ilona Sorrel M.D.   On: 05/06/2017 17:50   Ct Chest Wo Contrast  Result Date: 05/06/2017 CLINICAL DATA:  Stage III rectal cancer diagnosed 2012 status post neoadjuvant chemotherapy and radiation therapy and subsequent APR. Recurrent stage IV metastatic rectal cancer diagnosed September 2018. Ongoing chemotherapy. Restaging. EXAM: CT CHEST, ABDOMEN AND PELVIS WITHOUT CONTRAST TECHNIQUE: Multidetector CT imaging of the chest, abdomen and pelvis  was performed following the standard protocol without IV contrast. COMPARISON:  01/22/2017 PET-CT.  01/11/2017 CT abdomen/pelvis. FINDINGS: CT CHEST FINDINGS Cardiovascular: Normal heart size. No significant pericardial fluid/thickening. Right internal jugular MediPort terminates at the cavoatrial junction. Coronary atherosclerosis. Atherosclerotic nonaneurysmal thoracic aorta. Stable top-normal caliber pulmonary arteries. Mediastinum/Nodes: No discrete thyroid nodules. Unremarkable esophagus. No axillary adenopathy. Mildly enlarged left supraclavicular 1.3 cm node (series 2/image 3), previously 1.8 cm on 01/22/2017 PET-CT, decreased. Otherwise no pathologically enlarged mediastinal or discrete hilar nodes on this noncontrast scan. Upper left paraesophageal 0.5 cm node (series 2/ image 12), decreased from 1.2 cm. Lungs/Pleura: No pneumothorax. No pleural  effusion. No acute consolidative airspace disease, lung masses or significant pulmonary nodules. Musculoskeletal: No aggressive appearing focal osseous lesions. Moderate thoracic spondylosis. Stable symmetric mild gynecomastia. CT ABDOMEN PELVIS FINDINGS Hepatobiliary: Normal liver size. No liver masses. Normal gallbladder with no radiopaque cholelithiasis. No biliary ductal dilatation. Pancreas: Normal, with no mass or duct dilation. Spleen: Normal size. No mass. Adrenals/Urinary Tract: No discrete adrenal nodules . Bilateral percutaneous nephrostomy tubes are in place with the distal pigtail portions within the central renal collecting systems bilaterally. Minimal fullness of the bilateral renal collecting systems without overt hydronephrosis. No contour deforming renal masses. No renal stones. Stable appearance of the nondistended bladder, which contains a tiny amount of nondependent gas and which demonstrates a diffusely prominently thickened bladder wall. Stomach/Bowel: Normal non-distended stomach. Normal caliber small bowel with no small bowel wall  thickening. Normal appendix . Stable postsurgical changes from abdominoperineal resection with end colostomy in the ventral left abdominal wall. No large bowel wall thickening or new pericolonic fat stranding. Vascular/Lymphatic: Atherosclerotic nonaneurysmal abdominal aorta. Stable position of left common iliac venous stent. Mild left para-aortic adenopathy measuring up to 1.0 cm (series 2/ image 66), previously 1.5 cm, decreased. Mild bilateral inguinal lymphadenopathy is decreased bilaterally. For example a 1.2 cm right inguinal node (series 2/ image 123), decreased from 1.7 cm. No new pathologically enlarged abdominopelvic nodes. Reproductive: Stable encasement of the mildly enlarged prostate by the presacral soft tissue process. Other: No pneumoperitoneum. No ascites. Mixed fluid and soft tissue attenuation presacral mass measures approximately 8.6 x 6.0 cm (series 2/ image 112), previously 8.8 x 6.6 cm, slightly decreased. Stable involvement of the left greater the right posterior bladder wall by this mixed attenuation presacral process. Musculoskeletal: No aggressive appearing focal osseous lesions. Moderate lumbar spondylosis. IMPRESSION: 1. Partial treatment response. Left supraclavicular, mediastinal, retroperitoneal and bilateral pelvic adenopathy is decreased. Mixed attenuation presacral mass is slightly decreased. No new or progressive metastatic disease. 2. Well-positioned bilateral percutaneous nephrostomy tubes. No overt hydronephrosis. 3. Chronic findings include: Aortic Atherosclerosis (ICD10-I70.0). Coronary atherosclerosis. Stable chronic diffuse bladder wall thickening . Electronically Signed   By: Ilona Sorrel M.D.   On: 05/06/2017 17:50   Ir Nephrostomy Exchange Left  Result Date: 05/03/2017 INDICATION: History of advanced colon cancer with invasion of bladder, post placement of bilateral percutaneous nephrostomy catheters by Dr. Kathlene Cote on 12/30/2016. Patient presents today for routine  fluoroscopic guided exchange EXAM: FLUOROSCOPIC GUIDED BILATERAL SIDED NEPHROSTOMY CATHETER EXCHANGE COMPARISON:  None. CONTRAST:  A total of 10 mL Isovue-300 administered was administered into both collecting systems FLUOROSCOPY TIME:  1 minute 6 seconds (42 mGy) COMPLICATIONS: None immediate. TECHNIQUE: Informed written consent was obtained from the patient after a discussion of the risks, benefits and alternatives to treatment. Questions regarding the procedure were encouraged and answered. A timeout was performed prior to the initiation of the procedure. The bilateral flanks and external portions of existing nephrostomy catheters were prepped and draped in the usual sterile fashion. A sterile drape was applied covering the operative field. Maximum barrier sterile technique with sterile gowns and gloves were used for the procedure. A timeout was performed prior to the initiation of the procedure. A pre procedural spot fluoroscopic image was obtained. Beginning with the left-sided nephrostomy, a small amount of contrast was injected via the existing left-sided nephrostomy catheter demonstrating appropriate positioning within the renal pelvis. The existing nephrostomy catheter was cut and cannulated with a Benson wire which was coiled within the renal pelvis. Under intermittent fluoroscopic guidance, the existing nephrostomy catheter was  exchanged for a new 10.2 [place] all-purpose drainage catheter. Limited contrast injection confirmed appropriate positioning within the left renal pelvis and a post exchange fluoroscopic image was obtained. The catheter was locked, secured to the skin with an interrupted suture and reconnected to a gravity bag. The identical repeat procedure was repeated for the contralateral right-sided nephrostomy, ultimately allowing successful exchange of a new 10.2 [place] all-purpose drainage catheter with end coiled and locked within the right renal pelvis. Dressings were placed. The patient  tolerated the above procedures well without immediate postprocedural complication. FINDINGS: The existing nephrostomy catheters are appropriately positioned and functioning. After successful fluoroscopic guided exchange, new bilateral 10.2French nephrostomy catheters are coiled and locked within the respective renal pelvises. IMPRESSION: Successful fluoroscopic guided exchange of bilateral 10.2 French percutaneous nephrostomy catheters. Electronically Signed   By: Sandi Mariscal M.D.   On: 05/03/2017 09:40   Ir Nephrostomy Exchange Right  Result Date: 05/03/2017 INDICATION: History of advanced colon cancer with invasion of bladder, post placement of bilateral percutaneous nephrostomy catheters by Dr. Kathlene Cote on 12/30/2016. Patient presents today for routine fluoroscopic guided exchange EXAM: FLUOROSCOPIC GUIDED BILATERAL SIDED NEPHROSTOMY CATHETER EXCHANGE COMPARISON:  None. CONTRAST:  A total of 10 mL Isovue-300 administered was administered into both collecting systems FLUOROSCOPY TIME:  1 minute 6 seconds (42 mGy) COMPLICATIONS: None immediate. TECHNIQUE: Informed written consent was obtained from the patient after a discussion of the risks, benefits and alternatives to treatment. Questions regarding the procedure were encouraged and answered. A timeout was performed prior to the initiation of the procedure. The bilateral flanks and external portions of existing nephrostomy catheters were prepped and draped in the usual sterile fashion. A sterile drape was applied covering the operative field. Maximum barrier sterile technique with sterile gowns and gloves were used for the procedure. A timeout was performed prior to the initiation of the procedure. A pre procedural spot fluoroscopic image was obtained. Beginning with the left-sided nephrostomy, a small amount of contrast was injected via the existing left-sided nephrostomy catheter demonstrating appropriate positioning within the renal pelvis. The existing  nephrostomy catheter was cut and cannulated with a Benson wire which was coiled within the renal pelvis. Under intermittent fluoroscopic guidance, the existing nephrostomy catheter was exchanged for a new 10.2 [place] all-purpose drainage catheter. Limited contrast injection confirmed appropriate positioning within the left renal pelvis and a post exchange fluoroscopic image was obtained. The catheter was locked, secured to the skin with an interrupted suture and reconnected to a gravity bag. The identical repeat procedure was repeated for the contralateral right-sided nephrostomy, ultimately allowing successful exchange of a new 10.2 [place] all-purpose drainage catheter with end coiled and locked within the right renal pelvis. Dressings were placed. The patient tolerated the above procedures well without immediate postprocedural complication. FINDINGS: The existing nephrostomy catheters are appropriately positioned and functioning. After successful fluoroscopic guided exchange, new bilateral 10.2French nephrostomy catheters are coiled and locked within the respective renal pelvises. IMPRESSION: Successful fluoroscopic guided exchange of bilateral 10.2 French percutaneous nephrostomy catheters. Electronically Signed   By: Sandi Mariscal M.D.   On: 05/03/2017 09:40   Results for CHOUA, CHALKER (MRN [id number]) as of 04/10/2017 09:46  Ref. Range 01/01/2017 16:21 01/16/2017 16:15 02/06/2017 08:20 02/18/2017 08:02 03/16/2017 04:37  CEA Latest Ref Range: 0.0 - 4.7 ng/mL  132.4 (H) 138.0 (H) 160.8 (H) 79.3 (H)  Prostate Specific Ag, Serum Latest Ref Range: 0.0 - 4.0 ng/mL 0.2       ASSESSMENT & PLAN:   Rectal cancer ([place]) #  RECURRENT RECTAL CANCER STAGE IV- [s/p LN Bx]; currently on 5FU infusion-IRI cycle # 4; tolerated well-except for recent admission to the hospital for sepsis/UTI.  CT Jan 2019- partial response-improvement of the supraclavicular adenopathy mediastinal adenopathy retroperitoneal adenopathy.  Stable  to slightly smaller pelvic necrotic mass.  # Proceed with FOLFIRI # 6 today [ irinotecan-150 mg/m square; 5-FU 2200 mg/m square-]. Labs today reviewed;  acceptable for treatment today. Again discussed that treatments are palliative/ usually indefinite.   #Intermittent hematuria-likely secondary to bladder involvement by the tumor; Improved. Continue eliquis-given the extensive DVT.  #  CKD- secondary to bilateral hydronephrosis from the underlying pelvic mass. S/p PNC bil. Renal function improving/stabilizing with creatinine at 1.4-STABLE.   # Back pain- secondary to pelvic mass/PCN; continue hydrocodone as needed  # Left LE swollen compared to right- Hx of DVT-chronic; on Eliquis-not significantly worse.   # DM-2 ; on insulin/ amaryl; On Lantus. Better controlled.  # follow up in 2 weeks/labs;FOLFIRI/labs/CEA;awaiting omniseq  # I reviewed the blood work- with the patient in detail; also reviewed the imaging independently [as summarized above]; and with the patient in detail.   All questions were answered. The patient knows to call the clinic with any problems, questions or concerns.    Cammie Sickle, MD 05/08/2017 10:35 AM

## 2017-05-10 ENCOUNTER — Inpatient Hospital Stay: Payer: Medicare Other

## 2017-05-10 VITALS — BP 112/65 | HR 83 | Temp 98.9°F | Resp 18

## 2017-05-10 DIAGNOSIS — N189 Chronic kidney disease, unspecified: Secondary | ICD-10-CM | POA: Diagnosis not present

## 2017-05-10 DIAGNOSIS — Z5111 Encounter for antineoplastic chemotherapy: Secondary | ICD-10-CM | POA: Diagnosis not present

## 2017-05-10 DIAGNOSIS — C20 Malignant neoplasm of rectum: Secondary | ICD-10-CM | POA: Diagnosis not present

## 2017-05-10 DIAGNOSIS — R319 Hematuria, unspecified: Secondary | ICD-10-CM | POA: Diagnosis not present

## 2017-05-10 DIAGNOSIS — M549 Dorsalgia, unspecified: Secondary | ICD-10-CM | POA: Diagnosis not present

## 2017-05-10 DIAGNOSIS — G893 Neoplasm related pain (acute) (chronic): Secondary | ICD-10-CM | POA: Diagnosis not present

## 2017-05-10 MED ORDER — HEPARIN SOD (PORK) LOCK FLUSH 100 UNIT/ML IV SOLN
500.0000 [IU] | Freq: Once | INTRAVENOUS | Status: AC | PRN
  Administered 2017-05-10: 500 [IU]
  Filled 2017-05-10: qty 5

## 2017-05-10 MED ORDER — SODIUM CHLORIDE 0.9% FLUSH
10.0000 mL | INTRAVENOUS | Status: DC | PRN
  Administered 2017-05-10: 10 mL
  Filled 2017-05-10: qty 10

## 2017-05-21 ENCOUNTER — Ambulatory Visit (INDEPENDENT_AMBULATORY_CARE_PROVIDER_SITE_OTHER): Payer: Medicare Other | Admitting: Urology

## 2017-05-21 ENCOUNTER — Encounter: Payer: Self-pay | Admitting: Urology

## 2017-05-21 VITALS — BP 124/71 | HR 79 | Ht 70.0 in | Wt 248.8 lb

## 2017-05-21 DIAGNOSIS — N1330 Unspecified hydronephrosis: Secondary | ICD-10-CM | POA: Diagnosis not present

## 2017-05-21 DIAGNOSIS — N179 Acute kidney failure, unspecified: Secondary | ICD-10-CM | POA: Diagnosis not present

## 2017-05-21 DIAGNOSIS — C20 Malignant neoplasm of rectum: Secondary | ICD-10-CM | POA: Diagnosis not present

## 2017-05-21 MED ORDER — CIPROFLOXACIN HCL 250 MG PO TABS: 250.0000 mg | ORAL_TABLET | Freq: Two times a day (BID) | ORAL | 0 refills | Status: DC

## 2017-05-21 NOTE — Progress Notes (Signed)
05/21/2017 7:35 PM   Donald Townsend 05/09/1950 [id number]  Referring provider: Dion Body, MD [place] [facility] [place], [place] [address]  Chief Complaint  Patient presents with  . 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  At that time, he was noted to have significant distortion of the normal bladder architecture related to presumed infiltration of colon cancer invading into the bladder wall.   Several episodes of urosepsis since nephrostomy tube placement.    He is under the care of Dr. Rogue Bussing, currently undergoing treatment with [facility].  With treatment, his CEA is falling.  He seems to be tolerating chemotherapy well.  Most recent CT abd/ pelvis 05/06/17 with partial response.    Nephrostomy tubes last exchanged on 05/02/17.  No issues.    PMH: Past Medical History:  Diagnosis Date  . A-fib ([place])   . Cancer [facility])    Colon  . CHF (congestive heart failure) ([place])   . Diabetes mellitus without complication ([place])   . Dyspnea   . Elevated lipids   . Hypertension   . PVD (peripheral vascular disease) [facility])     Surgical History: Past Surgical History:  Procedure Laterality Date  . COLON RESECTION     with colostomy  . COLONOSCOPY    . CYSTOSCOPY WITH STENT PLACEMENT Bilateral 01/18/2017   Procedure: CYSTOSCOPY WITH STENT PLACEMENT;  Surgeon: [name], MD;  Location: [facility] ORS;  Service: Urology;  Laterality: Bilateral;  . IR FLUORO GUIDE PORT INSERTION RIGHT  01/24/2017  . IR NEPHROSTOMY EXCHANGE LEFT  03/14/2017  . IR NEPHROSTOMY EXCHANGE LEFT  05/03/2017  . IR NEPHROSTOMY EXCHANGE RIGHT  03/14/2017  . IR NEPHROSTOMY EXCHANGE RIGHT  05/03/2017  . IR NEPHROSTOMY PLACEMENT LEFT  01/19/2017  . IR NEPHROSTOMY  PLACEMENT RIGHT  01/19/2017  . IVC FILTER REMOVAL N/A 08/28/2016   Procedure: IVC Filter Removal;  Surgeon: Katha Cabal, MD;  Location: [place] [facility] LAB;  Service: Cardiovascular;  Laterality: N/A;  . LOWER EXTREMITY VENOGRAPHY Left 12/18/2016   Procedure: Lower Extremity Venography;  Surgeon: Katha Cabal, MD;  Location: [place] [facility] LAB;  Service: Cardiovascular;  Laterality: Left;  . PERIPHERAL VASCULAR CATHETERIZATION Left 05/08/2016   Procedure: Lower Extremity Venography;  Surgeon: Katha Cabal, MD;  Location: [place] [facility] LAB;  Service: Cardiovascular;  Laterality: Left;    Home Medications:  Allergies as of 05/21/2017   No Known Allergies     Medication List        Accurate as of 05/21/17  7:35 PM. Always use your most recent med list.          acidophilus Caps capsule Take 1 capsule daily by mouth.   B-D ULTRAFINE III SHORT PEN 31G X 8 MM Misc Generic drug:  Insulin Pen Needle   cyclobenzaprine 7.5 MG tablet Commonly known as:  FEXMID Take 1 tablet (7.5 mg total) 3 (three) times daily as needed by mouth for muscle spasms.   digoxin 0.25 MG tablet Commonly known as:  LANOXIN Take 0.25 mg by mouth daily.   ELIQUIS 5 MG Tabs tablet Generic drug:  apixaban Take 5 mg by mouth every 12 (twelve) hours.   feeding supplement (GLUCERNA SHAKE) Liqd Follow manufacturer instructions   FREESTYLE LIBRE READER Devi Use 1 Units as directed.  Check CBG's four times daily. Dx: E11.9   FREESTYLE LIBRE SENSOR SYSTEM Misc Use 1 Units as directed. Check CBG's four times daily. Dx: E11.9   furosemide 40 MG tablet Commonly known as:  LASIX   glimepiride 4 MG tablet Commonly known as:  AMARYL Take 1 tablet by mouth daily.   HYDROcodone-acetaminophen 5-325 MG tablet Commonly known as:  NORCO/VICODIN Take 1 tablet by mouth every 6 (six) hours as needed for moderate pain.   insulin aspart 100 UNIT/ML FlexPen Commonly known as:  NOVOLOG Inject 7 Units into  the skin 3 (three) times daily with meals.   Lancets 30G Misc Use 1 Units as directed. Check CBG's up to twice daily. Dx: E11.9   LANTUS SOLOSTAR 100 UNIT/ML Solostar Pen Generic drug:  Insulin Glargine Inject 20 Units into the skin at bedtime.   losartan 100 MG tablet Commonly known as:  COZAAR Take 100 mg by mouth daily.   metoprolol tartrate 25 MG tablet Commonly known as:  LOPRESSOR Take 25 mg by mouth 2 (two) times daily.   ondansetron 8 MG tablet Commonly known as:  ZOFRAN 1 pill every 8 hours for nausea/vomitting as needed   pravastatin 20 MG tablet Commonly known as:  PRAVACHOL take 20mg  by mouth daily at bedtime   prochlorperazine 10 MG tablet Commonly known as:  COMPAZINE Take 1 tablet (10 mg total) by mouth every 6 (six) hours as needed for nausea or vomiting.       Allergies: No Known Allergies  Family History: Family History  Problem Relation Age of Onset  . Diabetes Mother   . Stroke Mother   . Prostate cancer Brother   . Cancer Sister        Breast cancer  . Kidney cancer Neg Hx   . Bladder Cancer Neg Hx     Social History:  reports that  has never smoked. he has never used smokeless tobacco. He reports that he does not drink alcohol or use drugs.  ROS: UROLOGY Frequent Urination?: No Hard to postpone urination?: No Burning/pain with urination?: No Get up at night to urinate?: No Leakage of urine?: Yes Urine stream starts and stops?: No Trouble starting stream?: No Do you have to strain to urinate?: No Blood in urine?: Yes Urinary tract infection?: No Sexually transmitted disease?: No Injury to kidneys or bladder?: No Painful intercourse?: No Weak stream?: No Erection problems?: No Penile pain?: No  Gastrointestinal Nausea?: No Vomiting?: No Indigestion/heartburn?: No Diarrhea?: No Constipation?: No  Constitutional Fever: No Night sweats?: No Weight loss?: No Fatigue?: No  Skin Skin rash/lesions?: No Itching?:  No  Eyes Blurred vision?: No Double vision?: No  Ears/Nose/Throat Sore throat?: No Sinus problems?: No  Hematologic/Lymphatic Swollen glands?: No Easy bruising?: No  Cardiovascular Leg swelling?: No Chest pain?: No  Respiratory Cough?: No Shortness of breath?: No  Endocrine Excessive thirst?: No  Musculoskeletal Back pain?: No Joint pain?: No  Neurological Headaches?: No Dizziness?: No  Psychologic Depression?: No Anxiety?: No  Physical Exam: BP 124/71 (BP Location: Left Arm, Patient Position: Sitting, Cuff Size: Normal)   Pulse 79   Ht 5\' 10"  (1.778 m)   Wt 248 lb 12.8 oz (112.9 kg)   BMI 35.70 kg/m   Constitutional:  Alert and oriented, No acute distress.  Accompanied by daughter today. HEENT: [place] AT, moist mucus membranes.  Trachea midline, no masses. Cardiovascular: No clubbing, cyanosis, or edema. Respiratory: Normal respiratory effort, no increased work of breathing. GU:  Bilateral nephrostomy tubes in place,  each draining clear yellow urine.  Skin: No rashes, bruises or suspicious lesions. Neurologic: Grossly intact, no focal deficits, moving all 4 extremities. Psychiatric: Normal mood and affect.  Laboratory Data: Lab Results  Component Value Date   WBC 6.5 05/08/2017   HGB 11.4 (L) 05/08/2017   HCT 33.4 (L) 05/08/2017   MCV 87.8 05/08/2017   PLT 224 05/08/2017    Lab Results  Component Value Date   CREATININE 1.54 (H) 05/08/2017    Lab Results  Component Value Date   PSA1 0.2 01/01/2017    Lab Results  Component Value Date   HGBA1C 7.5 (H) 01/19/2017    Urinalysis N/a   Pertinent Imaging: No new interval imaging  Assessment & Plan:     1. Rectal cancer ([place]) Stage IV, managed by Dr. Tish Men Currently on chemo  2. AKI (acute kidney injury) ([place]) Creatinine stabilized, nadir ~1.5  3. Hydronephrosis, bilateral Due for tube exchange, will increase frequency to q6 weeks, last exchange 05/03/16 Will plan to treat 3  days around the time of tube exchanges as abx ppx High risk for urosespis due to indwelling nephrostomy tubes and immunocompromise from chemo  Hollice Espy, MD  [place] [address] [place], [place] [address] [phone]

## 2017-05-22 ENCOUNTER — Telehealth: Payer: Self-pay | Admitting: Radiology

## 2017-05-22 ENCOUNTER — Inpatient Hospital Stay: Payer: Medicare Other

## 2017-05-22 ENCOUNTER — Other Ambulatory Visit: Payer: Self-pay

## 2017-05-22 ENCOUNTER — Inpatient Hospital Stay (HOSPITAL_BASED_OUTPATIENT_CLINIC_OR_DEPARTMENT_OTHER): Payer: Medicare Other | Admitting: Internal Medicine

## 2017-05-22 VITALS — BP 151/81 | HR 83 | Temp 96.1°F | Resp 20

## 2017-05-22 DIAGNOSIS — R319 Hematuria, unspecified: Secondary | ICD-10-CM | POA: Diagnosis not present

## 2017-05-22 DIAGNOSIS — C20 Malignant neoplasm of rectum: Secondary | ICD-10-CM

## 2017-05-22 DIAGNOSIS — Z7901 Long term (current) use of anticoagulants: Secondary | ICD-10-CM | POA: Diagnosis not present

## 2017-05-22 DIAGNOSIS — G893 Neoplasm related pain (acute) (chronic): Secondary | ICD-10-CM | POA: Diagnosis not present

## 2017-05-22 DIAGNOSIS — Z86718 Personal history of other venous thrombosis and embolism: Secondary | ICD-10-CM

## 2017-05-22 DIAGNOSIS — Z803 Family history of malignant neoplasm of breast: Secondary | ICD-10-CM

## 2017-05-22 DIAGNOSIS — Z8042 Family history of malignant neoplasm of prostate: Secondary | ICD-10-CM | POA: Diagnosis not present

## 2017-05-22 DIAGNOSIS — Z5111 Encounter for antineoplastic chemotherapy: Secondary | ICD-10-CM | POA: Diagnosis not present

## 2017-05-22 DIAGNOSIS — Z923 Personal history of irradiation: Secondary | ICD-10-CM

## 2017-05-22 DIAGNOSIS — M549 Dorsalgia, unspecified: Secondary | ICD-10-CM | POA: Diagnosis not present

## 2017-05-22 DIAGNOSIS — N189 Chronic kidney disease, unspecified: Secondary | ICD-10-CM | POA: Diagnosis not present

## 2017-05-22 LAB — CBC WITH DIFFERENTIAL/PLATELET
Basophils Absolute: 0.1 K/uL (ref 0–0.1)
Basophils Relative: 1 %
Eosinophils Absolute: 0.3 K/uL (ref 0–0.7)
Eosinophils Relative: 4 %
HCT: 33.7 % — ABNORMAL LOW (ref 40.0–52.0)
Hemoglobin: 11.5 g/dL — ABNORMAL LOW (ref 13.0–18.0)
Lymphocytes Relative: 12 %
Lymphs Abs: 0.8 K/uL — ABNORMAL LOW (ref 1.0–3.6)
MCH: 30.2 pg (ref 26.0–34.0)
MCHC: 34 g/dL (ref 32.0–36.0)
MCV: 88.8 fL (ref 80.0–100.0)
Monocytes Absolute: 0.7 K/uL (ref 0.2–1.0)
Monocytes Relative: 10 %
Neutro Abs: 4.8 K/uL (ref 1.4–6.5)
Neutrophils Relative %: 73 %
Platelets: 200 K/uL (ref 150–440)
RBC: 3.8 MIL/uL — ABNORMAL LOW (ref 4.40–5.90)
RDW: 16.3 % — ABNORMAL HIGH (ref 11.5–14.5)
WBC: 6.6 K/uL (ref 3.8–10.6)

## 2017-05-22 LAB — COMPREHENSIVE METABOLIC PANEL WITH GFR
ALT: 14 U/L — ABNORMAL LOW (ref 17–63)
AST: 19 U/L (ref 15–41)
Albumin: 3.5 g/dL (ref 3.5–5.0)
Alkaline Phosphatase: 61 U/L (ref 38–126)
Anion gap: 6 (ref 5–15)
BUN: 20 mg/dL (ref 6–20)
CO2: 23 mmol/L (ref 22–32)
Calcium: 8.6 mg/dL — ABNORMAL LOW (ref 8.9–10.3)
Chloride: 105 mmol/L (ref 101–111)
Creatinine, Ser: 1.45 mg/dL — ABNORMAL HIGH (ref 0.61–1.24)
GFR calc Af Amer: 56 mL/min — ABNORMAL LOW (ref >60)
GFR calc non Af Amer: 49 mL/min — ABNORMAL LOW (ref >60)
Glucose, Bld: 174 mg/dL — ABNORMAL HIGH (ref 65–99)
Potassium: 3.9 mmol/L (ref 3.5–5.1)
Sodium: 134 mmol/L — ABNORMAL LOW (ref 135–145)
Total Bilirubin: 0.5 mg/dL (ref 0.3–1.2)
Total Protein: 6.8 g/dL (ref 6.5–8.1)

## 2017-05-22 MED ORDER — SODIUM CHLORIDE 0.9% FLUSH
10.0000 mL | INTRAVENOUS | Status: AC | PRN
  Administered 2017-05-22: 10 mL
  Filled 2017-05-22: qty 10

## 2017-05-22 MED ORDER — SODIUM CHLORIDE 0.9 % IV SOLN
2200.0000 mg/m2 | INTRAVENOUS | Status: DC
  Administered 2017-05-22: 5250 mg via INTRAVENOUS
  Filled 2017-05-22: qty 105

## 2017-05-22 MED ORDER — PALONOSETRON HCL INJECTION 0.25 MG/5ML
0.2500 mg | Freq: Once | INTRAVENOUS | Status: AC
  Administered 2017-05-22: 0.25 mg via INTRAVENOUS
  Filled 2017-05-22: qty 5

## 2017-05-22 MED ORDER — SODIUM CHLORIDE 0.9 % IV SOLN
Freq: Once | INTRAVENOUS | Status: AC
  Administered 2017-05-22: 10:00:00 via INTRAVENOUS
  Filled 2017-05-22: qty 1000

## 2017-05-22 MED ORDER — LEUCOVORIN CALCIUM INJECTION 350 MG
950.0000 mg | Freq: Once | INTRAVENOUS | Status: AC
  Administered 2017-05-22: 950 mg via INTRAVENOUS
  Filled 2017-05-22: qty 47.5

## 2017-05-22 MED ORDER — DEXTROSE 5 % IV SOLN
150.0000 mg/m2 | Freq: Once | INTRAVENOUS | Status: AC
  Administered 2017-05-22: 360 mg via INTRAVENOUS
  Filled 2017-05-22: qty 15

## 2017-05-22 MED ORDER — ATROPINE SULFATE 1 MG/ML IJ SOLN
0.5000 mg | Freq: Once | INTRAMUSCULAR | Status: AC | PRN
  Administered 2017-05-22: 0.5 mg via INTRAVENOUS
  Filled 2017-05-22: qty 1

## 2017-05-22 NOTE — Telephone Encounter (Signed)
-----   Message from Hollice Espy, MD sent at 05/21/2017  7:47 PM EST ----- This guy!  Due in a few weeks (6 weeks from last exchange.)  Abx called in.

## 2017-05-22 NOTE — Assessment & Plan Note (Addendum)
#   RECURRENT RECTAL CANCER STAGE IV- [s/p LN Bx]; currently on 5FU infusion-IRI cycle # 6- tolerating better.    #  CT Jan 2019- partial response-improvement of the supraclavicular adenopathy mediastinal adenopathy retroperitoneal adenopathy.  Stable to slightly smaller pelvic necrotic mass. CEA improving.   # Proceed with FOLFIRI # 7 today [ irinotecan-150 mg/m square; 5-FU 2200 mg/m square-]. Labs today reviewed;  acceptable for treatment today.    #  CKD- secondary to bilateral hydronephrosis from the underlying pelvic mass. S/p PNC bil. Renal function improving/stabilizing with creatinine at 1.4-STABLE.   # Back pain- secondary to pelvic mass/PCN; continue hydrocodone as needed  # Left LE swollen compared to right- Hx of DVT-chronic; on Eliquis-not significantly worse.   # DM-2 ; on insulin/ amaryl; On Lantus. Better controlled.  # follow up in 2 weeks/labs;FOLFIRI/labs/CEA;awaiting omniseq- discussed with Dr.Baker

## 2017-05-22 NOTE — Progress Notes (Signed)
[place] NOTE  Patient Care Team: Dion Body, MD as PCP - General (Family Medicine) Clent Jacks, RN as Registered Nurse  CHIEF COMPLAINTS/PURPOSE OF CONSULTATION: RECTAL CANCER  #  Oncology History   # 2012- RECTO-SIGMOID-poorly diff STAGE III CA [s/p neo-adj chemo-RT- poor response]; APR [residual ypT3; 5/8 LN positiveDr.Smith/Dr.Pandit]  S/p FOLFOX   # SEP 2019- Pre-sacral fluid/mass- increasing ~ 5-6 cm [increased from dec 2017; also NEW retroperitoneal lymph nodes; inguinal adenopathy]; SEP 2018- left supraclavicular; mediastinal; right hilar-retroperitoneal; pelvic mass/node; bilateral inguinal adenopathy; s/p right Ingiunal LNBx - RECURRENT CA [necrotic;insuff tissue for F-One]  # OCT 8th 2018-FOLFIRI [first cycle 5FU alone]; OCT 22nd FOLFIRI  #   ------------------------------  ---  OCT-NOV 2018-Sepsis x2 sec to UTI [s/p ABx;]  # s/p RT rectal [11/28-last treatment]  # Acute renal insuff [s/p bil PCN]; hematuria- sec to bladder invol [? Contra-indication to avastin]  # LEFT LE DVT [? [facility]- Dr.Schneir]; Eliquis.   # CHF/COPD/CKD/ PN sec to [facility]  # Foundation One-PDL-1/ TPS- 0% [2012-rectal specimen]; NOT suff; 12/26- order Omniseq     Rectal cancer ([place])    HISTORY OF PRESENTING ILLNESS:  Donald Townsend 67 y.o.  male  above History of recurrent metastatic rectal cancer currently on FOLFIRI status post cycle #6  is here for follow-up; review the results of the CT scan.  Denies any diarrhea. Denies any sores in the mouth.  No nausea no vomiting.  Appetite good.  No weight loss.  ROS: A complete 10 point review of system is done which is negative except mentioned above in history of present illness.   MEDICAL HISTORY:  Past Medical History:  Diagnosis Date  . A-fib ([place])   . Cancer [facility])    Colon  . CHF (congestive heart failure) ([place])   . Diabetes mellitus without complication ([place])   . Dyspnea   . Elevated  lipids   . Hypertension   . PVD (peripheral vascular disease) ([place])     SURGICAL HISTORY: Past Surgical History:  Procedure Laterality Date  . COLON RESECTION     with colostomy  . COLONOSCOPY    . CYSTOSCOPY WITH STENT PLACEMENT Bilateral 01/18/2017   Procedure: CYSTOSCOPY WITH STENT PLACEMENT;  Surgeon: [name], MD;  Location: [facility] ORS;  Service: Urology;  Laterality: Bilateral;  . IR FLUORO GUIDE PORT INSERTION RIGHT  01/24/2017  . IR NEPHROSTOMY EXCHANGE LEFT  03/14/2017  . IR NEPHROSTOMY EXCHANGE LEFT  05/03/2017  . IR NEPHROSTOMY EXCHANGE RIGHT  03/14/2017  . IR NEPHROSTOMY EXCHANGE RIGHT  05/03/2017  . IR NEPHROSTOMY PLACEMENT LEFT  01/19/2017  . IR NEPHROSTOMY PLACEMENT RIGHT  01/19/2017  . IVC FILTER REMOVAL N/A 08/28/2016   Procedure: IVC Filter Removal;  Surgeon: Katha Cabal, MD;  Location: [place] CV LAB;  Service: Cardiovascular;  Laterality: N/A;  . LOWER EXTREMITY VENOGRAPHY Left 12/18/2016   Procedure: Lower Extremity Venography;  Surgeon: Katha Cabal, MD;  Location: [place] CV LAB;  Service: Cardiovascular;  Laterality: Left;  . PERIPHERAL VASCULAR CATHETERIZATION Left 05/08/2016   Procedure: Lower Extremity Venography;  Surgeon: Katha Cabal, MD;  Location: [place] [facility] LAB;  Service: Cardiovascular;  Laterality: Left;    SOCIAL HISTORY: [place]; retdOccupational psychologist; currently works part time. He lives alone. No smoking occasional alcohol. Social History   Socioeconomic History  . Marital status: Divorced    Spouse name: Not on file  . Number of children: Not on file  .  Years of education: Not on file  . Highest education level: Not on file  Social Needs  . Financial resource strain: Not on file  . Food insecurity - worry: Not on file  . Food insecurity - inability: Not on file  . Transportation needs - medical: Not on file  . Transportation needs - non-medical: Not on file  Occupational History  . Not on file  Tobacco  Use  . Smoking status: Never Smoker  . Smokeless tobacco: Never Used  Substance and Sexual Activity  . Alcohol use: No  . Drug use: No  . Sexual activity: Not on file  Other Topics Concern  . Not on file  Social History Narrative  . Not on file    FAMILY HISTORY: Family History  Problem Relation Age of Onset  . Diabetes Mother   . Stroke Mother   . Prostate cancer Brother   . Cancer Sister        Breast cancer  . Kidney cancer Neg Hx   . Bladder Cancer Neg Hx     ALLERGIES:  has No Known Allergies.  MEDICATIONS:  Current Outpatient Medications  Medication Sig Dispense Refill  . acidophilus (RISAQUAD) CAPS capsule Take 1 capsule daily by mouth. 30 capsule 0  . B-D ULTRAFINE III SHORT PEN 31G X 8 MM MISC     . ciprofloxacin (CIPRO) 250 MG tablet Take 1 tablet (250 mg total) by mouth 2 (two) times daily. Take starting the day before nephrostomy tube exchange 6 tablet 0  . Continuous Blood Gluc Receiver (FREESTYLE LIBRE READER) DEVI Use 1 Units as directed. Check CBG's four times daily. Dx: E11.9    . Continuous Blood Gluc Sensor (FREESTYLE LIBRE SENSOR SYSTEM) MISC Use 1 Units as directed. Check CBG's four times daily. Dx: E11.9    . cyclobenzaprine (FEXMID) 7.5 MG tablet Take 1 tablet (7.5 mg total) 3 (three) times daily as needed by mouth for muscle spasms. 30 tablet 0  . digoxin (LANOXIN) 0.25 MG tablet Take 0.25 mg by mouth daily.     [name] ELIQUIS 5 MG TABS tablet Take 5 mg by mouth every 12 (twelve) hours.  0  . feeding supplement, GLUCERNA SHAKE, ([place]) LIQD Follow manufacturer instructions    . furosemide (LASIX) 40 MG tablet Take 40 mg by mouth daily.   0  . glimepiride (AMARYL) 4 MG tablet Take 1 tablet by mouth daily.    [name] HYDROcodone-acetaminophen (NORCO/VICODIN) 5-325 MG tablet Take 1 tablet by mouth every 6 (six) hours as needed for moderate pain. 90 tablet 0  . insulin aspart (NOVOLOG) 100 UNIT/ML FlexPen Inject 7 Units into the skin 3 (three) times daily  with meals.     . Lancets 30G MISC Use 1 Units as directed. Check CBG's up to twice daily. Dx: E11.9    . LANTUS SOLOSTAR 100 UNIT/ML Solostar Pen Inject 20 Units into the skin at bedtime.   0  . losartan (COZAAR) 100 MG tablet Take 100 mg by mouth daily.    . metoprolol tartrate (LOPRESSOR) 25 MG tablet Take 25 mg by mouth 2 (two) times daily.     . ondansetron (ZOFRAN) 8 MG tablet 1 pill every 8 hours for nausea/vomitting as needed 40 tablet 2  . pravastatin (PRAVACHOL) 20 MG tablet take 25m by mouth daily at bedtime    . prochlorperazine (COMPAZINE) 10 MG tablet Take 1 tablet (10 mg total) by mouth every 6 (six) hours as needed for nausea or vomiting. 40 tablet  1   No current facility-administered medications for this visit.       [name]  PHYSICAL EXAMINATION: ECOG PERFORMANCE STATUS: 0 - Asymptomatic  Vitals:   05/22/17 0900  BP: (!) 151/81  Pulse: 83  Resp: 20  Temp: (!) 96.1 F (35.6 C)   There were no vitals filed for this visit.  GENERAL: Well-nourished well-developed; Alert, no distress and comfortable.  He is alone. EYES: no pallor or icterus OROPHARYNX: no thrush or ulceration; good dentition  NECK: supple, no masses felt LYMPH:  no palpable lymphadenopathy in the cervical, axillary or inguinal regions LUNGS: clear to auscultation and  No wheeze or crackles HEART/CVS: regular rate & rhythm and no murmurs; No lower extremity edema ABDOMEN: abdomen soft, non-tender and normal bowel sounds; colostomy in place. Bilateral nephrostomy tubes in place. Musculoskeletal:no cyanosis of digits and no clubbing  PSYCH: alert & oriented x 3 with fluent speech NEURO: no focal motor/sensory deficits SKIN:  no rashes or significant lesions  LABORATORY DATA:  I have reviewed the data as listed Lab Results  Component Value Date   WBC 6.6 05/22/2017   HGB 11.5 (L) 05/22/2017   HCT 33.7 (L) 05/22/2017   MCV 88.8 05/22/2017   PLT 200 05/22/2017   Recent Labs    04/24/17 0827  05/08/17 0820 05/22/17 0846  NA 137 136 134*  K 3.6 3.8 3.9  CL 104 103 105  CO2 '24 24 23  ' GLUCOSE 222* 229* 174*  BUN 24* 26* 20  CREATININE 1.40* 1.54* 1.45*  CALCIUM 9.0 8.7* 8.6*  GFRNONAA 51* 45* 49*  GFRAA 59* 53* 56*  PROT 7.0 6.8 6.8  ALBUMIN 3.4* 3.3* 3.5  AST '23 17 19  ' ALT 16* 16* 14*  ALKPHOS 64 73 61  BILITOT 0.7 0.8 0.5    RADIOGRAPHIC STUDIES: I have personally reviewed the radiological images as listed and agreed with the findings in the report. Ct Abdomen Pelvis Wo Contrast  Result Date: 05/06/2017 CLINICAL DATA:  Stage III rectal cancer diagnosed 2012 status post neoadjuvant chemotherapy and radiation therapy and subsequent APR. Recurrent stage IV metastatic rectal cancer diagnosed September 2018. Ongoing chemotherapy. Restaging. EXAM: CT CHEST, ABDOMEN AND PELVIS WITHOUT CONTRAST TECHNIQUE: Multidetector CT imaging of the chest, abdomen and pelvis was performed following the standard protocol without IV contrast. COMPARISON:  01/22/2017 PET-CT.  01/11/2017 CT abdomen/pelvis. FINDINGS: CT CHEST FINDINGS Cardiovascular: Normal heart size. No significant pericardial fluid/thickening. Right internal jugular MediPort terminates at the cavoatrial junction. Coronary atherosclerosis. Atherosclerotic nonaneurysmal thoracic aorta. Stable top-normal caliber pulmonary arteries. Mediastinum/Nodes: No discrete thyroid nodules. Unremarkable esophagus. No axillary adenopathy. Mildly enlarged left supraclavicular 1.3 cm node (series 2/image 3), previously 1.8 cm on 01/22/2017 PET-CT, decreased. Otherwise no pathologically enlarged mediastinal or discrete hilar nodes on this noncontrast scan. Upper left paraesophageal 0.5 cm node (series 2/ image 12), decreased from 1.2 cm. Lungs/Pleura: No pneumothorax. No pleural effusion. No acute consolidative airspace disease, lung masses or significant pulmonary nodules. Musculoskeletal: No aggressive appearing focal osseous lesions. Moderate thoracic  spondylosis. Stable symmetric mild gynecomastia. CT ABDOMEN PELVIS FINDINGS Hepatobiliary: Normal liver size. No liver masses. Normal gallbladder with no radiopaque cholelithiasis. No biliary ductal dilatation. Pancreas: Normal, with no mass or duct dilation. Spleen: Normal size. No mass. Adrenals/Urinary Tract: No discrete adrenal nodules . Bilateral percutaneous nephrostomy tubes are in place with the distal pigtail portions within the central renal collecting systems bilaterally. Minimal fullness of the bilateral renal collecting systems without overt hydronephrosis. No contour deforming renal masses. No renal  stones. Stable appearance of the nondistended bladder, which contains a tiny amount of nondependent gas and which demonstrates a diffusely prominently thickened bladder wall. Stomach/Bowel: Normal non-distended stomach. Normal caliber small bowel with no small bowel wall thickening. Normal appendix . Stable postsurgical changes from abdominoperineal resection with end colostomy in the ventral left abdominal wall. No large bowel wall thickening or new pericolonic fat stranding. Vascular/Lymphatic: Atherosclerotic nonaneurysmal abdominal aorta. Stable position of left common iliac venous stent. Mild left para-aortic adenopathy measuring up to 1.0 cm (series 2/ image 66), previously 1.5 cm, decreased. Mild bilateral inguinal lymphadenopathy is decreased bilaterally. For example a 1.2 cm right inguinal node (series 2/ image 123), decreased from 1.7 cm. No new pathologically enlarged abdominopelvic nodes. Reproductive: Stable encasement of the mildly enlarged prostate by the presacral soft tissue process. Other: No pneumoperitoneum. No ascites. Mixed fluid and soft tissue attenuation presacral mass measures approximately 8.6 x 6.0 cm (series 2/ image 112), previously 8.8 x 6.6 cm, slightly decreased. Stable involvement of the left greater the right posterior bladder wall by this mixed attenuation presacral  process. Musculoskeletal: No aggressive appearing focal osseous lesions. Moderate lumbar spondylosis. IMPRESSION: 1. Partial treatment response. Left supraclavicular, mediastinal, retroperitoneal and bilateral pelvic adenopathy is decreased. Mixed attenuation presacral mass is slightly decreased. No new or progressive metastatic disease. 2. Well-positioned bilateral percutaneous nephrostomy tubes. No overt hydronephrosis. 3. Chronic findings include: Aortic Atherosclerosis (ICD10-I70.0). Coronary atherosclerosis. Stable chronic diffuse bladder wall thickening . Electronically Signed   By: Ilona Sorrel M.D.   On: 05/06/2017 17:50   Ct Chest Wo Contrast  Result Date: 05/06/2017 CLINICAL DATA:  Stage III rectal cancer diagnosed 2012 status post neoadjuvant chemotherapy and radiation therapy and subsequent APR. Recurrent stage IV metastatic rectal cancer diagnosed September 2018. Ongoing chemotherapy. Restaging. EXAM: CT CHEST, ABDOMEN AND PELVIS WITHOUT CONTRAST TECHNIQUE: Multidetector CT imaging of the chest, abdomen and pelvis was performed following the standard protocol without IV contrast. COMPARISON:  01/22/2017 PET-CT.  01/11/2017 CT abdomen/pelvis. FINDINGS: CT CHEST FINDINGS Cardiovascular: Normal heart size. No significant pericardial fluid/thickening. Right internal jugular MediPort terminates at the cavoatrial junction. Coronary atherosclerosis. Atherosclerotic nonaneurysmal thoracic aorta. Stable top-normal caliber pulmonary arteries. Mediastinum/Nodes: No discrete thyroid nodules. Unremarkable esophagus. No axillary adenopathy. Mildly enlarged left supraclavicular 1.3 cm node (series 2/image 3), previously 1.8 cm on 01/22/2017 PET-CT, decreased. Otherwise no pathologically enlarged mediastinal or discrete hilar nodes on this noncontrast scan. Upper left paraesophageal 0.5 cm node (series 2/ image 12), decreased from 1.2 cm. Lungs/Pleura: No pneumothorax. No pleural effusion. No acute consolidative  airspace disease, lung masses or significant pulmonary nodules. Musculoskeletal: No aggressive appearing focal osseous lesions. Moderate thoracic spondylosis. Stable symmetric mild gynecomastia. CT ABDOMEN PELVIS FINDINGS Hepatobiliary: Normal liver size. No liver masses. Normal gallbladder with no radiopaque cholelithiasis. No biliary ductal dilatation. Pancreas: Normal, with no mass or duct dilation. Spleen: Normal size. No mass. Adrenals/Urinary Tract: No discrete adrenal nodules . Bilateral percutaneous nephrostomy tubes are in place with the distal pigtail portions within the central renal collecting systems bilaterally. Minimal fullness of the bilateral renal collecting systems without overt hydronephrosis. No contour deforming renal masses. No renal stones. Stable appearance of the nondistended bladder, which contains a tiny amount of nondependent gas and which demonstrates a diffusely prominently thickened bladder wall. Stomach/Bowel: Normal non-distended stomach. Normal caliber small bowel with no small bowel wall thickening. Normal appendix . Stable postsurgical changes from abdominoperineal resection with end colostomy in the ventral left abdominal wall. No large bowel wall thickening or new pericolonic  fat stranding. Vascular/Lymphatic: Atherosclerotic nonaneurysmal abdominal aorta. Stable position of left common iliac venous stent. Mild left para-aortic adenopathy measuring up to 1.0 cm (series 2/ image 66), previously 1.5 cm, decreased. Mild bilateral inguinal lymphadenopathy is decreased bilaterally. For example a 1.2 cm right inguinal node (series 2/ image 123), decreased from 1.7 cm. No new pathologically enlarged abdominopelvic nodes. Reproductive: Stable encasement of the mildly enlarged prostate by the presacral soft tissue process. Other: No pneumoperitoneum. No ascites. Mixed fluid and soft tissue attenuation presacral mass measures approximately 8.6 x 6.0 cm (series 2/ image 112), previously  8.8 x 6.6 cm, slightly decreased. Stable involvement of the left greater the right posterior bladder wall by this mixed attenuation presacral process. Musculoskeletal: No aggressive appearing focal osseous lesions. Moderate lumbar spondylosis. IMPRESSION: 1. Partial treatment response. Left supraclavicular, mediastinal, retroperitoneal and bilateral pelvic adenopathy is decreased. Mixed attenuation presacral mass is slightly decreased. No new or progressive metastatic disease. 2. Well-positioned bilateral percutaneous nephrostomy tubes. No overt hydronephrosis. 3. Chronic findings include: Aortic Atherosclerosis (ICD10-I70.0). Coronary atherosclerosis. Stable chronic diffuse bladder wall thickening . Electronically Signed   By: Ilona Sorrel M.D.   On: 05/06/2017 17:50   Ir Nephrostomy Exchange Left  Result Date: 05/03/2017 INDICATION: History of advanced colon cancer with invasion of bladder, post placement of bilateral percutaneous nephrostomy catheters by Dr. Kathlene Cote on 12/30/2016. Patient presents today for routine fluoroscopic guided exchange EXAM: FLUOROSCOPIC GUIDED BILATERAL SIDED NEPHROSTOMY CATHETER EXCHANGE COMPARISON:  None. CONTRAST:  A total of 10 mL Isovue-300 administered was administered into both collecting systems FLUOROSCOPY TIME:  1 minute 6 seconds (42 mGy) COMPLICATIONS: None immediate. TECHNIQUE: Informed written consent was obtained from the patient after a discussion of the risks, benefits and alternatives to treatment. Questions regarding the procedure were encouraged and answered. A timeout was performed prior to the initiation of the procedure. The bilateral flanks and external portions of existing nephrostomy catheters were prepped and draped in the usual sterile fashion. A sterile drape was applied covering the operative field. Maximum barrier sterile technique with sterile gowns and gloves were used for the procedure. A timeout was performed prior to the initiation of the  procedure. A pre procedural spot fluoroscopic image was obtained. Beginning with the left-sided nephrostomy, a small amount of contrast was injected via the existing left-sided nephrostomy catheter demonstrating appropriate positioning within the renal pelvis. The existing nephrostomy catheter was cut and cannulated with a Benson wire which was coiled within the renal pelvis. Under intermittent fluoroscopic guidance, the existing nephrostomy catheter was exchanged for a new 10.2 [place] all-purpose drainage catheter. Limited contrast injection confirmed appropriate positioning within the left renal pelvis and a post exchange fluoroscopic image was obtained. The catheter was locked, secured to the skin with an interrupted suture and reconnected to a gravity bag. The identical repeat procedure was repeated for the contralateral right-sided nephrostomy, ultimately allowing successful exchange of a new 10.2 [place] all-purpose drainage catheter with end coiled and locked within the right renal pelvis. Dressings were placed. The patient tolerated the above procedures well without immediate postprocedural complication. FINDINGS: The existing nephrostomy catheters are appropriately positioned and functioning. After successful fluoroscopic guided exchange, new bilateral 10.2French nephrostomy catheters are coiled and locked within the respective renal pelvises. IMPRESSION: Successful fluoroscopic guided exchange of bilateral 10.2 French percutaneous nephrostomy catheters. Electronically Signed   By: Sandi Mariscal M.D.   On: 05/03/2017 09:40   Ir Nephrostomy Exchange Right  Result Date: 05/03/2017 INDICATION: History of advanced colon cancer with invasion of bladder,  post placement of bilateral percutaneous nephrostomy catheters by Dr. Kathlene Cote on 12/30/2016. Patient presents today for routine fluoroscopic guided exchange EXAM: FLUOROSCOPIC GUIDED BILATERAL SIDED NEPHROSTOMY CATHETER EXCHANGE COMPARISON:  None. CONTRAST:  A  total of 10 mL Isovue-300 administered was administered into both collecting systems FLUOROSCOPY TIME:  1 minute 6 seconds (42 mGy) COMPLICATIONS: None immediate. TECHNIQUE: Informed written consent was obtained from the patient after a discussion of the risks, benefits and alternatives to treatment. Questions regarding the procedure were encouraged and answered. A timeout was performed prior to the initiation of the procedure. The bilateral flanks and external portions of existing nephrostomy catheters were prepped and draped in the usual sterile fashion. A sterile drape was applied covering the operative field. Maximum barrier sterile technique with sterile gowns and gloves were used for the procedure. A timeout was performed prior to the initiation of the procedure. A pre procedural spot fluoroscopic image was obtained. Beginning with the left-sided nephrostomy, a small amount of contrast was injected via the existing left-sided nephrostomy catheter demonstrating appropriate positioning within the renal pelvis. The existing nephrostomy catheter was cut and cannulated with a Benson wire which was coiled within the renal pelvis. Under intermittent fluoroscopic guidance, the existing nephrostomy catheter was exchanged for a new 10.2 [place] all-purpose drainage catheter. Limited contrast injection confirmed appropriate positioning within the left renal pelvis and a post exchange fluoroscopic image was obtained. The catheter was locked, secured to the skin with an interrupted suture and reconnected to a gravity bag. The identical repeat procedure was repeated for the contralateral right-sided nephrostomy, ultimately allowing successful exchange of a new 10.2 [place] all-purpose drainage catheter with end coiled and locked within the right renal pelvis. Dressings were placed. The patient tolerated the above procedures well without immediate postprocedural complication. FINDINGS: The existing nephrostomy catheters are  appropriately positioned and functioning. After successful fluoroscopic guided exchange, new bilateral 10.2French nephrostomy catheters are coiled and locked within the respective renal pelvises. IMPRESSION: Successful fluoroscopic guided exchange of bilateral 10.2 French percutaneous nephrostomy catheters. Electronically Signed   By: Sandi Mariscal M.D.   On: 05/03/2017 09:40   Results for Donald Townsend, Donald Townsend (MRN [id number]) as of 04/10/2017 09:46  Ref. Range 01/01/2017 16:21 01/16/2017 16:15 02/06/2017 08:20 02/18/2017 08:02 03/16/2017 04:37  CEA Latest Ref Range: 0.0 - 4.7 ng/mL  132.4 (H) 138.0 (H) 160.8 (H) 79.3 (H)  Prostate Specific Ag, Serum Latest Ref Range: 0.0 - 4.0 ng/mL 0.2       ASSESSMENT & PLAN:   Rectal cancer ([place]) # RECURRENT RECTAL CANCER STAGE IV- [s/p LN Bx]; currently on 5FU infusion-IRI cycle # 6- tolerating better.    #  CT Jan 2019- partial response-improvement of the supraclavicular adenopathy mediastinal adenopathy retroperitoneal adenopathy.  Stable to slightly smaller pelvic necrotic mass. CEA improving.   # Proceed with FOLFIRI # 7 today [ irinotecan-150 mg/m square; 5-FU 2200 mg/m square-]. Labs today reviewed;  acceptable for treatment today.    #  CKD- secondary to bilateral hydronephrosis from the underlying pelvic mass. S/p PNC bil. Renal function improving/stabilizing with creatinine at 1.4-STABLE.   # Back pain- secondary to pelvic mass/PCN; continue hydrocodone as needed  # Left LE swollen compared to right- Hx of DVT-chronic; on Eliquis-not significantly worse.   # DM-2 ; on insulin/ amaryl; On Lantus. Better controlled.  # follow up in 2 weeks/labs;FOLFIRI/labs/CEA;awaiting omniseq- discussed with Dr.Baker       Cammie Sickle, MD 05/28/2017 6:41 PM

## 2017-05-22 NOTE — Telephone Encounter (Signed)
Notified pt of nephrostomy tube exchange due approx. 06/14/2017. Will notify pt when appt has been scheduled. Also notified pt of script sent to pharmacy by Dr Erlene Quan with dosing instructions. Pt voices understanding & has no further questions at this time.

## 2017-05-23 LAB — CEA: CEA1: 10 ng/mL — AB (ref 0.0–4.7)

## 2017-05-24 ENCOUNTER — Inpatient Hospital Stay: Payer: Medicare Other

## 2017-05-24 VITALS — BP 106/69 | HR 83 | Temp 97.5°F | Resp 18

## 2017-05-24 DIAGNOSIS — C20 Malignant neoplasm of rectum: Secondary | ICD-10-CM

## 2017-05-24 DIAGNOSIS — N189 Chronic kidney disease, unspecified: Secondary | ICD-10-CM | POA: Diagnosis not present

## 2017-05-24 DIAGNOSIS — R319 Hematuria, unspecified: Secondary | ICD-10-CM | POA: Diagnosis not present

## 2017-05-24 DIAGNOSIS — M549 Dorsalgia, unspecified: Secondary | ICD-10-CM | POA: Diagnosis not present

## 2017-05-24 DIAGNOSIS — Z5111 Encounter for antineoplastic chemotherapy: Secondary | ICD-10-CM | POA: Diagnosis not present

## 2017-05-24 DIAGNOSIS — G893 Neoplasm related pain (acute) (chronic): Secondary | ICD-10-CM | POA: Diagnosis not present

## 2017-05-24 MED ORDER — HEPARIN SOD (PORK) LOCK FLUSH 100 UNIT/ML IV SOLN
500.0000 [IU] | Freq: Once | INTRAVENOUS | Status: AC | PRN
  Administered 2017-05-24: 500 [IU]
  Filled 2017-05-24: qty 5

## 2017-05-24 MED ORDER — SODIUM CHLORIDE 0.9% FLUSH
10.0000 mL | INTRAVENOUS | Status: DC | PRN
  Administered 2017-05-24: 10 mL
  Filled 2017-05-24: qty 10

## 2017-05-30 ENCOUNTER — Encounter: Payer: Self-pay | Admitting: Internal Medicine

## 2017-05-30 NOTE — Telephone Encounter (Signed)
Notified both pt & daughter, Nira Conn, of nephrostomy tube exchange scheduled 06/14/2017. Advised pt to hold Eliquis x 2 days prior to procedure & reminded pt to take abx as previously instructed. Instructions given & both pt & daughter voice understanding with no further questions at this time.

## 2017-06-05 ENCOUNTER — Inpatient Hospital Stay: Payer: Medicare Other | Admitting: Internal Medicine

## 2017-06-05 ENCOUNTER — Inpatient Hospital Stay: Payer: Medicare Other

## 2017-06-10 ENCOUNTER — Other Ambulatory Visit: Payer: Self-pay | Admitting: Radiology

## 2017-06-10 DIAGNOSIS — N1330 Unspecified hydronephrosis: Secondary | ICD-10-CM

## 2017-06-10 DIAGNOSIS — N135 Crossing vessel and stricture of ureter without hydronephrosis: Secondary | ICD-10-CM

## 2017-06-10 NOTE — Progress Notes (Signed)
Ir

## 2017-06-14 ENCOUNTER — Ambulatory Visit
Admission: RE | Admit: 2017-06-14 | Discharge: 2017-06-14 | Disposition: A | Discharge status: Home / Self Care | Payer: Medicare Other | Source: Ambulatory Visit | Attending: Urology | Admitting: Urology

## 2017-06-14 ENCOUNTER — Other Ambulatory Visit (HOSPITAL_COMMUNITY): Payer: Self-pay | Admitting: Interventional Radiology

## 2017-06-14 ENCOUNTER — Encounter: Payer: Self-pay | Admitting: Interventional Radiology

## 2017-06-14 DIAGNOSIS — N1339 Other hydronephrosis: Secondary | ICD-10-CM

## 2017-06-14 DIAGNOSIS — Z436 Encounter for attention to other artificial openings of urinary tract: Secondary | ICD-10-CM | POA: Insufficient documentation

## 2017-06-14 DIAGNOSIS — N1330 Unspecified hydronephrosis: Secondary | ICD-10-CM

## 2017-06-14 DIAGNOSIS — N135 Crossing vessel and stricture of ureter without hydronephrosis: Secondary | ICD-10-CM | POA: Insufficient documentation

## 2017-06-14 DIAGNOSIS — Z85048 Personal history of other malignant neoplasm of rectum, rectosigmoid junction, and anus: Secondary | ICD-10-CM | POA: Insufficient documentation

## 2017-06-14 HISTORY — PX: IR NEPHROSTOMY EXCHANGE RIGHT: IMG6070

## 2017-06-14 HISTORY — PX: IR NEPHROSTOMY EXCHANGE LEFT: IMG6069

## 2017-06-14 MED ORDER — IOPAMIDOL (ISOVUE-300) INJECTION 61%
30.0000 mL | Freq: Once | INTRAVENOUS | Status: AC | PRN
  Administered 2017-06-14: 10 mL

## 2017-06-14 MED ORDER — LIDOCAINE HCL (PF) 1 % IJ SOLN
30.0000 mL | Freq: Once | INTRAMUSCULAR | Status: DC
  Filled 2017-06-14: qty 30

## 2017-06-14 MED ORDER — LIDOCAINE HCL (PF) 1 % IJ SOLN
INTRAMUSCULAR | Status: AC
  Filled 2017-06-14: qty 30

## 2017-06-16 ENCOUNTER — Telehealth: Payer: Self-pay | Admitting: Internal Medicine

## 2017-06-16 NOTE — Telephone Encounter (Signed)
Please check why pt has no appts with me or chemo? Looks like he missed his feb 6th appt.

## 2017-06-17 NOTE — Telephone Encounter (Signed)
Patient r/s his chemotherapy per his request. Pt has an apt tomorrow in [place] per scheduling team.

## 2017-06-18 ENCOUNTER — Inpatient Hospital Stay: Payer: Medicare Other

## 2017-06-18 ENCOUNTER — Encounter: Payer: Self-pay | Admitting: Internal Medicine

## 2017-06-18 ENCOUNTER — Inpatient Hospital Stay: Payer: Medicare Other | Attending: Internal Medicine

## 2017-06-18 ENCOUNTER — Inpatient Hospital Stay (HOSPITAL_BASED_OUTPATIENT_CLINIC_OR_DEPARTMENT_OTHER): Payer: Medicare Other | Admitting: Internal Medicine

## 2017-06-18 VITALS — BP 126/79 | HR 86 | Temp 97.8°F | Resp 16 | Wt 249.1 lb

## 2017-06-18 VITALS — BP 120/70 | HR 92 | Temp 97.0°F | Resp 18

## 2017-06-18 DIAGNOSIS — C20 Malignant neoplasm of rectum: Secondary | ICD-10-CM

## 2017-06-18 DIAGNOSIS — Z86718 Personal history of other venous thrombosis and embolism: Secondary | ICD-10-CM | POA: Diagnosis not present

## 2017-06-18 DIAGNOSIS — Z803 Family history of malignant neoplasm of breast: Secondary | ICD-10-CM | POA: Diagnosis not present

## 2017-06-18 DIAGNOSIS — Z7901 Long term (current) use of anticoagulants: Secondary | ICD-10-CM | POA: Diagnosis not present

## 2017-06-18 DIAGNOSIS — Z923 Personal history of irradiation: Secondary | ICD-10-CM

## 2017-06-18 DIAGNOSIS — Z5111 Encounter for antineoplastic chemotherapy: Secondary | ICD-10-CM | POA: Insufficient documentation

## 2017-06-18 DIAGNOSIS — N189 Chronic kidney disease, unspecified: Secondary | ICD-10-CM | POA: Insufficient documentation

## 2017-06-18 DIAGNOSIS — M549 Dorsalgia, unspecified: Secondary | ICD-10-CM | POA: Diagnosis not present

## 2017-06-18 DIAGNOSIS — Z8042 Family history of malignant neoplasm of prostate: Secondary | ICD-10-CM | POA: Diagnosis not present

## 2017-06-18 DIAGNOSIS — E119 Type 2 diabetes mellitus without complications: Secondary | ICD-10-CM | POA: Insufficient documentation

## 2017-06-18 DIAGNOSIS — G893 Neoplasm related pain (acute) (chronic): Secondary | ICD-10-CM | POA: Diagnosis not present

## 2017-06-18 LAB — CBC WITH DIFFERENTIAL/PLATELET
BASOS ABS: 0.1 10*3/uL (ref 0–0.1)
BASOS PCT: 1 %
Eosinophils Absolute: 0.5 10*3/uL (ref 0–0.7)
Eosinophils Relative: 5 %
HCT: 37.5 % — ABNORMAL LOW (ref 40.0–52.0)
Hemoglobin: 13 g/dL (ref 13.0–18.0)
LYMPHS PCT: 9 %
Lymphs Abs: 1 10*3/uL (ref 1.0–3.6)
MCH: 30.1 pg (ref 26.0–34.0)
MCHC: 34.6 g/dL (ref 32.0–36.0)
MCV: 87.1 fL (ref 80.0–100.0)
Monocytes Absolute: 0.8 10*3/uL (ref 0.2–1.0)
Monocytes Relative: 8 %
NEUTROS ABS: 8.4 10*3/uL — AB (ref 1.4–6.5)
Neutrophils Relative %: 77 %
PLATELETS: 263 10*3/uL (ref 150–440)
RBC: 4.3 MIL/uL — AB (ref 4.40–5.90)
RDW: 15.5 % — ABNORMAL HIGH (ref 11.5–14.5)
WBC: 10.9 10*3/uL — AB (ref 3.8–10.6)

## 2017-06-18 LAB — COMPREHENSIVE METABOLIC PANEL
ALT: 16 U/L — ABNORMAL LOW (ref 17–63)
ANION GAP: 8 (ref 5–15)
AST: 22 U/L (ref 15–41)
Albumin: 3.5 g/dL (ref 3.5–5.0)
Alkaline Phosphatase: 70 U/L (ref 38–126)
BILIRUBIN TOTAL: 0.7 mg/dL (ref 0.3–1.2)
BUN: 28 mg/dL — AB (ref 6–20)
CALCIUM: 8.7 mg/dL — AB (ref 8.9–10.3)
CO2: 25 mmol/L (ref 22–32)
Chloride: 102 mmol/L (ref 101–111)
Creatinine, Ser: 1.45 mg/dL — ABNORMAL HIGH (ref 0.61–1.24)
GFR calc Af Amer: 56 mL/min — ABNORMAL LOW (ref >60)
GFR, EST NON AFRICAN AMERICAN: 49 mL/min — AB (ref >60)
Glucose, Bld: 234 mg/dL — ABNORMAL HIGH (ref 65–99)
POTASSIUM: 4 mmol/L (ref 3.5–5.1)
Sodium: 135 mmol/L (ref 135–145)
TOTAL PROTEIN: 7.7 g/dL (ref 6.5–8.1)

## 2017-06-18 MED ORDER — SODIUM CHLORIDE 0.9% FLUSH
10.0000 mL | INTRAVENOUS | Status: DC | PRN
  Administered 2017-06-18: 10 mL
  Filled 2017-06-18: qty 10

## 2017-06-18 MED ORDER — DEXTROSE 5 % IV SOLN
150.0000 mg/m2 | Freq: Once | INTRAVENOUS | Status: AC
  Administered 2017-06-18: 360 mg via INTRAVENOUS
  Filled 2017-06-18: qty 18

## 2017-06-18 MED ORDER — LEUCOVORIN CALCIUM INJECTION 350 MG
950.0000 mg | Freq: Once | INTRAVENOUS | Status: AC
  Administered 2017-06-18: 950 mg via INTRAVENOUS
  Filled 2017-06-18: qty 47.5

## 2017-06-18 MED ORDER — SODIUM CHLORIDE 0.9 % IV SOLN
Freq: Once | INTRAVENOUS | Status: AC
  Administered 2017-06-18: 10:00:00 via INTRAVENOUS
  Filled 2017-06-18: qty 1000

## 2017-06-18 MED ORDER — HEPARIN SOD (PORK) LOCK FLUSH 100 UNIT/ML IV SOLN: 500.0000 [IU] | Freq: Once | INTRAVENOUS | Status: DC | PRN

## 2017-06-18 MED ORDER — FLUOROURACIL CHEMO INJECTION 5 GM/100ML
2200.0000 mg/m2 | INTRAVENOUS | Status: DC
  Administered 2017-06-18: 5250 mg via INTRAVENOUS
  Filled 2017-06-18: qty 105

## 2017-06-18 MED ORDER — ATROPINE SULFATE 1 MG/ML IJ SOLN
0.5000 mg | Freq: Once | INTRAMUSCULAR | Status: AC | PRN
  Administered 2017-06-18: 0.5 mg via INTRAVENOUS
  Filled 2017-06-18: qty 1

## 2017-06-18 MED ORDER — PALONOSETRON HCL INJECTION 0.25 MG/5ML
0.2500 mg | Freq: Once | INTRAVENOUS | Status: AC
  Administered 2017-06-18: 0.25 mg via INTRAVENOUS
  Filled 2017-06-18: qty 5

## 2017-06-18 NOTE — Assessment & Plan Note (Addendum)
#   RECURRENT RECTAL CANCER STAGE IV- [s/p LN Bx]; currently on 5FU infusion-IRI cycle # 7 tolerating better.    #  CT Jan 2019- partial response-improvement of the supraclavicular adenopathy mediastinal adenopathy retroperitoneal adenopathy.  Stable to slightly smaller pelvic necrotic mass. CEA improving ~10.   # Proceed with FOLFIRI # 7 today  [irinotecan-150 mg/m square; 5-FU 2200 mg/m square-]. Labs today reviewed;  acceptable for treatment today.   #  CKD- secondary to bilateral hydronephrosis from the underlying pelvic mass. S/p PNC-recent change of tubes.  Bil.creatinine at 1.4-STABLE.   # Back pain- secondary to pelvic mass/PCN; continue hydrocodone as needed. STABLE.   # Left LE swollen compared to right- Hx of DVT-chronic; on Eliquis-not significantly worse.   # DM-2 ; on insulin/ amaryl; On Lantus. Better controlled- today 234.   # follow up in 2 weeks/labs;FOLFIRI/labs/CEA.

## 2017-06-18 NOTE — Progress Notes (Signed)
[facility] NOTE  Patient Care Team: Dion Body, MD as PCP - General (Family Medicine) Clent Jacks, RN as Registered Nurse  CHIEF COMPLAINTS/PURPOSE OF CONSULTATION: RECTAL CANCER  #  Oncology History   # 2012- RECTO-SIGMOID-poorly diff STAGE III CA [s/p neo-adj chemo-RT- poor response]; APR [residual ypT3; 5/8 LN positiveDr.Smith/Dr.Pandit]  S/p FOLFOX   # SEP 2019- Pre-sacral fluid/mass- increasing ~ 5-6 cm [increased from dec 2017; also NEW retroperitoneal lymph nodes; inguinal adenopathy]; SEP 2018- left supraclavicular; mediastinal; right hilar-retroperitoneal; pelvic mass/node; bilateral inguinal adenopathy; s/p right Ingiunal LNBx - RECURRENT CA [necrotic;insuff tissue for F-One]  # OCT 8th 2018-FOLFIRI [first cycle 5FU alone]; OCT 22nd FOLFIRI  #   ------------------------------  ---  OCT-NOV 2018-Sepsis x2 sec to UTI [s/p ABx;]  # s/p RT rectal [11/28-last treatment]  # Acute renal insuff [s/p bil PCN]; hematuria- sec to bladder invol [? Contra-indication to avastin]  # LEFT LE DVT [? [facility]- Dr.Schneir]; Eliquis.   # CHF/COPD/CKD/ PN sec to [place]  # Foundation One-PDL-1/ TPS- 0% [2012-rectal specimen];12/26-Omniseq- K-RAS MUTATED;MSS; no targets**     Rectal cancer (HCC)    HISTORY OF PRESENTING ILLNESS:  Donald Townsend 67 y.o.  male  above History of recurrent metastatic rectal cancer currently on FOLFIRI status post cycle #6  is here for follow-up.  Patient canceled his appointment approximately 2 weeks ago because of "personal issues".  Is excited about his new grandbaby.  Denies any diarrhea. Denies any sores in the mouth.  No nausea no vomiting.  Appetite good.  No weight loss.  ROS: A complete 10 point review of system is done which is negative except mentioned above in history of present illness.   MEDICAL HISTORY:  Past Medical History:  Diagnosis Date  . A-fib ([place])   . Cancer [facility])    Colon  . CHF  (congestive heart failure) ([place])   . Diabetes mellitus without complication ([place])   . Dyspnea   . Elevated lipids   . Hypertension   . PVD (peripheral vascular disease) ([place])     SURGICAL HISTORY: Past Surgical History:  Procedure Laterality Date  . COLON RESECTION     with colostomy  . COLONOSCOPY    . CYSTOSCOPY WITH STENT PLACEMENT Bilateral 01/18/2017   Procedure: CYSTOSCOPY WITH STENT PLACEMENT;  Surgeon: [name], MD;  Location: [facility] ORS;  Service: Urology;  Laterality: Bilateral;  . IR FLUORO GUIDE PORT INSERTION RIGHT  01/24/2017  . IR NEPHROSTOMY EXCHANGE LEFT  03/14/2017  . IR NEPHROSTOMY EXCHANGE LEFT  05/03/2017  . IR NEPHROSTOMY EXCHANGE LEFT  06/14/2017  . IR NEPHROSTOMY EXCHANGE RIGHT  03/14/2017  . IR NEPHROSTOMY EXCHANGE RIGHT  05/03/2017  . IR NEPHROSTOMY EXCHANGE RIGHT  06/14/2017  . IR NEPHROSTOMY PLACEMENT LEFT  01/19/2017  . IR NEPHROSTOMY PLACEMENT RIGHT  01/19/2017  . IVC FILTER REMOVAL N/A 08/28/2016   Procedure: IVC Filter Removal;  Surgeon: Katha Cabal, MD;  Location: [place] CV LAB;  Service: Cardiovascular;  Laterality: N/A;  . LOWER EXTREMITY VENOGRAPHY Left 12/18/2016   Procedure: Lower Extremity Venography;  Surgeon: Katha Cabal, MD;  Location: [place] [facility];  Service: Cardiovascular;  Laterality: Left;  . PERIPHERAL VASCULAR CATHETERIZATION Left 05/08/2016   Procedure: Lower Extremity Venography;  Surgeon: Katha Cabal, MD;  Location: [place] [facility];  Service: Cardiovascular;  Laterality: Left;    SOCIAL HISTORY: [place]; retdOccupational psychologist; currently works part time. He lives alone. No smoking occasional alcohol.  Social History   Socioeconomic History  . Marital status: Divorced    Spouse name: Not on file  . Number of children: Not on file  . Years of education: Not on file  . Highest education level: Not on file  Social Needs  . Financial resource strain: Not on file  . Food insecurity - worry: Not on  file  . Food insecurity - inability: Not on file  . Transportation needs - medical: Not on file  . Transportation needs - non-medical: Not on file  Occupational History  . Not on file  Tobacco Use  . Smoking status: Never Smoker  . Smokeless tobacco: Never Used  Substance and Sexual Activity  . Alcohol use: No  . Drug use: No  . Sexual activity: Not on file  Other Topics Concern  . Not on file  Social History Narrative  . Not on file    FAMILY HISTORY: Family History  Problem Relation Age of Onset  . Diabetes Mother   . Stroke Mother   . Prostate cancer Brother   . Cancer Sister        Breast cancer  . Kidney cancer Neg Hx   . Bladder Cancer Neg Hx     ALLERGIES:  has No Known Allergies.  MEDICATIONS:  Current Outpatient Medications  Medication Sig Dispense Refill  . acidophilus (RISAQUAD) CAPS capsule Take 1 capsule daily by mouth. 30 capsule 0  . B-D ULTRAFINE III SHORT PEN 31G X 8 MM MISC     . ciprofloxacin (CIPRO) 250 MG tablet Take 1 tablet (250 mg total) by mouth 2 (two) times daily. Take starting the day before nephrostomy tube exchange 6 tablet 0  . Continuous Blood Gluc Receiver (FREESTYLE LIBRE READER) DEVI Use 1 Units as directed. Check CBG's four times daily. Dx: E11.9    . Continuous Blood Gluc Sensor (FREESTYLE LIBRE SENSOR SYSTEM) MISC Use 1 Units as directed. Check CBG's four times daily. Dx: E11.9    . cyclobenzaprine (FEXMID) 7.5 MG tablet Take 1 tablet (7.5 mg total) 3 (three) times daily as needed by mouth for muscle spasms. 30 tablet 0  . digoxin (LANOXIN) 0.25 MG tablet Take 0.25 mg by mouth daily.     [name] ELIQUIS 5 MG TABS tablet Take 5 mg by mouth every 12 (twelve) hours.  0  . feeding supplement, GLUCERNA SHAKE, ([place]) LIQD Follow manufacturer instructions    . furosemide (LASIX) 40 MG tablet Take 40 mg by mouth daily.   0  . glimepiride (AMARYL) 4 MG tablet Take 1 tablet by mouth daily.    [name] HYDROcodone-acetaminophen (NORCO/VICODIN)  5-325 MG tablet Take 1 tablet by mouth every 6 (six) hours as needed for moderate pain. 90 tablet 0  . insulin aspart (NOVOLOG) 100 UNIT/ML FlexPen Inject 7 Units into the skin 3 (three) times daily with meals.     . Lancets 30G MISC Use 1 Units as directed. Check CBG's up to twice daily. Dx: E11.9    . LANTUS SOLOSTAR 100 UNIT/ML Solostar Pen Inject 20 Units into the skin at bedtime.   0  . losartan (COZAAR) 100 MG tablet Take 100 mg by mouth daily.    . metoprolol tartrate (LOPRESSOR) 25 MG tablet Take 25 mg by mouth 2 (two) times daily.     . ondansetron (ZOFRAN) 8 MG tablet 1 pill every 8 hours for nausea/vomitting as needed 40 tablet 2  . pravastatin (PRAVACHOL) 20 MG tablet take 78m by mouth daily at bedtime    .  prochlorperazine (COMPAZINE) 10 MG tablet Take 1 tablet (10 mg total) by mouth every 6 (six) hours as needed for nausea or vomiting. 40 tablet 1   No current facility-administered medications for this visit.    Facility-Administered Medications Ordered in Other Visits  Medication Dose Route Frequency Provider Last Rate Last Dose  . fluorouracil (ADRUCIL) 5,250 mg in sodium chloride 0.9 % 145 mL chemo infusion  2,200 mg/m2 (Treatment Plan Recorded) Intravenous 1 day or 1 dose Charlaine Dalton R, MD   5,250 mg at 06/18/17 1251  . heparin lock flush 100 unit/mL  500 Units Intracatheter Once PRN Charlaine Dalton R, MD      . sodium chloride flush (NS) 0.9 % injection 10 mL  10 mL Intracatheter PRN Cammie Sickle, MD   10 mL at 06/18/17 0900      .  PHYSICAL EXAMINATION: ECOG PERFORMANCE STATUS: 0 - Asymptomatic  Vitals:   06/18/17 0926  BP: 126/79  Pulse: 86  Resp: 16  Temp: 97.8 F (36.6 C)   Filed Weights   06/18/17 0926  Weight: 249 lb 1.9 oz (113 kg)    GENERAL: Well-nourished well-developed; Alert, no distress and comfortable.  He is alone. EYES: no pallor or icterus OROPHARYNX: no thrush or ulceration; good dentition  NECK: supple, no masses  felt LYMPH:  no palpable lymphadenopathy in the cervical, axillary or inguinal regions LUNGS: clear to auscultation and  No wheeze or crackles HEART/CVS: regular rate & rhythm and no murmurs; No lower extremity edema ABDOMEN: abdomen soft, non-tender and normal bowel sounds; colostomy in place. Bilateral nephrostomy tubes in place. Musculoskeletal:no cyanosis of digits and no clubbing  PSYCH: alert & oriented x 3 with fluent speech NEURO: no focal motor/sensory deficits SKIN:  no rashes or significant lesions  LABORATORY DATA:  I have reviewed the data as listed Lab Results  Component Value Date   WBC 10.9 (H) 06/18/2017   HGB 13.0 06/18/2017   HCT 37.5 (L) 06/18/2017   MCV 87.1 06/18/2017   PLT 263 06/18/2017   Recent Labs    05/08/17 0820 05/22/17 0846 06/18/17 0909  NA 136 134* 135  K 3.8 3.9 4.0  CL 103 105 102  CO2 '24 23 25  ' GLUCOSE 229* 174* 234*  BUN 26* 20 28*  CREATININE 1.54* 1.45* 1.45*  CALCIUM 8.7* 8.6* 8.7*  GFRNONAA 45* 49* 49*  GFRAA 53* 56* 56*  PROT 6.8 6.8 7.7  ALBUMIN 3.3* 3.5 3.5  AST '17 19 22  ' ALT 16* 14* 16*  ALKPHOS 73 61 70  BILITOT 0.8 0.5 0.7    RADIOGRAPHIC STUDIES: I have personally reviewed the radiological images as listed and agreed with the findings in the report. Ir Nephrostomy Exchange Left  Result Date: 06/14/2017 INDICATION: History of recurrent metastatic rectal carcinoma and bilateral ureteral obstruction requiring chronic bilateral nephrostomy tubes. Routine nephrostomy tube exchange performed. EXAM: EXCHANGE OF BILATERAL PERCUTANEOUS NEPHROSTOMY TUBES COMPARISON:  05/03/2017 MEDICATIONS: None ANESTHESIA/SEDATION: None CONTRAST:  10 mL Isovue-300-administered into the collecting system(s) FLUOROSCOPY TIME:  Fluoroscopy Time: 48 seconds.  14 mGy. COMPLICATIONS: None immediate. PROCEDURE: Informed written consent was obtained from the patient after a thorough discussion of the procedural risks, benefits and alternatives. All  questions were addressed. Maximal Sterile Barrier Technique was utilized including caps, mask, sterile gowns, sterile gloves, sterile drape, hand hygiene and skin antiseptic. A timeout was performed prior to the initiation of the procedure. Bilateral nephrostomy tubes were injected with contrast material, cut and removed over guidewires. New 10  French catheters were advanced and formed. Catheter positioning was confirmed by fluoroscopic spot images obtained after injection of contrast. Both tubes were attached to new gravity drainage bags and secured at the skin with Prolene retention sutures. FINDINGS: Bilateral catheters were exchanged and positioned at the level of the renal pelvis bilaterally. IMPRESSION: Exchange of bilateral indwelling 10 French percutaneous nephrostomy tubes. Electronically Signed   By: Aletta Edouard M.D.   On: 06/14/2017 08:41   Ir Nephrostomy Exchange Right  Result Date: 06/14/2017 INDICATION: History of recurrent metastatic rectal carcinoma and bilateral ureteral obstruction requiring chronic bilateral nephrostomy tubes. Routine nephrostomy tube exchange performed. EXAM: EXCHANGE OF BILATERAL PERCUTANEOUS NEPHROSTOMY TUBES COMPARISON:  05/03/2017 MEDICATIONS: None ANESTHESIA/SEDATION: None CONTRAST:  10 mL Isovue-300-administered into the collecting system(s) FLUOROSCOPY TIME:  Fluoroscopy Time: 48 seconds.  14 mGy. COMPLICATIONS: None immediate. PROCEDURE: Informed written consent was obtained from the patient after a thorough discussion of the procedural risks, benefits and alternatives. All questions were addressed. Maximal Sterile Barrier Technique was utilized including caps, mask, sterile gowns, sterile gloves, sterile drape, hand hygiene and skin antiseptic. A timeout was performed prior to the initiation of the procedure. Bilateral nephrostomy tubes were injected with contrast material, cut and removed over guidewires. New 10 French catheters were advanced and formed.  Catheter positioning was confirmed by fluoroscopic spot images obtained after injection of contrast. Both tubes were attached to new gravity drainage bags and secured at the skin with Prolene retention sutures. FINDINGS: Bilateral catheters were exchanged and positioned at the level of the renal pelvis bilaterally. IMPRESSION: Exchange of bilateral indwelling 10 French percutaneous nephrostomy tubes. Electronically Signed   By: Aletta Edouard M.D.   On: 06/14/2017 08:41   Results for BURCH, MARCHUK (MRN [id number]) as of 06/18/2017 09:50  Ref. Range 04/13/2011 09:42 01/11/2012 10:55 04/04/2012 09:46 11/07/2012 11:16 08/04/2014 15:35 01/01/2017 16:21 01/16/2017 16:15 02/06/2017 08:20 02/18/2017 08:02 03/16/2017 04:37 04/10/2017 08:55 04/24/2017 08:27 05/22/2017 08:46  CEA Latest Ref Range: 0.0 - 4.7 ng/mL 1.8 3.2 2.4 3.1 2.2          CEA Latest Ref Range: 0.0 - 4.7 ng/mL       132.4 (H) 138.0 (H) 160.8 (H) 79.3 (H) 93.2 (H) 53.4 (H) 10.0 (H)  Prostate Specific Ag, Serum Latest Ref Range: 0.0 - 4.0 ng/mL      0.2           ASSESSMENT & PLAN:   Rectal cancer ([place]) # RECURRENT RECTAL CANCER STAGE IV- [s/p LN Bx]; currently on 5FU infusion-IRI cycle # 7 tolerating better.    #  CT Jan 2019- partial response-improvement of the supraclavicular adenopathy mediastinal adenopathy retroperitoneal adenopathy.  Stable to slightly smaller pelvic necrotic mass. CEA improving ~10.   # Proceed with FOLFIRI # 7 today  [irinotecan-150 mg/m square; 5-FU 2200 mg/m square-]. Labs today reviewed;  acceptable for treatment today.   #  CKD- secondary to bilateral hydronephrosis from the underlying pelvic mass. S/p PNC-recent change of tubes.  Bil.creatinine at 1.4-STABLE.   # Back pain- secondary to pelvic mass/PCN; continue hydrocodone as needed. STABLE.   # Left LE swollen compared to right- Hx of DVT-chronic; on Eliquis-not significantly worse.   # DM-2 ; on insulin/ amaryl; On Lantus. Better controlled- today 234.   #  follow up in 2 weeks/labs;FOLFIRI/labs/CEA.      Cammie Sickle, MD 06/18/2017 4:23 PM

## 2017-06-19 ENCOUNTER — Encounter: Payer: Self-pay | Admitting: Radiation Oncology

## 2017-06-19 ENCOUNTER — Ambulatory Visit
Admission: RE | Admit: 2017-06-19 | Discharge: 2017-06-19 | Disposition: A | Discharge status: Home / Self Care | Payer: Medicare Other | Source: Ambulatory Visit | Attending: Radiation Oncology | Admitting: Radiation Oncology

## 2017-06-19 ENCOUNTER — Other Ambulatory Visit: Payer: Self-pay

## 2017-06-19 VITALS — BP 118/75 | HR 79 | Temp 96.8°F | Resp 20 | Wt 254.3 lb

## 2017-06-19 DIAGNOSIS — Z923 Personal history of irradiation: Secondary | ICD-10-CM | POA: Insufficient documentation

## 2017-06-19 DIAGNOSIS — C778 Secondary and unspecified malignant neoplasm of lymph nodes of multiple regions: Secondary | ICD-10-CM | POA: Diagnosis not present

## 2017-06-19 DIAGNOSIS — C20 Malignant neoplasm of rectum: Secondary | ICD-10-CM | POA: Diagnosis not present

## 2017-06-19 DIAGNOSIS — Z9221 Personal history of antineoplastic chemotherapy: Secondary | ICD-10-CM | POA: Diagnosis not present

## 2017-06-19 LAB — CEA: CEA: 5.9 ng/mL — ABNORMAL HIGH (ref 0.0–4.7)

## 2017-06-19 NOTE — Progress Notes (Signed)
Radiation Oncology Follow up Note  Name: Donald Townsend   Date:   06/19/2017 MRN:  [id number] DOB: July 20, 1950    This 67 y.o. male presents to the clinic today for two-month follow-up status post palliative radiation therapy to his pelvis for recurrent rectal cancer.  REFERRING PROVIDER: Dion Body, MD  HPI: Patient is a 67 year old male presented with recurrent rectal cancer with tumor impinging on the lateral wall. We treated with short palliative course of radiation therapy to thousand centigrays over 2 weeks when he developed hematuria. Patient did have a history of new adjuvant chemoradiation prior surgical resection back in 2012 limiting our ultimate dose. He is seen today in follow-up and is doing well. He is currently on. FOLFIRI and tolerating that well. He specifically denies pelvic pain or hematuria. He had a CT scan back in January showing response to treatment with supraclavicular mediastinal retroperitoneal and bilateral pelvic adenopathy decreased. The mixed attenuation the presacral mass was also decreased.    COMPLICATIONS OF TREATMENT: none  FOLLOW UP COMPLIANCE: keeps appointments   PHYSICAL EXAM:  BP 118/75   Pulse 79   Temp (!) 96.8 F (36 C)   Resp 20   Wt 254 lb 4.8 oz (115.4 kg)   BMI 35.97 kg/m  Well-developed well-nourished patient in NAD. HEENT reveals PERLA, EOMI, discs not visualized.  Oral cavity is clear. No oral mucosal lesions are identified. Neck is clear without evidence of cervical or supraclavicular adenopathy. Lungs are clear to A&P. Cardiac examination is essentially unremarkable with regular rate and rhythm without murmur rub or thrill. Abdomen is benign with no organomegaly or masses noted. Motor sensory and DTR levels are equal and symmetric in the upper and lower extremities. Cranial nerves II through XII are grossly intact. Proprioception is intact. No peripheral adenopathy or edema is identified. No motor or sensory levels are noted.  Crude visual fields are within normal range.  RADIOLOGY RESULTS: CT scan is reviewed and compatible with the above-stated findings  PLAN: Present time patient is doing well. I will turn follow-up care over to medical oncology. I would be happy to reevaluate this patient at any time should further palliative treatment be indicated. We have achieved good palliative response with decreased pain and hematuria. Patient is to call with any concerns.  I would like to take this opportunity to thank you for allowing me to participate in the care of your patient.Noreene Filbert, MD

## 2017-06-20 ENCOUNTER — Inpatient Hospital Stay: Payer: Medicare Other

## 2017-06-20 VITALS — BP 105/68 | HR 81 | Temp 96.8°F | Resp 20

## 2017-06-20 DIAGNOSIS — Z5111 Encounter for antineoplastic chemotherapy: Secondary | ICD-10-CM | POA: Diagnosis not present

## 2017-06-20 DIAGNOSIS — G893 Neoplasm related pain (acute) (chronic): Secondary | ICD-10-CM | POA: Diagnosis not present

## 2017-06-20 DIAGNOSIS — M549 Dorsalgia, unspecified: Secondary | ICD-10-CM | POA: Diagnosis not present

## 2017-06-20 DIAGNOSIS — C20 Malignant neoplasm of rectum: Secondary | ICD-10-CM | POA: Diagnosis not present

## 2017-06-20 DIAGNOSIS — E119 Type 2 diabetes mellitus without complications: Secondary | ICD-10-CM | POA: Diagnosis not present

## 2017-06-20 DIAGNOSIS — N189 Chronic kidney disease, unspecified: Secondary | ICD-10-CM | POA: Diagnosis not present

## 2017-06-20 MED ORDER — HEPARIN SOD (PORK) LOCK FLUSH 100 UNIT/ML IV SOLN
500.0000 [IU] | Freq: Once | INTRAVENOUS | Status: AC | PRN
  Administered 2017-06-20: 500 [IU]
  Filled 2017-06-20: qty 5

## 2017-06-20 MED ORDER — SODIUM CHLORIDE 0.9% FLUSH
10.0000 mL | INTRAVENOUS | Status: DC | PRN
  Administered 2017-06-20: 10 mL
  Filled 2017-06-20: qty 10

## 2017-07-03 ENCOUNTER — Encounter: Payer: Self-pay | Admitting: Internal Medicine

## 2017-07-03 ENCOUNTER — Inpatient Hospital Stay: Payer: Medicare Other | Attending: Internal Medicine | Admitting: Internal Medicine

## 2017-07-03 ENCOUNTER — Inpatient Hospital Stay: Payer: Medicare Other

## 2017-07-03 ENCOUNTER — Other Ambulatory Visit: Payer: Medicare Other

## 2017-07-03 DIAGNOSIS — C778 Secondary and unspecified malignant neoplasm of lymph nodes of multiple regions: Secondary | ICD-10-CM | POA: Diagnosis not present

## 2017-07-03 DIAGNOSIS — C20 Malignant neoplasm of rectum: Secondary | ICD-10-CM

## 2017-07-03 DIAGNOSIS — Z79899 Other long term (current) drug therapy: Secondary | ICD-10-CM

## 2017-07-03 DIAGNOSIS — N1330 Unspecified hydronephrosis: Secondary | ICD-10-CM | POA: Insufficient documentation

## 2017-07-03 DIAGNOSIS — Z5111 Encounter for antineoplastic chemotherapy: Secondary | ICD-10-CM | POA: Diagnosis not present

## 2017-07-03 DIAGNOSIS — Z794 Long term (current) use of insulin: Secondary | ICD-10-CM | POA: Diagnosis not present

## 2017-07-03 DIAGNOSIS — N189 Chronic kidney disease, unspecified: Secondary | ICD-10-CM | POA: Diagnosis not present

## 2017-07-03 DIAGNOSIS — G893 Neoplasm related pain (acute) (chronic): Secondary | ICD-10-CM

## 2017-07-03 DIAGNOSIS — Z8042 Family history of malignant neoplasm of prostate: Secondary | ICD-10-CM

## 2017-07-03 DIAGNOSIS — M7989 Other specified soft tissue disorders: Secondary | ICD-10-CM | POA: Diagnosis not present

## 2017-07-03 DIAGNOSIS — Z86718 Personal history of other venous thrombosis and embolism: Secondary | ICD-10-CM

## 2017-07-03 DIAGNOSIS — E119 Type 2 diabetes mellitus without complications: Secondary | ICD-10-CM

## 2017-07-03 DIAGNOSIS — Z803 Family history of malignant neoplasm of breast: Secondary | ICD-10-CM

## 2017-07-03 DIAGNOSIS — R1900 Intra-abdominal and pelvic swelling, mass and lump, unspecified site: Secondary | ICD-10-CM | POA: Insufficient documentation

## 2017-07-03 DIAGNOSIS — M549 Dorsalgia, unspecified: Secondary | ICD-10-CM | POA: Diagnosis not present

## 2017-07-03 DIAGNOSIS — Z7901 Long term (current) use of anticoagulants: Secondary | ICD-10-CM

## 2017-07-03 LAB — COMPREHENSIVE METABOLIC PANEL
ALBUMIN: 3.4 g/dL — AB (ref 3.5–5.0)
ALT: 15 U/L — ABNORMAL LOW (ref 17–63)
ANION GAP: 9 (ref 5–15)
AST: 17 U/L (ref 15–41)
Alkaline Phosphatase: 65 U/L (ref 38–126)
BUN: 24 mg/dL — AB (ref 6–20)
CHLORIDE: 104 mmol/L (ref 101–111)
CO2: 23 mmol/L (ref 22–32)
Calcium: 8.9 mg/dL (ref 8.9–10.3)
Creatinine, Ser: 1.31 mg/dL — ABNORMAL HIGH (ref 0.61–1.24)
GFR calc Af Amer: >60 mL/min (ref >60)
GFR calc non Af Amer: 55 mL/min — ABNORMAL LOW (ref >60)
GLUCOSE: 204 mg/dL — AB (ref 65–99)
POTASSIUM: 3.5 mmol/L (ref 3.5–5.1)
SODIUM: 136 mmol/L (ref 135–145)
Total Bilirubin: 0.5 mg/dL (ref 0.3–1.2)
Total Protein: 6.8 g/dL (ref 6.5–8.1)

## 2017-07-03 LAB — CBC WITH DIFFERENTIAL/PLATELET
BASOS ABS: 0 10*3/uL (ref 0–0.1)
BASOS PCT: 1 %
EOS ABS: 0.3 10*3/uL (ref 0–0.7)
Eosinophils Relative: 5 %
HCT: 35.8 % — ABNORMAL LOW (ref 40.0–52.0)
HEMOGLOBIN: 12 g/dL — AB (ref 13.0–18.0)
Lymphocytes Relative: 12 %
Lymphs Abs: 0.7 10*3/uL — ABNORMAL LOW (ref 1.0–3.6)
MCH: 29.5 pg (ref 26.0–34.0)
MCHC: 33.6 g/dL (ref 32.0–36.0)
MCV: 87.9 fL (ref 80.0–100.0)
MONO ABS: 0.5 10*3/uL (ref 0.2–1.0)
MONOS PCT: 8 %
NEUTROS ABS: 4.7 10*3/uL (ref 1.4–6.5)
NEUTROS PCT: 76 %
Platelets: 211 10*3/uL (ref 150–440)
RBC: 4.07 MIL/uL — ABNORMAL LOW (ref 4.40–5.90)
RDW: 14.8 % — AB (ref 11.5–14.5)
WBC: 6.2 10*3/uL (ref 3.8–10.6)

## 2017-07-03 MED ORDER — IRINOTECAN HCL CHEMO INJECTION 100 MG/5ML
150.0000 mg/m2 | Freq: Once | INTRAVENOUS | Status: AC
  Administered 2017-07-03: 360 mg via INTRAVENOUS
  Filled 2017-07-03: qty 10

## 2017-07-03 MED ORDER — PALONOSETRON HCL INJECTION 0.25 MG/5ML
0.2500 mg | Freq: Once | INTRAVENOUS | Status: AC
  Administered 2017-07-03: 0.25 mg via INTRAVENOUS
  Filled 2017-07-03: qty 5

## 2017-07-03 MED ORDER — HEPARIN SOD (PORK) LOCK FLUSH 100 UNIT/ML IV SOLN
500.0000 [IU] | Freq: Once | INTRAVENOUS | Status: DC | PRN
  Filled 2017-07-03: qty 5

## 2017-07-03 MED ORDER — SODIUM CHLORIDE 0.9 % IV SOLN
Freq: Once | INTRAVENOUS | Status: AC
  Administered 2017-07-03: 11:00:00 via INTRAVENOUS
  Filled 2017-07-03: qty 1000

## 2017-07-03 MED ORDER — ATROPINE SULFATE 1 MG/ML IJ SOLN
0.5000 mg | Freq: Once | INTRAMUSCULAR | Status: AC | PRN
  Administered 2017-07-03: 0.5 mg via INTRAVENOUS
  Filled 2017-07-03: qty 1

## 2017-07-03 MED ORDER — DEXTROSE 5 % IV SOLN
950.0000 mg | Freq: Once | INTRAVENOUS | Status: AC
  Administered 2017-07-03: 950 mg via INTRAVENOUS
  Filled 2017-07-03: qty 47.5

## 2017-07-03 MED ORDER — SODIUM CHLORIDE 0.9 % IV SOLN
5250.0000 mg | INTRAVENOUS | Status: DC
  Administered 2017-07-03: 5250 mg via INTRAVENOUS
  Filled 2017-07-03: qty 100

## 2017-07-03 NOTE — Assessment & Plan Note (Addendum)
#   RECURRENT RECTAL CANCER STAGE IV- [s/p LN Bx]; currently on 5FU infusion-IRI cycle # 8 tolerating better.    #  CT Jan 2019- partial response-improvement of the supraclavicular adenopathy mediastinal adenopathy retroperitoneal adenopathy.  Stable to slightly smaller pelvic necrotic mass. CEA improving ~5.9; will order scans at next visit.   # Proceed with FOLFIRI # 9 today  [irinotecan-150 mg/m square; 5-FU 2200 mg/m square-]. Labs today reviewed;  acceptable for treatment today.   #  CKD- secondary to bilateral hydronephrosis from the underlying pelvic mass. S/p PNC-recent change of tubes.  Bil.creatinine at 1.4-STABLE.   # Back pain- secondary to pelvic mass/PCN; continue hydrocodone as needed. STABLE.   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   # DM-2 ; on insulin/ amaryl; On Lantus. Better controlled.   # follow up in 2 weeks/labs;FOLFIRI/labs/CEA.  Will order scan at next visit

## 2017-07-03 NOTE — Progress Notes (Signed)
[name] NOTE  Patient Care Team: Dion Body, MD as PCP - General (Family Medicine) Clent Jacks, RN as Registered Nurse  CHIEF COMPLAINTS/PURPOSE OF CONSULTATION: RECTAL CANCER  #  Oncology History   # 2012- RECTO-SIGMOID-poorly diff STAGE III CA [s/p neo-adj chemo-RT- poor response]; APR [residual ypT3; 5/8 LN positiveDr.Smith/Dr.Pandit]  S/p FOLFOX   # SEP 2019- Pre-sacral fluid/mass- increasing ~ 5-6 cm [increased from dec 2017; also NEW retroperitoneal lymph nodes; inguinal adenopathy]; SEP 2018- left supraclavicular; mediastinal; right hilar-retroperitoneal; pelvic mass/node; bilateral inguinal adenopathy; s/p right Ingiunal LNBx - RECURRENT CA [necrotic;insuff tissue for F-One]  # OCT 8th 2018-FOLFIRI [first cycle 5FU alone]; OCT 22nd FOLFIRI  #   ------------------------------  ---  OCT-NOV 2018-Sepsis x2 sec to UTI [s/p ABx;]  # s/p RT rectal [11/28-last treatment]  # Acute renal insuff [s/p bil PCN]; hematuria- sec to bladder invol [? Contra-indication to avastin]  # LEFT LE DVT [? [facility]- Dr.Schneir]; Eliquis.   # CHF/COPD/CKD/ PN sec to [place]  # [facility] One-PDL-1/ TPS- 0% [2012-rectal specimen];12/26-Omniseq- K-RAS MUTATED;MSS; no targets**     Rectal cancer (HCC)    HISTORY OF PRESENTING ILLNESS:  Donald Townsend 67 y.o.  male  above History of recurrent metastatic rectal cancer currently on FOLFIRI status post cycle #8 is here for follow-up.  Patient continues to be fairly active; continues to work on his farm. Denies any diarrhea. Denies any sores in the mouth.  No nausea no vomiting.  Appetite good.  No weight loss.  ROS: A complete 10 point review of system is done which is negative except mentioned above in history of present illness.   MEDICAL HISTORY:  Past Medical History:  Diagnosis Date  . A-fib ([place])   . Cancer [facility])    Colon  . CHF (congestive heart failure) ([place])   . Diabetes mellitus without  complication ([place])   . Dyspnea   . Elevated lipids   . Hypertension   . PVD (peripheral vascular disease) ([place])     SURGICAL HISTORY: Past Surgical History:  Procedure Laterality Date  . COLON RESECTION     with colostomy  . COLONOSCOPY    . CYSTOSCOPY WITH STENT PLACEMENT Bilateral 01/18/2017   Procedure: CYSTOSCOPY WITH STENT PLACEMENT;  Surgeon: [name], MD;  Location: [facility] ORS;  Service: Urology;  Laterality: Bilateral;  . IR FLUORO GUIDE PORT INSERTION RIGHT  01/24/2017  . IR NEPHROSTOMY EXCHANGE LEFT  03/14/2017  . IR NEPHROSTOMY EXCHANGE LEFT  05/03/2017  . IR NEPHROSTOMY EXCHANGE LEFT  06/14/2017  . IR NEPHROSTOMY EXCHANGE RIGHT  03/14/2017  . IR NEPHROSTOMY EXCHANGE RIGHT  05/03/2017  . IR NEPHROSTOMY EXCHANGE RIGHT  06/14/2017  . IR NEPHROSTOMY PLACEMENT LEFT  01/19/2017  . IR NEPHROSTOMY PLACEMENT RIGHT  01/19/2017  . IVC FILTER REMOVAL N/A 08/28/2016   Procedure: IVC Filter Removal;  Surgeon: Katha Cabal, MD;  Location: [place] [facility];  Service: Cardiovascular;  Laterality: N/A;  . LOWER EXTREMITY VENOGRAPHY Left 12/18/2016   Procedure: Lower Extremity Venography;  Surgeon: Katha Cabal, MD;  Location: [place] [facility];  Service: Cardiovascular;  Laterality: Left;  . PERIPHERAL VASCULAR CATHETERIZATION Left 05/08/2016   Procedure: Lower Extremity Venography;  Surgeon: Katha Cabal, MD;  Location: [place] [facility];  Service: Cardiovascular;  Laterality: Left;    SOCIAL HISTORY: [place]; retdOccupational psychologist; currently works part time. He lives alone. No smoking occasional alcohol. Social History   Socioeconomic History  . Marital  status: Divorced    Spouse name: Not on file  . Number of children: Not on file  . Years of education: Not on file  . Highest education level: Not on file  Social Needs  . Financial resource strain: Not on file  . Food insecurity - worry: Not on file  . Food insecurity - inability: Not on file  .  Transportation needs - medical: Not on file  . Transportation needs - non-medical: Not on file  Occupational History  . Not on file  Tobacco Use  . Smoking status: Never Smoker  . Smokeless tobacco: Never Used  Substance and Sexual Activity  . Alcohol use: No  . Drug use: No  . Sexual activity: Not on file  Other Topics Concern  . Not on file  Social History Narrative  . Not on file    FAMILY HISTORY: Family History  Problem Relation Age of Onset  . Diabetes Mother   . Stroke Mother   . Prostate cancer Brother   . Cancer Sister        Breast cancer  . Kidney cancer Neg Hx   . Bladder Cancer Neg Hx     ALLERGIES:  has No Known Allergies.  MEDICATIONS:  Current Outpatient Medications  Medication Sig Dispense Refill  . acidophilus (RISAQUAD) CAPS capsule Take 1 capsule daily by mouth. 30 capsule 0  . B-D ULTRAFINE III SHORT PEN 31G X 8 MM MISC     . Continuous Blood Gluc Receiver (FREESTYLE LIBRE READER) DEVI Use 1 Units as directed. Check CBG's four times daily. Dx: E11.9    . Continuous Blood Gluc Sensor (FREESTYLE LIBRE SENSOR SYSTEM) MISC Use 1 Units as directed. Check CBG's four times daily. Dx: E11.9    . cyclobenzaprine (FEXMID) 7.5 MG tablet Take 1 tablet (7.5 mg total) 3 (three) times daily as needed by mouth for muscle spasms. 30 tablet 0  . digoxin (LANOXIN) 0.25 MG tablet Take 0.25 mg by mouth daily.     [name] ELIQUIS 5 MG TABS tablet Take 5 mg by mouth every 12 (twelve) hours.  0  . feeding supplement, GLUCERNA SHAKE, ([place]) LIQD Follow manufacturer instructions    . furosemide (LASIX) 40 MG tablet Take 40 mg by mouth daily.   0  . glimepiride (AMARYL) 4 MG tablet Take 1 tablet by mouth daily.    [name] HYDROcodone-acetaminophen (NORCO/VICODIN) 5-325 MG tablet Take 1 tablet by mouth every 6 (six) hours as needed for moderate pain. 90 tablet 0  . insulin aspart (NOVOLOG) 100 UNIT/ML FlexPen Inject 7 Units into the skin 3 (three) times daily with meals.     .  Lancets 30G MISC Use 1 Units as directed. Check CBG's up to twice daily. Dx: E11.9    . LANTUS SOLOSTAR 100 UNIT/ML Solostar Pen Inject 20 Units into the skin at bedtime.   0  . losartan (COZAAR) 100 MG tablet Take 100 mg by mouth daily.    . metoprolol tartrate (LOPRESSOR) 25 MG tablet Take 25 mg by mouth 2 (two) times daily.     . ondansetron (ZOFRAN) 8 MG tablet 1 pill every 8 hours for nausea/vomitting as needed 40 tablet 2  . pravastatin (PRAVACHOL) 20 MG tablet take 18m by mouth daily at bedtime    . prochlorperazine (COMPAZINE) 10 MG tablet Take 1 tablet (10 mg total) by mouth every 6 (six) hours as needed for nausea or vomiting. 40 tablet 1  . ciprofloxacin (CIPRO) 250 MG tablet Take 1  tablet (250 mg total) by mouth 2 (two) times daily. Take starting the day before nephrostomy tube exchange (Patient not taking: Reported on 06/19/2017) 6 tablet 0   No current facility-administered medications for this visit.    Facility-Administered Medications Ordered in Other Visits  Medication Dose Route Frequency Provider Last Rate Last Dose  . fluorouracil (ADRUCIL) 5,250 mg in sodium chloride 0.9 % 145 mL chemo infusion  5,250 mg Intravenous 1 day or 1 dose Charlaine Dalton R, MD      . heparin lock flush 100 unit/mL  500 Units Intracatheter Once PRN Cammie Sickle, MD      . irinotecan (CAMPTOSAR) 360 mg in dextrose 5 % 500 mL chemo infusion  150 mg/m2 (Treatment Plan Recorded) Intravenous Once Cammie Sickle, MD 345 mL/hr at 07/03/17 1149 360 mg at 07/03/17 1149  . leucovorin 950 mg in dextrose 5 % 250 mL infusion  950 mg Intravenous Once Charlaine Dalton R, MD 149 mL/hr at 07/03/17 1149 950 mg at 07/03/17 1149      .  PHYSICAL EXAMINATION: ECOG PERFORMANCE STATUS: 0 - Asymptomatic  Vitals:   07/03/17 1035  BP: 120/60  Pulse: 78  Resp: 16  Temp: (!) 97.3 F (36.3 C)   Filed Weights   07/03/17 1035  Weight: 254 lb 3.2 oz (115.3 kg)    GENERAL: Well-nourished  well-developed; Alert, no distress and comfortable.  He is alone. EYES: no pallor or icterus OROPHARYNX: no thrush or ulceration; good dentition  NECK: supple, no masses felt LYMPH:  no palpable lymphadenopathy in the cervical, axillary or inguinal regions LUNGS: clear to auscultation and  No wheeze or crackles HEART/CVS: regular rate & rhythm and no murmurs; No lower extremity edema ABDOMEN: abdomen soft, non-tender and normal bowel sounds; colostomy in place. Bilateral nephrostomy tubes in place. Musculoskeletal:no cyanosis of digits and no clubbing  PSYCH: alert & oriented x 3 with fluent speech NEURO: no focal motor/sensory deficits SKIN:  no rashes or significant lesions  LABORATORY DATA:  I have reviewed the data as listed Lab Results  Component Value Date   WBC 6.2 07/03/2017   HGB 12.0 (L) 07/03/2017   HCT 35.8 (L) 07/03/2017   MCV 87.9 07/03/2017   PLT 211 07/03/2017   Recent Labs    05/22/17 0846 06/18/17 0909 07/03/17 0957  NA 134* 135 136  K 3.9 4.0 3.5  CL 105 102 104  CO2 '23 25 23  ' GLUCOSE 174* 234* 204*  BUN 20 28* 24*  CREATININE 1.45* 1.45* 1.31*  CALCIUM 8.6* 8.7* 8.9  GFRNONAA 49* 49* 55*  GFRAA 56* 56* >60  PROT 6.8 7.7 6.8  ALBUMIN 3.5 3.5 3.4*  AST '19 22 17  ' ALT 14* 16* 15*  ALKPHOS 61 70 65  BILITOT 0.5 0.7 0.5    RADIOGRAPHIC STUDIES: I have personally reviewed the radiological images as listed and agreed with the findings in the report. Ir Nephrostomy Exchange Left  Result Date: 06/14/2017 INDICATION: History of recurrent metastatic rectal carcinoma and bilateral ureteral obstruction requiring chronic bilateral nephrostomy tubes. Routine nephrostomy tube exchange performed. EXAM: EXCHANGE OF BILATERAL PERCUTANEOUS NEPHROSTOMY TUBES COMPARISON:  05/03/2017 MEDICATIONS: None ANESTHESIA/SEDATION: None CONTRAST:  10 mL Isovue-300-administered into the collecting system(s) FLUOROSCOPY TIME:  Fluoroscopy Time: 48 seconds.  14 mGy. COMPLICATIONS:  None immediate. PROCEDURE: Informed written consent was obtained from the patient after a thorough discussion of the procedural risks, benefits and alternatives. All questions were addressed. Maximal Sterile Barrier Technique was utilized including caps, mask, sterile  gowns, sterile gloves, sterile drape, hand hygiene and skin antiseptic. A timeout was performed prior to the initiation of the procedure. Bilateral nephrostomy tubes were injected with contrast material, cut and removed over guidewires. New 10 French catheters were advanced and formed. Catheter positioning was confirmed by fluoroscopic spot images obtained after injection of contrast. Both tubes were attached to new gravity drainage bags and secured at the skin with Prolene retention sutures. FINDINGS: Bilateral catheters were exchanged and positioned at the level of the renal pelvis bilaterally. IMPRESSION: Exchange of bilateral indwelling 10 French percutaneous nephrostomy tubes. Electronically Signed   By: Aletta Edouard M.D.   On: 06/14/2017 08:41   Ir Nephrostomy Exchange Right  Result Date: 06/14/2017 INDICATION: History of recurrent metastatic rectal carcinoma and bilateral ureteral obstruction requiring chronic bilateral nephrostomy tubes. Routine nephrostomy tube exchange performed. EXAM: EXCHANGE OF BILATERAL PERCUTANEOUS NEPHROSTOMY TUBES COMPARISON:  05/03/2017 MEDICATIONS: None ANESTHESIA/SEDATION: None CONTRAST:  10 mL Isovue-300-administered into the collecting system(s) FLUOROSCOPY TIME:  Fluoroscopy Time: 48 seconds.  14 mGy. COMPLICATIONS: None immediate. PROCEDURE: Informed written consent was obtained from the patient after a thorough discussion of the procedural risks, benefits and alternatives. All questions were addressed. Maximal Sterile Barrier Technique was utilized including caps, mask, sterile gowns, sterile gloves, sterile drape, hand hygiene and skin antiseptic. A timeout was performed prior to the initiation of the  procedure. Bilateral nephrostomy tubes were injected with contrast material, cut and removed over guidewires. New 10 French catheters were advanced and formed. Catheter positioning was confirmed by fluoroscopic spot images obtained after injection of contrast. Both tubes were attached to new gravity drainage bags and secured at the skin with Prolene retention sutures. FINDINGS: Bilateral catheters were exchanged and positioned at the level of the renal pelvis bilaterally. IMPRESSION: Exchange of bilateral indwelling 10 French percutaneous nephrostomy tubes. Electronically Signed   By: Aletta Edouard M.D.   On: 06/14/2017 08:41   Results for FILIPPO, PULS (MRN [id number]) as of 07/03/2017 10:48  Ref. Range 01/16/2017 16:15 02/06/2017 08:20 02/18/2017 08:02 03/16/2017 04:37 04/10/2017 08:55 04/24/2017 08:27 05/22/2017 08:46 06/18/2017 09:09  CEA Latest Ref Range: 0.0 - 4.7 ng/mL 132.4 (H) 138.0 (H) 160.8 (H) 79.3 (H) 93.2 (H) 53.4 (H) 10.0 (H) 5.9 (H)     ASSESSMENT & PLAN:   Rectal cancer ([place]) # RECURRENT RECTAL CANCER STAGE IV- [s/p LN Bx]; currently on 5FU infusion-IRI cycle # 8 tolerating better.    #  CT Jan 2019- partial response-improvement of the supraclavicular adenopathy mediastinal adenopathy retroperitoneal adenopathy.  Stable to slightly smaller pelvic necrotic mass. CEA improving ~5.9; will order scans at next visit.   # Proceed with FOLFIRI # 9 today  [irinotecan-150 mg/m square; 5-FU 2200 mg/m square-]. Labs today reviewed;  acceptable for treatment today.   #  CKD- secondary to bilateral hydronephrosis from the underlying pelvic mass. S/p PNC-recent change of tubes.  Bil.creatinine at 1.4-STABLE.   # Back pain- secondary to pelvic mass/PCN; continue hydrocodone as needed. STABLE.   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   # DM-2 ; on insulin/ amaryl; On Lantus. Better controlled.   # follow up in 2 weeks/labs;FOLFIRI/labs/CEA.  Will order  scan at next visit     Cammie Sickle, MD 07/03/2017 12:56 PM

## 2017-07-04 LAB — CEA: CEA1: 4.7 ng/mL (ref 0.0–4.7)

## 2017-07-05 ENCOUNTER — Inpatient Hospital Stay: Payer: Medicare Other

## 2017-07-05 VITALS — BP 121/75 | HR 90 | Temp 98.0°F | Resp 18

## 2017-07-05 DIAGNOSIS — C20 Malignant neoplasm of rectum: Secondary | ICD-10-CM

## 2017-07-05 DIAGNOSIS — N189 Chronic kidney disease, unspecified: Secondary | ICD-10-CM | POA: Diagnosis not present

## 2017-07-05 DIAGNOSIS — R1900 Intra-abdominal and pelvic swelling, mass and lump, unspecified site: Secondary | ICD-10-CM | POA: Diagnosis not present

## 2017-07-05 DIAGNOSIS — Z5111 Encounter for antineoplastic chemotherapy: Secondary | ICD-10-CM | POA: Diagnosis not present

## 2017-07-05 DIAGNOSIS — C778 Secondary and unspecified malignant neoplasm of lymph nodes of multiple regions: Secondary | ICD-10-CM | POA: Diagnosis not present

## 2017-07-05 DIAGNOSIS — N1330 Unspecified hydronephrosis: Secondary | ICD-10-CM | POA: Diagnosis not present

## 2017-07-05 MED ORDER — SODIUM CHLORIDE 0.9% FLUSH
10.0000 mL | INTRAVENOUS | Status: DC | PRN
  Administered 2017-07-05: 10 mL via INTRAVENOUS
  Filled 2017-07-05: qty 10

## 2017-07-05 MED ORDER — HEPARIN SOD (PORK) LOCK FLUSH 100 UNIT/ML IV SOLN
500.0000 [IU] | Freq: Once | INTRAVENOUS | Status: AC
  Administered 2017-07-05: 500 [IU] via INTRAVENOUS
  Filled 2017-07-05: qty 5

## 2017-07-08 ENCOUNTER — Other Ambulatory Visit: Payer: Self-pay | Admitting: Radiology

## 2017-07-08 ENCOUNTER — Ambulatory Visit (INDEPENDENT_AMBULATORY_CARE_PROVIDER_SITE_OTHER): Payer: Medicare Other | Admitting: Vascular Surgery

## 2017-07-08 ENCOUNTER — Telehealth: Payer: Self-pay | Admitting: Radiology

## 2017-07-08 ENCOUNTER — Encounter: Payer: Self-pay | Admitting: Radiology

## 2017-07-08 ENCOUNTER — Encounter (INDEPENDENT_AMBULATORY_CARE_PROVIDER_SITE_OTHER): Payer: Medicare Other

## 2017-07-08 ENCOUNTER — Encounter (INDEPENDENT_AMBULATORY_CARE_PROVIDER_SITE_OTHER): Payer: Self-pay | Admitting: Vascular Surgery

## 2017-07-08 VITALS — BP 104/67 | HR 71 | Resp 17 | Ht 70.5 in | Wt 250.0 lb

## 2017-07-08 DIAGNOSIS — E119 Type 2 diabetes mellitus without complications: Secondary | ICD-10-CM | POA: Diagnosis not present

## 2017-07-08 DIAGNOSIS — I871 Compression of vein: Secondary | ICD-10-CM

## 2017-07-08 DIAGNOSIS — N1330 Unspecified hydronephrosis: Secondary | ICD-10-CM

## 2017-07-08 DIAGNOSIS — C20 Malignant neoplasm of rectum: Secondary | ICD-10-CM

## 2017-07-08 DIAGNOSIS — N135 Crossing vessel and stricture of ureter without hydronephrosis: Secondary | ICD-10-CM

## 2017-07-08 DIAGNOSIS — I482 Chronic atrial fibrillation, unspecified: Secondary | ICD-10-CM

## 2017-07-08 DIAGNOSIS — Z86718 Personal history of other venous thrombosis and embolism: Secondary | ICD-10-CM

## 2017-07-08 DIAGNOSIS — Z9189 Other specified personal risk factors, not elsewhere classified: Secondary | ICD-10-CM

## 2017-07-08 MED ORDER — CIPROFLOXACIN HCL 250 MG PO TABS: 250.0000 mg | ORAL_TABLET | Freq: Two times a day (BID) | ORAL | 0 refills | Status: DC

## 2017-07-08 NOTE — Telephone Encounter (Signed)
Notified pt of bilateral nephrostomy tube exchange scheduled 07/26/2017 @7 :30, to stop Eliquis 2 days prior to procedure & script for Cipro sent to pharmacy. Instructions given & pt voices understanding. Asks for a message to be sent via MyChart as well. This will be done.

## 2017-07-08 NOTE — Progress Notes (Signed)
MRN : [id number]  Donald Townsend is a 67 y.o. (12/07/1950) male who presents with chief complaint of No chief complaint on file. [name]  History of Present Illness:   The patient presents to the office for evaluation of DVT.  DVT was identified at [facility] by Duplex ultrasound.  The initial symptoms were pain and swelling in the lower extremity.  He is s/p intervention on 12/18/2016 and he reports his left leg continues to be better wit minimal swelling and no pain.  Procedure(s) Performed:             1.  Ultrasound-guided access to the left superficial femoral vein             2.  Percutaneous transluminal angioplasty of the left external iliac vein to 12 mm             3.  Percutaneous transluminal angioplasty of the left common femoral vein to 14 mm  Symptoms are much better with elevation.  The patient notes minimal edema in the morning which increases slightly throughout the day.    The patient has not been using compression therapy at this point.  No SOB or pleuritic chest pains.  No cough or hemoptysis.  No blood per rectum or blood in any sputum.  No excessive bruising per the patient.  He is currently undergoing treatment for RECURRENT RECTAL CANCER STAGE IV- [s/p LN Bx]; currently on 5FU infusion-IRI cycle # 8 tolerating better    No outpatient medications have been marked as taking for the 07/08/17 encounter (Appointment) with Delana Meyer, Dolores Lory, MD.    Past Medical History:  Diagnosis Date  . A-fib ([place])   . Cancer [facility])    Colon  . CHF (congestive heart failure) ([place])   . Diabetes mellitus without complication ([place])   . Dyspnea   . Elevated lipids   . Hypertension   . PVD (peripheral vascular disease) ([place])     Past Surgical History:  Procedure Laterality Date  . COLON RESECTION     with colostomy  . COLONOSCOPY    . CYSTOSCOPY WITH STENT PLACEMENT Bilateral 01/18/2017   Procedure: CYSTOSCOPY WITH STENT PLACEMENT;  Surgeon: [name], MD;   Location: [facility] ORS;  Service: Urology;  Laterality: Bilateral;  . IR FLUORO GUIDE PORT INSERTION RIGHT  01/24/2017  . IR NEPHROSTOMY EXCHANGE LEFT  03/14/2017  . IR NEPHROSTOMY EXCHANGE LEFT  05/03/2017  . IR NEPHROSTOMY EXCHANGE LEFT  06/14/2017  . IR NEPHROSTOMY EXCHANGE RIGHT  03/14/2017  . IR NEPHROSTOMY EXCHANGE RIGHT  05/03/2017  . IR NEPHROSTOMY EXCHANGE RIGHT  06/14/2017  . IR NEPHROSTOMY PLACEMENT LEFT  01/19/2017  . IR NEPHROSTOMY PLACEMENT RIGHT  01/19/2017  . IVC FILTER REMOVAL N/A 08/28/2016   Procedure: IVC Filter Removal;  Surgeon: Katha Cabal, MD;  Location: [place] CV [facility];  Service: Cardiovascular;  Laterality: N/A;  . LOWER EXTREMITY VENOGRAPHY Left 12/18/2016   Procedure: Lower Extremity Venography;  Surgeon: Katha Cabal, MD;  Location: [place] [facility];  Service: Cardiovascular;  Laterality: Left;  . PERIPHERAL VASCULAR CATHETERIZATION Left 05/08/2016   Procedure: Lower Extremity Venography;  Surgeon: Katha Cabal, MD;  Location: [place] [facility];  Service: Cardiovascular;  Laterality: Left;    Social History Social History   Tobacco Use  . Smoking status: Never Smoker  . Smokeless tobacco: Never Used  Substance Use Topics  . Alcohol use: No  . Drug use: No    Family History  Family History  Problem Relation Age of Onset  . Diabetes Mother   . Stroke Mother   . Prostate cancer Brother   . Cancer Sister        Breast cancer  . Kidney cancer Neg Hx   . Bladder Cancer Neg Hx     No Known Allergies   REVIEW OF SYSTEMS (Negative unless checked)  Constitutional: [] Weight loss  [] Fever  [] Chills Cardiac: [] Chest pain   [] Chest pressure   [] Palpitations   [] Shortness of breath when laying flat   [] Shortness of breath with exertion. Vascular:  [] Pain in legs with walking   [x] Pain in legs with standing  [x] History of DVT   [] Phlebitis   [x] Swelling in legs   [] Varicose veins   [] Non-healing ulcers Pulmonary:   [] Uses home oxygen    [] Productive cough   [] Hemoptysis   [] Wheeze  [] COPD   [] Asthma Neurologic:  [] Dizziness   [] Seizures   [] History of stroke   [] History of TIA  [] Aphasia   [] Vissual changes   [] Weakness or numbness in arm   [] Weakness or numbness in leg Musculoskeletal:   [] Joint swelling   [] Joint pain   [] Low back pain Hematologic:  [] Easy bruising  [] Easy bleeding   [] Hypercoagulable state   [] Anemic Gastrointestinal:  [] Diarrhea   [] Vomiting  [] Gastroesophageal reflux/heartburn   [] Difficulty swallowing. Genitourinary:  [] Chronic kidney disease   [] Difficult urination  [] Frequent urination   [] Blood in urine Skin:  [] Rashes   [] Ulcers  Psychological:  [] History of anxiety   []  History of major depression.  Physical Examination  There were no vitals filed for this visit. There is no height or weight on file to calculate BMI. Gen: WD/WN, NAD Head: [place]/AT, No temporalis wasting.  Ear/Nose/Throat: Hearing grossly intact, nares w/o erythema or drainage Eyes: PER, EOMI, sclera nonicteric.  Neck: Supple, no large masses.   Pulmonary:  Good air movement, no audible wheezing bilaterally, no use of accessory muscles.  Cardiac: RRR, no JVD Vascular: scattered varicosities present bilaterally.  Mild venous stasis changes to the legs bilaterally.  2+ soft pitting edema left leg Gastrointestinal: Non-distended. No guarding/no peritoneal signs.  Musculoskeletal: M/S 5/5 throughout.  No deformity or atrophy.  Neurologic: CN 2-12 intact. Symmetrical.  Speech is fluent. Motor exam as listed above. Psychiatric: Judgment intact, Mood & affect appropriate for pt's clinical situation. Dermatologic: No rashes or ulcers noted.  No changes consistent with cellulitis. Lymph : No lichenification or skin changes of chronic lymphedema.  CBC Lab Results  Component Value Date   WBC 6.2 07/03/2017   HGB 12.0 (L) 07/03/2017   HCT 35.8 (L) 07/03/2017   MCV 87.9 07/03/2017   PLT 211 07/03/2017    BMET    Component Value  Date/Time   NA 136 07/03/2017 0957   NA 139 12/17/2012 0608   K 3.5 07/03/2017 0957   K 3.8 12/17/2012 0608   CL 104 07/03/2017 0957   CL 108 (H) 12/17/2012 0608   CO2 23 07/03/2017 0957   CO2 26 12/17/2012 0608   GLUCOSE 204 (H) 07/03/2017 0957   GLUCOSE 217 (H) 12/17/2012 0608   BUN 24 (H) 07/03/2017 0957   BUN 21 01/01/2017 1621   BUN 15 12/17/2012 0608   CREATININE 1.31 (H) 07/03/2017 0957   CREATININE 0.84 12/17/2012 0608   CALCIUM 8.9 07/03/2017 0957   CALCIUM 8.2 (L) 12/17/2012 0608   GFRNONAA 55 (L) 07/03/2017 0957   GFRNONAA >60 12/17/2012 6283   GFRAA >60 07/03/2017 1517  GFRAA >60 12/17/2012 0608   Estimated Creatinine Clearance: 71.1 mL/min (A) (by C-G formula based on SCr of 1.31 mg/dL (H)).  COAG Lab Results  Component Value Date   INR 1.31 01/31/2017   INR 1.11 01/24/2017   INR 1.19 01/19/2017    Radiology Ir Nephrostomy Exchange Left  Result Date: 06/14/2017 INDICATION: History of recurrent metastatic rectal carcinoma and bilateral ureteral obstruction requiring chronic bilateral nephrostomy tubes. Routine nephrostomy tube exchange performed. EXAM: EXCHANGE OF BILATERAL PERCUTANEOUS NEPHROSTOMY TUBES COMPARISON:  05/03/2017 MEDICATIONS: None ANESTHESIA/SEDATION: None CONTRAST:  10 mL Isovue-300-administered into the collecting system(s) FLUOROSCOPY TIME:  Fluoroscopy Time: 48 seconds.  14 mGy. COMPLICATIONS: None immediate. PROCEDURE: Informed written consent was obtained from the patient after a thorough discussion of the procedural risks, benefits and alternatives. All questions were addressed. Maximal Sterile Barrier Technique was utilized including caps, mask, sterile gowns, sterile gloves, sterile drape, hand hygiene and skin antiseptic. A timeout was performed prior to the initiation of the procedure. Bilateral nephrostomy tubes were injected with contrast material, cut and removed over guidewires. New 10 French catheters were advanced and formed. Catheter  positioning was confirmed by fluoroscopic spot images obtained after injection of contrast. Both tubes were attached to new gravity drainage bags and secured at the skin with Prolene retention sutures. FINDINGS: Bilateral catheters were exchanged and positioned at the level of the renal pelvis bilaterally. IMPRESSION: Exchange of bilateral indwelling 10 French percutaneous nephrostomy tubes. Electronically Signed   By: Aletta Edouard M.D.   On: 06/14/2017 08:41   Ir Nephrostomy Exchange Right  Result Date: 06/14/2017 INDICATION: History of recurrent metastatic rectal carcinoma and bilateral ureteral obstruction requiring chronic bilateral nephrostomy tubes. Routine nephrostomy tube exchange performed. EXAM: EXCHANGE OF BILATERAL PERCUTANEOUS NEPHROSTOMY TUBES COMPARISON:  05/03/2017 MEDICATIONS: None ANESTHESIA/SEDATION: None CONTRAST:  10 mL Isovue-300-administered into the collecting system(s) FLUOROSCOPY TIME:  Fluoroscopy Time: 48 seconds.  14 mGy. COMPLICATIONS: None immediate. PROCEDURE: Informed written consent was obtained from the patient after a thorough discussion of the procedural risks, benefits and alternatives. All questions were addressed. Maximal Sterile Barrier Technique was utilized including caps, mask, sterile gowns, sterile gloves, sterile drape, hand hygiene and skin antiseptic. A timeout was performed prior to the initiation of the procedure. Bilateral nephrostomy tubes were injected with contrast material, cut and removed over guidewires. New 10 French catheters were advanced and formed. Catheter positioning was confirmed by fluoroscopic spot images obtained after injection of contrast. Both tubes were attached to new gravity drainage bags and secured at the skin with Prolene retention sutures. FINDINGS: Bilateral catheters were exchanged and positioned at the level of the renal pelvis bilaterally. IMPRESSION: Exchange of bilateral indwelling 10 French percutaneous nephrostomy tubes.  Electronically Signed   By: Aletta Edouard M.D.   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 History of DVT (deep vein thrombosis) No surgery or intervention at this point in time.    I have had a long discussion with the patient regarding venous insufficiency and why it  causes symptoms. I have discussed with the patient the chronic skin changes that accompany venous insufficiency and the long term sequela such as infection and ulceration.  Patient reminded to wear graduated compression stockings class 1 (20-30 mmHg) or compression wraps on a daily basis a prescription was given. The patient will put the stockings on first thing in the morning and removing them in the evening.     In addition,  behavioral modification including several periods of elevation of the lower extremities during the day will be continued. I have demonstrated that proper elevation is a position with the ankles at heart level.  The patient is instructed to begin routine exercise, especially walking on a daily basis  Given his other medical conditions he will follow up with me PRN sooner if he notes an increase in edema/swelling of the leg  3. Chronic atrial fibrillation (HCC) Continue antiarrhythmia medications as already ordered, these medications have been reviewed and there are no changes at this time.  Continue anticoagulation as ordered by Cardiology Service   4. Rectal cancer ([place]) Continue chemo as arranged  5. Type 2 diabetes mellitus without complication, without long-term current use of insulin (HCC) Continue hypoglycemic medications as already ordered, these medications have been reviewed and there are no changes at this time.  Hgb A1C to be monitored as already arranged by primary service    Hortencia Pilar, MD  07/08/2017 12:56 PM

## 2017-07-17 ENCOUNTER — Inpatient Hospital Stay: Payer: Medicare Other

## 2017-07-17 ENCOUNTER — Encounter: Payer: Self-pay | Admitting: Internal Medicine

## 2017-07-17 ENCOUNTER — Inpatient Hospital Stay (HOSPITAL_BASED_OUTPATIENT_CLINIC_OR_DEPARTMENT_OTHER): Payer: Medicare Other | Admitting: Internal Medicine

## 2017-07-17 VITALS — BP 113/73 | HR 78 | Temp 97.9°F | Resp 16 | Wt 249.8 lb

## 2017-07-17 DIAGNOSIS — C778 Secondary and unspecified malignant neoplasm of lymph nodes of multiple regions: Secondary | ICD-10-CM | POA: Diagnosis not present

## 2017-07-17 DIAGNOSIS — R1900 Intra-abdominal and pelvic swelling, mass and lump, unspecified site: Secondary | ICD-10-CM | POA: Diagnosis not present

## 2017-07-17 DIAGNOSIS — Z86718 Personal history of other venous thrombosis and embolism: Secondary | ICD-10-CM | POA: Diagnosis not present

## 2017-07-17 DIAGNOSIS — M549 Dorsalgia, unspecified: Secondary | ICD-10-CM | POA: Diagnosis not present

## 2017-07-17 DIAGNOSIS — C20 Malignant neoplasm of rectum: Secondary | ICD-10-CM | POA: Diagnosis not present

## 2017-07-17 DIAGNOSIS — Z7901 Long term (current) use of anticoagulants: Secondary | ICD-10-CM

## 2017-07-17 DIAGNOSIS — M7989 Other specified soft tissue disorders: Secondary | ICD-10-CM | POA: Diagnosis not present

## 2017-07-17 DIAGNOSIS — N189 Chronic kidney disease, unspecified: Secondary | ICD-10-CM | POA: Diagnosis not present

## 2017-07-17 DIAGNOSIS — Z794 Long term (current) use of insulin: Secondary | ICD-10-CM | POA: Diagnosis not present

## 2017-07-17 DIAGNOSIS — N1330 Unspecified hydronephrosis: Secondary | ICD-10-CM

## 2017-07-17 DIAGNOSIS — Z5111 Encounter for antineoplastic chemotherapy: Secondary | ICD-10-CM | POA: Diagnosis not present

## 2017-07-17 DIAGNOSIS — E119 Type 2 diabetes mellitus without complications: Secondary | ICD-10-CM

## 2017-07-17 LAB — CBC WITH DIFFERENTIAL/PLATELET
Basophils Absolute: 0.1 10*3/uL (ref 0–0.1)
Basophils Relative: 1 %
Eosinophils Absolute: 0.3 10*3/uL (ref 0–0.7)
Eosinophils Relative: 4 %
HEMATOCRIT: 35.8 % — AB (ref 40.0–52.0)
HEMOGLOBIN: 12.5 g/dL — AB (ref 13.0–18.0)
LYMPHS ABS: 0.8 10*3/uL — AB (ref 1.0–3.6)
LYMPHS PCT: 12 %
MCH: 30.2 pg (ref 26.0–34.0)
MCHC: 35 g/dL (ref 32.0–36.0)
MCV: 86.3 fL (ref 80.0–100.0)
MONO ABS: 0.7 10*3/uL (ref 0.2–1.0)
MONOS PCT: 9 %
NEUTROS ABS: 5.2 10*3/uL (ref 1.4–6.5)
NEUTROS PCT: 74 %
Platelets: 203 10*3/uL (ref 150–440)
RBC: 4.15 MIL/uL — ABNORMAL LOW (ref 4.40–5.90)
RDW: 14.6 % — AB (ref 11.5–14.5)
WBC: 7.1 10*3/uL (ref 3.8–10.6)

## 2017-07-17 LAB — COMPREHENSIVE METABOLIC PANEL
ALK PHOS: 74 U/L (ref 38–126)
ALT: 14 U/L — ABNORMAL LOW (ref 17–63)
ANION GAP: 5 (ref 5–15)
AST: 16 U/L (ref 15–41)
Albumin: 3.5 g/dL (ref 3.5–5.0)
BILIRUBIN TOTAL: 0.7 mg/dL (ref 0.3–1.2)
BUN: 25 mg/dL — AB (ref 6–20)
CALCIUM: 8.8 mg/dL — AB (ref 8.9–10.3)
CO2: 26 mmol/L (ref 22–32)
Chloride: 104 mmol/L (ref 101–111)
Creatinine, Ser: 1.48 mg/dL — ABNORMAL HIGH (ref 0.61–1.24)
GFR calc Af Amer: 55 mL/min — ABNORMAL LOW (ref >60)
GFR, EST NON AFRICAN AMERICAN: 48 mL/min — AB (ref >60)
GLUCOSE: 187 mg/dL — AB (ref 65–99)
POTASSIUM: 3.5 mmol/L (ref 3.5–5.1)
Sodium: 135 mmol/L (ref 135–145)
TOTAL PROTEIN: 7.2 g/dL (ref 6.5–8.1)

## 2017-07-17 MED ORDER — LEUCOVORIN CALCIUM INJECTION 350 MG
950.0000 mg | Freq: Once | INTRAVENOUS | Status: AC
  Administered 2017-07-17: 950 mg via INTRAVENOUS
  Filled 2017-07-17: qty 47.5

## 2017-07-17 MED ORDER — IRINOTECAN HCL CHEMO INJECTION 100 MG/5ML
150.0000 mg/m2 | Freq: Once | INTRAVENOUS | Status: AC
  Administered 2017-07-17: 360 mg via INTRAVENOUS
  Filled 2017-07-17: qty 15

## 2017-07-17 MED ORDER — ATROPINE SULFATE 1 MG/ML IJ SOLN
0.5000 mg | Freq: Once | INTRAMUSCULAR | Status: AC | PRN
  Administered 2017-07-17: 0.5 mg via INTRAVENOUS
  Filled 2017-07-17: qty 1

## 2017-07-17 MED ORDER — SODIUM CHLORIDE 0.9 % IV SOLN
Freq: Once | INTRAVENOUS | Status: AC
Start: 2017-07-17 — End: 2017-07-17
  Administered 2017-07-17: 11:00:00 via INTRAVENOUS
  Filled 2017-07-17: qty 1000

## 2017-07-17 MED ORDER — SODIUM CHLORIDE 0.9 % IV SOLN
2200.0000 mg/m2 | INTRAVENOUS | Status: DC
  Administered 2017-07-17: 5250 mg via INTRAVENOUS
  Filled 2017-07-17: qty 105

## 2017-07-17 MED ORDER — PALONOSETRON HCL INJECTION 0.25 MG/5ML
0.2500 mg | Freq: Once | INTRAVENOUS | Status: AC
  Administered 2017-07-17: 0.25 mg via INTRAVENOUS
  Filled 2017-07-17: qty 5

## 2017-07-17 NOTE — Progress Notes (Signed)
[place] NOTE  Patient Care Team: Dion Body, MD as PCP - General (Family Medicine) Clent Jacks, RN as Registered Nurse  CHIEF COMPLAINTS/PURPOSE OF CONSULTATION: RECTAL CANCER  #  Oncology History   # 2012- RECTO-SIGMOID-poorly diff STAGE III CA [s/p neo-adj chemo-RT- poor response]; APR [residual ypT3; 5/8 LN positiveDr.Smith/Dr.Pandit]  S/p FOLFOX   # SEP 2019- Pre-sacral fluid/mass- increasing ~ 5-6 cm [increased from dec 2017; also NEW retroperitoneal lymph nodes; inguinal adenopathy]; SEP 2018- left supraclavicular; mediastinal; right hilar-retroperitoneal; pelvic mass/node; bilateral inguinal adenopathy; s/p right Ingiunal LNBx - RECURRENT CA [necrotic;insuff tissue for F-One]  # OCT 8th 2018-FOLFIRI [first cycle 5FU alone]; OCT 22nd FOLFIRI  #   ------------------------------  ---  OCT-NOV 2018-Sepsis x2 sec to UTI [s/p ABx;]  # s/p RT rectal [11/28-last treatment]  # Acute renal insuff [s/p bil PCN]; hematuria- sec to bladder invol [? Contra-indication to avastin]  # LEFT LE DVT [? May Thurner's- Dr.Schneir]; Eliquis.   # CHF/COPD/CKD/ PN sec to [place]  # Foundation One-PDL-1/ TPS- 0% [2012-rectal specimen];12/26-Omniseq- K-RAS MUTATED;MSS; no targets**     Rectal cancer (HCC)    HISTORY OF PRESENTING ILLNESS:  Donald Townsend 67 y.o.  male  above History of recurrent metastatic rectal cancer currently on FOLFIRI status post cycle # 9 is here for follow-up.  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  He does admit to intermittent "discharge" from his penis.  No blood. Denies any diarrhea. Denies any sores in the mouth.  No nausea no vomiting.  Appetite good.  No weight loss.  ROS: A complete 10 point review of system is done which is negative except mentioned above in history of present illness.   MEDICAL HISTORY:  Past Medical History:  Diagnosis Date  . A-fib ([place])   . Cancer [facility])    Colon   . CHF (congestive heart failure) ([place])   . Diabetes mellitus without complication ([place])   . Dyspnea   . Elevated lipids   . Hypertension   . PVD (peripheral vascular disease) ([place])     SURGICAL HISTORY: Past Surgical History:  Procedure Laterality Date  . COLON RESECTION     with colostomy  . COLONOSCOPY    . CYSTOSCOPY WITH STENT PLACEMENT Bilateral 01/18/2017   Procedure: CYSTOSCOPY WITH STENT PLACEMENT;  Surgeon: [name], MD;  Location: [facility] ORS;  Service: Urology;  Laterality: Bilateral;  . IR FLUORO GUIDE PORT INSERTION RIGHT  01/24/2017  . IR NEPHROSTOMY EXCHANGE LEFT  03/14/2017  . IR NEPHROSTOMY EXCHANGE LEFT  05/03/2017  . IR NEPHROSTOMY EXCHANGE LEFT  06/14/2017  . IR NEPHROSTOMY EXCHANGE RIGHT  03/14/2017  . IR NEPHROSTOMY EXCHANGE RIGHT  05/03/2017  . IR NEPHROSTOMY EXCHANGE RIGHT  06/14/2017  . IR NEPHROSTOMY PLACEMENT LEFT  01/19/2017  . IR NEPHROSTOMY PLACEMENT RIGHT  01/19/2017  . IVC FILTER REMOVAL N/A 08/28/2016   Procedure: IVC Filter Removal;  Surgeon: Katha Cabal, MD;  Location: [place] [facility];  Service: Cardiovascular;  Laterality: N/A;  . LOWER EXTREMITY VENOGRAPHY Left 12/18/2016   Procedure: Lower Extremity Venography;  Surgeon: Katha Cabal, MD;  Location: [place] [facility];  Service: Cardiovascular;  Laterality: Left;  . PERIPHERAL VASCULAR CATHETERIZATION Left 05/08/2016   Procedure: Lower Extremity Venography;  Surgeon: Katha Cabal, MD;  Location: [place] [facility];  Service: Cardiovascular;  Laterality: Left;    SOCIAL HISTORY: [place]; retdOccupational psychologist; currently works part time. He lives  alone. No smoking occasional alcohol. Social History   Socioeconomic History  . Marital status: Divorced    Spouse name: Not on file  . Number of children: Not on file  . Years of education: Not on file  . Highest education level: Not on file  Social Needs  . Financial resource strain: Not on file  . Food insecurity - worry:  Not on file  . Food insecurity - inability: Not on file  . Transportation needs - medical: Not on file  . Transportation needs - non-medical: Not on file  Occupational History  . Not on file  Tobacco Use  . Smoking status: Never Smoker  . Smokeless tobacco: Never Used  Substance and Sexual Activity  . Alcohol use: No  . Drug use: No  . Sexual activity: Not on file  Other Topics Concern  . Not on file  Social History Narrative  . Not on file    FAMILY HISTORY: Family History  Problem Relation Age of Onset  . Diabetes Mother   . Stroke Mother   . Prostate cancer Brother   . Cancer Sister        Breast cancer  . Kidney cancer Neg Hx   . Bladder Cancer Neg Hx     ALLERGIES:  has No Known Allergies.  MEDICATIONS:  Current Outpatient Medications  Medication Sig Dispense Refill  . B-D ULTRAFINE III SHORT PEN 31G X 8 MM MISC     . ciprofloxacin (CIPRO) 250 MG tablet Take 1 tablet (250 mg total) by mouth 2 (two) times daily. Take starting the day before nephrostomy tube exchange 6 tablet 0  . Continuous Blood Gluc Receiver (FREESTYLE LIBRE READER) DEVI Use 1 Units as directed. Check CBG's four times daily. Dx: E11.9    . Continuous Blood Gluc Sensor (FREESTYLE LIBRE SENSOR SYSTEM) MISC Use 1 Units as directed. Check CBG's four times daily. Dx: E11.9    . digoxin (LANOXIN) 0.25 MG tablet Take 0.25 mg by mouth daily.     [name] ELIQUIS 5 MG TABS tablet Take 5 mg by mouth every 12 (twelve) hours.  0  . feeding supplement, GLUCERNA SHAKE, ([place]) LIQD Follow manufacturer instructions    . furosemide (LASIX) 40 MG tablet Take 40 mg by mouth daily.   0  . glimepiride (AMARYL) 4 MG tablet Take 1 tablet by mouth daily.    [name] HYDROcodone-acetaminophen (NORCO/VICODIN) 5-325 MG tablet Take 1 tablet by mouth every 6 (six) hours as needed for moderate pain. 90 tablet 0  . insulin aspart (NOVOLOG) 100 UNIT/ML FlexPen Inject 7 Units into the skin 3 (three) times daily with meals.     .  Lancets 30G MISC Use 1 Units as directed. Check CBG's up to twice daily. Dx: E11.9    . LANTUS SOLOSTAR 100 UNIT/ML Solostar Pen Inject 20 Units into the skin at bedtime.   0  . losartan (COZAAR) 100 MG tablet Take 100 mg by mouth daily.    . metoprolol tartrate (LOPRESSOR) 25 MG tablet Take 25 mg by mouth 2 (two) times daily.     . ondansetron (ZOFRAN) 8 MG tablet 1 pill every 8 hours for nausea/vomitting as needed 40 tablet 2  . pravastatin (PRAVACHOL) 20 MG tablet take 65m by mouth daily at bedtime    . prochlorperazine (COMPAZINE) 10 MG tablet Take 1 tablet (10 mg total) by mouth every 6 (six) hours as needed for nausea or vomiting. 40 tablet 1  . acidophilus (RISAQUAD) CAPS capsule Take  1 capsule daily by mouth. (Patient not taking: Reported on 07/08/2017) 30 capsule 0  . cyclobenzaprine (FEXMID) 7.5 MG tablet Take 1 tablet (7.5 mg total) 3 (three) times daily as needed by mouth for muscle spasms. (Patient not taking: Reported on 07/08/2017) 30 tablet 0   No current facility-administered medications for this visit.       [name]  PHYSICAL EXAMINATION: ECOG PERFORMANCE STATUS: 0 - Asymptomatic  Vitals:   07/17/17 1002  BP: 113/73  Pulse: 78  Resp: 16  Temp: 97.9 F (36.6 C)   Filed Weights   07/17/17 0958 07/17/17 1002  Weight: 249 lb 12.8 oz (113.3 kg) 249 lb 12.8 oz (113.3 kg)    GENERAL: Well-nourished well-developed; Alert, no distress and comfortable.  He is alone. EYES: no pallor or icterus OROPHARYNX: no thrush or ulceration; good dentition  NECK: supple, no masses felt LYMPH:  no palpable lymphadenopathy in the cervical, axillary or inguinal regions LUNGS: clear to auscultation and  No wheeze or crackles HEART/CVS: regular rate & rhythm and no murmurs; No lower extremity edema ABDOMEN: abdomen soft, non-tender and normal bowel sounds; colostomy in place. Bilateral nephrostomy tubes in place. Musculoskeletal:no cyanosis of digits and no clubbing  PSYCH: alert & oriented x  3 with fluent speech NEURO: no focal motor/sensory deficits SKIN:  no rashes or significant lesions  LABORATORY DATA:  I have reviewed the data as listed Lab Results  Component Value Date   WBC 7.1 07/17/2017   HGB 12.5 (L) 07/17/2017   HCT 35.8 (L) 07/17/2017   MCV 86.3 07/17/2017   PLT 203 07/17/2017   Recent Labs    06/18/17 0909 07/03/17 0957 07/17/17 0935  NA 135 136 135  K 4.0 3.5 3.5  CL 102 104 104  CO2 '25 23 26  ' GLUCOSE 234* 204* 187*  BUN 28* 24* 25*  CREATININE 1.45* 1.31* 1.48*  CALCIUM 8.7* 8.9 8.8*  GFRNONAA 49* 55* 48*  GFRAA 56* >60 55*  PROT 7.7 6.8 7.2  ALBUMIN 3.5 3.4* 3.5  AST '22 17 16  ' ALT 16* 15* 14*  ALKPHOS 70 65 74  BILITOT 0.7 0.5 0.7    RADIOGRAPHIC STUDIES: I have personally reviewed the radiological images as listed and agreed with the findings in the report. No results found.  Results for ZADE, FALKNER (MRN [id number]) as of 07/17/2017 10:22  Ref. Range 04/10/2017 08:55 04/24/2017 08:27 05/22/2017 08:46 06/18/2017 09:09 07/03/2017 09:57  CEA Latest Ref Range: 0.0 - 4.7 ng/mL 93.2 (H) 53.4 (H) 10.0 (H) 5.9 (H) 4.7      ASSESSMENT & PLAN:   Rectal cancer ([place]) # RECURRENT RECTAL CANCER STAGE IV- [s/p LN Bx];  currently on 5FU infusion-IRI;  CT Jan 2019- partial response-improvement of the supraclavicular adenopathy mediastinal adenopathy retroperitoneal adenopathy.  Stable to slightly smaller pelvic necrotic mass. CEA improving ~4.7.  # Proceed with FOLFIRI # 10 today  [irinotecan-150 mg/m square; 5-FU 2200 mg/m square-]. Labs today reviewed;  acceptable for treatment today. Will order CTt scan at next visit.   #  CKD- secondary to bilateral hydronephrosis from the underlying pelvic mass. S/p PNC-recent change of tubes.  Bil.creatinine at 1.4-STABLE.   # Back pain- secondary to pelvic mass/PCN; continue hydrocodone as needed. STABLE.   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   # DM-2 ; on insulin/  amaryl; On Lantus. Better controlled.   # follow up in 2 weeks/labs;FOLFIRI/labs/CEA;CT to be ordered at next visit.  Cammie Sickle, MD 07/17/2017 10:30 AM

## 2017-07-17 NOTE — Assessment & Plan Note (Addendum)
#   RECURRENT RECTAL CANCER STAGE IV- [s/p LN Bx];  currently on 5FU infusion-IRI;  CT Jan 2019- partial response-improvement of the supraclavicular adenopathy mediastinal adenopathy retroperitoneal adenopathy.  Stable to slightly smaller pelvic necrotic mass. CEA improving ~4.7.  # Proceed with FOLFIRI # 10 today  [irinotecan-150 mg/m square; 5-FU 2200 mg/m square-]. Labs today reviewed;  acceptable for treatment today. Will order CTt scan at next visit.   #  CKD- secondary to bilateral hydronephrosis from the underlying pelvic mass. S/p PNC-recent change of tubes.  Bil.creatinine at 1.4-STABLE.   # Back pain- secondary to pelvic mass/PCN; continue hydrocodone as needed. STABLE.   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   # DM-2 ; on insulin/ amaryl; On Lantus. Better controlled.   # follow up in 2 weeks/labs;FOLFIRI/labs/CEA;CT to be ordered at next visit.

## 2017-07-19 ENCOUNTER — Inpatient Hospital Stay: Payer: Medicare Other

## 2017-07-19 VITALS — BP 123/67 | HR 71 | Temp 97.4°F | Resp 17

## 2017-07-19 DIAGNOSIS — N1330 Unspecified hydronephrosis: Secondary | ICD-10-CM | POA: Diagnosis not present

## 2017-07-19 DIAGNOSIS — C778 Secondary and unspecified malignant neoplasm of lymph nodes of multiple regions: Secondary | ICD-10-CM | POA: Diagnosis not present

## 2017-07-19 DIAGNOSIS — C20 Malignant neoplasm of rectum: Secondary | ICD-10-CM | POA: Diagnosis not present

## 2017-07-19 DIAGNOSIS — R1900 Intra-abdominal and pelvic swelling, mass and lump, unspecified site: Secondary | ICD-10-CM | POA: Diagnosis not present

## 2017-07-19 DIAGNOSIS — Z5111 Encounter for antineoplastic chemotherapy: Secondary | ICD-10-CM | POA: Diagnosis not present

## 2017-07-19 DIAGNOSIS — N189 Chronic kidney disease, unspecified: Secondary | ICD-10-CM | POA: Diagnosis not present

## 2017-07-19 MED ORDER — HEPARIN SOD (PORK) LOCK FLUSH 100 UNIT/ML IV SOLN
500.0000 [IU] | Freq: Once | INTRAVENOUS | Status: AC | PRN
  Administered 2017-07-19: 500 [IU]
  Filled 2017-07-19: qty 5

## 2017-07-25 ENCOUNTER — Other Ambulatory Visit: Payer: Self-pay | Admitting: Radiology

## 2017-07-26 ENCOUNTER — Encounter: Payer: Self-pay | Admitting: Diagnostic Radiology

## 2017-07-26 ENCOUNTER — Ambulatory Visit
Admission: RE | Admit: 2017-07-26 | Discharge: 2017-07-26 | Disposition: A | Discharge status: Home / Self Care | Payer: Medicare Other | Source: Ambulatory Visit | Attending: Urology | Admitting: Urology

## 2017-07-26 DIAGNOSIS — N135 Crossing vessel and stricture of ureter without hydronephrosis: Secondary | ICD-10-CM | POA: Insufficient documentation

## 2017-07-26 DIAGNOSIS — N1330 Unspecified hydronephrosis: Secondary | ICD-10-CM | POA: Insufficient documentation

## 2017-07-26 DIAGNOSIS — Z436 Encounter for attention to other artificial openings of urinary tract: Secondary | ICD-10-CM | POA: Insufficient documentation

## 2017-07-26 HISTORY — PX: IR NEPHROSTOMY EXCHANGE LEFT: IMG6069

## 2017-07-26 HISTORY — PX: IR NEPHROSTOMY EXCHANGE RIGHT: IMG6070

## 2017-07-26 MED ORDER — LIDOCAINE HCL (PF) 1 % IJ SOLN
INTRAMUSCULAR | Status: AC
  Filled 2017-07-26: qty 30

## 2017-07-26 MED ORDER — LIDOCAINE HCL (PF) 1 % IJ SOLN
10.0000 mL | Freq: Once | INTRAMUSCULAR | Status: DC
  Filled 2017-07-26: qty 10

## 2017-07-26 MED ORDER — IOPAMIDOL (ISOVUE-300) INJECTION 61%
30.0000 mL | Freq: Once | INTRAVENOUS | Status: AC | PRN
  Administered 2017-07-26: 09:00:00 10 mL via INTRAVENOUS

## 2017-07-26 NOTE — Procedures (Signed)
Bilateral neph tube exchange without difficulty  Complications:  None  Blood Loss: none  See dictation in canopy pacs

## 2017-07-29 ENCOUNTER — Other Ambulatory Visit: Payer: Self-pay | Admitting: Radiology

## 2017-07-29 DIAGNOSIS — N1330 Unspecified hydronephrosis: Secondary | ICD-10-CM

## 2017-07-29 DIAGNOSIS — N135 Crossing vessel and stricture of ureter without hydronephrosis: Secondary | ICD-10-CM

## 2017-07-31 ENCOUNTER — Encounter: Payer: Self-pay | Admitting: Internal Medicine

## 2017-07-31 ENCOUNTER — Inpatient Hospital Stay: Payer: Medicare Other | Attending: Internal Medicine

## 2017-07-31 ENCOUNTER — Inpatient Hospital Stay: Payer: Medicare Other

## 2017-07-31 ENCOUNTER — Inpatient Hospital Stay (HOSPITAL_BASED_OUTPATIENT_CLINIC_OR_DEPARTMENT_OTHER): Payer: Medicare Other | Admitting: Internal Medicine

## 2017-07-31 VITALS — BP 129/72 | HR 84 | Temp 97.9°F | Resp 16 | Wt 252.4 lb

## 2017-07-31 DIAGNOSIS — Z86718 Personal history of other venous thrombosis and embolism: Secondary | ICD-10-CM | POA: Diagnosis not present

## 2017-07-31 DIAGNOSIS — Z803 Family history of malignant neoplasm of breast: Secondary | ICD-10-CM

## 2017-07-31 DIAGNOSIS — Z5111 Encounter for antineoplastic chemotherapy: Secondary | ICD-10-CM | POA: Diagnosis not present

## 2017-07-31 DIAGNOSIS — E119 Type 2 diabetes mellitus without complications: Secondary | ICD-10-CM | POA: Diagnosis not present

## 2017-07-31 DIAGNOSIS — Z794 Long term (current) use of insulin: Secondary | ICD-10-CM | POA: Diagnosis not present

## 2017-07-31 DIAGNOSIS — G893 Neoplasm related pain (acute) (chronic): Secondary | ICD-10-CM | POA: Insufficient documentation

## 2017-07-31 DIAGNOSIS — M7989 Other specified soft tissue disorders: Secondary | ICD-10-CM

## 2017-07-31 DIAGNOSIS — Z79899 Other long term (current) drug therapy: Secondary | ICD-10-CM | POA: Insufficient documentation

## 2017-07-31 DIAGNOSIS — C20 Malignant neoplasm of rectum: Secondary | ICD-10-CM | POA: Diagnosis not present

## 2017-07-31 DIAGNOSIS — Z7901 Long term (current) use of anticoagulants: Secondary | ICD-10-CM

## 2017-07-31 DIAGNOSIS — N189 Chronic kidney disease, unspecified: Secondary | ICD-10-CM

## 2017-07-31 DIAGNOSIS — Z8042 Family history of malignant neoplasm of prostate: Secondary | ICD-10-CM

## 2017-07-31 DIAGNOSIS — Z452 Encounter for adjustment and management of vascular access device: Secondary | ICD-10-CM | POA: Diagnosis not present

## 2017-07-31 LAB — COMPREHENSIVE METABOLIC PANEL
ALT: 13 U/L — ABNORMAL LOW (ref 17–63)
AST: 18 U/L (ref 15–41)
Albumin: 3.6 g/dL (ref 3.5–5.0)
Alkaline Phosphatase: 63 U/L (ref 38–126)
Anion gap: 8 (ref 5–15)
BUN: 20 mg/dL (ref 6–20)
CHLORIDE: 104 mmol/L (ref 101–111)
CO2: 24 mmol/L (ref 22–32)
Calcium: 8.7 mg/dL — ABNORMAL LOW (ref 8.9–10.3)
Creatinine, Ser: 1.44 mg/dL — ABNORMAL HIGH (ref 0.61–1.24)
GFR, EST AFRICAN AMERICAN: 57 mL/min — AB (ref >60)
GFR, EST NON AFRICAN AMERICAN: 49 mL/min — AB (ref >60)
Glucose, Bld: 196 mg/dL — ABNORMAL HIGH (ref 65–99)
POTASSIUM: 3.9 mmol/L (ref 3.5–5.1)
Sodium: 136 mmol/L (ref 135–145)
Total Bilirubin: 0.6 mg/dL (ref 0.3–1.2)
Total Protein: 6.9 g/dL (ref 6.5–8.1)

## 2017-07-31 LAB — CBC WITH DIFFERENTIAL/PLATELET
Basophils Absolute: 0 10*3/uL (ref 0–0.1)
Basophils Relative: 1 %
Eosinophils Absolute: 0.3 10*3/uL (ref 0–0.7)
Eosinophils Relative: 4 %
HCT: 35.3 % — ABNORMAL LOW (ref 40.0–52.0)
HEMOGLOBIN: 12.3 g/dL — AB (ref 13.0–18.0)
LYMPHS ABS: 0.8 10*3/uL — AB (ref 1.0–3.6)
LYMPHS PCT: 11 %
MCH: 30.2 pg (ref 26.0–34.0)
MCHC: 34.9 g/dL (ref 32.0–36.0)
MCV: 86.7 fL (ref 80.0–100.0)
Monocytes Absolute: 0.6 10*3/uL (ref 0.2–1.0)
Monocytes Relative: 10 %
NEUTROS PCT: 74 %
Neutro Abs: 5.1 10*3/uL (ref 1.4–6.5)
Platelets: 213 10*3/uL (ref 150–440)
RBC: 4.07 MIL/uL — AB (ref 4.40–5.90)
RDW: 15 % — ABNORMAL HIGH (ref 11.5–14.5)
WBC: 6.8 10*3/uL (ref 3.8–10.6)

## 2017-07-31 MED ORDER — LEUCOVORIN CALCIUM INJECTION 350 MG
950.0000 mg | Freq: Once | INTRAVENOUS | Status: AC
  Administered 2017-07-31: 950 mg via INTRAVENOUS
  Filled 2017-07-31: qty 47.5

## 2017-07-31 MED ORDER — SODIUM CHLORIDE 0.9 % IV SOLN
Freq: Once | INTRAVENOUS | Status: AC
  Administered 2017-07-31: 10:00:00 via INTRAVENOUS
  Filled 2017-07-31: qty 1000

## 2017-07-31 MED ORDER — DEXTROSE 5 % IV SOLN
150.0000 mg/m2 | Freq: Once | INTRAVENOUS | Status: AC
  Administered 2017-07-31: 360 mg via INTRAVENOUS
  Filled 2017-07-31: qty 15

## 2017-07-31 MED ORDER — ATROPINE SULFATE 1 MG/ML IJ SOLN
0.5000 mg | Freq: Once | INTRAMUSCULAR | Status: AC | PRN
  Administered 2017-07-31: 0.5 mg via INTRAVENOUS
  Filled 2017-07-31: qty 1

## 2017-07-31 MED ORDER — HEPARIN SOD (PORK) LOCK FLUSH 100 UNIT/ML IV SOLN: 500.0000 [IU] | Freq: Once | INTRAVENOUS | Status: DC

## 2017-07-31 MED ORDER — SODIUM CHLORIDE 0.9 % IV SOLN
2200.0000 mg/m2 | INTRAVENOUS | Status: DC
  Administered 2017-07-31: 5250 mg via INTRAVENOUS
  Filled 2017-07-31: qty 100

## 2017-07-31 MED ORDER — PALONOSETRON HCL INJECTION 0.25 MG/5ML
0.2500 mg | Freq: Once | INTRAVENOUS | Status: AC
  Administered 2017-07-31: 0.25 mg via INTRAVENOUS
  Filled 2017-07-31: qty 5

## 2017-07-31 MED ORDER — SODIUM CHLORIDE 0.9% FLUSH
10.0000 mL | INTRAVENOUS | Status: DC | PRN
  Administered 2017-07-31: 10 mL via INTRAVENOUS
  Filled 2017-07-31: qty 10

## 2017-07-31 NOTE — Progress Notes (Signed)
[place] NOTE  Patient Care Team: Dion Body, MD as PCP - General (Family Medicine) Clent Jacks, RN as Registered Nurse  CHIEF COMPLAINTS/PURPOSE OF CONSULTATION: RECTAL CANCER  #  Oncology History   # 2012- RECTO-SIGMOID-poorly diff STAGE III CA [s/p neo-adj chemo-RT- poor response]; APR [residual ypT3; 5/8 LN positiveDr.Smith/Dr.Pandit]  S/p FOLFOX   # SEP 2019- Pre-sacral fluid/mass- increasing ~ 5-6 cm [increased from dec 2017; also NEW retroperitoneal lymph nodes; inguinal adenopathy]; SEP 2018- left supraclavicular; mediastinal; right hilar-retroperitoneal; pelvic mass/node; bilateral inguinal adenopathy; s/p right Ingiunal LNBx - RECURRENT CA [necrotic;insuff tissue for F-One]  # OCT 8th 2018-FOLFIRI [first cycle 5FU alone]; OCT 22nd FOLFIRI  #   ------------------------------  ---  OCT-NOV 2018-Sepsis x2 sec to UTI [s/p ABx;]  # s/p RT rectal [11/28-last treatment]  # Acute renal insuff [s/p bil PCN]; hematuria- sec to bladder invol [? Contra-indication to avastin]  # LEFT LE DVT [? [facility]- Dr.Schneir]; Eliquis.   # CHF/COPD/CKD/ PN sec to [place]  # Foundation One-PDL-1/ TPS- 0% [2012-rectal specimen];12/26-Omniseq- K-RAS MUTATED;MSS; no targets**     Rectal cancer (HCC)    HISTORY OF PRESENTING ILLNESS:  Donald Townsend 67 y.o.  male  above History of recurrent metastatic rectal cancer currently on FOLFIRI status post cycle # 10 is here for follow-up.  Patient denies any worsening shortness of breath or cough.  Denies any diarrhea.  No nausea vomiting.  No tingling or numbness.  His appetite is good.  No weight loss.  Denies any blood in urine.  ROS: A complete 10 point review of system is done which is negative except mentioned above in history of present illness.   MEDICAL HISTORY:  Past Medical History:  Diagnosis Date  . A-fib ([place])   . Cancer [facility])    Colon  . CHF (congestive heart failure) ([place])   .  Diabetes mellitus without complication ([place])   . Dyspnea   . Elevated lipids   . Hypertension   . PVD (peripheral vascular disease) ([place])     SURGICAL HISTORY: Past Surgical History:  Procedure Laterality Date  . COLON RESECTION     with colostomy  . COLONOSCOPY    . CYSTOSCOPY WITH STENT PLACEMENT Bilateral 01/18/2017   Procedure: CYSTOSCOPY WITH STENT PLACEMENT;  Surgeon: [name], MD;  Location: [facility] ORS;  Service: Urology;  Laterality: Bilateral;  . IR FLUORO GUIDE PORT INSERTION RIGHT  01/24/2017  . IR NEPHROSTOMY EXCHANGE LEFT  03/14/2017  . IR NEPHROSTOMY EXCHANGE LEFT  05/03/2017  . IR NEPHROSTOMY EXCHANGE LEFT  06/14/2017  . IR NEPHROSTOMY EXCHANGE LEFT  07/26/2017  . IR NEPHROSTOMY EXCHANGE RIGHT  03/14/2017  . IR NEPHROSTOMY EXCHANGE RIGHT  05/03/2017  . IR NEPHROSTOMY EXCHANGE RIGHT  06/14/2017  . IR NEPHROSTOMY EXCHANGE RIGHT  07/26/2017  . IR NEPHROSTOMY PLACEMENT LEFT  01/19/2017  . IR NEPHROSTOMY PLACEMENT RIGHT  01/19/2017  . IVC FILTER REMOVAL N/A 08/28/2016   Procedure: IVC Filter Removal;  Surgeon: Katha Cabal, MD;  Location: [place] CV LAB;  Service: Cardiovascular;  Laterality: N/A;  . LOWER EXTREMITY VENOGRAPHY Left 12/18/2016   Procedure: Lower Extremity Venography;  Surgeon: Katha Cabal, MD;  Location: [place] CV LAB;  Service: Cardiovascular;  Laterality: Left;  . PERIPHERAL VASCULAR CATHETERIZATION Left 05/08/2016   Procedure: Lower Extremity Venography;  Surgeon: Katha Cabal, MD;  Location: [place] CV LAB;  Service: Cardiovascular;  Laterality: Left;    SOCIAL HISTORY: [place]; retd-  Supervisor; currently works part time. He lives alone. No smoking occasional alcohol. Social History   Socioeconomic History  . Marital status: Divorced    Spouse name: Not on file  . Number of children: Not on file  . Years of education: Not on file  . Highest education level: Not on file  Occupational History  . Not on file   Social Needs  . Financial resource strain: Not on file  . Food insecurity:    Worry: Not on file    Inability: Not on file  . Transportation needs:    Medical: Not on file    Non-medical: Not on file  Tobacco Use  . Smoking status: Never Smoker  . Smokeless tobacco: Never Used  Substance and Sexual Activity  . Alcohol use: No  . Drug use: No  . Sexual activity: Not on file  Lifestyle  . Physical activity:    Days per week: Not on file    Minutes per session: Not on file  . Stress: Not on file  Relationships  . Social connections:    Talks on phone: Not on file    Gets together: Not on file    Attends religious service: Not on file    Active member of club or organization: Not on file    Attends meetings of clubs or organizations: Not on file    Relationship status: Not on file  . Intimate partner violence:    Fear of current or ex partner: Not on file    Emotionally abused: Not on file    Physically abused: Not on file    Forced sexual activity: Not on file  Other Topics Concern  . Not on file  Social History Narrative  . Not on file    FAMILY HISTORY: Family History  Problem Relation Age of Onset  . Diabetes Mother   . Stroke Mother   . Prostate cancer Brother   . Cancer Sister        Breast cancer  . Kidney cancer Neg Hx   . Bladder Cancer Neg Hx     ALLERGIES:  has No Known Allergies.  MEDICATIONS:  Current Outpatient Medications  Medication Sig Dispense Refill  . B-D ULTRAFINE III SHORT PEN 31G X 8 MM MISC     . ciprofloxacin (CIPRO) 250 MG tablet Take 1 tablet (250 mg total) by mouth 2 (two) times daily. Take starting the day before nephrostomy tube exchange 6 tablet 0  . Continuous Blood Gluc Receiver (FREESTYLE LIBRE READER) DEVI Use 1 Units as directed. Check CBG's four times daily. Dx: E11.9    . Continuous Blood Gluc Sensor (FREESTYLE LIBRE SENSOR SYSTEM) MISC Use 1 Units as directed. Check CBG's four times daily. Dx: E11.9    . digoxin  (LANOXIN) 0.25 MG tablet Take 0.25 mg by mouth daily.     [name] ELIQUIS 5 MG TABS tablet Take 5 mg by mouth every 12 (twelve) hours.  0  . feeding supplement, GLUCERNA SHAKE, ([place]) LIQD Follow manufacturer instructions    . furosemide (LASIX) 40 MG tablet Take 40 mg by mouth daily.   0  . glimepiride (AMARYL) 4 MG tablet Take 1 tablet by mouth daily.    [name] HYDROcodone-acetaminophen (NORCO/VICODIN) 5-325 MG tablet Take 1 tablet by mouth every 6 (six) hours as needed for moderate pain. 90 tablet 0  . insulin aspart (NOVOLOG) 100 UNIT/ML FlexPen Inject 7 Units into the skin 3 (three) times daily with meals.     [name]  Lancets 30G MISC Use 1 Units as directed. Check CBG's up to twice daily. Dx: E11.9    . LANTUS SOLOSTAR 100 UNIT/ML Solostar Pen Inject 20 Units into the skin at bedtime.   0  . losartan (COZAAR) 100 MG tablet Take 100 mg by mouth daily.    . metoprolol tartrate (LOPRESSOR) 25 MG tablet Take 25 mg by mouth 2 (two) times daily.     . ondansetron (ZOFRAN) 8 MG tablet 1 pill every 8 hours for nausea/vomitting as needed 40 tablet 2  . pravastatin (PRAVACHOL) 20 MG tablet take 1m by mouth daily at bedtime    . prochlorperazine (COMPAZINE) 10 MG tablet Take 1 tablet (10 mg total) by mouth every 6 (six) hours as needed for nausea or vomiting. 40 tablet 1  . acidophilus (RISAQUAD) CAPS capsule Take 1 capsule daily by mouth. (Patient not taking: Reported on 07/26/2017) 30 capsule 0  . cyclobenzaprine (FEXMID) 7.5 MG tablet Take 1 tablet (7.5 mg total) 3 (three) times daily as needed by mouth for muscle spasms. (Patient not taking: Reported on 07/08/2017) 30 tablet 0   No current facility-administered medications for this visit.       .[name] PHYSICAL EXAMINATION: ECOG PERFORMANCE STATUS: 0 - Asymptomatic  Vitals:   07/31/17 0912  BP: 129/72  Pulse: 84  Resp: 16  Temp: 97.9 F (36.6 C)   Filed Weights   07/31/17 0912  Weight: 252 lb 6.4 oz (114.5 kg)    GENERAL: Well-nourished  well-developed; Alert, no distress and comfortable.  He is alone. EYES: no pallor or icterus OROPHARYNX: no thrush or ulceration; good dentition  NECK: supple, no masses felt LYMPH:  no palpable lymphadenopathy in the cervical, axillary or inguinal regions LUNGS: clear to auscultation and  No wheeze or crackles HEART/CVS: regular rate & rhythm and no murmurs; No lower extremity edema ABDOMEN: abdomen soft, non-tender and normal bowel sounds; colostomy in place. Bilateral nephrostomy tubes in place. Musculoskeletal:no cyanosis of digits and no clubbing  PSYCH: alert & oriented x 3 with fluent speech NEURO: no focal motor/sensory deficits SKIN:  no rashes or significant lesions  LABORATORY DATA:  I have reviewed the data as listed Lab Results  Component Value Date   WBC 6.8 07/31/2017   HGB 12.3 (L) 07/31/2017   HCT 35.3 (L) 07/31/2017   MCV 86.7 07/31/2017   PLT 213 07/31/2017   Recent Labs    07/03/17 0957 07/17/17 0935 07/31/17 0812  NA 136 135 136  K 3.5 3.5 3.9  CL 104 104 104  CO2 '23 26 24  ' GLUCOSE 204* 187* 196*  BUN 24* 25* 20  CREATININE 1.31* 1.48* 1.44*  CALCIUM 8.9 8.8* 8.7*  GFRNONAA 55* 48* 49*  GFRAA >60 55* 57*  PROT 6.8 7.2 6.9  ALBUMIN 3.4* 3.5 3.6  AST '17 16 18  ' ALT 15* 14* 13*  ALKPHOS 65 74 63  BILITOT 0.5 0.7 0.6    RADIOGRAPHIC STUDIES: I have personally reviewed the radiological images as listed and agreed with the findings in the report. Ir Nephrostomy Exchange Left  Result Date: 07/26/2017 INDICATION: History of rectal carcinoma with urinary diversion with bilateral nephrostomy catheters EXAM: IR EXCHANGE NEPHROSTOMY RIGHT; IR EXCHANGE NEPHROSTOMY LEFT COMPARISON:  None. MEDICATIONS: None ANESTHESIA/SEDATION: None CONTRAST:  149mISOVUE-300 FLUOROSCOPY TIME:  Fluoroscopy Time: 1 minutes COMPLICATIONS: None immediate. PROCEDURE: Informed written consent was obtained from the patient after a thorough discussion of the procedural risks, benefits  and alternatives. All questions were addressed. Maximal Sterile  Barrier Technique was utilized including caps, mask, sterile gowns, sterile gloves, sterile drape, hand hygiene and skin antiseptic. A timeout was performed prior to the initiation of the procedure. Contrast material was not initially injected into the left collecting system two documented nephrostomy placement. The nephrostomy catheter was then removed over a Bentson wire and exchanged for a new 10 French nephrostomy catheter. This was coiled in the left renal pelvis. Contrast in check shin was then performed to confirm catheter position. The catheter was then affixed to the skin surface utilizing 0 Prolene and a Hopkins buttress. Subsequently utilizing a similar technique the right nephrostomy catheter was exchanged for a new 10 French nephrostomy catheter. The catheter was fixed in place utilizing 0 Prolene and a Hopkins buttress. Catheters were then dressed in the standard manner. The patient tolerated the procedure well and was returned his room in satisfactory condition. IMPRESSION: Successful exchange of bilateral nephrostomy catheters as described. Electronically Signed   By: [name] M.D.   On: 07/26/2017 09:37   Ir Nephrostomy Exchange Right  Result Date: 07/26/2017 INDICATION: History of rectal carcinoma with urinary diversion with bilateral nephrostomy catheters EXAM: IR EXCHANGE NEPHROSTOMY RIGHT; IR EXCHANGE NEPHROSTOMY LEFT COMPARISON:  None. MEDICATIONS: None ANESTHESIA/SEDATION: None CONTRAST:  62m ISOVUE-300 FLUOROSCOPY TIME:  Fluoroscopy Time: 1 minutes COMPLICATIONS: None immediate. PROCEDURE: Informed written consent was obtained from the patient after a thorough discussion of the procedural risks, benefits and alternatives. All questions were addressed. Maximal Sterile Barrier Technique was utilized including caps, mask, sterile gowns, sterile gloves, sterile drape, hand hygiene and skin antiseptic. A timeout was performed  prior to the initiation of the procedure. Contrast material was not initially injected into the left collecting system two documented nephrostomy placement. The nephrostomy catheter was then removed over a Bentson wire and exchanged for a new 10 French nephrostomy catheter. This was coiled in the left renal pelvis. Contrast in check shin was then performed to confirm catheter position. The catheter was then affixed to the skin surface utilizing 0 Prolene and a Hopkins buttress. Subsequently utilizing a similar technique the right nephrostomy catheter was exchanged for a new 10 French nephrostomy catheter. The catheter was fixed in place utilizing 0 Prolene and a Hopkins buttress. Catheters were then dressed in the standard manner. The patient tolerated the procedure well and was returned his room in satisfactory condition. IMPRESSION: Successful exchange of bilateral nephrostomy catheters as described. Electronically Signed   By: MInez CatalinaM.D.   On: 07/26/2017 09:37    Results for WRIDHAAN, DREIBELBIS(MRN [id number] as of 07/17/2017 10:22  Ref. Range 04/10/2017 08:55 04/24/2017 08:27 05/22/2017 08:46 06/18/2017 09:09 07/03/2017 09:57  CEA Latest Ref Range: 0.0 - 4.7 ng/mL 93.2 (H) 53.4 (H) 10.0 (H) 5.9 (H) 4.7      ASSESSMENT & PLAN:   [place] RECURRENT RECTAL CANCER STAGE IV- [s/p LN Bx];  currently on 5FU infusion-IRI;  CT Jan 2019- partial response-improvement of the supraclavicular adenopathy mediastinal adenopathy retroperitoneal adenopathy.  Stable to slightly smaller pelvic necrotic mass. CEA improving ~4.7.  # Proceed with FOLFIRI # 11 today  [irinotecan-150 mg/m square; 5-FU 2200 mg/m square-]. Labs today reviewed;  acceptable for treatment today.   #  CKD- secondary to bilateral hydronephrosis from the underlying pelvic mass. S/p PNC-recent change of tubes [April 2019]  Bil.creatinine at 1.4-STABLE.   # Back pain- secondary to pelvic mass/PCN; continue hydrocodone as needed.  STABLE.   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   # DM-2 ; on insulin/ amaryl; On Lantus. Better controlled.   # follow up in 2 weeks/labs;FOLFIRI/labs/CEA/ CT scan prior     Cammie Sickle, MD 08/06/2017 7:00 PM

## 2017-07-31 NOTE — Assessment & Plan Note (Addendum)
#   RECURRENT RECTAL CANCER STAGE IV- [s/p LN Bx];  currently on 5FU infusion-IRI;  CT Jan 2019- partial response-improvement of the supraclavicular adenopathy mediastinal adenopathy retroperitoneal adenopathy.  Stable to slightly smaller pelvic necrotic mass. CEA improving ~4.7.  # Proceed with FOLFIRI # 11 today  [irinotecan-150 mg/m square; 5-FU 2200 mg/m square-]. Labs today reviewed;  acceptable for treatment today.   #  CKD- secondary to bilateral hydronephrosis from the underlying pelvic mass. S/p PNC-recent change of tubes [April 2019]  Bil.creatinine at 1.4-STABLE.   # Back pain- secondary to pelvic mass/PCN; continue hydrocodone as needed. STABLE.   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   # DM-2 ; on insulin/ amaryl; On Lantus. Better controlled.   # follow up in 2 weeks/labs;FOLFIRI/labs/CEA/ CT scan prior

## 2017-07-31 NOTE — Progress Notes (Signed)
Patient is here today for a follow up. Patient states that his only concern today is that he is having trouble sleeping. Patient states he has noticed that it is hard for him to get comfortable, and fall asleep.  Patient would like to discuss over the counter options to help improve his sleep.

## 2017-08-01 LAB — CEA: CEA1: 4.2 ng/mL (ref 0.0–4.7)

## 2017-08-02 ENCOUNTER — Inpatient Hospital Stay: Payer: Medicare Other

## 2017-08-02 VITALS — BP 106/69 | HR 76 | Temp 97.0°F | Resp 20

## 2017-08-02 DIAGNOSIS — C20 Malignant neoplasm of rectum: Secondary | ICD-10-CM

## 2017-08-02 DIAGNOSIS — E119 Type 2 diabetes mellitus without complications: Secondary | ICD-10-CM | POA: Diagnosis not present

## 2017-08-02 DIAGNOSIS — Z5111 Encounter for antineoplastic chemotherapy: Secondary | ICD-10-CM | POA: Diagnosis not present

## 2017-08-02 DIAGNOSIS — N189 Chronic kidney disease, unspecified: Secondary | ICD-10-CM | POA: Diagnosis not present

## 2017-08-02 DIAGNOSIS — Z452 Encounter for adjustment and management of vascular access device: Secondary | ICD-10-CM | POA: Diagnosis not present

## 2017-08-02 DIAGNOSIS — G893 Neoplasm related pain (acute) (chronic): Secondary | ICD-10-CM | POA: Diagnosis not present

## 2017-08-02 MED ORDER — HEPARIN SOD (PORK) LOCK FLUSH 100 UNIT/ML IV SOLN
500.0000 [IU] | Freq: Once | INTRAVENOUS | Status: AC | PRN
  Administered 2017-08-02: 500 [IU]

## 2017-08-02 MED ORDER — HEPARIN SOD (PORK) LOCK FLUSH 100 UNIT/ML IV SOLN
INTRAVENOUS | Status: AC
Start: 2017-08-02 — End: 2017-08-02
  Filled 2017-08-02: qty 5

## 2017-08-02 MED ORDER — SODIUM CHLORIDE 0.9% FLUSH
10.0000 mL | INTRAVENOUS | Status: DC | PRN
  Administered 2017-08-02: 10 mL
  Filled 2017-08-02: qty 10

## 2017-08-12 ENCOUNTER — Ambulatory Visit
Admission: RE | Admit: 2017-08-12 | Discharge: 2017-08-12 | Disposition: A | Discharge status: Home / Self Care | Payer: Medicare Other | Source: Ambulatory Visit | Attending: Internal Medicine | Admitting: Internal Medicine

## 2017-08-12 DIAGNOSIS — Z85048 Personal history of other malignant neoplasm of rectum, rectosigmoid junction, and anus: Secondary | ICD-10-CM | POA: Diagnosis not present

## 2017-08-12 DIAGNOSIS — R599 Enlarged lymph nodes, unspecified: Secondary | ICD-10-CM | POA: Insufficient documentation

## 2017-08-12 DIAGNOSIS — Z9889 Other specified postprocedural states: Secondary | ICD-10-CM | POA: Insufficient documentation

## 2017-08-12 DIAGNOSIS — Z923 Personal history of irradiation: Secondary | ICD-10-CM | POA: Insufficient documentation

## 2017-08-12 DIAGNOSIS — I2510 Atherosclerotic heart disease of native coronary artery without angina pectoris: Secondary | ICD-10-CM | POA: Diagnosis not present

## 2017-08-12 DIAGNOSIS — I700 Atherosclerosis of aorta: Secondary | ICD-10-CM | POA: Insufficient documentation

## 2017-08-12 DIAGNOSIS — C20 Malignant neoplasm of rectum: Secondary | ICD-10-CM

## 2017-08-12 DIAGNOSIS — Z5111 Encounter for antineoplastic chemotherapy: Secondary | ICD-10-CM | POA: Diagnosis not present

## 2017-08-14 ENCOUNTER — Inpatient Hospital Stay (HOSPITAL_BASED_OUTPATIENT_CLINIC_OR_DEPARTMENT_OTHER): Payer: Medicare Other | Admitting: Internal Medicine

## 2017-08-14 ENCOUNTER — Inpatient Hospital Stay: Payer: Medicare Other

## 2017-08-14 ENCOUNTER — Telehealth: Payer: Self-pay

## 2017-08-14 ENCOUNTER — Other Ambulatory Visit: Payer: Self-pay

## 2017-08-14 VITALS — BP 135/80 | HR 74 | Temp 97.9°F | Resp 20 | Ht 70.5 in | Wt 250.0 lb

## 2017-08-14 DIAGNOSIS — E119 Type 2 diabetes mellitus without complications: Secondary | ICD-10-CM | POA: Diagnosis not present

## 2017-08-14 DIAGNOSIS — Z5111 Encounter for antineoplastic chemotherapy: Secondary | ICD-10-CM | POA: Diagnosis not present

## 2017-08-14 DIAGNOSIS — N189 Chronic kidney disease, unspecified: Secondary | ICD-10-CM

## 2017-08-14 DIAGNOSIS — Z7901 Long term (current) use of anticoagulants: Secondary | ICD-10-CM

## 2017-08-14 DIAGNOSIS — Z86718 Personal history of other venous thrombosis and embolism: Secondary | ICD-10-CM | POA: Diagnosis not present

## 2017-08-14 DIAGNOSIS — C20 Malignant neoplasm of rectum: Secondary | ICD-10-CM | POA: Diagnosis not present

## 2017-08-14 DIAGNOSIS — Z8042 Family history of malignant neoplasm of prostate: Secondary | ICD-10-CM

## 2017-08-14 DIAGNOSIS — G893 Neoplasm related pain (acute) (chronic): Secondary | ICD-10-CM

## 2017-08-14 DIAGNOSIS — Z794 Long term (current) use of insulin: Secondary | ICD-10-CM | POA: Diagnosis not present

## 2017-08-14 DIAGNOSIS — Z803 Family history of malignant neoplasm of breast: Secondary | ICD-10-CM

## 2017-08-14 DIAGNOSIS — M7989 Other specified soft tissue disorders: Secondary | ICD-10-CM

## 2017-08-14 DIAGNOSIS — Z452 Encounter for adjustment and management of vascular access device: Secondary | ICD-10-CM | POA: Diagnosis not present

## 2017-08-14 LAB — CBC WITH DIFFERENTIAL/PLATELET
BASOS PCT: 1 %
Basophils Absolute: 0.1 10*3/uL (ref 0–0.1)
EOS ABS: 0.2 10*3/uL (ref 0–0.7)
EOS PCT: 3 %
HCT: 35.5 % — ABNORMAL LOW (ref 40.0–52.0)
HEMOGLOBIN: 12.5 g/dL — AB (ref 13.0–18.0)
Lymphocytes Relative: 12 %
Lymphs Abs: 1 10*3/uL (ref 1.0–3.6)
MCH: 30.2 pg (ref 26.0–34.0)
MCHC: 35.3 g/dL (ref 32.0–36.0)
MCV: 85.6 fL (ref 80.0–100.0)
MONOS PCT: 9 %
Monocytes Absolute: 0.8 10*3/uL (ref 0.2–1.0)
NEUTROS PCT: 75 %
Neutro Abs: 6.2 10*3/uL (ref 1.4–6.5)
PLATELETS: 195 10*3/uL (ref 150–440)
RBC: 4.15 MIL/uL — ABNORMAL LOW (ref 4.40–5.90)
RDW: 15.4 % — ABNORMAL HIGH (ref 11.5–14.5)
WBC: 8.3 10*3/uL (ref 3.8–10.6)

## 2017-08-14 LAB — COMPREHENSIVE METABOLIC PANEL
ALK PHOS: 70 U/L (ref 38–126)
ALT: 17 U/L (ref 17–63)
AST: 19 U/L (ref 15–41)
Albumin: 3.5 g/dL (ref 3.5–5.0)
Anion gap: 8 (ref 5–15)
BUN: 24 mg/dL — AB (ref 6–20)
CALCIUM: 9.1 mg/dL (ref 8.9–10.3)
CO2: 23 mmol/L (ref 22–32)
CREATININE: 1.48 mg/dL — AB (ref 0.61–1.24)
Chloride: 103 mmol/L (ref 101–111)
GFR calc Af Amer: 55 mL/min — ABNORMAL LOW (ref >60)
GFR, EST NON AFRICAN AMERICAN: 48 mL/min — AB (ref >60)
Glucose, Bld: 246 mg/dL — ABNORMAL HIGH (ref 65–99)
Potassium: 3.9 mmol/L (ref 3.5–5.1)
Sodium: 134 mmol/L — ABNORMAL LOW (ref 135–145)
Total Bilirubin: 0.7 mg/dL (ref 0.3–1.2)
Total Protein: 6.9 g/dL (ref 6.5–8.1)

## 2017-08-14 MED ORDER — SODIUM CHLORIDE 0.9% FLUSH
10.0000 mL | Freq: Once | INTRAVENOUS | Status: AC
  Administered 2017-08-14: 10 mL via INTRAVENOUS
  Filled 2017-08-14: qty 10

## 2017-08-14 MED ORDER — LEUCOVORIN CALCIUM INJECTION 350 MG
1000.0000 mg | Freq: Once | INTRAMUSCULAR | Status: AC
  Administered 2017-08-14: 1000 mg via INTRAVENOUS
  Filled 2017-08-14: qty 50

## 2017-08-14 MED ORDER — HEPARIN SOD (PORK) LOCK FLUSH 100 UNIT/ML IV SOLN: 500.0000 [IU] | Freq: Once | INTRAVENOUS | Status: DC

## 2017-08-14 MED ORDER — ATROPINE SULFATE 1 MG/ML IJ SOLN
0.5000 mg | Freq: Once | INTRAMUSCULAR | Status: AC | PRN
  Administered 2017-08-14: 0.5 mg via INTRAVENOUS
  Filled 2017-08-14: qty 1

## 2017-08-14 MED ORDER — HYDROCODONE-ACETAMINOPHEN 5-325 MG PO TABS: 1.0000 | ORAL_TABLET | Freq: Two times a day (BID) | ORAL | 0 refills | Status: DC | PRN

## 2017-08-14 MED ORDER — PALONOSETRON HCL INJECTION 0.25 MG/5ML
0.2500 mg | Freq: Once | INTRAVENOUS | Status: AC
  Administered 2017-08-14: 0.25 mg via INTRAVENOUS
  Filled 2017-08-14: qty 5

## 2017-08-14 MED ORDER — DEXTROSE 5 % IV SOLN
150.0000 mg/m2 | Freq: Once | INTRAVENOUS | Status: AC
  Administered 2017-08-14: 360 mg via INTRAVENOUS
  Filled 2017-08-14: qty 15

## 2017-08-14 MED ORDER — SODIUM CHLORIDE 0.9 % IV SOLN
Freq: Once | INTRAVENOUS | Status: AC
  Administered 2017-08-14: 11:00:00 via INTRAVENOUS
  Filled 2017-08-14: qty 1000

## 2017-08-14 MED ORDER — FLUOROURACIL CHEMO INJECTION 5 GM/100ML
5000.0000 mg | INTRAVENOUS | Status: DC
  Administered 2017-08-14: 5000 mg via INTRAVENOUS
  Filled 2017-08-14: qty 100

## 2017-08-14 NOTE — Telephone Encounter (Signed)
PA submitted for Hydrocodone - Acetaminophen 5-325mg    Key: [id number] - Rx #: [id number]

## 2017-08-14 NOTE — Assessment & Plan Note (Addendum)
#   RECURRENT RECTAL CANCER STAGE IV- [s/p LN Bx]; FOLFIRI ; CT April 2019-significant partial response; stable pre-rectal fluid collection.  # Proceed with FOLFIRI # 12 today  [irinotecan-150 mg/m square; 5-FU 2200 mg/m square-]. Labs today reviewed;  acceptable for treatment today.   #Patient requesting spacing out the treatments; I think it is reasonable-plan FOLFIRI every 3 weeks  #  CKD- secondary to bilateral hydronephrosis from the underlying pelvic mass. S/p PNC-recent change of tubes [April 2019]  Bil.creatinine at 1.4-STABLE.   # Back pain- secondary to pelvic mass/PCN; continue hydrocodone as needed. STABLE.   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   # DM-2 ; on insulin/ amaryl; On Lantus. Better controlled.   # follow up in 3 weeks/labs;FOLFIRI/labs/CEA

## 2017-08-14 NOTE — Progress Notes (Signed)
[facility] NOTE  Patient Care Team: Dion Body, MD as PCP - General (Family Medicine) Clent Jacks, RN as Registered Nurse  CHIEF COMPLAINTS/PURPOSE OF CONSULTATION: RECTAL CANCER  #  Oncology History   # 2012- RECTO-SIGMOID-poorly diff STAGE III CA [s/p neo-adj chemo-RT- poor response]; APR [residual ypT3; 5/8 LN positiveDr.Smith/Dr.Pandit]  S/p FOLFOX   # SEP 2019- Pre-sacral fluid/mass- increasing ~ 5-6 cm [increased from dec 2017; also NEW retroperitoneal lymph nodes; inguinal adenopathy]; SEP 2018- left supraclavicular; mediastinal; right hilar-retroperitoneal; pelvic mass/node; bilateral inguinal adenopathy; s/p right Ingiunal LNBx - RECURRENT CA [necrotic;insuff tissue for F-One]  # OCT 8th 2018-FOLFIRI [first cycle 5FU alone]; OCT 22nd FOLFIRI; April 2019- significant PR  # MAY 2019- FOLFIRI q 3W [pt pref]  ------------------------------  ---  OCT-NOV 2018-Sepsis x2 sec to UTI [s/p ABx;]  # s/p RT rectal [11/28-last treatment]  # Acute renal insuff [s/p bil PCN]; hematuria- sec to bladder invol [? Contra-indication to avastin]  # LEFT LE DVT [? [facility]- Dr.Schneir]; Eliquis.   # CHF/COPD/CKD/ PN sec to [place]  # [employer] One-PDL-1/ TPS- 0% [2012-rectal specimen];12/26-Omniseq- K-RAS MUTATED;MSS; no targets**     Rectal cancer (HCC)    HISTORY OF PRESENTING ILLNESS:  Donald Townsend 67 y.o.  male  above History of recurrent metastatic rectal cancer currently on FOLFIRI status post cycle # 11 is here for follow-up.  Patient [place].  Denies any diarrhea.  No chest pain or shortness of breath or cough.  No weight loss.  No skin rash or ulcers.  Denies any blood in urine.  ROS: A complete 10 point review of system is done which is negative except mentioned above in history of present illness.   MEDICAL HISTORY:  Past Medical History:  Diagnosis Date  . A-fib ([place])   . Cancer [facility])     Colon  . CHF (congestive heart failure) ([place])   . Diabetes mellitus without complication ([place])   . Dyspnea   . Elevated lipids   . Hypertension   . PVD (peripheral vascular disease) ([place])     SURGICAL HISTORY: Past Surgical History:  Procedure Laterality Date  . COLON RESECTION     with colostomy  . COLONOSCOPY    . CYSTOSCOPY WITH STENT PLACEMENT Bilateral 01/18/2017   Procedure: CYSTOSCOPY WITH STENT PLACEMENT;  Surgeon: [name], MD;  Location: [facility] ORS;  Service: Urology;  Laterality: Bilateral;  . IR FLUORO GUIDE PORT INSERTION RIGHT  01/24/2017  . IR NEPHROSTOMY EXCHANGE LEFT  03/14/2017  . IR NEPHROSTOMY EXCHANGE LEFT  05/03/2017  . IR NEPHROSTOMY EXCHANGE LEFT  06/14/2017  . IR NEPHROSTOMY EXCHANGE LEFT  07/26/2017  . IR NEPHROSTOMY EXCHANGE RIGHT  03/14/2017  . IR NEPHROSTOMY EXCHANGE RIGHT  05/03/2017  . IR NEPHROSTOMY EXCHANGE RIGHT  06/14/2017  . IR NEPHROSTOMY EXCHANGE RIGHT  07/26/2017  . IR NEPHROSTOMY PLACEMENT LEFT  01/19/2017  . IR NEPHROSTOMY PLACEMENT RIGHT  01/19/2017  . IVC FILTER REMOVAL N/A 08/28/2016   Procedure: IVC Filter Removal;  Surgeon: Katha Cabal, MD;  Location: [place] [facility];  Service: Cardiovascular;  Laterality: N/A;  . LOWER EXTREMITY VENOGRAPHY Left 12/18/2016   Procedure: Lower Extremity Venography;  Surgeon: Katha Cabal, MD;  Location: [place] [facility];  Service: Cardiovascular;  Laterality: Left;  . PERIPHERAL VASCULAR CATHETERIZATION Left 05/08/2016   Procedure: Lower Extremity Venography;  Surgeon: Katha Cabal, MD;  Location: [place] [facility];  Service: Cardiovascular;  Laterality: Left;    SOCIAL HISTORY: [place]; retdOccupational psychologist; currently works part time. He lives alone. No smoking occasional alcohol. Social History   Socioeconomic History  . Marital status: Divorced    Spouse name: Not on file  . Number of children: Not on file  . Years of education: Not on file  . Highest education level: Not  on file  Occupational History  . Not on file  Social Needs  . Financial resource strain: Not on file  . Food insecurity:    Worry: Not on file    Inability: Not on file  . Transportation needs:    Medical: Not on file    Non-medical: Not on file  Tobacco Use  . Smoking status: Never Smoker  . Smokeless tobacco: Never Used  Substance and Sexual Activity  . Alcohol use: No  . Drug use: No  . Sexual activity: Not on file  Lifestyle  . Physical activity:    Days per week: Not on file    Minutes per session: Not on file  . Stress: Not on file  Relationships  . Social connections:    Talks on phone: Not on file    Gets together: Not on file    Attends religious service: Not on file    Active member of club or organization: Not on file    Attends meetings of clubs or organizations: Not on file    Relationship status: Not on file  . Intimate partner violence:    Fear of current or ex partner: Not on file    Emotionally abused: Not on file    Physically abused: Not on file    Forced sexual activity: Not on file  Other Topics Concern  . Not on file  Social History Narrative  . Not on file    FAMILY HISTORY: Family History  Problem Relation Age of Onset  . Diabetes Mother   . Stroke Mother   . Prostate cancer Brother   . Cancer Sister        Breast cancer  . Kidney cancer Neg Hx   . Bladder Cancer Neg Hx     ALLERGIES:  has No Known Allergies.  MEDICATIONS:  Current Outpatient Medications  Medication Sig Dispense Refill  . acidophilus (RISAQUAD) CAPS capsule Take 1 capsule daily by mouth. 30 capsule 0  . B-D ULTRAFINE III SHORT PEN 31G X 8 MM MISC     . ciprofloxacin (CIPRO) 250 MG tablet Take 1 tablet (250 mg total) by mouth 2 (two) times daily. Take starting the day before nephrostomy tube exchange 6 tablet 0  . Continuous Blood Gluc Receiver (FREESTYLE LIBRE READER) DEVI Use 1 Units as directed. Check CBG's four times daily. Dx: E11.9    . Continuous Blood Gluc  Sensor (FREESTYLE LIBRE SENSOR SYSTEM) MISC Use 1 Units as directed. Check CBG's four times daily. Dx: E11.9    . digoxin (LANOXIN) 0.25 MG tablet Take 0.25 mg by mouth daily.     [name] ELIQUIS 5 MG TABS tablet Take 5 mg by mouth every 12 (twelve) hours.  0  . feeding supplement, GLUCERNA SHAKE, (Arion) LIQD Follow manufacturer instructions    . furosemide (LASIX) 40 MG tablet Take 40 mg by mouth daily.   0  . glimepiride (AMARYL) 4 MG tablet Take 1 tablet by mouth daily.    . insulin aspart (NOVOLOG) 100 UNIT/ML FlexPen Inject 7 Units into the skin 3 (three) times daily with meals.     [name]  Lancets 30G MISC Use 1 Units as directed. Check CBG's up to twice daily. Dx: E11.9    . LANTUS SOLOSTAR 100 UNIT/ML Solostar Pen Inject 20 Units into the skin at bedtime.   0  . losartan (COZAAR) 100 MG tablet Take 100 mg by mouth daily.    . metoprolol tartrate (LOPRESSOR) 25 MG tablet Take 25 mg by mouth 2 (two) times daily.     . pravastatin (PRAVACHOL) 20 MG tablet take 20mg by mouth daily at bedtime    . cyclobenzaprine (FEXMID) 7.5 MG tablet Take 1 tablet (7.5 mg total) 3 (three) times daily as needed by mouth for muscle spasms. (Patient not taking: Reported on 08/14/2017) 30 tablet 0  . HYDROcodone-acetaminophen (NORCO/VICODIN) 5-325 MG tablet Take 1 tablet by mouth every 12 (twelve) hours as needed for moderate pain. 45 tablet 0  . ondansetron (ZOFRAN) 8 MG tablet 1 pill every 8 hours for nausea/vomitting as needed (Patient not taking: Reported on 08/14/2017) 40 tablet 2  . prochlorperazine (COMPAZINE) 10 MG tablet Take 1 tablet (10 mg total) by mouth every 6 (six) hours as needed for nausea or vomiting. (Patient not taking: Reported on 08/14/2017) 40 tablet 1   No current facility-administered medications for this visit.       .  PHYSICAL EXAMINATION: ECOG PERFORMANCE STATUS: 0 - Asymptomatic  Vitals:   08/14/17 0915  BP: 135/80  Pulse: 74  Resp: 20  Temp: 97.9 F (36.6 C)   Filed  Weights   08/14/17 0919  Weight: 250 lb (113.4 kg)    GENERAL: Well-nourished well-developed; Alert, no distress and comfortable.  He is alone. EYES: no pallor or icterus OROPHARYNX: no thrush or ulceration; good dentition  NECK: supple, no masses felt LYMPH:  no palpable lymphadenopathy in the cervical, axillary or inguinal regions LUNGS: clear to auscultation and  No wheeze or crackles HEART/CVS: regular rate & rhythm and no murmurs; No lower extremity edema ABDOMEN: abdomen soft, non-tender and normal bowel sounds; colostomy in place. Bilateral nephrostomy tubes in place. Musculoskeletal:no cyanosis of digits and no clubbing  PSYCH: alert & oriented x 3 with fluent speech NEURO: no focal motor/sensory deficits SKIN:  no rashes or significant lesions  LABORATORY DATA:  I have reviewed the data as listed Lab Results  Component Value Date   WBC 8.3 08/14/2017   HGB 12.5 (L) 08/14/2017   HCT 35.5 (L) 08/14/2017   MCV 85.6 08/14/2017   PLT 195 08/14/2017   Recent Labs    07/17/17 0935 07/31/17 0812 08/14/17 0834  NA 135 136 134*  K 3.5 3.9 3.9  CL 104 104 103  CO2 26 24 23  GLUCOSE 187* 196* 246*  BUN 25* 20 24*  CREATININE 1.48* 1.44* 1.48*  CALCIUM 8.8* 8.7* 9.1  GFRNONAA 48* 49* 48*  GFRAA 55* 57* 55*  PROT 7.2 6.9 6.9  ALBUMIN 3.5 3.6 3.5  AST 16 18 19  ALT 14* 13* 17  ALKPHOS 74 63 70  BILITOT 0.7 0.6 0.7    RADIOGRAPHIC STUDIES: I have personally reviewed the radiological images as listed and agreed with the findings in the report. Ct Abdomen Pelvis Wo Contrast  Result Date: 08/13/2017 CLINICAL DATA:  66-year-old male with history of rectal cancer diagnosed in 2018 status post surgical resection, chemotherapy and radiation therapy. Follow-up study. EXAM: CT CHEST, ABDOMEN AND PELVIS WITHOUT CONTRAST TECHNIQUE: Multidetector CT imaging of the chest, abdomen and pelvis was performed following the standard protocol without IV contrast. COMPARISON:  CT of the    chest, abdomen and pelvis 05/06/2017. FINDINGS: CT CHEST FINDINGS Cardiovascular: Heart size is borderline enlarged. There is no significant pericardial fluid, thickening or pericardial calcification. There is aortic atherosclerosis, as well as atherosclerosis of the great vessels of the mediastinum and the coronary arteries, including calcified atherosclerotic plaque in the left main, left anterior descending, left circumflex and right coronary arteries. Calcifications of the aortic valve and mitral annulus. Right internal jugular single-lumen porta cath with tip terminating in the right atrium. Mediastinum/Nodes: No pathologically enlarged mediastinal or hilar lymph nodes. Please note that accurate exclusion of hilar adenopathy is limited on noncontrast CT scans. Esophagus is unremarkable in appearance. No axillary lymphadenopathy. Lungs/Pleura: No suspicious pulmonary nodules or masses are noted. No acute consolidative airspace disease. No pleural effusions. Scattered areas of mild septal thickening noted in the periphery of the extreme lung bases, unchanged compared to prior studies and nonspecific. Musculoskeletal: There are no aggressive appearing lytic or blastic lesions noted in the visualized portions of the skeleton. CT ABDOMEN PELVIS FINDINGS Hepatobiliary: No definite cystic or solid hepatic lesions are confidently identified on today's noncontrast CT examination. Unenhanced appearance of the gallbladder is unremarkable. Pancreas: No definite pancreatic mass or peripancreatic fluid or inflammatory changes are noted on today's noncontrast CT examination. Spleen: Unremarkable. Adrenals/Urinary Tract: Bilateral percutaneous nephrostomy tubes appear appropriately positioned in the renal pelvises bilaterally. No hydroureteronephrosis. Unenhanced appearance of the kidneys is otherwise unremarkable. No unexpected perinephric fluid collections. Urinary bladder is nearly completely decompressed and appears  slightly thick walled, similar to the prior study. Small amount of gas in the lumen of the urinary bladder evident on the prior examination is no longer identified on today's study. Bilateral adrenal glands are normal in appearance. Stomach/Bowel: Unenhanced appearance of the stomach is normal. There is no pathologic dilatation of small bowel or colon. Status post abdominoperineal resection with left lower quadrant colostomy. There continues to be a mixed soft tissue attenuation and fluid mass in the presacral space which appears very similar to the prior study measuring up to 4.7 x 8.1 cm on today's examination (axial image 111 of series 2). Vascular/Lymphatic: Aortic atherosclerosis without definite aneurysm in the abdominal or pelvic vasculature. Stent in the left common iliac vein. No lymphadenopathy noted in the abdomen or pelvis. Several prominent but nonenlarged retroperitoneal lymph nodes appear similar to the prior study and are presumably chronic and reactive. Reproductive: Prostate gland is obscured by the postoperative and post treatment related changes in the pelvis, similar to the prior study. Other: No significant volume of ascites.  No pneumoperitoneum. Musculoskeletal: There are no aggressive appearing lytic or blastic lesions noted in the visualized portions of the skeleton. IMPRESSION: 1. Status post abdominoperineal resection and pelvic radiation therapy with chronic postoperative thick-walled presacral fluid collection intimately associated with the adjacent prostate gland and urinary bladder, overall appearing very similar to the prior study. Previously noted areas of lymphadenopathy have continued to regress. Today's noncontrast CT examination now demonstrates no compelling areas to suggest metastatic disease in the chest, abdomen or pelvis. 2. Aortic atherosclerosis, in addition to left main and 3 vessel coronary artery disease. Please note that although the presence of coronary artery  calcium documents the presence of coronary artery disease, the severity of this disease and any potential stenosis cannot be assessed on this non-gated CT examination. Assessment for potential risk factor modification, dietary therapy or pharmacologic therapy may be warranted, if clinically indicated. 3. There are calcifications of the aortic valve and mitral annulus. Echocardiographic correlation for evaluation of potential   valvular dysfunction may be warranted if clinically indicated. 4. Additional incidental findings, as above. Aortic Atherosclerosis (ICD10-I70.0). Electronically Signed   By: Daniel  Entrikin M.D.   On: 08/13/2017 10:11   Ct Chest Wo Contrast  Result Date: 08/13/2017 CLINICAL DATA:  66-year-old male with history of rectal cancer diagnosed in 2018 status post surgical resection, chemotherapy and radiation therapy. Follow-up study. EXAM: CT CHEST, ABDOMEN AND PELVIS WITHOUT CONTRAST TECHNIQUE: Multidetector CT imaging of the chest, abdomen and pelvis was performed following the standard protocol without IV contrast. COMPARISON:  CT of the chest, abdomen and pelvis 05/06/2017. FINDINGS: CT CHEST FINDINGS Cardiovascular: Heart size is borderline enlarged. There is no significant pericardial fluid, thickening or pericardial calcification. There is aortic atherosclerosis, as well as atherosclerosis of the great vessels of the mediastinum and the coronary arteries, including calcified atherosclerotic plaque in the left main, left anterior descending, left circumflex and right coronary arteries. Calcifications of the aortic valve and mitral annulus. Right internal jugular single-lumen porta cath with tip terminating in the right atrium. Mediastinum/Nodes: No pathologically enlarged mediastinal or hilar lymph nodes. Please note that accurate exclusion of hilar adenopathy is limited on noncontrast CT scans. Esophagus is unremarkable in appearance. No axillary lymphadenopathy. Lungs/Pleura: No  suspicious pulmonary nodules or masses are noted. No acute consolidative airspace disease. No pleural effusions. Scattered areas of mild septal thickening noted in the periphery of the extreme lung bases, unchanged compared to prior studies and nonspecific. Musculoskeletal: There are no aggressive appearing lytic or blastic lesions noted in the visualized portions of the skeleton. CT ABDOMEN PELVIS FINDINGS Hepatobiliary: No definite cystic or solid hepatic lesions are confidently identified on today's noncontrast CT examination. Unenhanced appearance of the gallbladder is unremarkable. Pancreas: No definite pancreatic mass or peripancreatic fluid or inflammatory changes are noted on today's noncontrast CT examination. Spleen: Unremarkable. Adrenals/Urinary Tract: Bilateral percutaneous nephrostomy tubes appear appropriately positioned in the renal pelvises bilaterally. No hydroureteronephrosis. Unenhanced appearance of the kidneys is otherwise unremarkable. No unexpected perinephric fluid collections. Urinary bladder is nearly completely decompressed and appears slightly thick walled, similar to the prior study. Small amount of gas in the lumen of the urinary bladder evident on the prior examination is no longer identified on today's study. Bilateral adrenal glands are normal in appearance. Stomach/Bowel: Unenhanced appearance of the stomach is normal. There is no pathologic dilatation of small bowel or colon. Status post abdominoperineal resection with left lower quadrant colostomy. There continues to be a mixed soft tissue attenuation and fluid mass in the presacral space which appears very similar to the prior study measuring up to 4.7 x 8.1 cm on today's examination (axial image 111 of series 2). Vascular/Lymphatic: Aortic atherosclerosis without definite aneurysm in the abdominal or pelvic vasculature. Stent in the left common iliac vein. No lymphadenopathy noted in the abdomen or pelvis. Several prominent but  nonenlarged retroperitoneal lymph nodes appear similar to the prior study and are presumably chronic and reactive. Reproductive: Prostate gland is obscured by the postoperative and post treatment related changes in the pelvis, similar to the prior study. Other: No significant volume of ascites.  No pneumoperitoneum. Musculoskeletal: There are no aggressive appearing lytic or blastic lesions noted in the visualized portions of the skeleton. IMPRESSION: 1. Status post abdominoperineal resection and pelvic radiation therapy with chronic postoperative thick-walled presacral fluid collection intimately associated with the adjacent prostate gland and urinary bladder, overall appearing very similar to the prior study. Previously noted areas of lymphadenopathy have continued to regress. Today's noncontrast CT examination now   demonstrates no compelling areas to suggest metastatic disease in the chest, abdomen or pelvis. 2. Aortic atherosclerosis, in addition to left main and 3 vessel coronary artery disease. Please note that although the presence of coronary artery calcium documents the presence of coronary artery disease, the severity of this disease and any potential stenosis cannot be assessed on this non-gated CT examination. Assessment for potential risk factor modification, dietary therapy or pharmacologic therapy may be warranted, if clinically indicated. 3. There are calcifications of the aortic valve and mitral annulus. Echocardiographic correlation for evaluation of potential valvular dysfunction may be warranted if clinically indicated. 4. Additional incidental findings, as above. Aortic Atherosclerosis (ICD10-I70.0). Electronically Signed   By: Vinnie Langton M.D.   On: 08/13/2017 10:11   Ir Nephrostomy Exchange Left  Result Date: 07/26/2017 INDICATION: History of rectal carcinoma with urinary diversion with bilateral nephrostomy catheters EXAM: IR EXCHANGE NEPHROSTOMY RIGHT; IR EXCHANGE NEPHROSTOMY LEFT  COMPARISON:  None. MEDICATIONS: None ANESTHESIA/SEDATION: None CONTRAST:  95m ISOVUE-300 FLUOROSCOPY TIME:  Fluoroscopy Time: 1 minutes COMPLICATIONS: None immediate. PROCEDURE: Informed written consent was obtained from the patient after a thorough discussion of the procedural risks, benefits and alternatives. All questions were addressed. Maximal Sterile Barrier Technique was utilized including caps, mask, sterile gowns, sterile gloves, sterile drape, hand hygiene and skin antiseptic. A timeout was performed prior to the initiation of the procedure. Contrast material was not initially injected into the left collecting system two documented nephrostomy placement. The nephrostomy catheter was then removed over a Bentson wire and exchanged for a new 10 French nephrostomy catheter. This was coiled in the left renal pelvis. Contrast in check shin was then performed to confirm catheter position. The catheter was then affixed to the skin surface utilizing 0 Prolene and a Hopkins buttress. Subsequently utilizing a similar technique the right nephrostomy catheter was exchanged for a new 10 French nephrostomy catheter. The catheter was fixed in place utilizing 0 Prolene and a Hopkins buttress. Catheters were then dressed in the standard manner. The patient tolerated the procedure well and was returned his room in satisfactory condition. IMPRESSION: Successful exchange of bilateral nephrostomy catheters as described. Electronically Signed   By: MInez CatalinaM.D.   On: 07/26/2017 09:37   Ir Nephrostomy Exchange Right  Result Date: 07/26/2017 INDICATION: History of rectal carcinoma with urinary diversion with bilateral nephrostomy catheters EXAM: IR EXCHANGE NEPHROSTOMY RIGHT; IR EXCHANGE NEPHROSTOMY LEFT COMPARISON:  None. MEDICATIONS: None ANESTHESIA/SEDATION: None CONTRAST:  171mISOVUE-300 FLUOROSCOPY TIME:  Fluoroscopy Time: 1 minutes COMPLICATIONS: None immediate. PROCEDURE: Informed written consent was obtained  from the patient after a thorough discussion of the procedural risks, benefits and alternatives. All questions were addressed. Maximal Sterile Barrier Technique was utilized including caps, mask, sterile gowns, sterile gloves, sterile drape, hand hygiene and skin antiseptic. A timeout was performed prior to the initiation of the procedure. Contrast material was not initially injected into the left collecting system two documented nephrostomy placement. The nephrostomy catheter was then removed over a Bentson wire and exchanged for a new 10 French nephrostomy catheter. This was coiled in the left renal pelvis. Contrast in check shin was then performed to confirm catheter position. The catheter was then affixed to the skin surface utilizing 0 Prolene and a Hopkins buttress. Subsequently utilizing a similar technique the right nephrostomy catheter was exchanged for a new 10 French nephrostomy catheter. The catheter was fixed in place utilizing 0 Prolene and a Hopkins buttress. Catheters were then dressed in the standard manner. The patient tolerated the  procedure well and was returned his room in satisfactory condition. IMPRESSION: Successful exchange of bilateral nephrostomy catheters as described. Electronically Signed   By: Mark  Lukens M.D.   On: 07/26/2017 09:37    Results for Coop, Yusif SCOTT (MRN [id number]) as of 07/17/2017 10:22  Ref. Range 04/10/2017 08:55 04/24/2017 08:27 05/22/2017 08:46 06/18/2017 09:09 07/03/2017 09:57  CEA Latest Ref Range: 0.0 - 4.7 ng/mL 93.2 (H) 53.4 (H) 10.0 (H) 5.9 (H) 4.7   IMPRESSION: 1. Status post abdominoperineal resection and pelvic radiation therapy with chronic postoperative thick-walled presacral fluid collection intimately associated with the adjacent prostate gland and urinary bladder, overall appearing very similar to the prior study. Previously noted areas of lymphadenopathy have continued to regress. Today's noncontrast CT examination now demonstrates  no compelling areas to suggest metastatic disease in the chest, abdomen or pelvis. 2. Aortic atherosclerosis, in addition to left main and 3 vessel coronary artery disease. Please note that although the presence of coronary artery calcium documents the presence of coronary artery disease, the severity of this disease and any potential stenosis cannot be assessed on this non-gated CT examination. Assessment for potential risk factor modification, dietary therapy or pharmacologic therapy may be warranted, if clinically indicated. 3. There are calcifications of the aortic valve and mitral annulus. Echocardiographic correlation for evaluation of potential valvular dysfunction may be warranted if clinically indicated. 4. Additional incidental findings, as above.  Aortic Atherosclerosis (ICD10-I70.0).   Electronically Signed   By: Daniel  Ent[place] M.D.   On: 08/13/2017 10:11    ASSESSMENT & PLAN:   Rectal cancer (HCC) # RECURRENT RECTAL CANCER STAGE IV- [s/p LN Bx];  currently on 5FU infusion-IRI;  CT Jan 2019- partial response-improvement of the supraclavicular adenopathy mediastinal adenopathy retroperitoneal adenopathy.  Stable to slightly smaller pelvic necrotic mass. CEA improving ~4.7.  # Proceed with FOLFIRI # 12 today  [irinotecan-150 mg/m square; 5-FU 2200 mg/m square-]. Labs today reviewed;  acceptable for treatment today.   #  CKD- secondary to bilateral hydronephrosis from the underlying pelvic mass. S/p PNC-recent change of tubes [April 2019]  Bil.creatinine at 1.4-STABLE.   # Back pain- secondary to pelvic mass/PCN; continue hydrocodone as needed. STABLE.   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   # DM-2 ; on insulin/ amaryl; On Lantus. Better controlled.   # follow up in 3 weeks/labs;FOLFIRI/labs/CEA     Govinda R Brahmanday, MD 08/16/2017 12:19 AM 

## 2017-08-15 LAB — CEA: CEA1: 3.6 ng/mL (ref 0.0–4.7)

## 2017-08-16 ENCOUNTER — Inpatient Hospital Stay: Payer: Medicare Other

## 2017-08-16 VITALS — BP 106/67 | HR 79 | Temp 97.7°F | Resp 20

## 2017-08-16 DIAGNOSIS — G893 Neoplasm related pain (acute) (chronic): Secondary | ICD-10-CM | POA: Diagnosis not present

## 2017-08-16 DIAGNOSIS — Z452 Encounter for adjustment and management of vascular access device: Secondary | ICD-10-CM | POA: Diagnosis not present

## 2017-08-16 DIAGNOSIS — E119 Type 2 diabetes mellitus without complications: Secondary | ICD-10-CM | POA: Diagnosis not present

## 2017-08-16 DIAGNOSIS — C20 Malignant neoplasm of rectum: Secondary | ICD-10-CM | POA: Diagnosis not present

## 2017-08-16 DIAGNOSIS — N189 Chronic kidney disease, unspecified: Secondary | ICD-10-CM | POA: Diagnosis not present

## 2017-08-16 DIAGNOSIS — Z5111 Encounter for antineoplastic chemotherapy: Secondary | ICD-10-CM | POA: Diagnosis not present

## 2017-08-16 MED ORDER — SODIUM CHLORIDE 0.9% FLUSH
10.0000 mL | INTRAVENOUS | Status: DC | PRN
  Administered 2017-08-16: 10 mL
  Filled 2017-08-16: qty 10

## 2017-08-16 MED ORDER — HEPARIN SOD (PORK) LOCK FLUSH 100 UNIT/ML IV SOLN
500.0000 [IU] | Freq: Once | INTRAVENOUS | Status: AC | PRN
  Administered 2017-08-16: 500 [IU]
  Filled 2017-08-16: qty 5

## 2017-08-20 ENCOUNTER — Telehealth: Payer: Self-pay | Admitting: *Deleted

## 2017-08-20 NOTE — Telephone Encounter (Signed)
I spoke with Dr. Rogue Bussing. MD states pt's counts are stable and MD approved dental cleaning at this time. I contacted [facility]- v/o given to dental office to proceed with dental cleaning. Patient has an apt tomorrow on the schedule. She will let the dentist know.

## 2017-08-20 NOTE — Telephone Encounter (Signed)
Patient's daughter Nira Conn, who is a Copywriter, advertising, called to see if Dr. B would allow patient to have his teeth cleaned. She stated that she knows we don't normally allow patients to have any dental work done while on chemotherapy, but in his case, he will be on chemo indefinitely, and she has noticed some mouth odor associated with plaque and doesn't want it to progress to periodontal disease. She was hoping if his blood counts were good enough that Dr. B would make an exception. Please call [phone] ([place]) and let them know if it is ok or not.        dhs

## 2017-08-28 ENCOUNTER — Encounter: Payer: Self-pay | Admitting: Urology

## 2017-08-29 ENCOUNTER — Encounter: Payer: Self-pay | Admitting: Radiology

## 2017-08-29 ENCOUNTER — Encounter: Payer: Self-pay | Admitting: Urology

## 2017-08-29 DIAGNOSIS — R1031 Right lower quadrant pain: Secondary | ICD-10-CM | POA: Diagnosis not present

## 2017-09-04 ENCOUNTER — Inpatient Hospital Stay: Payer: Medicare Other

## 2017-09-04 ENCOUNTER — Inpatient Hospital Stay: Payer: Medicare Other | Attending: Internal Medicine

## 2017-09-04 ENCOUNTER — Inpatient Hospital Stay (HOSPITAL_BASED_OUTPATIENT_CLINIC_OR_DEPARTMENT_OTHER): Payer: Medicare Other | Admitting: Internal Medicine

## 2017-09-04 VITALS — BP 122/81 | HR 62 | Temp 97.9°F | Resp 20 | Ht 70.5 in | Wt 251.0 lb

## 2017-09-04 DIAGNOSIS — E119 Type 2 diabetes mellitus without complications: Secondary | ICD-10-CM | POA: Insufficient documentation

## 2017-09-04 DIAGNOSIS — Z794 Long term (current) use of insulin: Secondary | ICD-10-CM | POA: Diagnosis not present

## 2017-09-04 DIAGNOSIS — M7989 Other specified soft tissue disorders: Secondary | ICD-10-CM

## 2017-09-04 DIAGNOSIS — Z79899 Other long term (current) drug therapy: Secondary | ICD-10-CM

## 2017-09-04 DIAGNOSIS — Z452 Encounter for adjustment and management of vascular access device: Secondary | ICD-10-CM | POA: Insufficient documentation

## 2017-09-04 DIAGNOSIS — Z7901 Long term (current) use of anticoagulants: Secondary | ICD-10-CM | POA: Diagnosis not present

## 2017-09-04 DIAGNOSIS — N189 Chronic kidney disease, unspecified: Secondary | ICD-10-CM

## 2017-09-04 DIAGNOSIS — Z86718 Personal history of other venous thrombosis and embolism: Secondary | ICD-10-CM | POA: Insufficient documentation

## 2017-09-04 DIAGNOSIS — R1011 Right upper quadrant pain: Secondary | ICD-10-CM

## 2017-09-04 DIAGNOSIS — Z8042 Family history of malignant neoplasm of prostate: Secondary | ICD-10-CM

## 2017-09-04 DIAGNOSIS — C20 Malignant neoplasm of rectum: Secondary | ICD-10-CM

## 2017-09-04 DIAGNOSIS — R530 Neoplastic (malignant) related fatigue: Secondary | ICD-10-CM | POA: Insufficient documentation

## 2017-09-04 DIAGNOSIS — Z803 Family history of malignant neoplasm of breast: Secondary | ICD-10-CM

## 2017-09-04 DIAGNOSIS — G893 Neoplasm related pain (acute) (chronic): Secondary | ICD-10-CM | POA: Diagnosis not present

## 2017-09-04 DIAGNOSIS — Z5111 Encounter for antineoplastic chemotherapy: Secondary | ICD-10-CM | POA: Diagnosis not present

## 2017-09-04 LAB — CBC WITH DIFFERENTIAL/PLATELET
BASOS ABS: 0.1 10*3/uL (ref 0–0.1)
BASOS PCT: 1 %
Eosinophils Absolute: 0.2 10*3/uL (ref 0–0.7)
Eosinophils Relative: 3 %
HEMATOCRIT: 36.9 % — AB (ref 40.0–52.0)
HEMOGLOBIN: 13.1 g/dL (ref 13.0–18.0)
Lymphocytes Relative: 11 %
Lymphs Abs: 0.9 10*3/uL — ABNORMAL LOW (ref 1.0–3.6)
MCH: 30.5 pg (ref 26.0–34.0)
MCHC: 35.5 g/dL (ref 32.0–36.0)
MCV: 85.9 fL (ref 80.0–100.0)
Monocytes Absolute: 0.8 10*3/uL (ref 0.2–1.0)
Monocytes Relative: 11 %
NEUTROS ABS: 5.9 10*3/uL (ref 1.4–6.5)
NEUTROS PCT: 74 %
Platelets: 251 10*3/uL (ref 150–440)
RBC: 4.29 MIL/uL — AB (ref 4.40–5.90)
RDW: 16.3 % — ABNORMAL HIGH (ref 11.5–14.5)
WBC: 7.9 10*3/uL (ref 3.8–10.6)

## 2017-09-04 LAB — COMPREHENSIVE METABOLIC PANEL
ALBUMIN: 3.4 g/dL — AB (ref 3.5–5.0)
ALK PHOS: 72 U/L (ref 38–126)
ALT: 17 U/L (ref 17–63)
AST: 17 U/L (ref 15–41)
Anion gap: 10 (ref 5–15)
BILIRUBIN TOTAL: 0.9 mg/dL (ref 0.3–1.2)
BUN: 24 mg/dL — ABNORMAL HIGH (ref 6–20)
CO2: 23 mmol/L (ref 22–32)
CREATININE: 1.52 mg/dL — AB (ref 0.61–1.24)
Calcium: 9 mg/dL (ref 8.9–10.3)
Chloride: 103 mmol/L (ref 101–111)
GFR calc Af Amer: 53 mL/min — ABNORMAL LOW (ref >60)
GFR calc non Af Amer: 46 mL/min — ABNORMAL LOW (ref >60)
GLUCOSE: 227 mg/dL — AB (ref 65–99)
POTASSIUM: 4 mmol/L (ref 3.5–5.1)
Sodium: 136 mmol/L (ref 135–145)
TOTAL PROTEIN: 7 g/dL (ref 6.5–8.1)

## 2017-09-04 MED ORDER — SODIUM CHLORIDE 0.9 % IV SOLN
5000.0000 mg | INTRAVENOUS | Status: DC
  Administered 2017-09-04: 5000 mg via INTRAVENOUS
  Filled 2017-09-04: qty 100

## 2017-09-04 MED ORDER — ATROPINE SULFATE 1 MG/ML IJ SOLN
0.5000 mg | Freq: Once | INTRAMUSCULAR | Status: AC | PRN
  Administered 2017-09-04: 0.5 mg via INTRAVENOUS
  Filled 2017-09-04: qty 1

## 2017-09-04 MED ORDER — HYDROCODONE-ACETAMINOPHEN 5-325 MG PO TABS: 1.0000 | ORAL_TABLET | Freq: Three times a day (TID) | ORAL | 0 refills | Status: DC | PRN

## 2017-09-04 MED ORDER — LEUCOVORIN CALCIUM INJECTION 350 MG
1000.0000 mg | Freq: Once | INTRAVENOUS | Status: AC
  Administered 2017-09-04: 1000 mg via INTRAVENOUS
  Filled 2017-09-04: qty 50

## 2017-09-04 MED ORDER — SODIUM CHLORIDE 0.9 % IV SOLN
Freq: Once | INTRAVENOUS | Status: AC
  Administered 2017-09-04: 09:00:00 via INTRAVENOUS
  Filled 2017-09-04: qty 1000

## 2017-09-04 MED ORDER — IRINOTECAN HCL CHEMO INJECTION 100 MG/5ML
150.0000 mg/m2 | Freq: Once | INTRAVENOUS | Status: AC
  Administered 2017-09-04: 360 mg via INTRAVENOUS
  Filled 2017-09-04: qty 15

## 2017-09-04 MED ORDER — PALONOSETRON HCL INJECTION 0.25 MG/5ML
0.2500 mg | Freq: Once | INTRAVENOUS | Status: AC
  Administered 2017-09-04: 0.25 mg via INTRAVENOUS
  Filled 2017-09-04: qty 5

## 2017-09-04 MED ORDER — SODIUM CHLORIDE 0.9% FLUSH
10.0000 mL | INTRAVENOUS | Status: DC | PRN
  Administered 2017-09-04: 10 mL via INTRAVENOUS
  Filled 2017-09-04: qty 10

## 2017-09-04 MED ORDER — HEPARIN SOD (PORK) LOCK FLUSH 100 UNIT/ML IV SOLN: 500.0000 [IU] | Freq: Once | INTRAVENOUS | Status: DC

## 2017-09-04 NOTE — Progress Notes (Signed)
[facility] OFFICE PROGRESS NOTE  Patient Care Team: Dion Body, MD as PCP - General (Family Medicine) Clent Jacks, RN as Registered Nurse  Cancer Staging No matching staging information was found for the patient.   Oncology History   # 2012- RECTO-SIGMOID-poorly diff STAGE III CA [s/p neo-adj chemo-RT- poor response]; APR [residual ypT3; 5/8 LN positiveDr.Smith/Dr.Pandit]  S/p FOLFOX   # SEP 2019- Pre-sacral fluid/mass- increasing ~ 5-6 cm [increased from dec 2017; also NEW retroperitoneal lymph nodes; inguinal adenopathy]; SEP 2018- left supraclavicular; mediastinal; right hilar-retroperitoneal; pelvic mass/node; bilateral inguinal adenopathy; s/p right Ingiunal LNBx - RECURRENT CA [necrotic;insuff tissue for F-One]  # OCT 8th 2018-FOLFIRI [first cycle 5FU alone]; OCT 22nd FOLFIRI; April 2019- significant PR  # MAY 2019- FOLFIRI q 3W [pt pref]  ------------------------------  ---  OCT-NOV 2018-Sepsis x2 sec to UTI [s/p ABx;]  # s/p RT rectal [11/28-last treatment]  # Acute renal insuff [s/p bil PCN]; hematuria- sec to bladder invol [? Contra-indication to avastin]  # LEFT LE DVT [? May [facility]- Dr.Schneir]; Eliquis.   # CHF/COPD/CKD/ PN sec to [place]  # [facility] One-PDL-1/ TPS- 0% [2012-rectal specimen];12/26-Omniseq- K-RAS MUTATED;MSS; no targets**     Rectal cancer (HCC)      INTERVAL HISTORY:  Donald Townsend 67 y.o.  male pleasant patient above history of metastatic rectal cancer currently on FOLFIRI is here for follow-up.  Patient complains of right upper quadrant abdominal pain going on for the last few days.  Intermittent/spontaneous no precipitating factors.  Denies any constipation or diarrhea.   Denies any chest pain.  Or shortness of breath or cough.  Review of Systems  Constitutional: Positive for malaise/fatigue. Negative for chills, diaphoresis, fever and weight loss.  HENT: Negative for nosebleeds and sore throat.    Eyes: Negative for double vision.  Respiratory: Negative for cough, hemoptysis, sputum production, shortness of breath and wheezing.   Cardiovascular: Negative for chest pain, palpitations, orthopnea and leg swelling.  Gastrointestinal: Positive for abdominal pain. Negative for blood in stool, constipation, diarrhea, heartburn, melena, nausea and vomiting.  Genitourinary: Negative for dysuria, frequency and urgency.  Musculoskeletal: Negative for back pain and joint pain.  Skin: Negative.  Negative for itching and rash.  Neurological: Negative for dizziness, tingling, focal weakness, weakness and headaches.  Endo/Heme/Allergies: Does not bruise/bleed easily.  Psychiatric/Behavioral: Negative for depression. The patient is not nervous/anxious and does not have insomnia.       PAST MEDICAL HISTORY :  Past Medical History:  Diagnosis Date  . A-fib ([place])   . Cancer [facility])    Colon  . CHF (congestive heart failure) ([place])   . Diabetes mellitus without complication ([place])   . Dyspnea   . Elevated lipids   . Hypertension   . PVD (peripheral vascular disease) ([place])     PAST SURGICAL HISTORY :   Past Surgical History:  Procedure Laterality Date  . COLON RESECTION     with colostomy  . COLONOSCOPY    . CYSTOSCOPY WITH STENT PLACEMENT Bilateral 01/18/2017   Procedure: CYSTOSCOPY WITH STENT PLACEMENT;  Surgeon: [name], MD;  Location: [facility] ORS;  Service: Urology;  Laterality: Bilateral;  . IR FLUORO GUIDE PORT INSERTION RIGHT  01/24/2017  . IR NEPHROSTOMY EXCHANGE LEFT  03/14/2017  . IR NEPHROSTOMY EXCHANGE LEFT  05/03/2017  . IR NEPHROSTOMY EXCHANGE LEFT  06/14/2017  . IR NEPHROSTOMY EXCHANGE LEFT  07/26/2017  . IR NEPHROSTOMY EXCHANGE RIGHT  03/14/2017  . IR NEPHROSTOMY EXCHANGE RIGHT  05/03/2017  . IR NEPHROSTOMY EXCHANGE RIGHT  06/14/2017  . IR NEPHROSTOMY EXCHANGE RIGHT  07/26/2017  . IR NEPHROSTOMY PLACEMENT LEFT  01/19/2017  . IR NEPHROSTOMY PLACEMENT RIGHT  01/19/2017  . IVC  FILTER REMOVAL N/A 08/28/2016   Procedure: IVC Filter Removal;  Surgeon: Katha Cabal, MD;  Location: [facility];  Service: Cardiovascular;  Laterality: N/A;  . LOWER EXTREMITY VENOGRAPHY Left 12/18/2016   Procedure: Lower Extremity Venography;  Surgeon: Katha Cabal, MD;  Location: [facility] [facility];  Service: Cardiovascular;  Laterality: Left;  . PERIPHERAL VASCULAR CATHETERIZATION Left 05/08/2016   Procedure: Lower Extremity Venography;  Surgeon: Katha Cabal, MD;  Location: [facility];  Service: Cardiovascular;  Laterality: Left;    FAMILY HISTORY :   Family History  Problem Relation Age of Onset  . Diabetes Mother   . Stroke Mother   . Prostate cancer Brother   . Cancer Sister        Breast cancer  . Kidney cancer Neg Hx   . Bladder Cancer Neg Hx     SOCIAL HISTORY:   Social History   Tobacco Use  . Smoking status: Never Smoker  . Smokeless tobacco: Never Used  Substance Use Topics  . Alcohol use: No  . Drug use: No    ALLERGIES:  has No Known Allergies.  MEDICATIONS:  Current Outpatient Medications  Medication Sig Dispense Refill  . acidophilus (RISAQUAD) CAPS capsule Take 1 capsule daily by mouth. 30 capsule 0  . B-D ULTRAFINE III SHORT PEN 31G X 8 MM MISC     . Continuous Blood Gluc Receiver (FREESTYLE LIBRE READER) DEVI Use 1 Units as directed. Check CBG's four times daily. Dx: E11.9    . Continuous Blood Gluc Sensor (FREESTYLE LIBRE SENSOR SYSTEM) MISC Use 1 Units as directed. Check CBG's four times daily. Dx: E11.9    . digoxin (LANOXIN) 0.25 MG tablet Take 0.25 mg by mouth daily.     [name] ELIQUIS 5 MG TABS tablet Take 5 mg by mouth every 12 (twelve) hours.  0  . feeding supplement, GLUCERNA SHAKE, ([place]) LIQD Follow manufacturer instructions    . furosemide (LASIX) 40 MG tablet Take 40 mg by mouth daily.   0  . glimepiride (AMARYL) 4 MG tablet Take 1 tablet by mouth daily.    [name] HYDROcodone-acetaminophen (NORCO/VICODIN)  5-325 MG tablet Take 1 tablet by mouth every 8 (eight) hours as needed for moderate pain. 60 tablet 0  . insulin aspart (NOVOLOG) 100 UNIT/ML FlexPen Inject 7 Units into the skin 3 (three) times daily with meals.     . Lancets 30G MISC Use 1 Units as directed. Check CBG's up to twice daily. Dx: E11.9    . LANTUS SOLOSTAR 100 UNIT/ML Solostar Pen Inject 20 Units into the skin at bedtime.   0  . losartan (COZAAR) 100 MG tablet Take 100 mg by mouth daily.    . metoprolol tartrate (LOPRESSOR) 25 MG tablet Take 25 mg by mouth 2 (two) times daily.     . ondansetron (ZOFRAN) 8 MG tablet 1 pill every 8 hours for nausea/vomitting as needed 40 tablet 2  . pravastatin (PRAVACHOL) 20 MG tablet take 62m by mouth daily at bedtime    . ciprofloxacin (CIPRO) 250 MG tablet Take 1 tablet (250 mg total) by mouth 2 (two) times daily. Take starting the day before nephrostomy tube exchange (Patient not taking: Reported on 09/04/2017) 6 tablet 0  . prochlorperazine (COMPAZINE)  10 MG tablet Take 1 tablet (10 mg total) by mouth every 6 (six) hours as needed for nausea or vomiting. (Patient not taking: Reported on 08/14/2017) 40 tablet 1   No current facility-administered medications for this visit.     PHYSICAL EXAMINATION: ECOG PERFORMANCE STATUS: 1 - Symptomatic but completely ambulatory  BP 122/81 (BP Location: Right Arm, Patient Position: Sitting)   Pulse 62   Temp 97.9 F (36.6 C) (Tympanic)   Resp 20   Ht 5' 10.5" (1.791 m)   Wt 251 lb (113.9 kg)   BMI 35.51 kg/m   Filed Weights   09/04/17 0850  Weight: 251 lb (113.9 kg)    GENERAL: Well-nourished well-developed; Alert, no distress and comfortable.  He is alone. EYES: no pallor or icterus OROPHARYNX: no thrush or ulceration; NECK: supple; no lymph nodes felt. LYMPH:  no palpable lymphadenopathy in the axillary or inguinal regions LUNGS: Decreased breath sounds auscultation bilaterally. No wheeze or crackles HEART/CVS: regular rate & rhythm and no  murmurs; No lower extremity edema ABDOMEN:abdomen soft, non-tender and normal bowel sounds. No hepatomegaly or splenomegaly.  Bilateral percutaneous nephrostomy tubes. Musculoskeletal:no cyanosis of digits and no clubbing  PSYCH: alert & oriented x 3 with fluent speech NEURO: no focal motor/sensory deficits SKIN:  no rashes or significant lesions    LABORATORY DATA:  I have reviewed the data as listed    Component Value Date/Time   NA 136 09/04/2017 0825   NA 139 12/17/2012 0608   K 4.0 09/04/2017 0825   K 3.8 12/17/2012 0608   CL 103 09/04/2017 0825   CL 108 (H) 12/17/2012 0608   CO2 23 09/04/2017 0825   CO2 26 12/17/2012 0608   GLUCOSE 227 (H) 09/04/2017 0825   GLUCOSE 217 (H) 12/17/2012 0608   BUN 24 (H) 09/04/2017 0825   BUN 21 01/01/2017 1621   BUN 15 12/17/2012 0608   CREATININE 1.52 (H) 09/04/2017 0825   CREATININE 0.84 12/17/2012 0608   CALCIUM 9.0 09/04/2017 0825   CALCIUM 8.2 (L) 12/17/2012 0608   PROT 7.0 09/04/2017 0825   PROT 5.8 (L) 12/17/2012 0608   ALBUMIN 3.4 (L) 09/04/2017 0825   ALBUMIN 2.9 (L) 12/17/2012 0608   AST 17 09/04/2017 0825   AST 16 12/17/2012 0608   ALT 17 09/04/2017 0825   ALT 23 12/17/2012 0608   ALKPHOS 72 09/04/2017 0825   ALKPHOS 78 12/17/2012 0608   BILITOT 0.9 09/04/2017 0825   BILITOT 0.7 12/17/2012 0608   GFRNONAA 46 (L) 09/04/2017 0825   GFRNONAA >60 12/17/2012 0608   GFRAA 53 (L) 09/04/2017 0825   GFRAA >60 12/17/2012 0608    No results found for: SPEP, UPEP  Lab Results  Component Value Date   WBC 7.9 09/04/2017   NEUTROABS 5.9 09/04/2017   HGB 13.1 09/04/2017   HCT 36.9 (L) 09/04/2017   MCV 85.9 09/04/2017   PLT 251 09/04/2017      Chemistry      Component Value Date/Time   NA 136 09/04/2017 0825   NA 139 12/17/2012 0608   K 4.0 09/04/2017 0825   K 3.8 12/17/2012 0608   CL 103 09/04/2017 0825   CL 108 (H) 12/17/2012 0608   CO2 23 09/04/2017 0825   CO2 26 12/17/2012 0608   BUN 24 (H) 09/04/2017 0825    BUN 21 01/01/2017 1621   BUN 15 12/17/2012 0608   CREATININE 1.52 (H) 09/04/2017 0825   CREATININE 0.84 12/17/2012 0608      Component  Value Date/Time   CALCIUM 9.0 09/04/2017 0825   CALCIUM 8.2 (L) 12/17/2012 0608   ALKPHOS 72 09/04/2017 0825   ALKPHOS 78 12/17/2012 0608   AST 17 09/04/2017 0825   AST 16 12/17/2012 0608   ALT 17 09/04/2017 0825   ALT 23 12/17/2012 0608   BILITOT 0.9 09/04/2017 0825   BILITOT 0.7 12/17/2012 0608       RADIOGRAPHIC STUDIES: I have personally reviewed the radiological images as listed and agreed with the findings in the report. No results found.   ASSESSMENT & PLAN:  Rectal cancer [facility]) # RECURRENT RECTAL CANCER STAGE IV: CT scan April 2019 significant response to therapy.   # Continue with FOLFIRI; CBC CMP are reviewed; adequate today; no contraindications to chemotherapy. Proceed with treatment today.    #Right upper quadrant abdominal pain-question etiology.  Recommend ultrasound of the abdomen.   #Chronic kidney disease bilateral hydronephrosis/; bilateral nephrostomy tubes [exchanged   #  CKD- secondary to bilateral hydronephrosis from the underlying pelvic mass. S/p PNC-recent change of tubes [April 2019]  Bil.creatinine at 1.4-STABLE.   # Back pain- secondary to pelvic mass/PCN; continue hydrocodone as needed. STABLE.   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   # DM-2 ; on insulin/ amaryl; On Lantus. Better controlled.   # follow up 3 weeks/labs/;MD/  [place] ASAP;    Orders Placed This Encounter  Procedures  . US Abdomen Complete    Standing Status:   Future    Number of Occurrences:   1    Standing Expiration Date:   09/04/2018    Order Specific Question:   Reason for Exam (SYMPTOM  OR DIAGNOSIS REQUIRED)    Answer:   right upper quadrant/ right peri-umbilical abodminal pain    Order Specific Question:   Preferred imaging location?    Answer:   [facility]  . CBC with Differential     Standing Status:   Future    Standing Expiration Date:   09/05/2018  . Comprehensive metabolic panel    Standing Status:   Future    Standing Expiration Date:   09/05/2018  . CEA    Standing Status:   Future    Standing Expiration Date:   09/05/2018   All questions were answered. The patient knows to call the clinic with any problems, questions or concerns.      Cammie Sickle, MD 09/08/2017 6:08 PM

## 2017-09-04 NOTE — Assessment & Plan Note (Addendum)
#   RECURRENT RECTAL CANCER STAGE IV: CT scan April 2019 significant response to therapy.   # Continue with FOLFIRI; CBC CMP are reviewed; adequate today; no contraindications to chemotherapy. Proceed with treatment today.    #Right upper quadrant abdominal pain-question etiology.  Recommend ultrasound of the abdomen.   #Chronic kidney disease bilateral hydronephrosis/; bilateral nephrostomy tubes [exchanged   #  CKD- secondary to bilateral hydronephrosis from the underlying pelvic mass. S/p PNC-recent change of tubes [April 2019]  Bil.creatinine at 1.4-STABLE.   # Back pain- secondary to pelvic mass/PCN; continue hydrocodone as needed. STABLE.   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   # DM-2 ; on insulin/ amaryl; On Lantus. Better controlled.   # follow up 3 weeks/labs/;MD/  [place] ASAP;

## 2017-09-04 NOTE — Progress Notes (Signed)
C/o "right lower quadrant pain x 1 week. Often escalates up to a 9." relieved with narcotics. Last dose of norco taking at 6 am. Currently Rates pain today at 2/10. patient reports intermittent episodes of nausea/vomiting.  Denies any episodes of diarrhea or constipation.

## 2017-09-05 ENCOUNTER — Ambulatory Visit
Admission: RE | Admit: 2017-09-05 | Discharge: 2017-09-05 | Disposition: A | Discharge status: Home / Self Care | Payer: Medicare Other | Source: Ambulatory Visit | Attending: Internal Medicine | Admitting: Internal Medicine

## 2017-09-05 DIAGNOSIS — K838 Other specified diseases of biliary tract: Secondary | ICD-10-CM | POA: Diagnosis not present

## 2017-09-05 DIAGNOSIS — K824 Cholesterolosis of gallbladder: Secondary | ICD-10-CM | POA: Diagnosis not present

## 2017-09-05 DIAGNOSIS — Z936 Other artificial openings of urinary tract status: Secondary | ICD-10-CM | POA: Diagnosis not present

## 2017-09-05 DIAGNOSIS — G893 Neoplasm related pain (acute) (chronic): Secondary | ICD-10-CM | POA: Diagnosis not present

## 2017-09-05 DIAGNOSIS — C20 Malignant neoplasm of rectum: Secondary | ICD-10-CM | POA: Insufficient documentation

## 2017-09-05 DIAGNOSIS — R1011 Right upper quadrant pain: Secondary | ICD-10-CM | POA: Insufficient documentation

## 2017-09-06 ENCOUNTER — Inpatient Hospital Stay: Payer: Medicare Other

## 2017-09-06 VITALS — BP 113/70 | HR 80 | Temp 96.8°F | Resp 18

## 2017-09-06 DIAGNOSIS — R530 Neoplastic (malignant) related fatigue: Secondary | ICD-10-CM | POA: Diagnosis not present

## 2017-09-06 DIAGNOSIS — E118 Type 2 diabetes mellitus with unspecified complications: Secondary | ICD-10-CM | POA: Diagnosis not present

## 2017-09-06 DIAGNOSIS — R1011 Right upper quadrant pain: Secondary | ICD-10-CM | POA: Diagnosis not present

## 2017-09-06 DIAGNOSIS — M7989 Other specified soft tissue disorders: Secondary | ICD-10-CM | POA: Diagnosis not present

## 2017-09-06 DIAGNOSIS — C20 Malignant neoplasm of rectum: Secondary | ICD-10-CM | POA: Diagnosis not present

## 2017-09-06 DIAGNOSIS — E7800 Pure hypercholesterolemia, unspecified: Secondary | ICD-10-CM | POA: Diagnosis not present

## 2017-09-06 DIAGNOSIS — Z452 Encounter for adjustment and management of vascular access device: Secondary | ICD-10-CM | POA: Diagnosis not present

## 2017-09-06 DIAGNOSIS — G893 Neoplasm related pain (acute) (chronic): Secondary | ICD-10-CM | POA: Diagnosis not present

## 2017-09-06 DIAGNOSIS — Z794 Long term (current) use of insulin: Secondary | ICD-10-CM | POA: Diagnosis not present

## 2017-09-06 DIAGNOSIS — I481 Persistent atrial fibrillation: Secondary | ICD-10-CM | POA: Diagnosis not present

## 2017-09-06 DIAGNOSIS — I10 Essential (primary) hypertension: Secondary | ICD-10-CM | POA: Diagnosis not present

## 2017-09-06 MED ORDER — HEPARIN SOD (PORK) LOCK FLUSH 100 UNIT/ML IV SOLN
500.0000 [IU] | Freq: Once | INTRAVENOUS | Status: AC | PRN
  Administered 2017-09-06: 500 [IU]
  Filled 2017-09-06: qty 5

## 2017-09-09 ENCOUNTER — Encounter: Payer: Self-pay | Admitting: Urology

## 2017-09-10 ENCOUNTER — Other Ambulatory Visit: Payer: Self-pay | Admitting: Cardiology

## 2017-09-10 DIAGNOSIS — Z794 Long term (current) use of insulin: Secondary | ICD-10-CM | POA: Diagnosis not present

## 2017-09-10 DIAGNOSIS — I2510 Atherosclerotic heart disease of native coronary artery without angina pectoris: Secondary | ICD-10-CM | POA: Diagnosis not present

## 2017-09-10 DIAGNOSIS — I871 Compression of vein: Secondary | ICD-10-CM | POA: Diagnosis not present

## 2017-09-10 DIAGNOSIS — R0602 Shortness of breath: Secondary | ICD-10-CM | POA: Diagnosis not present

## 2017-09-10 DIAGNOSIS — C20 Malignant neoplasm of rectum: Secondary | ICD-10-CM | POA: Diagnosis not present

## 2017-09-10 DIAGNOSIS — E119 Type 2 diabetes mellitus without complications: Secondary | ICD-10-CM

## 2017-09-10 DIAGNOSIS — I82422 Acute embolism and thrombosis of left iliac vein: Secondary | ICD-10-CM | POA: Diagnosis not present

## 2017-09-10 DIAGNOSIS — E118 Type 2 diabetes mellitus with unspecified complications: Secondary | ICD-10-CM | POA: Diagnosis not present

## 2017-09-10 DIAGNOSIS — I481 Persistent atrial fibrillation: Secondary | ICD-10-CM | POA: Diagnosis not present

## 2017-09-10 DIAGNOSIS — G4733 Obstructive sleep apnea (adult) (pediatric): Secondary | ICD-10-CM | POA: Diagnosis not present

## 2017-09-10 DIAGNOSIS — I10 Essential (primary) hypertension: Secondary | ICD-10-CM | POA: Diagnosis not present

## 2017-09-10 DIAGNOSIS — E7800 Pure hypercholesterolemia, unspecified: Secondary | ICD-10-CM | POA: Diagnosis not present

## 2017-09-11 DIAGNOSIS — E1159 Type 2 diabetes mellitus with other circulatory complications: Secondary | ICD-10-CM | POA: Diagnosis not present

## 2017-09-11 DIAGNOSIS — E1142 Type 2 diabetes mellitus with diabetic polyneuropathy: Secondary | ICD-10-CM | POA: Diagnosis not present

## 2017-09-11 DIAGNOSIS — E785 Hyperlipidemia, unspecified: Secondary | ICD-10-CM | POA: Diagnosis not present

## 2017-09-11 DIAGNOSIS — Z794 Long term (current) use of insulin: Secondary | ICD-10-CM | POA: Diagnosis not present

## 2017-09-11 DIAGNOSIS — I10 Essential (primary) hypertension: Secondary | ICD-10-CM | POA: Diagnosis not present

## 2017-09-11 DIAGNOSIS — E1169 Type 2 diabetes mellitus with other specified complication: Secondary | ICD-10-CM | POA: Diagnosis not present

## 2017-09-12 ENCOUNTER — Encounter: Payer: Self-pay | Admitting: Urology

## 2017-09-12 ENCOUNTER — Ambulatory Visit (INDEPENDENT_AMBULATORY_CARE_PROVIDER_SITE_OTHER): Payer: Medicare Other | Admitting: Urology

## 2017-09-12 VITALS — BP 132/79 | HR 63 | Ht 70.0 in | Wt 247.0 lb

## 2017-09-12 DIAGNOSIS — N1330 Unspecified hydronephrosis: Secondary | ICD-10-CM

## 2017-09-12 DIAGNOSIS — Z01818 Encounter for other preprocedural examination: Secondary | ICD-10-CM | POA: Diagnosis not present

## 2017-09-12 LAB — URINALYSIS, COMPLETE
BILIRUBIN UA: NEGATIVE
KETONES UA: NEGATIVE
Nitrite, UA: NEGATIVE
Specific Gravity, UA: 1.015 (ref 1.005–1.030)
UUROB: 0.2 mg/dL (ref 0.2–1.0)
pH, UA: 6.5 (ref 5.0–7.5)

## 2017-09-12 LAB — MICROSCOPIC EXAMINATION: Epithelial Cells (non renal): NONE SEEN /hpf (ref 0–10)

## 2017-09-12 MED ORDER — CIPROFLOXACIN HCL 250 MG PO TABS
250.0000 mg | ORAL_TABLET | Freq: Two times a day (BID) | ORAL | 0 refills | Status: DC
Start: 2017-09-12 — End: 2017-09-25

## 2017-09-12 NOTE — Progress Notes (Signed)
09/12/2017 4:20 PM   Donald Townsend 03/05/1951 [id number]  Referring provider: Dion Body, MD [facility] [place], [place] [address]  Chief Complaint  Patient presents with  . 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  At that time, he was noted to have significant distortion of the normal bladder architecture related to presumed infiltration of colon cancer invading into the bladder wall.   Several episodes of urosepsis since nephrostomy tube placement.    He is under the care of Dr. Rogue Bussing, currently undergoing treatment with [facility].  With treatment, his CEA is falling.  He seems to be tolerating chemotherapy well.  Most recent CT abd/ pelvis 08/13/2017 with fairly robust response.  He is now on maintenance dose every 3 weeks of chemotherapy.   Nephrostomy tubes last exchanged on 07/26/2017.  He was treated the day before, the day after the day of nephrostomy tube exchange with Cipro without any septic events.  He continues to have a decrease amount of output from the left compared to the right which is been the case since they were placed.  He reports a more malodorous smell from the left compared to the right.  He did have some issues with encrustation/clogging of the right tube was ultimately able to flush this out is now draining fine.   PMH: Past Medical History:  Diagnosis Date  . A-fib ([place])   . Cancer [facility])    Colon  . CHF (congestive heart failure) ([place])   . Diabetes mellitus without complication ([place])   . Dyspnea   . Elevated lipids   . Hypertension   . PVD (peripheral vascular disease) [facility])     Surgical History: Past Surgical History:  Procedure Laterality Date  . COLON RESECTION     with  colostomy  . COLONOSCOPY    . CYSTOSCOPY WITH STENT PLACEMENT Bilateral 01/18/2017   Procedure: CYSTOSCOPY WITH STENT PLACEMENT;  Surgeon: [name], MD;  Location: [facility] ORS;  Service: Urology;  Laterality: Bilateral;  . IR FLUORO GUIDE PORT INSERTION RIGHT  01/24/2017  . IR NEPHROSTOMY EXCHANGE LEFT  03/14/2017  . IR NEPHROSTOMY EXCHANGE LEFT  05/03/2017  . IR NEPHROSTOMY EXCHANGE LEFT  06/14/2017  . IR NEPHROSTOMY EXCHANGE LEFT  07/26/2017  . IR NEPHROSTOMY EXCHANGE RIGHT  03/14/2017  . IR NEPHROSTOMY EXCHANGE RIGHT  05/03/2017  . IR NEPHROSTOMY EXCHANGE RIGHT  06/14/2017  . IR NEPHROSTOMY EXCHANGE RIGHT  07/26/2017  . IR NEPHROSTOMY PLACEMENT LEFT  01/19/2017  . IR NEPHROSTOMY PLACEMENT RIGHT  01/19/2017  . IVC FILTER REMOVAL N/A 08/28/2016   Procedure: IVC Filter Removal;  Surgeon: Katha Cabal, MD;  Location: [place] [facility] LAB;  Service: Cardiovascular;  Laterality: N/A;  . LOWER EXTREMITY VENOGRAPHY Left 12/18/2016   Procedure: Lower Extremity Venography;  Surgeon: Katha Cabal, MD;  Location: [place] [facility];  Service: Cardiovascular;  Laterality: Left;  . PERIPHERAL VASCULAR CATHETERIZATION Left 05/08/2016   Procedure: Lower Extremity Venography;  Surgeon: Katha Cabal, MD;  Location: [place] [facility];  Service: Cardiovascular;  Laterality: Left;    Home Medications:  Allergies as of 09/12/2017   No Known Allergies     Medication List        Accurate as of 09/12/17  4:20 PM. Always use your most  recent med list.          acidophilus Caps capsule Take 1 capsule daily by mouth.   B-D ULTRAFINE III SHORT PEN 31G X 8 MM Misc Generic drug:  Insulin Pen Needle   ciprofloxacin 250 MG tablet Commonly known as:  CIPRO Take 1 tablet (250 mg total) by mouth 2 (two) times daily. Start day before, day of and day after meds   digoxin 0.25 MG tablet Commonly known as:  LANOXIN Take 0.25 mg by mouth daily.   ELIQUIS 5 MG Tabs tablet Generic drug:   apixaban Take 5 mg by mouth every 12 (twelve) hours.   feeding supplement (GLUCERNA SHAKE) Liqd Follow manufacturer instructions   FLOMAX 0.4 MG Caps capsule Generic drug:  tamsulosin Take by mouth.   FREESTYLE LIBRE READER Devi Use 1 Units as directed. Check CBG's four times daily. Dx: E11.9   FREESTYLE LIBRE SENSOR SYSTEM Misc Use 1 Units as directed. Check CBG's four times daily. Dx: E11.9   furosemide 40 MG tablet Commonly known as:  LASIX Take 40 mg by mouth daily.   glimepiride 4 MG tablet Commonly known as:  AMARYL Take 1 tablet by mouth daily.   HYDROcodone-acetaminophen 5-325 MG tablet Commonly known as:  NORCO/VICODIN Take 1 tablet by mouth every 8 (eight) hours as needed for moderate pain.   insulin aspart 100 UNIT/ML FlexPen Commonly known as:  NOVOLOG Inject 7 Units into the skin 3 (three) times daily with meals.   Lancets 30G Misc Use 1 Units as directed. Check CBG's up to twice daily. Dx: E11.9   LANTUS SOLOSTAR 100 UNIT/ML Solostar Pen Generic drug:  Insulin Glargine Inject 20 Units into the skin at bedtime.   losartan 100 MG tablet Commonly known as:  COZAAR Take 100 mg by mouth daily.   metoprolol tartrate 25 MG tablet Commonly known as:  LOPRESSOR Take 25 mg by mouth 2 (two) times daily.   ondansetron 8 MG tablet Commonly known as:  ZOFRAN 1 pill every 8 hours for nausea/vomitting as needed   pravastatin 20 MG tablet Commonly known as:  PRAVACHOL take 20mg  by mouth daily at bedtime   prochlorperazine 10 MG tablet Commonly known as:  COMPAZINE Take 1 tablet (10 mg total) by mouth every 6 (six) hours as needed for nausea or vomiting.       Allergies: No Known Allergies  Family History: Family History  Problem Relation Age of Onset  . Diabetes Mother   . Stroke Mother   . Prostate cancer Brother   . Cancer Sister        Breast cancer  . Kidney cancer Neg Hx   . Bladder Cancer Neg Hx     Social History:  reports that he has  never smoked. He has never used smokeless tobacco. He reports that he does not drink alcohol or use drugs.  ROS: UROLOGY Frequent Urination?: No Hard to postpone urination?: No Burning/pain with urination?: No Get up at night to urinate?: Yes Leakage of urine?: No Urine stream starts and stops?: No Trouble starting stream?: No Do you have to strain to urinate?: No Blood in urine?: Yes Urinary tract infection?: No Sexually transmitted disease?: No Injury to kidneys or bladder?: No Painful intercourse?: No Weak stream?: No Erection problems?: No Penile pain?: No  Gastrointestinal Nausea?: No Vomiting?: No Indigestion/heartburn?: No Diarrhea?: No Constipation?: No  Constitutional Fever: No Night sweats?: No Weight loss?: No Fatigue?: No  Skin Skin rash/lesions?: No Itching?: No  Eyes Blurred vision?:  No Double vision?: No  Ears/Nose/Throat Sore throat?: No Sinus problems?: No  Hematologic/Lymphatic Swollen glands?: No Easy bruising?: No  Cardiovascular Leg swelling?: No Chest pain?: No  Respiratory Cough?: No Shortness of breath?: No  Endocrine Excessive thirst?: No  Musculoskeletal Back pain?: No Joint pain?: No  Neurological Headaches?: No Dizziness?: No  Psychologic Depression?: No Anxiety?: No  Physical Exam: BP 132/79   Pulse 63   Ht 5\' 10"  (1.778 m)   Wt 247 lb (112 kg)   BMI 35.44 kg/m   Constitutional:  Alert and oriented, No acute distress.  Appears to be more robust and well-appearing today. HEENT: [place] AT, moist mucus membranes.  Trachea midline, no masses. Cardiovascular: No clubbing, cyanosis, or edema. Respiratory: Normal respiratory effort, no increased work of breathing. GI: Abdomen is soft, nontender, nondistended, no abdominal masses GU: Bilateral nephrostomy tubes in place draining clear yellow urine, scant output on left as compared to right.  Dressings clean dry and intact. Skin: No rashes, bruises or suspicious  lesions. Neurologic: Grossly intact, no focal deficits, moving all 4 extremities. Psychiatric: Normal mood and affect.  Energetic.  Laboratory Data: Lab Results  Component Value Date   WBC 7.9 09/04/2017   HGB 13.1 09/04/2017   HCT 36.9 (L) 09/04/2017   MCV 85.9 09/04/2017   PLT 251 09/04/2017    Lab Results  Component Value Date   CREATININE 1.52 (H) 09/04/2017    Lab Results  Component Value Date   HGBA1C 7.5 (H) 01/19/2017    Urinalysis Results for orders placed or performed in visit on 09/12/17  Microscopic Examination  Result Value Ref Range   WBC, UA 11-30 (A) 0 - 5 /hpf   RBC, UA 11-30 (A) 0 - 2 /hpf   Epithelial Cells (non renal) None seen 0 - 10 /hpf   Mucus, UA Present (A) Not Estab.   Bacteria, UA Moderate (A) None seen/Few  Urinalysis, Complete  Result Value Ref Range   Specific Gravity, UA 1.015 1.005 - 1.030   pH, UA 6.5 5.0 - 7.5   Color, UA Yellow Yellow   Appearance Ur Cloudy (A) Clear   Leukocytes, UA 2+ (A) Negative   Protein, UA 2+ (A) Negative/Trace   Glucose, UA Trace (A) Negative   Ketones, UA Negative Negative   RBC, UA 3+ (A) Negative   Bilirubin, UA Negative Negative   Urobilinogen, Ur 0.2 0.2 - 1.0 mg/dL   Nitrite, UA Negative Negative   Microscopic Examination See below:     Pertinent Imaging: CT abdomen pelvis without contrast from 08/13/2017 reviewed, there is been significant regression of his pelvic mass.  Nephrostomy tubes appear to be in good position.  Assessment & Plan:    1. Hydronephrosis, unspecified hydronephrosis type 2 for nephrostomy tube exchange Given his radiographic response to chemotherapy, I recommended that an antegrade nephrostogram be performed at the time of tube exchange to assess whether or not the ureters are patent.  May consider clamping trial versus tube removal based on these results. We discussed that if we start using his bladder, he may have issues with storage, urgency, frequency and possibly  incontinence.  We also discussed the option of antegrade ureteral stent placement.  Urine culture sent today.  Cipro sent to pharmacy for periprocedural use.  Patient question today whether he should continue Flomax, given that we may ultimately remove his nephrostomy tube, its a reasonable idea to continue this medication.  - Urinalysis, Complete - CULTURE, URINE COMPREHENSIVE   Hollice Espy, MD  [place] [address] [place], [place] [address] [phone]

## 2017-09-13 ENCOUNTER — Ambulatory Visit (HOSPITAL_COMMUNITY)
Admission: RE | Admit: 2017-09-13 | Discharge: 2017-09-13 | Disposition: A | Discharge status: Home / Self Care | Payer: Medicare Other | Source: Ambulatory Visit | Attending: Cardiology | Admitting: Cardiology

## 2017-09-13 ENCOUNTER — Telehealth: Payer: Self-pay | Admitting: Radiology

## 2017-09-13 DIAGNOSIS — R079 Chest pain, unspecified: Secondary | ICD-10-CM | POA: Diagnosis not present

## 2017-09-13 DIAGNOSIS — Z794 Long term (current) use of insulin: Secondary | ICD-10-CM | POA: Insufficient documentation

## 2017-09-13 DIAGNOSIS — I2510 Atherosclerotic heart disease of native coronary artery without angina pectoris: Secondary | ICD-10-CM | POA: Insufficient documentation

## 2017-09-13 DIAGNOSIS — E119 Type 2 diabetes mellitus without complications: Secondary | ICD-10-CM | POA: Diagnosis not present

## 2017-09-13 DIAGNOSIS — I700 Atherosclerosis of aorta: Secondary | ICD-10-CM | POA: Insufficient documentation

## 2017-09-13 MED ORDER — IOPAMIDOL (ISOVUE-370) INJECTION 76%
INTRAVENOUS | Status: AC
  Administered 2017-09-13: 100 mL
  Filled 2017-09-13: qty 100

## 2017-09-13 NOTE — Telephone Encounter (Signed)
Spoke with Pamala Hurry in [employer] scheduling. Bilateral nephrostograms have been added at the time of his tube changes scheduled 09/18/2017.

## 2017-09-13 NOTE — Telephone Encounter (Signed)
-----   Message from Hollice Espy, MD sent at 09/12/2017  4:25 PM EDT ----- [name], I added orders for antegrade nephrostograms at the time of his tube exchange next week.  I would like to see if we can try a clamp trial versus tube removal.  If he can communicate this with IR, that would be very helpful.

## 2017-09-15 LAB — CULTURE, URINE COMPREHENSIVE

## 2017-09-16 ENCOUNTER — Other Ambulatory Visit: Payer: Self-pay | Admitting: Urology

## 2017-09-16 ENCOUNTER — Other Ambulatory Visit: Payer: Self-pay | Admitting: Radiology

## 2017-09-16 DIAGNOSIS — N1330 Unspecified hydronephrosis: Secondary | ICD-10-CM

## 2017-09-18 ENCOUNTER — Ambulatory Visit
Admission: RE | Admit: 2017-09-18 | Discharge: 2017-09-18 | Disposition: A | Discharge status: Home / Self Care | Payer: Medicare Other | Source: Ambulatory Visit | Attending: Urology | Admitting: Urology

## 2017-09-18 ENCOUNTER — Other Ambulatory Visit: Payer: Self-pay | Admitting: Urology

## 2017-09-18 ENCOUNTER — Other Ambulatory Visit: Payer: Self-pay | Admitting: Radiology

## 2017-09-18 ENCOUNTER — Encounter: Payer: Self-pay | Admitting: Interventional Radiology

## 2017-09-18 DIAGNOSIS — N1330 Unspecified hydronephrosis: Secondary | ICD-10-CM | POA: Diagnosis not present

## 2017-09-18 DIAGNOSIS — Z936 Other artificial openings of urinary tract status: Secondary | ICD-10-CM | POA: Diagnosis not present

## 2017-09-18 DIAGNOSIS — Z85048 Personal history of other malignant neoplasm of rectum, rectosigmoid junction, and anus: Secondary | ICD-10-CM | POA: Diagnosis not present

## 2017-09-18 DIAGNOSIS — N135 Crossing vessel and stricture of ureter without hydronephrosis: Secondary | ICD-10-CM

## 2017-09-18 DIAGNOSIS — Z436 Encounter for attention to other artificial openings of urinary tract: Secondary | ICD-10-CM | POA: Diagnosis not present

## 2017-09-18 HISTORY — PX: IR NEPHROSTOMY EXCHANGE LEFT: IMG6069

## 2017-09-18 HISTORY — PX: IR NEPHROSTOGRAM LEFT THRU EXISTING ACCESS: IMG6061

## 2017-09-18 HISTORY — PX: IR NEPHROSTOGRAM RIGHT THRU EXISTING ACCESS: IMG6062

## 2017-09-18 MED ORDER — LIDOCAINE HCL (PF) 1 % IJ SOLN
INTRAMUSCULAR | Status: AC
  Filled 2017-09-18: qty 30

## 2017-09-18 MED ORDER — IOHEXOL 300 MG/ML  SOLN: 40.0000 mL | Freq: Once | INTRAMUSCULAR | Status: DC | PRN

## 2017-09-18 NOTE — Procedures (Signed)
Pre Procedure Dx: Hydronephrosis Post Procedure Dx: Same  Successful fluoroscopic guided exchange of left sided percutaneous nephrostomy catheter secondary to persistent occlusion of the distal aspect the left ureter at the level of the pelvic brim.  Right-sided antegrade nephrostogram demonstrates passage of contrast through the right ureter to the level of the urinary bladder.   EBL: None No immediate complications.   PLAN:  - The patient was given an extra gravity bag and instructed to reconnect the right-sided nephrostomy catheter to the gravity bag if he were to develop recurrent right-sided urinary obstructive symptoms.   - Otherwise, the patient will return to the interventional radiology department early next week.    - If he passes the trial of nephrostomy internalization with repeat antegrade nephrostogram demonstrating patency of the right ureter, the right-sided nephrostomy catheter may be removed.    - If the patient fails his trial of internalization, fluoroscopic guided exchange will be performed at that time.   Patient demonstrated excellent understanding of the above discussion and plan of care which was agreed upon with referring urologist, Dr. Erlene Quan, at the time of procedure completion.   Ronny Bacon, MD Pager #: [phone]

## 2017-09-18 NOTE — Discharge Instructions (Signed)
Percutaneous Nephrostomy, Care After  This sheet gives you information about how to care for yourself after your procedure. Your health care provider may also give you more specific instructions. If you have problems or questions, contact your health care provider.  What can I expect after the procedure?  After the procedure, it is common to have:  · Some soreness where the nephrostomy tube was inserted (tube insertion site).  · Blood-tinged drainage from the nephrostomy tube for the first 24 hours.    Follow these instructions at home:  Activity  · Return to your normal activities as told by your health care provider. Ask your health care provider what activities are safe for you.  · Avoid activities that may cause the nephrostomy tubing to bend.  · Do not take baths, swim, or use a hot tub until your health care provider approves. Ask your health care provider if you can take showers. Cover the nephrostomy tube dressing with a watertight covering when you take a shower.  · Do not drive for 24 hours if you were given a medicine to help you relax (sedative).  Care of the tube insertion site  · Follow instructions from your health care provider about how to take care of your tube insertion site. Make sure you:  ? Wash your hands with soap and water before you change your bandage (dressing). If soap and water are not available, use hand sanitizer.  ? Change your dressing as told by your health care provider. Be careful not to pull on the tube while removing the dressing.  ? When you change the dressing, wash the skin around the tube, rinse well, and pat the skin dry.  · Check the tube insertion area every day for signs of infection. Check for:  ? More redness, swelling, or pain.  ? More fluid or blood.  ? Warmth.  ? Pus or a bad smell.  Care of the nephrostomy tube and drainage bag  · Always keep the tubing, the leg bag, or the bedside drainage bags below the level of the kidney so that your urine drains  freely.  · When connecting your nephrostomy tube to a drainage bag, make sure that there are no kinks in the tubing and that your urine is draining freely. You may want to use an elastic bandage to wrap any exposed tubing that goes from the nephrostomy tube to any of the connecting tubes.  · At night, you may want to connect your nephrostomy tube or the leg bag to a larger bedside drainage bag.  · Follow instructions from your health care provider about how to empty or change the drainage bag.  · Empty the drainage bag when it becomes ? full.  · Replace the drainage bag and any extension tubing that is connected to your nephrostomy tube every 3 weeks or as often as told by your health care provider. Your health care provider will explain how to change the drainage bag and extension tubing.  General instructions  · Take over-the-counter and prescription medicines only as told by your health care provider.  · Keep all follow-up visits as told by your health care provider. This is important.  Contact a health care provider if:  · You have problems with any of the valves or tubing.  · You have persistent pain or soreness in your back.  · You have more redness, swelling, or pain around your tube insertion site.  · You have more fluid or blood coming from   your tube insertion site.  · Your tube insertion site feels warm to the touch.  · You have pus or a bad smell coming from your tube insertion site.  · You have increased urine output or you feel burning when urinating.  Get help right away if:  · You have pain in your abdomen during the first week.  · You have chest pain or have trouble breathing.  · You have a new appearance of blood in your urine.  · You have a fever or chills.  · You have back pain that is not relieved by your medicine.  · You have decreased urine output.  · Your nephrostomy tube comes out.  This information is not intended to replace advice given to you by your health care provider. Make sure you  discuss any questions you have with your health care provider.  Document Released: 12/08/2003 Document Revised: 01/27/2016 Document Reviewed: 01/27/2016  Elsevier Interactive Patient Education © 2018 [employer]

## 2017-09-18 NOTE — Progress Notes (Signed)
Patient clinically stable post procedure per DR Pascal Lux, denies complaints. Discharge instructions given with questions answered.

## 2017-09-24 ENCOUNTER — Other Ambulatory Visit: Payer: Self-pay | Admitting: Radiology

## 2017-09-24 ENCOUNTER — Ambulatory Visit
Admission: RE | Admit: 2017-09-24 | Discharge: 2017-09-24 | Disposition: A | Discharge status: Home / Self Care | Payer: Medicare Other | Source: Ambulatory Visit | Attending: Urology | Admitting: Urology

## 2017-09-24 ENCOUNTER — Telehealth: Payer: Self-pay | Admitting: Interventional Radiology

## 2017-09-24 DIAGNOSIS — N135 Crossing vessel and stricture of ureter without hydronephrosis: Secondary | ICD-10-CM

## 2017-09-24 DIAGNOSIS — N1330 Unspecified hydronephrosis: Secondary | ICD-10-CM

## 2017-09-24 DIAGNOSIS — Z7901 Long term (current) use of anticoagulants: Secondary | ICD-10-CM | POA: Insufficient documentation

## 2017-09-24 DIAGNOSIS — Z79899 Other long term (current) drug therapy: Secondary | ICD-10-CM | POA: Insufficient documentation

## 2017-09-24 DIAGNOSIS — Z794 Long term (current) use of insulin: Secondary | ICD-10-CM | POA: Insufficient documentation

## 2017-09-24 LAB — GLUCOSE, CAPILLARY: Glucose-Capillary: 264 mg/dL — ABNORMAL HIGH (ref 65–99)

## 2017-09-24 NOTE — Progress Notes (Signed)
Patient presents today for potential right-sided antegrade nephrostogram and nephrostomy catheter removal after having successfully tolerated his trial of nephrostomy catheter capping since the end of last week.   While the patient has tolerated nephrostomy catheter well without evidence of recurrent urinary obstructive symptoms, he remains concerned given his urinary frequency and at this point wishes to postpone the nephrostogram and nephrostomy catheter removal until he is certain his bladder expands to a more acceptable volume and function.  As such, patient will return the end of next week for potential antegrade nephrostogram and removal.    If patient does not wish to pursue nephrostomy catheter removal, would perform fluoroscopic guided exchange at that time.   Ronny Bacon, MD Pager #: [phone]

## 2017-09-25 ENCOUNTER — Inpatient Hospital Stay: Payer: Medicare Other

## 2017-09-25 ENCOUNTER — Inpatient Hospital Stay (HOSPITAL_BASED_OUTPATIENT_CLINIC_OR_DEPARTMENT_OTHER): Payer: Medicare Other | Admitting: Internal Medicine

## 2017-09-25 VITALS — BP 119/70 | HR 87 | Temp 97.0°F | Resp 18 | Ht 70.0 in | Wt 249.0 lb

## 2017-09-25 DIAGNOSIS — Z794 Long term (current) use of insulin: Secondary | ICD-10-CM | POA: Diagnosis not present

## 2017-09-25 DIAGNOSIS — Z79899 Other long term (current) drug therapy: Secondary | ICD-10-CM | POA: Diagnosis not present

## 2017-09-25 DIAGNOSIS — Z803 Family history of malignant neoplasm of breast: Secondary | ICD-10-CM | POA: Diagnosis not present

## 2017-09-25 DIAGNOSIS — C20 Malignant neoplasm of rectum: Secondary | ICD-10-CM | POA: Diagnosis not present

## 2017-09-25 DIAGNOSIS — G893 Neoplasm related pain (acute) (chronic): Secondary | ICD-10-CM

## 2017-09-25 DIAGNOSIS — Z452 Encounter for adjustment and management of vascular access device: Secondary | ICD-10-CM | POA: Diagnosis not present

## 2017-09-25 DIAGNOSIS — R1011 Right upper quadrant pain: Secondary | ICD-10-CM | POA: Diagnosis not present

## 2017-09-25 DIAGNOSIS — M7989 Other specified soft tissue disorders: Secondary | ICD-10-CM | POA: Diagnosis not present

## 2017-09-25 DIAGNOSIS — N189 Chronic kidney disease, unspecified: Secondary | ICD-10-CM

## 2017-09-25 DIAGNOSIS — Z8042 Family history of malignant neoplasm of prostate: Secondary | ICD-10-CM

## 2017-09-25 DIAGNOSIS — E119 Type 2 diabetes mellitus without complications: Secondary | ICD-10-CM | POA: Diagnosis not present

## 2017-09-25 DIAGNOSIS — R530 Neoplastic (malignant) related fatigue: Secondary | ICD-10-CM | POA: Diagnosis not present

## 2017-09-25 DIAGNOSIS — Z7901 Long term (current) use of anticoagulants: Secondary | ICD-10-CM

## 2017-09-25 DIAGNOSIS — Z86718 Personal history of other venous thrombosis and embolism: Secondary | ICD-10-CM

## 2017-09-25 LAB — CBC WITH DIFFERENTIAL/PLATELET
BASOS ABS: 0.1 10*3/uL (ref 0–0.1)
BASOS PCT: 1 %
Eosinophils Absolute: 0.3 10*3/uL (ref 0–0.7)
Eosinophils Relative: 4 %
HEMATOCRIT: 36.8 % — AB (ref 40.0–52.0)
Hemoglobin: 12.9 g/dL — ABNORMAL LOW (ref 13.0–18.0)
Lymphocytes Relative: 14 %
Lymphs Abs: 1.1 10*3/uL (ref 1.0–3.6)
MCH: 30.7 pg (ref 26.0–34.0)
MCHC: 35.2 g/dL (ref 32.0–36.0)
MCV: 87.2 fL (ref 80.0–100.0)
MONO ABS: 0.9 10*3/uL (ref 0.2–1.0)
MONOS PCT: 11 %
NEUTROS ABS: 5.5 10*3/uL (ref 1.4–6.5)
Neutrophils Relative %: 70 %
PLATELETS: 242 10*3/uL (ref 150–440)
RBC: 4.22 MIL/uL — ABNORMAL LOW (ref 4.40–5.90)
RDW: 16 % — AB (ref 11.5–14.5)
WBC: 7.9 10*3/uL (ref 3.8–10.6)

## 2017-09-25 LAB — COMPREHENSIVE METABOLIC PANEL
ALBUMIN: 3.6 g/dL (ref 3.5–5.0)
ALK PHOS: 67 U/L (ref 38–126)
ALT: 15 U/L — AB (ref 17–63)
ANION GAP: 8 (ref 5–15)
AST: 17 U/L (ref 15–41)
BILIRUBIN TOTAL: 0.5 mg/dL (ref 0.3–1.2)
BUN: 28 mg/dL — ABNORMAL HIGH (ref 6–20)
CALCIUM: 9.9 mg/dL (ref 8.9–10.3)
CO2: 25 mmol/L (ref 22–32)
CREATININE: 1.54 mg/dL — AB (ref 0.61–1.24)
Chloride: 104 mmol/L (ref 101–111)
GFR calc Af Amer: 53 mL/min — ABNORMAL LOW (ref >60)
GFR calc non Af Amer: 45 mL/min — ABNORMAL LOW (ref >60)
GLUCOSE: 209 mg/dL — AB (ref 65–99)
Potassium: 4 mmol/L (ref 3.5–5.1)
SODIUM: 137 mmol/L (ref 135–145)
TOTAL PROTEIN: 7.4 g/dL (ref 6.5–8.1)

## 2017-09-25 MED ORDER — DEXTROSE 5 % IV SOLN
150.0000 mg/m2 | Freq: Once | INTRAVENOUS | Status: AC
  Administered 2017-09-25: 360 mg via INTRAVENOUS
  Filled 2017-09-25: qty 15

## 2017-09-25 MED ORDER — FLUOROURACIL CHEMO INJECTION 5 GM/100ML
5000.0000 mg | INTRAVENOUS | Status: DC
  Administered 2017-09-25: 5000 mg via INTRAVENOUS
  Filled 2017-09-25: qty 100

## 2017-09-25 MED ORDER — HEPARIN SOD (PORK) LOCK FLUSH 100 UNIT/ML IV SOLN: 500.0000 [IU] | Freq: Once | INTRAVENOUS | Status: DC

## 2017-09-25 MED ORDER — PALONOSETRON HCL INJECTION 0.25 MG/5ML
0.2500 mg | Freq: Once | INTRAVENOUS | Status: AC
  Administered 2017-09-25: 0.25 mg via INTRAVENOUS
  Filled 2017-09-25: qty 5

## 2017-09-25 MED ORDER — SODIUM CHLORIDE 0.9 % IV SOLN
Freq: Once | INTRAVENOUS | Status: AC
  Administered 2017-09-25: 11:00:00 via INTRAVENOUS
  Filled 2017-09-25: qty 1000

## 2017-09-25 MED ORDER — LEUCOVORIN CALCIUM INJECTION 350 MG
1000.0000 mg | Freq: Once | INTRAVENOUS | Status: AC
  Administered 2017-09-25: 1000 mg via INTRAVENOUS
  Filled 2017-09-25: qty 50

## 2017-09-25 MED ORDER — ATROPINE SULFATE 1 MG/ML IJ SOLN
0.5000 mg | Freq: Once | INTRAMUSCULAR | Status: AC | PRN
  Administered 2017-09-25: 0.5 mg via INTRAVENOUS
  Filled 2017-09-25: qty 1

## 2017-09-25 MED ORDER — SODIUM CHLORIDE 0.9% FLUSH
10.0000 mL | INTRAVENOUS | Status: DC | PRN
  Administered 2017-09-25: 10 mL via INTRAVENOUS
  Filled 2017-09-25: qty 10

## 2017-09-25 NOTE — Progress Notes (Signed)
[facility] OFFICE PROGRESS NOTE  Patient Care Team: Dion Body, MD as PCP - General (Family Medicine) Clent Jacks, RN as Registered Nurse  Cancer Staging No matching staging information was found for the patient.   Oncology History   # 2012- RECTO-SIGMOID-poorly diff STAGE III CA [s/p neo-adj chemo-RT- poor response]; APR [residual ypT3; 5/8 LN positiveDr.Smith/Dr.Pandit]  S/p FOLFOX   # SEP 2019- Pre-sacral fluid/mass- increasing ~ 5-6 cm [increased from dec 2017; also NEW retroperitoneal lymph nodes; inguinal adenopathy]; SEP 2018- left supraclavicular; mediastinal; right hilar-retroperitoneal; pelvic mass/node; bilateral inguinal adenopathy; s/p right Ingiunal LNBx - RECURRENT CA [necrotic;insuff tissue for F-One]  # OCT 8th 2018-FOLFIRI [first cycle 5FU alone]; OCT 22nd FOLFIRI; April 2019- significant PR  # MAY 2019- FOLFIRI q 3W [pt pref]  ------------------------------  ---  OCT-NOV 2018-Sepsis x2 sec to UTI [s/p ABx;]  # s/p RT rectal [11/28-last treatment]  # Acute renal insuff [s/p bil PCN]; hematuria- sec to bladder invol [? Contra-indication to avastin]  # LEFT LE DVT [? [facility]- Dr.Schneir]; Eliquis.   # CHF/COPD/CKD/ PN sec to [place]  # [facility] One-PDL-1/ TPS- 0% [2012-rectal specimen];12/26-Omniseq- K-RAS MUTATED;MSS; no targets**     Rectal cancer (HCC)      INTERVAL HISTORY:  Donald Townsend 67 y.o.  male pleasant patient above history of metastatic rectal cancer on chemotherapy is here for follow-up.  Patient was recently evaluated by interventional radiology; regarding right percutaneous nephrostomy tube.  This was capped; however patient noted to have urinary difficulty with increased frequency of urination/urgency.  No fever no chills.  Chronic left leg swelling.  Not any worse.  No blood in stools black or stools.  Review of Systems  Constitutional: Positive for malaise/fatigue. Negative for chills,  diaphoresis, fever and weight loss.  HENT: Negative for nosebleeds and sore throat.   Eyes: Negative for double vision.  Respiratory: Negative for cough, hemoptysis, sputum production, shortness of breath and wheezing.   Cardiovascular: Positive for leg swelling. Negative for chest pain, palpitations and orthopnea.  Gastrointestinal: Negative for abdominal pain, blood in stool, constipation, diarrhea, heartburn, melena, nausea and vomiting.  Genitourinary: Positive for frequency and urgency. Negative for dysuria.  Musculoskeletal: Negative for back pain and joint pain.  Skin: Negative.  Negative for itching and rash.  Neurological: Negative for dizziness, tingling, focal weakness, weakness and headaches.  Endo/Heme/Allergies: Does not bruise/bleed easily.  Psychiatric/Behavioral: Negative for depression. The patient is not nervous/anxious and does not have insomnia.       PAST MEDICAL HISTORY :  Past Medical History:  Diagnosis Date  . A-fib ([place])   . Cancer [facility])    Colon  . CHF (congestive heart failure) ([place])   . Diabetes mellitus without complication ([place])   . Dyspnea   . Elevated lipids   . Hypertension   . PVD (peripheral vascular disease) ([place])     PAST SURGICAL HISTORY :   Past Surgical History:  Procedure Laterality Date  . COLON RESECTION     with colostomy  . COLONOSCOPY    . CYSTOSCOPY WITH STENT PLACEMENT Bilateral 01/18/2017   Procedure: CYSTOSCOPY WITH STENT PLACEMENT;  Surgeon: [name], MD;  Location: [facility] ORS;  Service: Urology;  Laterality: Bilateral;  . IR FLUORO GUIDE PORT INSERTION RIGHT  01/24/2017  . IR NEPHROSTOGRAM LEFT THRU EXISTING ACCESS  09/18/2017  . IR NEPHROSTOGRAM RIGHT THRU EXISTING ACCESS  09/18/2017  . IR NEPHROSTOMY EXCHANGE LEFT  03/14/2017  . IR NEPHROSTOMY EXCHANGE LEFT  05/03/2017  . IR NEPHROSTOMY EXCHANGE LEFT  06/14/2017  . IR NEPHROSTOMY EXCHANGE LEFT  07/26/2017  . IR NEPHROSTOMY EXCHANGE LEFT  09/18/2017  . IR NEPHROSTOMY  EXCHANGE RIGHT  03/14/2017  . IR NEPHROSTOMY EXCHANGE RIGHT  05/03/2017  . IR NEPHROSTOMY EXCHANGE RIGHT  06/14/2017  . IR NEPHROSTOMY EXCHANGE RIGHT  07/26/2017  . IR NEPHROSTOMY PLACEMENT LEFT  01/19/2017  . IR NEPHROSTOMY PLACEMENT RIGHT  01/19/2017  . IVC FILTER REMOVAL N/A 08/28/2016   Procedure: IVC Filter Removal;  Surgeon: Katha Cabal, MD;  Location: [place] [facility];  Service: Cardiovascular;  Laterality: N/A;  . LOWER EXTREMITY VENOGRAPHY Left 12/18/2016   Procedure: Lower Extremity Venography;  Surgeon: Katha Cabal, MD;  Location: [place] [facility];  Service: Cardiovascular;  Laterality: Left;  . PERIPHERAL VASCULAR CATHETERIZATION Left 05/08/2016   Procedure: Lower Extremity Venography;  Surgeon: Katha Cabal, MD;  Location: [place] [facility];  Service: Cardiovascular;  Laterality: Left;    FAMILY HISTORY :   Family History  Problem Relation Age of Onset  . Diabetes Mother   . Stroke Mother   . Prostate cancer Brother   . Cancer Sister        Breast cancer  . Kidney cancer Neg Hx   . Bladder Cancer Neg Hx     SOCIAL HISTORY:   Social History   Tobacco Use  . Smoking status: Never Smoker  . Smokeless tobacco: Never Used  Substance Use Topics  . Alcohol use: No  . Drug use: No    ALLERGIES:  has No Known Allergies.  MEDICATIONS:  Current Outpatient Medications  Medication Sig Dispense Refill  . B-D ULTRAFINE III SHORT PEN 31G X 8 MM MISC     . Continuous Blood Gluc Receiver (FREESTYLE LIBRE READER) DEVI Use 1 Units as directed. Check CBG's four times daily. Dx: E11.9    . Continuous Blood Gluc Sensor (FREESTYLE LIBRE SENSOR SYSTEM) MISC Use 1 Units as directed. Check CBG's four times daily. Dx: E11.9    . ELIQUIS 5 MG TABS tablet Take 5 mg by mouth every 12 (twelve) hours.  0  . feeding supplement, GLUCERNA SHAKE, ([place]) LIQD Follow manufacturer instructions    . furosemide (LASIX) 40 MG tablet Take 40 mg by mouth daily.   0  .  glimepiride (AMARYL) 4 MG tablet Take 1 tablet by mouth daily.    . insulin aspart (NOVOLOG) 100 UNIT/ML FlexPen Inject 7 Units into the skin 3 (three) times daily with meals.     . Lancets 30G MISC Use 1 Units as directed. Check CBG's up to twice daily. Dx: E11.9    . LANTUS SOLOSTAR 100 UNIT/ML Solostar Pen Inject 20 Units into the skin at bedtime.   0  . losartan (COZAAR) 100 MG tablet Take 100 mg by mouth daily.    . metoprolol tartrate (LOPRESSOR) 25 MG tablet Take 25 mg by mouth 2 (two) times daily.     . pravastatin (PRAVACHOL) 20 MG tablet take 76m by mouth daily at bedtime    . tamsulosin (FLOMAX) 0.4 MG CAPS capsule Take by mouth.    .Marland Kitchenacidophilus (RISAQUAD) CAPS capsule Take 1 capsule daily by mouth. (Patient not taking: Reported on 09/25/2017) 30 capsule 0  . HYDROcodone-acetaminophen (NORCO/VICODIN) 5-325 MG tablet Take 1 tablet by mouth every 8 (eight) hours as needed for moderate pain. (Patient not taking: Reported on 09/25/2017) 60 tablet 0  . ondansetron (ZOFRAN) 8 MG tablet 1 pill every  8 hours for nausea/vomitting as needed (Patient not taking: Reported on 09/25/2017) 40 tablet 2  . prochlorperazine (COMPAZINE) 10 MG tablet Take 1 tablet (10 mg total) by mouth every 6 (six) hours as needed for nausea or vomiting. (Patient not taking: Reported on 09/25/2017) 40 tablet 1   No current facility-administered medications for this visit.     PHYSICAL EXAMINATION: ECOG PERFORMANCE STATUS: 1 - Symptomatic but completely ambulatory  BP 119/70   Pulse 87   Temp (!) 97 F (36.1 C) (Tympanic)   Resp 18   Ht _0  (1.778 m)   Wt 249 lb (112.9 kg)   BMI 35.73 kg/m   Filed Weights   09/25/17 0930  Weight: 249 lb (112.9 kg)    GENERAL: Well-nourished well-developed; Alert, no distress and comfortable.  Alone. EYES: no pallor or icterus OROPHARYNX: no thrush or ulceration; NECK: supple; no lymph nodes felt. LYMPH:  no palpable lymphadenopathy in the axillary or inguinal  regions LUNGS: Decreased breath sounds auscultation bilaterally. No wheeze or crackles HEART/CVS: regular rate & rhythm and no murmurs; No lower extremity edema; except for mild/chronic swelling of the left leg ABDOMEN:abdomen soft, non-tender and normal bowel sounds. No hepatomegaly or splenomegaly.  Musculoskeletal:no cyanosis of digits and no clubbing  PSYCH: alert & oriented x 3 with fluent speech NEURO: no focal motor/sensory deficits SKIN:  no rashes or significant lesions    LABORATORY DATA:  I have reviewed the data as listed    Component Value Date/Time   NA 137 09/25/2017 0920   NA 139 12/17/2012 0608   K 4.0 09/25/2017 0920   K 3.8 12/17/2012 0608   CL 104 09/25/2017 0920   CL 108 (H) 12/17/2012 0608   CO2 25 09/25/2017 0920   CO2 26 12/17/2012 0608   GLUCOSE 209 (H) 09/25/2017 0920   GLUCOSE 217 (H) 12/17/2012 0608   BUN 28 (H) 09/25/2017 0920   BUN 21 01/01/2017 1621   BUN 15 12/17/2012 0608   CREATININE 1.54 (H) 09/25/2017 0920   CREATININE 0.84 12/17/2012 0608   CALCIUM 9.9 09/25/2017 0920   CALCIUM 8.2 (L) 12/17/2012 0608   PROT 7.4 09/25/2017 0920   PROT 5.8 (L) 12/17/2012 0608   ALBUMIN 3.6 09/25/2017 0920   ALBUMIN 2.9 (L) 12/17/2012 0608   AST 17 09/25/2017 0920   AST 16 12/17/2012 0608   ALT 15 (L) 09/25/2017 0920   ALT 23 12/17/2012 0608   ALKPHOS 67 09/25/2017 0920   ALKPHOS 78 12/17/2012 0608   BILITOT 0.5 09/25/2017 0920   BILITOT 0.7 12/17/2012 0608   GFRNONAA 45 (L) 09/25/2017 0920   GFRNONAA >60 12/17/2012 0608   GFRAA 53 (L) 09/25/2017 0920   GFRAA >60 12/17/2012 0608    No results found for: SPEP, UPEP  Lab Results  Component Value Date   WBC 7.9 09/25/2017   NEUTROABS 5.5 09/25/2017   HGB 12.9 (L) 09/25/2017   HCT 36.8 (L) 09/25/2017   MCV 87.2 09/25/2017   PLT 242 09/25/2017      Chemistry      Component Value Date/Time   NA 137 09/25/2017 0920   NA 139 12/17/2012 0608   K 4.0 09/25/2017 0920   K 3.8 12/17/2012 0608    CL 104 09/25/2017 0920   CL 108 (H) 12/17/2012 0608   CO2 25 09/25/2017 0920   CO2 26 12/17/2012 0608   BUN 28 (H) 09/25/2017 0920   BUN 21 01/01/2017 1621   BUN 15 12/17/2012 0608   CREATININE  1.54 (H) 09/25/2017 0920   CREATININE 0.84 12/17/2012 0608      Component Value Date/Time   CALCIUM 9.9 09/25/2017 0920   CALCIUM 8.2 (L) 12/17/2012 0608   ALKPHOS 67 09/25/2017 0920   ALKPHOS 78 12/17/2012 0608   AST 17 09/25/2017 0920   AST 16 12/17/2012 0608   ALT 15 (L) 09/25/2017 0920   ALT 23 12/17/2012 0608   BILITOT 0.5 09/25/2017 0920   BILITOT 0.7 12/17/2012 0608       RADIOGRAPHIC STUDIES: I have personally reviewed the radiological images as listed and agreed with the findings in the report. No results found.   ASSESSMENT & PLAN:  Rectal cancer [facility]) # RECURRENT RECTAL CANCER STAGE IV: CT scan April 2019 significant response to therapy.  On FOLFIRI every 3 weeks.  # Continue with FOLFIRI; CBC CMP are reviewed; adequate today; no contraindications to chemotherapy. Proceed with treatment today.    #Right upper quadrant abdominal pain- stable; ultrasound unremarkable  #Chronic kidney disease/ bilateral hydronephrosis; currently right-sided percutaneous nephrostomy tube; currently on a trial for possible explantation; less likely awaiting follow-up with Dr. Wallie Renshaw  # Back pain- secondary to pelvic mass/PCN; continue hydrocodone as needed. STABLE.   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   # follow up 3 weeks/labs/;MD.    Orders Placed This Encounter  Procedures  . CBC with Differential/Platelet    Standing Status:   Standing    Number of Occurrences:   10    Standing Expiration Date:   09/26/2018  . Comprehensive metabolic panel    Standing Status:   Standing    Number of Occurrences:   10    Standing Expiration Date:   09/26/2018  . CEA    Standing Status:   Standing    Number of Occurrences:   10    Standing Expiration  Date:   09/26/2018   All questions were answered. The patient knows to call the clinic with any problems, questions or concerns.      Cammie Sickle, MD 09/25/2017 5:24 PM

## 2017-09-25 NOTE — Assessment & Plan Note (Addendum)
#   RECURRENT RECTAL CANCER STAGE IV: CT scan April 2019 significant response to therapy.  On FOLFIRI every 3 weeks.  # Continue with FOLFIRI; CBC CMP are reviewed; adequate today; no contraindications to chemotherapy. Proceed with treatment today.    #Right upper quadrant abdominal pain- stable; ultrasound unremarkable  #Chronic kidney disease/ bilateral hydronephrosis; currently right-sided percutaneous nephrostomy tube; currently on a trial for possible explantation; less likely awaiting follow-up with Dr. Wallie Renshaw  # Back pain- secondary to pelvic mass/PCN; continue hydrocodone as needed. STABLE.   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   # follow up 3 weeks/labs/;MD.

## 2017-09-25 NOTE — Progress Notes (Signed)
Increase in urinary frequency since nephrostomy cath exchange. Denies any burning with urination or fevers.

## 2017-09-26 LAB — CEA: CEA: 5.6 ng/mL — ABNORMAL HIGH (ref 0.0–4.7)

## 2017-09-27 ENCOUNTER — Inpatient Hospital Stay: Payer: Medicare Other

## 2017-09-27 VITALS — BP 110/69 | HR 90 | Temp 97.0°F | Resp 20

## 2017-09-27 DIAGNOSIS — M7989 Other specified soft tissue disorders: Secondary | ICD-10-CM | POA: Diagnosis not present

## 2017-09-27 DIAGNOSIS — C20 Malignant neoplasm of rectum: Secondary | ICD-10-CM | POA: Diagnosis not present

## 2017-09-27 DIAGNOSIS — G893 Neoplasm related pain (acute) (chronic): Secondary | ICD-10-CM | POA: Diagnosis not present

## 2017-09-27 DIAGNOSIS — Z452 Encounter for adjustment and management of vascular access device: Secondary | ICD-10-CM | POA: Diagnosis not present

## 2017-09-27 DIAGNOSIS — R530 Neoplastic (malignant) related fatigue: Secondary | ICD-10-CM | POA: Diagnosis not present

## 2017-09-27 DIAGNOSIS — R1011 Right upper quadrant pain: Secondary | ICD-10-CM | POA: Diagnosis not present

## 2017-09-27 MED ORDER — SODIUM CHLORIDE 0.9% FLUSH
10.0000 mL | INTRAVENOUS | Status: DC | PRN
  Administered 2017-09-27: 10 mL
  Filled 2017-09-27: qty 10

## 2017-09-27 MED ORDER — HEPARIN SOD (PORK) LOCK FLUSH 100 UNIT/ML IV SOLN
INTRAVENOUS | Status: AC
  Filled 2017-09-27: qty 5

## 2017-09-27 MED ORDER — HEPARIN SOD (PORK) LOCK FLUSH 100 UNIT/ML IV SOLN
500.0000 [IU] | Freq: Once | INTRAVENOUS | Status: AC | PRN
  Administered 2017-09-27: 500 [IU]

## 2017-10-02 DIAGNOSIS — R0602 Shortness of breath: Secondary | ICD-10-CM | POA: Diagnosis not present

## 2017-10-04 ENCOUNTER — Ambulatory Visit
Admission: RE | Admit: 2017-10-04 | Discharge: 2017-10-04 | Disposition: A | Discharge status: Home / Self Care | Payer: Medicare Other | Source: Ambulatory Visit | Attending: Urology | Admitting: Urology

## 2017-10-04 ENCOUNTER — Other Ambulatory Visit: Payer: Self-pay | Admitting: Radiology

## 2017-10-04 DIAGNOSIS — C20 Malignant neoplasm of rectum: Secondary | ICD-10-CM | POA: Diagnosis not present

## 2017-10-04 DIAGNOSIS — Z436 Encounter for attention to other artificial openings of urinary tract: Secondary | ICD-10-CM | POA: Diagnosis not present

## 2017-10-04 DIAGNOSIS — N135 Crossing vessel and stricture of ureter without hydronephrosis: Secondary | ICD-10-CM

## 2017-10-04 DIAGNOSIS — E119 Type 2 diabetes mellitus without complications: Secondary | ICD-10-CM | POA: Diagnosis not present

## 2017-10-04 DIAGNOSIS — N1330 Unspecified hydronephrosis: Secondary | ICD-10-CM

## 2017-10-04 HISTORY — PX: IR NEPHROSTOMY EXCHANGE RIGHT: IMG6070

## 2017-10-04 MED ORDER — IOPAMIDOL (ISOVUE-300) INJECTION 61%
30.0000 mL | Freq: Once | INTRAVENOUS | Status: AC | PRN
  Administered 2017-10-04: 10 mL

## 2017-10-04 MED ORDER — LIDOCAINE HCL (PF) 1 % IJ SOLN
INTRAMUSCULAR | Status: AC
  Filled 2017-10-04: qty 30

## 2017-10-04 NOTE — Progress Notes (Signed)
Dr. Barbie Banner in at bedside to speak with pt. Pt. Is a diabetic; MD states not necessary to check BG unless pt. Symptomatic. Pt. Asymptomatic at present. Pt. Ready & agreeable for procedure.

## 2017-10-04 NOTE — Procedures (Signed)
R PCN exchange EBL 0 Comp 0

## 2017-10-04 NOTE — Discharge Instructions (Signed)
Percutaneous Nephrostomy Home Guide Percutaneous nephrostomy is a procedure to insert a flexible tube into your kidney so that urine can leave your body. This procedure may be done if a medical condition prevents urine from leaving your kidney in the usual way. During the procedure, the nephrostomy tube is inserted in the right or left side of your lower back and is connected to an external drainage bag. After you have a nephrostomy tube placed, urine will collect in the drainage bag outside of your body. You will need to empty and change the drainage bag as needed. You will also need to take steps to care for the area where the nephrostomy tube was inserted (tube insertion site). How do I care for my nephrostomy tube?  Always keep your tubing, the leg bag, or the bedside drainage bag below the level of your kidney so that your urine drains freely.  Avoid activities that would cause bending or pulling of your tubing. Ask your health care provider what activities are safe for you.  When connecting your nephrostomy tube to a drainage bag, make sure that there are no kinks in the tubing and that your urine is draining freely. You may want to gently wrap an elastic bandage over the tubing. This will help keep the tubing in place and prevent it from kinking. Make sure there is no tension on the tubing so it does not become dislodged.  At night, you may want to connect your nephrostomy tube or the leg bag to a larger bedside drainage bag. How do I empty the drainage bag? Empty the leg bag or bedside drainage bag whenever it becomes ? full. Also empty it before you go to sleep. Most drainage bags have a drain at the bottom that allows urine to be emptied. Follow these basic steps: 1. Hold the drainage bag over a toilet or collection container. Use a measuring container if your health care provider told you to measure your urine. 2. Open the drain of the bag and allow the urine to drain out. 3. After all the  urine has drained from the drainage bag, close the drain fully. 4. Flush the urine down the toilet. If a collection container was used, rinse the container.  How do I change the dressing around the nephrostomy tube? Change your dressing and clean your tube exit site as told by your health care provider. You may need to change the dressing every day for the first 2 weeks after having a nephrostomy tube inserted. After the first 2 weeks, you may be told to change the dressing two times a week. Supplies needed:  Mild soap and water.  Split gauze pads, 4  4 inches (10 x 10 cm).  Gauze pads, 4  4 inches (10 x 10 cm).  Paper tape. How to change the dressing: Because of the location of your nephrostomy tube, you may need help from another person to complete dressing changes. Follow these basic steps: 1. Wash hands with soap and water. 2. Gently remove the tape and dressing from around the nephrostomy tube. Be careful not to pull on the tube while removing the dressing. Avoid using scissors because they may damage the tube. 3. Wash the skin around the tube with mild soap and water, rinse well, and pat the skin dry with a clean cloth. 4. Check the skin around the drain for redness, swelling, pus, warmth, or a bad smell. 5. If the drain was sutured to the skin, check the suture to  verify that it is still anchored in the skin. 6. Place two split gauze pads in and around the tube exit site. Do not apply ointments or alcohol to the site. 7. Place a gauze pad on top of the split gauze pad. 8. Coil the tube on top of the gauze. The tubing should rest on the gauze, not on the skin. 9. Place tape around each edge of the gauze pad. 10. Secure the nephrostomy tubing. Make sure that the tube does not kink or become pinched. The tubing should rest on the gauze pad, not on the skin. 11. Dispose of used supplies properly.  How do I flush my nephrostomy tube? Use a saline syringe to rinse out (flush) your  nephrostomy tube as told by your health care provider. Flushing is easier if a three-way stopcock is placed between the tube and the drainage bag. One connection of the stopcock connects to your tube, the second connects to the drainage bag, and the third is usually covered with a cap. The lever on the stopcock points to the direction on the stopcock that is closed to flow. Normally, the lever points in the direction of the cap to allow urine to drain from the tube to the drainage bag. Supplies needed:  Rubbing alcohol wipe.  10 mL 0.9% saline syringe. How to flush the tube: 1. Move the lever of the three-way stopcock so it points toward the drainage bag. 2. Clean the cap with a rubbing alcohol wipe. 3. Screw the tip of a 10 mL 0.9% saline syringe onto the cap. 4. Using the syringe plunger, slowly push the 10 mL 0.9% saline in the syringe over 5-10 seconds. If resistance is met or pain occurs while pushing, stop pushing the saline. 5. Remove the syringe from the cap. 6. Return the stopcock lever to the usual position, pointing in the direction of the cap. 7. Dispose of used supplies properly. How do I replace the drainage bag? Replace the drainage bag, three-way stopcock, and any extension tubing as told by your health care provider. Make sure you always have an extra drainage bag and connecting tubing available. 1. Empty urine from your drainage bag. 2. Gather a new drainage bag, three-way stopcock, and any extension tubing. 3. Remove the drainage bag, three-way stopcock, and any extension tubing from the nephrostomy tube. 4. Attach the new leg bag or bedside drainage bag, three-way stopcock, and any extension tubing to the nephrostomy tube. 5. Dispose of the used drainage bag, three-way stopcock, and any extension.  Contact a health care provider if:  You have problems with any of the valves or tubing.  You have persistent pain or soreness in your back.  You have more redness, swelling,  or pain around your tube insertion site.  You have more fluid or blood coming from your tube insertion site.  Your tube insertion site feels warm to the touch.  You have pus or a bad smell coming from your tube insertion site.  You have increased urine output or you feel burning when urinating. Get help right away if:  You have pain in your abdomen during the first week.  You have chest pain or have trouble breathing.  You have a new appearance of blood in your urine.  You have a fever or chills.  You have back pain that is not relieved by your medicine.  You have decreased urine output.  Your nephrostomy tube comes out. This information is not intended to replace advice given to  you by your health care provider. Make sure you discuss any questions you have with your health care provider. Document Released: 02/04/2013 Document Revised: 01/27/2016 Document Reviewed: 01/27/2016 Elsevier Interactive Patient Education  [employer].

## 2017-10-07 ENCOUNTER — Other Ambulatory Visit: Payer: Self-pay | Admitting: Radiology

## 2017-10-07 DIAGNOSIS — N1330 Unspecified hydronephrosis: Secondary | ICD-10-CM

## 2017-10-07 DIAGNOSIS — N135 Crossing vessel and stricture of ureter without hydronephrosis: Secondary | ICD-10-CM

## 2017-10-16 ENCOUNTER — Inpatient Hospital Stay: Payer: Medicare Other | Attending: Oncology

## 2017-10-16 ENCOUNTER — Inpatient Hospital Stay: Payer: Medicare Other

## 2017-10-16 ENCOUNTER — Other Ambulatory Visit: Payer: Self-pay

## 2017-10-16 ENCOUNTER — Encounter: Payer: Self-pay | Admitting: Oncology

## 2017-10-16 ENCOUNTER — Inpatient Hospital Stay (HOSPITAL_BASED_OUTPATIENT_CLINIC_OR_DEPARTMENT_OTHER): Payer: Medicare Other | Admitting: Oncology

## 2017-10-16 VITALS — BP 111/71 | HR 77 | Temp 98.8°F | Resp 18 | Wt 247.0 lb

## 2017-10-16 DIAGNOSIS — Z86718 Personal history of other venous thrombosis and embolism: Secondary | ICD-10-CM | POA: Diagnosis not present

## 2017-10-16 DIAGNOSIS — R530 Neoplastic (malignant) related fatigue: Secondary | ICD-10-CM | POA: Insufficient documentation

## 2017-10-16 DIAGNOSIS — Z794 Long term (current) use of insulin: Secondary | ICD-10-CM | POA: Insufficient documentation

## 2017-10-16 DIAGNOSIS — Z5111 Encounter for antineoplastic chemotherapy: Secondary | ICD-10-CM | POA: Insufficient documentation

## 2017-10-16 DIAGNOSIS — C20 Malignant neoplasm of rectum: Secondary | ICD-10-CM

## 2017-10-16 DIAGNOSIS — Z7901 Long term (current) use of anticoagulants: Secondary | ICD-10-CM

## 2017-10-16 DIAGNOSIS — E119 Type 2 diabetes mellitus without complications: Secondary | ICD-10-CM | POA: Insufficient documentation

## 2017-10-16 DIAGNOSIS — N189 Chronic kidney disease, unspecified: Secondary | ICD-10-CM | POA: Insufficient documentation

## 2017-10-16 DIAGNOSIS — M7989 Other specified soft tissue disorders: Secondary | ICD-10-CM | POA: Insufficient documentation

## 2017-10-16 LAB — CBC WITH DIFFERENTIAL/PLATELET
BASOS ABS: 0.1 10*3/uL (ref 0–0.1)
Basophils Relative: 1 %
EOS ABS: 0.3 10*3/uL (ref 0–0.7)
Eosinophils Relative: 3 %
HCT: 36.3 % — ABNORMAL LOW (ref 40.0–52.0)
HEMOGLOBIN: 12.6 g/dL — AB (ref 13.0–18.0)
Lymphocytes Relative: 11 %
Lymphs Abs: 1 10*3/uL (ref 1.0–3.6)
MCH: 30.4 pg (ref 26.0–34.0)
MCHC: 34.8 g/dL (ref 32.0–36.0)
MCV: 87.2 fL (ref 80.0–100.0)
Monocytes Absolute: 1 10*3/uL (ref 0.2–1.0)
Monocytes Relative: 12 %
NEUTROS PCT: 73 %
Neutro Abs: 6.7 10*3/uL — ABNORMAL HIGH (ref 1.4–6.5)
Platelets: 250 10*3/uL (ref 150–440)
RBC: 4.16 MIL/uL — AB (ref 4.40–5.90)
RDW: 14.7 % — ABNORMAL HIGH (ref 11.5–14.5)
WBC: 9 10*3/uL (ref 3.8–10.6)

## 2017-10-16 LAB — COMPREHENSIVE METABOLIC PANEL
ALK PHOS: 69 U/L (ref 38–126)
ALT: 14 U/L — AB (ref 17–63)
AST: 19 U/L (ref 15–41)
Albumin: 3.4 g/dL — ABNORMAL LOW (ref 3.5–5.0)
Anion gap: 9 (ref 5–15)
BUN: 25 mg/dL — ABNORMAL HIGH (ref 6–20)
CALCIUM: 9 mg/dL (ref 8.9–10.3)
CHLORIDE: 103 mmol/L (ref 101–111)
CO2: 22 mmol/L (ref 22–32)
CREATININE: 1.68 mg/dL — AB (ref 0.61–1.24)
GFR, EST AFRICAN AMERICAN: 47 mL/min — AB (ref >60)
GFR, EST NON AFRICAN AMERICAN: 41 mL/min — AB (ref >60)
Glucose, Bld: 278 mg/dL — ABNORMAL HIGH (ref 65–99)
Potassium: 3.8 mmol/L (ref 3.5–5.1)
Sodium: 134 mmol/L — ABNORMAL LOW (ref 135–145)
Total Bilirubin: 0.8 mg/dL (ref 0.3–1.2)
Total Protein: 7 g/dL (ref 6.5–8.1)

## 2017-10-16 MED ORDER — PALONOSETRON HCL INJECTION 0.25 MG/5ML
0.2500 mg | Freq: Once | INTRAVENOUS | Status: AC
  Administered 2017-10-16: 0.25 mg via INTRAVENOUS
  Filled 2017-10-16: qty 5

## 2017-10-16 MED ORDER — HEPARIN SOD (PORK) LOCK FLUSH 100 UNIT/ML IV SOLN: 500.0000 [IU] | Freq: Once | INTRAVENOUS | Status: DC

## 2017-10-16 MED ORDER — SODIUM CHLORIDE 0.9% FLUSH
10.0000 mL | Freq: Once | INTRAVENOUS | Status: AC
  Administered 2017-10-16: 10 mL via INTRAVENOUS
  Filled 2017-10-16: qty 10

## 2017-10-16 MED ORDER — FLUOROURACIL CHEMO INJECTION 5 GM/100ML
5000.0000 mg | INTRAVENOUS | Status: DC
  Administered 2017-10-16: 5000 mg via INTRAVENOUS
  Filled 2017-10-16: qty 100

## 2017-10-16 MED ORDER — IRINOTECAN HCL CHEMO INJECTION 100 MG/5ML
150.0000 mg/m2 | Freq: Once | INTRAVENOUS | Status: AC
  Administered 2017-10-16: 360 mg via INTRAVENOUS
  Filled 2017-10-16: qty 15

## 2017-10-16 MED ORDER — ATROPINE SULFATE 1 MG/ML IJ SOLN
0.5000 mg | Freq: Once | INTRAMUSCULAR | Status: AC | PRN
  Administered 2017-10-16: 0.5 mg via INTRAVENOUS
  Filled 2017-10-16: qty 1

## 2017-10-16 MED ORDER — LEUCOVORIN CALCIUM INJECTION 350 MG
1000.0000 mg | Freq: Once | INTRAVENOUS | Status: AC
  Administered 2017-10-16: 1000 mg via INTRAVENOUS
  Filled 2017-10-16: qty 50

## 2017-10-16 MED ORDER — SODIUM CHLORIDE 0.9 % IV SOLN
Freq: Once | INTRAVENOUS | Status: AC
  Administered 2017-10-16: 09:00:00 via INTRAVENOUS
  Filled 2017-10-16: qty 1000

## 2017-10-16 NOTE — Progress Notes (Signed)
[place] OFFICE PROGRESS NOTE  Patient Care Team: Dion Body, MD as PCP - General (Family Medicine) Clent Jacks, RN as Registered Nurse  Cancer Staging No matching staging information was found for the patient.   Oncology History   # 2012- RECTO-SIGMOID-poorly diff STAGE III CA [s/p neo-adj chemo-RT- poor response]; APR [residual ypT3; 5/8 LN positiveDr.Smith/Dr.Pandit]  S/p FOLFOX   # SEP 2019- Pre-sacral fluid/mass- increasing ~ 5-6 cm [increased from dec 2017; also NEW retroperitoneal lymph nodes; inguinal adenopathy]; SEP 2018- left supraclavicular; mediastinal; right hilar-retroperitoneal; pelvic mass/node; bilateral inguinal adenopathy; s/p right Ingiunal LNBx - RECURRENT CA [necrotic;insuff tissue for F-One]  # OCT 8th 2018-FOLFIRI [first cycle 5FU alone]; OCT 22nd FOLFIRI; April 2019- significant PR  # MAY 2019- FOLFIRI q 3W [pt pref]  ------------------------------  ---  OCT-NOV 2018-Sepsis x2 sec to UTI [s/p ABx;]  # s/p RT rectal [11/28-last treatment]  # Acute renal insuff [s/p bil PCN]; hematuria- sec to bladder invol [? Contra-indication to avastin]  # LEFT LE DVT [? [facility]- Dr.Schneir]; Eliquis.   # CHF/COPD/CKD/ PN sec to [place]  # [facility] One-PDL-1/ TPS- 0% [2012-rectal specimen];12/26-Omniseq- K-RAS MUTATED;MSS; no targets**     Rectal cancer (HCC)      INTERVAL HISTORY:  Donald Townsend 67 y.o.  male pleasant patient above history of metastatic rectal cancer presents for assessment for his chemotherapy treatment. I am covering Dr.B to see this patient.  His oncology history was reviewed by me. Recent CT chest abdomen pelvis 08/12/2017 was independently reviewed by me.   Chemotherapy treatment: He reports tolerating chemotherapy well.  Fatigue: chronic and stable.  Chronic lower extremity swelling: stable.  No blood in stools.  No diarrhea.  Obstructive uropathy: Right Percutaneous nephrostomy tube changed on  10/04/17  Patient was recently evaluated by interventional radiology; regarding right percutaneous nephrostomy tube.  This was capped; however patient noted to have urinary difficulty with increased frequency of urination/urgency.  No fever no chills.  Chronic left leg swelling.  Not any worse.  No blood in stools black or stools.  Review of Systems  Constitutional: Positive for malaise/fatigue. Negative for chills, diaphoresis, fever and weight loss.  HENT: Negative for congestion, ear discharge, ear pain, nosebleeds, sinus pain and sore throat.   Eyes: Negative for double vision, photophobia, pain, discharge and redness.  Respiratory: Negative for cough, hemoptysis, sputum production, shortness of breath and wheezing.   Cardiovascular: Positive for leg swelling. Negative for chest pain, palpitations, orthopnea and claudication.  Gastrointestinal: Negative for abdominal pain, blood in stool, constipation, diarrhea, heartburn, melena, nausea and vomiting.  Genitourinary: Positive for frequency and urgency. Negative for dysuria, flank pain and hematuria.  Musculoskeletal: Negative for back pain, joint pain, myalgias and neck pain.  Skin: Negative for itching and rash.  Neurological: Negative for dizziness, tingling, tremors, focal weakness, weakness and headaches.  Endo/Heme/Allergies: Negative for environmental allergies. Does not bruise/bleed easily.  Psychiatric/Behavioral: Negative for depression and hallucinations. The patient is not nervous/anxious and does not have insomnia.       PAST MEDICAL HISTORY :  Past Medical History:  Diagnosis Date  . A-fib ([place])   . Cancer [facility])    Colon  . CHF (congestive heart failure) ([place])   . Diabetes mellitus without complication ([place])   . Dyspnea   . Elevated lipids   . Hypertension   . PVD (peripheral vascular disease) ([place])     PAST SURGICAL HISTORY :   Past Surgical History:  Procedure Laterality Date  .  COLON RESECTION     with  colostomy  . COLONOSCOPY    . CYSTOSCOPY WITH STENT PLACEMENT Bilateral 01/18/2017   Procedure: CYSTOSCOPY WITH STENT PLACEMENT;  Surgeon: [name], MD;  Location: [facility] ORS;  Service: Urology;  Laterality: Bilateral;  . IR FLUORO GUIDE PORT INSERTION RIGHT  01/24/2017  . IR NEPHROSTOGRAM LEFT THRU EXISTING ACCESS  09/18/2017  . IR NEPHROSTOGRAM RIGHT THRU EXISTING ACCESS  09/18/2017  . IR NEPHROSTOMY EXCHANGE LEFT  03/14/2017  . IR NEPHROSTOMY EXCHANGE LEFT  05/03/2017  . IR NEPHROSTOMY EXCHANGE LEFT  06/14/2017  . IR NEPHROSTOMY EXCHANGE LEFT  07/26/2017  . IR NEPHROSTOMY EXCHANGE LEFT  09/18/2017  . IR NEPHROSTOMY EXCHANGE RIGHT  03/14/2017  . IR NEPHROSTOMY EXCHANGE RIGHT  05/03/2017  . IR NEPHROSTOMY EXCHANGE RIGHT  06/14/2017  . IR NEPHROSTOMY EXCHANGE RIGHT  07/26/2017  . IR NEPHROSTOMY EXCHANGE RIGHT  10/04/2017  . IR NEPHROSTOMY PLACEMENT LEFT  01/19/2017  . IR NEPHROSTOMY PLACEMENT RIGHT  01/19/2017  . IVC FILTER REMOVAL N/A 08/28/2016   Procedure: IVC Filter Removal;  Surgeon: Katha Cabal, MD;  Location: [facility];  Service: Cardiovascular;  Laterality: N/A;  . LOWER EXTREMITY VENOGRAPHY Left 12/18/2016   Procedure: Lower Extremity Venography;  Surgeon: Katha Cabal, MD;  Location: [facility];  Service: Cardiovascular;  Laterality: Left;  . PERIPHERAL VASCULAR CATHETERIZATION Left 05/08/2016   Procedure: Lower Extremity Venography;  Surgeon: Katha Cabal, MD;  Location: [facility];  Service: Cardiovascular;  Laterality: Left;    FAMILY HISTORY :   Family History  Problem Relation Age of Onset  . Diabetes Mother   . Stroke Mother   . Prostate cancer Brother   . Cancer Sister        Breast cancer  . Kidney cancer Neg Hx   . Bladder Cancer Neg Hx     SOCIAL HISTORY:   Social History   Tobacco Use  . Smoking status: Never Smoker  . Smokeless tobacco: Never Used  Substance Use Topics  . Alcohol use: No  . Drug use: No     ALLERGIES:  has No Known Allergies.  MEDICATIONS:  Current Outpatient Medications  Medication Sig Dispense Refill  . acidophilus (RISAQUAD) CAPS capsule Take 1 capsule daily by mouth. (Patient not taking: Reported on 09/25/2017) 30 capsule 0  . B-D ULTRAFINE III SHORT PEN 31G X 8 MM MISC     . Continuous Blood Gluc Receiver (FREESTYLE LIBRE READER) DEVI Use 1 Units as directed. Check CBG's four times daily. Dx: E11.9    . Continuous Blood Gluc Sensor (FREESTYLE LIBRE SENSOR SYSTEM) MISC Use 1 Units as directed. Check CBG's four times daily. Dx: E11.9    . ELIQUIS 5 MG TABS tablet Take 5 mg by mouth every 12 (twelve) hours.  0  . feeding supplement, GLUCERNA SHAKE, ([place]) LIQD Follow manufacturer instructions    . furosemide (LASIX) 40 MG tablet Take 40 mg by mouth daily.   0  . glimepiride (AMARYL) 4 MG tablet Take 1 tablet by mouth daily.    [name] HYDROcodone-acetaminophen (NORCO/VICODIN) 5-325 MG tablet Take 1 tablet by mouth every 8 (eight) hours as needed for moderate pain. (Patient not taking: Reported on 09/25/2017) 60 tablet 0  . insulin aspart (NOVOLOG) 100 UNIT/ML FlexPen Inject 7 Units into the skin 3 (three) times daily with meals.     . Lancets 30G MISC Use 1 Units as directed. Check CBG's up to twice daily.  Dx: E11.9    . LANTUS SOLOSTAR 100 UNIT/ML Solostar Pen Inject 20 Units into the skin at bedtime.   0  . losartan (COZAAR) 100 MG tablet Take 100 mg by mouth daily.    . metoprolol tartrate (LOPRESSOR) 25 MG tablet Take 25 mg by mouth 2 (two) times daily.     . ondansetron (ZOFRAN) 8 MG tablet 1 pill every 8 hours for nausea/vomitting as needed (Patient not taking: Reported on 09/25/2017) 40 tablet 2  . pravastatin (PRAVACHOL) 20 MG tablet take 53m by mouth daily at bedtime    . prochlorperazine (COMPAZINE) 10 MG tablet Take 1 tablet (10 mg total) by mouth every 6 (six) hours as needed for nausea or vomiting. (Patient not taking: Reported on 09/25/2017) 40 tablet 1  .  tamsulosin (FLOMAX) 0.4 MG CAPS capsule Take by mouth.     No current facility-administered medications for this visit.    Facility-Administered Medications Ordered in Other Visits  Medication Dose Route Frequency Provider Last Rate Last Dose  . heparin lock flush 100 unit/mL  500 Units Intravenous Once BCammie Sickle MD        PHYSICAL EXAMINATION: ECOG PERFORMANCE STATUS: 1 - Symptomatic but completely ambulatory  BP 111/71 (BP Location: Left Arm, Patient Position: Sitting)   Pulse 77   Temp 98.8 F (37.1 C) (Tympanic)   Resp 18   Wt 247 lb (112 kg)   BMI 35.44 kg/m   Filed Weights   10/16/17 0843  Weight: 247 lb (112 kg)   Physical Exam  Constitutional: He is oriented to person, place, and time and well-developed, well-nourished, and in no distress. No distress.  HENT:  Head: Normocephalic and atraumatic.  Mouth/Throat: Oropharynx is clear and moist. No oropharyngeal exudate.  Eyes: Pupils are equal, round, and reactive to light. EOM are normal. Left eye exhibits no discharge.  Neck: Normal range of motion. Neck supple.  Cardiovascular: Normal rate, regular rhythm and normal heart sounds.  No murmur heard. Pulmonary/Chest: Effort normal. No respiratory distress. He has no wheezes. He has no rales.  Decreased breath sounds bilaterally.   Abdominal: Soft. He exhibits no distension. There is no tenderness. There is no rebound.  Musculoskeletal: Normal range of motion. He exhibits no tenderness.   mild/chronic swelling of the left leg   Lymphadenopathy:    He has no cervical adenopathy.  Neurological: He is alert and oriented to person, place, and time. No cranial nerve deficit. He exhibits normal muscle tone. Coordination normal.  Skin: Skin is warm and dry. He is not diaphoretic. No erythema.  Psychiatric: Affect and judgment normal.     LABORATORY DATA:  I have reviewed the data as listed    Component Value Date/Time   NA 137 09/25/2017 0920   NA 139  12/17/2012 0608   K 4.0 09/25/2017 0920   K 3.8 12/17/2012 0608   CL 104 09/25/2017 0920   CL 108 (H) 12/17/2012 0608   CO2 25 09/25/2017 0920   CO2 26 12/17/2012 0608   GLUCOSE 209 (H) 09/25/2017 0920   GLUCOSE 217 (H) 12/17/2012 0608   BUN 28 (H) 09/25/2017 0920   BUN 21 01/01/2017 1621   BUN 15 12/17/2012 0608   CREATININE 1.54 (H) 09/25/2017 0920   CREATININE 0.84 12/17/2012 0608   CALCIUM 9.9 09/25/2017 0920   CALCIUM 8.2 (L) 12/17/2012 0608   PROT 7.4 09/25/2017 0920   PROT 5.8 (L) 12/17/2012 0608   ALBUMIN 3.6 09/25/2017 0920   ALBUMIN 2.9 (L)  12/17/2012 0608   AST 17 09/25/2017 0920   AST 16 12/17/2012 0608   ALT 15 (L) 09/25/2017 0920   ALT 23 12/17/2012 0608   ALKPHOS 67 09/25/2017 0920   ALKPHOS 78 12/17/2012 0608   BILITOT 0.5 09/25/2017 0920   BILITOT 0.7 12/17/2012 0608   GFRNONAA 45 (L) 09/25/2017 0920   GFRNONAA >60 12/17/2012 0608   GFRAA 53 (L) 09/25/2017 0920   GFRAA >60 12/17/2012 0608    No results found for: SPEP, UPEP  Lab Results  Component Value Date   WBC 7.9 09/25/2017   NEUTROABS 5.5 09/25/2017   HGB 12.9 (L) 09/25/2017   HCT 36.8 (L) 09/25/2017   MCV 87.2 09/25/2017   PLT 242 09/25/2017      Chemistry      Component Value Date/Time   NA 137 09/25/2017 0920   NA 139 12/17/2012 0608   K 4.0 09/25/2017 0920   K 3.8 12/17/2012 0608   CL 104 09/25/2017 0920   CL 108 (H) 12/17/2012 0608   CO2 25 09/25/2017 0920   CO2 26 12/17/2012 0608   BUN 28 (H) 09/25/2017 0920   BUN 21 01/01/2017 1621   BUN 15 12/17/2012 0608   CREATININE 1.54 (H) 09/25/2017 0920   CREATININE 0.84 12/17/2012 0608      Component Value Date/Time   CALCIUM 9.9 09/25/2017 0920   CALCIUM 8.2 (L) 12/17/2012 0608   ALKPHOS 67 09/25/2017 0920   ALKPHOS 78 12/17/2012 0608   AST 17 09/25/2017 0920   AST 16 12/17/2012 0608   ALT 15 (L) 09/25/2017 0920   ALT 23 12/17/2012 0608   BILITOT 0.5 09/25/2017 0920   BILITOT 0.7 12/17/2012 0608       ASSESSMENT &  PLAN:  1. Rectal cancer ([place])   2. Encounter for antineoplastic chemotherapy   3. Chronic kidney disease, unspecified CKD stage   4. History of DVT (deep vein thrombosis)    # Rectal Cancer: Stage IV, recurrent, tolerates chemotherapy.  Labs reviewed , acceptable, proceed with cycle 15 FOLFIRI, he is getting this every 3 weeks per his preference.  Repeat cbc, cmp CEA in 3 weeks and see Dr.B for next cycle.   # CKD/hydronephrosis: PCN right exchanged recently. Kidney function stable.  # History of DVT: continue Eliquis. Hemoglobin stable.   All questions were answered. The patient knows to call the clinic with any problems, questions or concerns.      Earlie Server, MD 10/16/2017 8:31 AM

## 2017-10-16 NOTE — Progress Notes (Signed)
Patient here today for follow up. No concerns voiced.  °

## 2017-10-17 LAB — CEA: CEA: 9.1 ng/mL — ABNORMAL HIGH (ref 0.0–4.7)

## 2017-10-18 ENCOUNTER — Inpatient Hospital Stay: Payer: Medicare Other

## 2017-10-18 VITALS — BP 123/77 | HR 99 | Temp 97.4°F

## 2017-10-18 DIAGNOSIS — R530 Neoplastic (malignant) related fatigue: Secondary | ICD-10-CM | POA: Diagnosis not present

## 2017-10-18 DIAGNOSIS — N189 Chronic kidney disease, unspecified: Secondary | ICD-10-CM | POA: Diagnosis not present

## 2017-10-18 DIAGNOSIS — Z5111 Encounter for antineoplastic chemotherapy: Secondary | ICD-10-CM | POA: Diagnosis not present

## 2017-10-18 DIAGNOSIS — E119 Type 2 diabetes mellitus without complications: Secondary | ICD-10-CM | POA: Diagnosis not present

## 2017-10-18 DIAGNOSIS — M7989 Other specified soft tissue disorders: Secondary | ICD-10-CM | POA: Diagnosis not present

## 2017-10-18 DIAGNOSIS — C20 Malignant neoplasm of rectum: Secondary | ICD-10-CM

## 2017-10-18 MED ORDER — SODIUM CHLORIDE 0.9% FLUSH
10.0000 mL | INTRAVENOUS | Status: DC | PRN
  Administered 2017-10-18: 10 mL
  Filled 2017-10-18: qty 10

## 2017-10-18 MED ORDER — HEPARIN SOD (PORK) LOCK FLUSH 100 UNIT/ML IV SOLN
500.0000 [IU] | Freq: Once | INTRAVENOUS | Status: AC | PRN
  Administered 2017-10-18: 500 [IU]
  Filled 2017-10-18: qty 5

## 2017-10-20 ENCOUNTER — Encounter: Payer: Self-pay | Admitting: Urology

## 2017-10-30 ENCOUNTER — Other Ambulatory Visit: Payer: Self-pay

## 2017-10-30 ENCOUNTER — Other Ambulatory Visit: Payer: Self-pay | Admitting: Radiology

## 2017-10-30 ENCOUNTER — Ambulatory Visit: Payer: Medicare Other

## 2017-10-30 DIAGNOSIS — N131 Hydronephrosis with ureteral stricture, not elsewhere classified: Secondary | ICD-10-CM

## 2017-10-30 DIAGNOSIS — Z01818 Encounter for other preprocedural examination: Secondary | ICD-10-CM

## 2017-10-30 DIAGNOSIS — N135 Crossing vessel and stricture of ureter without hydronephrosis: Secondary | ICD-10-CM | POA: Diagnosis not present

## 2017-10-30 LAB — URINALYSIS, COMPLETE
BILIRUBIN UA: NEGATIVE
Ketones, UA: NEGATIVE
Nitrite, UA: NEGATIVE
PH UA: 5 (ref 5.0–7.5)
Specific Gravity, UA: 1.01 (ref 1.005–1.030)
UUROB: 0.2 mg/dL (ref 0.2–1.0)

## 2017-10-30 LAB — MICROSCOPIC EXAMINATION: EPITHELIAL CELLS (NON RENAL): NONE SEEN /HPF (ref 0–10)

## 2017-10-30 NOTE — Progress Notes (Signed)
Donald Townsend is present in office today for a nurse visit. Pt is scheduled for surgery and needs a urine culture prior.  A 10cc syringe was used to cap bilateral neph tubes. Pt sat for 15 minutes then 10cc of urine was drained from pt right neph tube and 2 cc from pt right neph tube.

## 2017-11-02 LAB — CULTURE, URINE COMPREHENSIVE

## 2017-11-06 ENCOUNTER — Other Ambulatory Visit: Payer: Self-pay | Admitting: Radiology

## 2017-11-06 ENCOUNTER — Inpatient Hospital Stay (HOSPITAL_BASED_OUTPATIENT_CLINIC_OR_DEPARTMENT_OTHER): Payer: Medicare Other | Admitting: Internal Medicine

## 2017-11-06 ENCOUNTER — Inpatient Hospital Stay: Payer: Medicare Other

## 2017-11-06 ENCOUNTER — Inpatient Hospital Stay: Payer: Medicare Other | Attending: Internal Medicine

## 2017-11-06 ENCOUNTER — Telehealth: Payer: Self-pay | Admitting: Radiology

## 2017-11-06 VITALS — BP 132/82 | HR 79 | Temp 97.2°F | Resp 18 | Ht 70.0 in | Wt 248.0 lb

## 2017-11-06 DIAGNOSIS — Z8042 Family history of malignant neoplasm of prostate: Secondary | ICD-10-CM | POA: Diagnosis not present

## 2017-11-06 DIAGNOSIS — Z803 Family history of malignant neoplasm of breast: Secondary | ICD-10-CM | POA: Insufficient documentation

## 2017-11-06 DIAGNOSIS — R3915 Urgency of urination: Secondary | ICD-10-CM | POA: Insufficient documentation

## 2017-11-06 DIAGNOSIS — Z79899 Other long term (current) drug therapy: Secondary | ICD-10-CM

## 2017-11-06 DIAGNOSIS — I4891 Unspecified atrial fibrillation: Secondary | ICD-10-CM | POA: Diagnosis not present

## 2017-11-06 DIAGNOSIS — R110 Nausea: Secondary | ICD-10-CM | POA: Diagnosis not present

## 2017-11-06 DIAGNOSIS — Z452 Encounter for adjustment and management of vascular access device: Secondary | ICD-10-CM | POA: Insufficient documentation

## 2017-11-06 DIAGNOSIS — Z01818 Encounter for other preprocedural examination: Secondary | ICD-10-CM

## 2017-11-06 DIAGNOSIS — N189 Chronic kidney disease, unspecified: Secondary | ICD-10-CM | POA: Diagnosis not present

## 2017-11-06 DIAGNOSIS — Z86718 Personal history of other venous thrombosis and embolism: Secondary | ICD-10-CM

## 2017-11-06 DIAGNOSIS — G893 Neoplasm related pain (acute) (chronic): Secondary | ICD-10-CM | POA: Diagnosis not present

## 2017-11-06 DIAGNOSIS — Z794 Long term (current) use of insulin: Secondary | ICD-10-CM

## 2017-11-06 DIAGNOSIS — C20 Malignant neoplasm of rectum: Secondary | ICD-10-CM

## 2017-11-06 DIAGNOSIS — Z5111 Encounter for antineoplastic chemotherapy: Secondary | ICD-10-CM | POA: Diagnosis not present

## 2017-11-06 DIAGNOSIS — M7989 Other specified soft tissue disorders: Secondary | ICD-10-CM | POA: Insufficient documentation

## 2017-11-06 DIAGNOSIS — Z7901 Long term (current) use of anticoagulants: Secondary | ICD-10-CM | POA: Diagnosis not present

## 2017-11-06 DIAGNOSIS — E119 Type 2 diabetes mellitus without complications: Secondary | ICD-10-CM | POA: Insufficient documentation

## 2017-11-06 DIAGNOSIS — R530 Neoplastic (malignant) related fatigue: Secondary | ICD-10-CM | POA: Insufficient documentation

## 2017-11-06 DIAGNOSIS — N135 Crossing vessel and stricture of ureter without hydronephrosis: Secondary | ICD-10-CM

## 2017-11-06 DIAGNOSIS — T451X5A Adverse effect of antineoplastic and immunosuppressive drugs, initial encounter: Secondary | ICD-10-CM

## 2017-11-06 LAB — CBC WITH DIFFERENTIAL/PLATELET
Basophils Absolute: 0.1 10*3/uL (ref 0–0.1)
Basophils Relative: 1 %
EOS ABS: 0.3 10*3/uL (ref 0–0.7)
Eosinophils Relative: 3 %
HCT: 36.6 % — ABNORMAL LOW (ref 40.0–52.0)
HEMOGLOBIN: 12.9 g/dL — AB (ref 13.0–18.0)
LYMPHS ABS: 1.1 10*3/uL (ref 1.0–3.6)
Lymphocytes Relative: 12 %
MCH: 30.2 pg (ref 26.0–34.0)
MCHC: 35.3 g/dL (ref 32.0–36.0)
MCV: 85.4 fL (ref 80.0–100.0)
Monocytes Absolute: 1.1 10*3/uL — ABNORMAL HIGH (ref 0.2–1.0)
Monocytes Relative: 11 %
NEUTROS PCT: 73 %
Neutro Abs: 7.2 10*3/uL — ABNORMAL HIGH (ref 1.4–6.5)
Platelets: 307 10*3/uL (ref 150–440)
RBC: 4.29 MIL/uL — AB (ref 4.40–5.90)
RDW: 14.7 % — ABNORMAL HIGH (ref 11.5–14.5)
WBC: 9.8 10*3/uL (ref 3.8–10.6)

## 2017-11-06 LAB — COMPREHENSIVE METABOLIC PANEL
ALT: 15 U/L (ref 0–44)
AST: 18 U/L (ref 15–41)
Albumin: 3.5 g/dL (ref 3.5–5.0)
Alkaline Phosphatase: 85 U/L (ref 38–126)
Anion gap: 9 (ref 5–15)
BUN: 28 mg/dL — ABNORMAL HIGH (ref 8–23)
CHLORIDE: 102 mmol/L (ref 98–111)
CO2: 25 mmol/L (ref 22–32)
CREATININE: 1.62 mg/dL — AB (ref 0.61–1.24)
Calcium: 9.3 mg/dL (ref 8.9–10.3)
GFR, EST AFRICAN AMERICAN: 49 mL/min — AB (ref >60)
GFR, EST NON AFRICAN AMERICAN: 43 mL/min — AB (ref >60)
Glucose, Bld: 250 mg/dL — ABNORMAL HIGH (ref 70–99)
Potassium: 3.9 mmol/L (ref 3.5–5.1)
Sodium: 136 mmol/L (ref 135–145)
Total Bilirubin: 0.8 mg/dL (ref 0.3–1.2)
Total Protein: 7.6 g/dL (ref 6.5–8.1)

## 2017-11-06 MED ORDER — SODIUM CHLORIDE 0.9 % IV SOLN
5000.0000 mg | INTRAVENOUS | Status: DC
  Administered 2017-11-06: 5000 mg via INTRAVENOUS
  Filled 2017-11-06: qty 100

## 2017-11-06 MED ORDER — ATROPINE SULFATE 1 MG/ML IJ SOLN
0.5000 mg | Freq: Once | INTRAMUSCULAR | Status: AC | PRN
  Administered 2017-11-06: 0.5 mg via INTRAVENOUS
  Filled 2017-11-06: qty 1

## 2017-11-06 MED ORDER — IRINOTECAN HCL CHEMO INJECTION 100 MG/5ML
150.0000 mg/m2 | Freq: Once | INTRAVENOUS | Status: AC
  Administered 2017-11-06: 360 mg via INTRAVENOUS
  Filled 2017-11-06: qty 15

## 2017-11-06 MED ORDER — DEXTROSE 5 % IV SOLN
1000.0000 mg | Freq: Once | INTRAVENOUS | Status: AC
  Administered 2017-11-06: 1000 mg via INTRAVENOUS
  Filled 2017-11-06: qty 50

## 2017-11-06 MED ORDER — CIPROFLOXACIN HCL 500 MG PO TABS: 250.0000 mg | ORAL_TABLET | Freq: Two times a day (BID) | ORAL | 0 refills | Status: DC

## 2017-11-06 MED ORDER — SODIUM CHLORIDE 0.9 % IV SOLN
Freq: Once | INTRAVENOUS | Status: AC
  Administered 2017-11-06: 10:00:00 via INTRAVENOUS
  Filled 2017-11-06: qty 1000

## 2017-11-06 MED ORDER — LORAZEPAM 2 MG/ML IJ SOLN
1.0000 mg | Freq: Once | INTRAMUSCULAR | Status: AC
  Administered 2017-11-06: 1 mg via INTRAVENOUS
  Filled 2017-11-06: qty 1

## 2017-11-06 MED ORDER — PALONOSETRON HCL INJECTION 0.25 MG/5ML
0.2500 mg | Freq: Once | INTRAVENOUS | Status: AC
  Administered 2017-11-06: 0.25 mg via INTRAVENOUS
  Filled 2017-11-06: qty 5

## 2017-11-06 NOTE — Progress Notes (Signed)
[place] OFFICE PROGRESS NOTE  Patient Care Team: Dion Body, MD as PCP - General (Family Medicine) Clent Jacks, RN as Registered Nurse  Cancer Staging No matching staging information was found for the patient.   Oncology History   # 2012- RECTO-SIGMOID-poorly diff STAGE III CA [s/p neo-adj chemo-RT- poor response]; APR [residual ypT3; 5/8 LN positiveDr.Smith/Dr.Pandit]  S/p FOLFOX   # SEP 2019- Pre-sacral fluid/mass- increasing ~ 5-6 cm [increased from dec 2017; also NEW retroperitoneal lymph nodes; inguinal adenopathy]; SEP 2018- left supraclavicular; mediastinal; right hilar-retroperitoneal; pelvic mass/node; bilateral inguinal adenopathy; s/p right Ingiunal LNBx - RECURRENT CA [necrotic;insuff tissue for F-One]  # OCT 8th 2018-FOLFIRI [first cycle 5FU alone]; OCT 22nd FOLFIRI; April 2019- significant PR  # MAY 2019- FOLFIRI q 3W [pt pref]  ------------------------------  ---  OCT-NOV 2018-Sepsis x2 sec to UTI [s/p ABx;]  # s/p RT rectal [11/28-last treatment]  # Acute renal insuff [s/p bil PCN]; hematuria- sec to bladder invol [? Contra-indication to avastin]  # LEFT LE DVT [? [facility]- Dr.Schneir]; Eliquis.   # CHF/COPD/CKD/ PN sec to [facility]  # [facility]-PDL-1/ TPS- 0% [2012-rectal specimen];12/26-Omniseq- K-RAS MUTATED;MSS; no targets** ---------------------------------------------   DIAGNOSIS: rectal cance  STAGE:   IV  ; GOALS: palliative  CURRENT/MOST RECENT THERAPY: FOLFIRI      Rectal cancer (HCC)      INTERVAL HISTORY:  Donald Townsend 67 y.o.  male pleasant patient above history of recurrent metastatic rectal cancer currently on FOLFIRI every 3 weeks is here for follow-up.  Patient complains of fatigue few days after the first week of chemotherapy.  Complains of nausea; improved with antiemetics.  Patient's biggest complaint is urinary discharge; whitish/foul-smelling.  Patient continues to have percutaneous  nephrostomy tubes.  Denies any blood in urine.  Review of Systems  Constitutional: Positive for malaise/fatigue. Negative for chills, diaphoresis, fever and weight loss.  HENT: Negative for nosebleeds and sore throat.   Eyes: Negative for double vision.  Respiratory: Negative for cough, hemoptysis, sputum production, shortness of breath and wheezing.   Cardiovascular: Negative for chest pain, palpitations, orthopnea and leg swelling.  Gastrointestinal: Positive for nausea. Negative for abdominal pain, blood in stool, constipation, diarrhea, heartburn, melena and vomiting.  Genitourinary: Negative for dysuria (Whitish discharge; intermittent), frequency and urgency.  Musculoskeletal: Negative for back pain and joint pain.  Skin: Negative.  Negative for itching and rash.  Neurological: Negative for dizziness, tingling, focal weakness, weakness and headaches.  Endo/Heme/Allergies: Does not bruise/bleed easily.  Psychiatric/Behavioral: Negative for depression. The patient is not nervous/anxious and does not have insomnia.       PAST MEDICAL HISTORY :  Past Medical History:  Diagnosis Date  . A-fib ([place])   . Cancer [facility])    Colon  . CHF (congestive heart failure) ([place])   . Diabetes mellitus without complication ([place])   . Dyspnea   . Elevated lipids   . Hypertension   . PVD (peripheral vascular disease) ([place])     PAST SURGICAL HISTORY :   Past Surgical History:  Procedure Laterality Date  . COLON RESECTION     with colostomy  . COLONOSCOPY    . CYSTOSCOPY WITH STENT PLACEMENT Bilateral 01/18/2017   Procedure: CYSTOSCOPY WITH STENT PLACEMENT;  Surgeon: [name], MD;  Location: [facility] ORS;  Service: Urology;  Laterality: Bilateral;  . IR FLUORO GUIDE PORT INSERTION RIGHT  01/24/2017  . IR NEPHROSTOGRAM LEFT THRU EXISTING ACCESS  09/18/2017  . IR NEPHROSTOGRAM RIGHT THRU EXISTING  ACCESS  09/18/2017  . IR NEPHROSTOMY EXCHANGE LEFT  03/14/2017  . IR NEPHROSTOMY EXCHANGE LEFT   05/03/2017  . IR NEPHROSTOMY EXCHANGE LEFT  06/14/2017  . IR NEPHROSTOMY EXCHANGE LEFT  07/26/2017  . IR NEPHROSTOMY EXCHANGE LEFT  09/18/2017  . IR NEPHROSTOMY EXCHANGE LEFT  11/08/2017  . IR NEPHROSTOMY EXCHANGE RIGHT  03/14/2017  . IR NEPHROSTOMY EXCHANGE RIGHT  05/03/2017  . IR NEPHROSTOMY EXCHANGE RIGHT  06/14/2017  . IR NEPHROSTOMY EXCHANGE RIGHT  07/26/2017  . IR NEPHROSTOMY EXCHANGE RIGHT  10/04/2017  . IR NEPHROSTOMY EXCHANGE RIGHT  11/08/2017  . IR NEPHROSTOMY PLACEMENT LEFT  01/19/2017  . IR NEPHROSTOMY PLACEMENT RIGHT  01/19/2017  . IVC FILTER REMOVAL N/A 08/28/2016   Procedure: IVC Filter Removal;  Surgeon: Katha Cabal, MD;  Location: [place] [facility];  Service: Cardiovascular;  Laterality: N/A;  . LOWER EXTREMITY VENOGRAPHY Left 12/18/2016   Procedure: Lower Extremity Venography;  Surgeon: Katha Cabal, MD;  Location: [place] [facility];  Service: Cardiovascular;  Laterality: Left;  . PERIPHERAL VASCULAR CATHETERIZATION Left 05/08/2016   Procedure: Lower Extremity Venography;  Surgeon: Katha Cabal, MD;  Location: [place] [facility];  Service: Cardiovascular;  Laterality: Left;    FAMILY HISTORY :   Family History  Problem Relation Age of Onset  . Diabetes Mother   . Stroke Mother   . Prostate cancer Brother   . Cancer Sister        Breast cancer  . Kidney cancer Neg Hx   . Bladder Cancer Neg Hx     SOCIAL HISTORY:   Social History   Tobacco Use  . Smoking status: Never Smoker  . Smokeless tobacco: Never Used  Substance Use Topics  . Alcohol use: No  . Drug use: No    ALLERGIES:  has No Known Allergies.  MEDICATIONS:  Current Outpatient Medications  Medication Sig Dispense Refill  . B-D ULTRAFINE III SHORT PEN 31G X 8 MM MISC     . Continuous Blood Gluc Receiver (FREESTYLE LIBRE READER) DEVI Use 1 Units as directed. Check CBG's four times daily. Dx: E11.9    . Continuous Blood Gluc Sensor (FREESTYLE LIBRE SENSOR SYSTEM) MISC Use 1 Units as  directed. Check CBG's four times daily. Dx: E11.9    . DIGOX 250 MCG tablet Take 0.25 mg by mouth daily.     [name] ELIQUIS 5 MG TABS tablet Take 5 mg by mouth every 12 (twelve) hours.  0  . feeding supplement, GLUCERNA SHAKE, ([place]) LIQD Follow manufacturer instructions    . furosemide (LASIX) 40 MG tablet Take 40 mg by mouth daily.   0  . glimepiride (AMARYL) 4 MG tablet Take 1 tablet by mouth daily.    . insulin aspart (NOVOLOG) 100 UNIT/ML FlexPen Inject 15 Units into the skin 3 (three) times daily with meals. Based on sliding scale    . Lancets 30G MISC Use 1 Units as directed. Check CBG's up to twice daily. Dx: E11.9    . LANTUS SOLOSTAR 100 UNIT/ML Solostar Pen Inject 20 Units into the skin at bedtime.   0  . losartan (COZAAR) 100 MG tablet Take 100 mg by mouth daily.    . metoprolol tartrate (LOPRESSOR) 25 MG tablet Take 25 mg by mouth 2 (two) times daily.     . ondansetron (ZOFRAN) 8 MG tablet 1 pill every 8 hours for nausea/vomitting as needed 40 tablet 2  . pravastatin (PRAVACHOL) 20 MG tablet take 63m by mouth  daily at bedtime    . prochlorperazine (COMPAZINE) 10 MG tablet Take 1 tablet (10 mg total) by mouth every 6 (six) hours as needed for nausea or vomiting. 40 tablet 1  . ciprofloxacin (CIPRO) 500 MG tablet Take 0.5 tablets (250 mg total) by mouth every 12 (twelve) hours. 6 tablet 0  . HYDROcodone-acetaminophen (NORCO/VICODIN) 5-325 MG tablet Take 1 tablet by mouth every 8 (eight) hours as needed for moderate pain. 60 tablet 0   No current facility-administered medications for this visit.     PHYSICAL EXAMINATION: ECOG PERFORMANCE STATUS: 1 - Symptomatic but completely ambulatory  BP 132/82 (Patient Position: Sitting)   Pulse 79   Temp (!) 97.2 F (36.2 C) (Tympanic)   Resp 18   Ht '5\' 10"'  (1.778 m)   Wt 248 lb (112.5 kg)   BMI 35.58 kg/m   Filed Weights   11/06/17 0800  Weight: 248 lb (112.5 kg)    GENERAL: Well-nourished well-developed; Alert, no distress  and comfortable.  Accompanied his daughter eYES: no pallor or icterus OROPHARYNX: no thrush or ulceration; NECK: supple; no lymph nodes felt. LYMPH:  no palpable lymphadenopathy in the axillary or inguinal regions LUNGS: Decreased breath sounds auscultation bilaterally. No wheeze or crackles HEART/CVS: regular rate & rhythm and no murmurs; No lower extremity edema ABDOMEN:abdomen soft, non-tender and normal bowel sounds. No hepatomegaly or splenomegaly.  Positive for colostomy Musculoskeletal:no cyanosis of digits and no clubbing  PSYCH: alert & oriented x 3 with fluent speech NEURO: no focal motor/sensory deficits SKIN:  no rashes or significant lesions    LABORATORY DATA:  I have reviewed the data as listed    Component Value Date/Time   NA 136 11/06/2017 0840   NA 139 12/17/2012 0608   K 3.9 11/06/2017 0840   K 3.8 12/17/2012 0608   CL 102 11/06/2017 0840   CL 108 (H) 12/17/2012 0608   CO2 25 11/06/2017 0840   CO2 26 12/17/2012 0608   GLUCOSE 250 (H) 11/06/2017 0840   GLUCOSE 217 (H) 12/17/2012 0608   BUN 28 (H) 11/06/2017 0840   BUN 21 01/01/2017 1621   BUN 15 12/17/2012 0608   CREATININE 1.62 (H) 11/06/2017 0840   CREATININE 0.84 12/17/2012 0608   CALCIUM 9.3 11/06/2017 0840   CALCIUM 8.2 (L) 12/17/2012 0608   PROT 7.6 11/06/2017 0840   PROT 5.8 (L) 12/17/2012 0608   ALBUMIN 3.5 11/06/2017 0840   ALBUMIN 2.9 (L) 12/17/2012 0608   AST 18 11/06/2017 0840   AST 16 12/17/2012 0608   ALT 15 11/06/2017 0840   ALT 23 12/17/2012 0608   ALKPHOS 85 11/06/2017 0840   ALKPHOS 78 12/17/2012 0608   BILITOT 0.8 11/06/2017 0840   BILITOT 0.7 12/17/2012 0608   GFRNONAA 43 (L) 11/06/2017 0840   GFRNONAA >60 12/17/2012 0608   GFRAA 49 (L) 11/06/2017 0840   GFRAA >60 12/17/2012 0608    No results found for: SPEP, UPEP  Lab Results  Component Value Date   WBC 9.8 11/06/2017   NEUTROABS 7.2 (H) 11/06/2017   HGB 12.9 (L) 11/06/2017   HCT 36.6 (L) 11/06/2017   MCV 85.4  11/06/2017   PLT 307 11/06/2017      Chemistry      Component Value Date/Time   NA 136 11/06/2017 0840   NA 139 12/17/2012 0608   K 3.9 11/06/2017 0840   K 3.8 12/17/2012 0608   CL 102 11/06/2017 0840   CL 108 (H) 12/17/2012 0608   CO2 25  11/06/2017 0840   CO2 26 12/17/2012 0608   BUN 28 (H) 11/06/2017 0840   BUN 21 01/01/2017 1621   BUN 15 12/17/2012 0608   CREATININE 1.62 (H) 11/06/2017 0840   CREATININE 0.84 12/17/2012 0608      Component Value Date/Time   CALCIUM 9.3 11/06/2017 0840   CALCIUM 8.2 (L) 12/17/2012 0608   ALKPHOS 85 11/06/2017 0840   ALKPHOS 78 12/17/2012 0608   AST 18 11/06/2017 0840   AST 16 12/17/2012 0608   ALT 15 11/06/2017 0840   ALT 23 12/17/2012 0608   BILITOT 0.8 11/06/2017 0840   BILITOT 0.7 12/17/2012 0608       RADIOGRAPHIC STUDIES: I have personally reviewed the radiological images as listed and agreed with the findings in the report. Ir Nephrostomy Exchange Left  Result Date: 11/08/2017 INDICATION: Routine nephrostomy exchange. EXAM: BILATERAL NEPHROSTOMY EXCHANGE COMPARISON:  None. MEDICATIONS: None ANESTHESIA/SEDATION: None CONTRAST:  10 cc-administered into the collecting system(s) FLUOROSCOPY TIME:  Fluoroscopy Time:  minutes 36 seconds ( mGy). COMPLICATIONS: None immediate. PROCEDURE: Informed written consent was obtained from the patient after a thorough discussion of the procedural risks, benefits and alternatives. All questions were addressed. Maximal Sterile Barrier Technique was utilized including caps, mask, sterile gowns, sterile gloves, sterile drape, hand hygiene and skin antiseptic. A timeout was performed prior to the initiation of the procedure. Back was prepped and draped in a sterile fashion. 1% lidocaine was utilized for local anesthesia. The 10 French left nephrostomy was cut and exchanged over an Amplatz for a 10 French pigtail catheter. It was looped and string fixed in the left renal pelvis. It was sewn to the skin.  Contrast was injected. The identical procedure was performed for the right 10 French nephrostomy catheter. FINDINGS: Imaging confirms bilateral nephrostomy exchange. Bilateral nephrostomy catheters are coiled in the renal pelvises. IMPRESSION: Successful bilateral 10 French nephrostomy catheter exchange. Electronically Signed   By: Marybelle Killings M.D.   On: 11/08/2017 09:08   Ir Nephrostomy Exchange Right  Result Date: 11/08/2017 INDICATION: Routine nephrostomy exchange. EXAM: BILATERAL NEPHROSTOMY EXCHANGE COMPARISON:  None. MEDICATIONS: None ANESTHESIA/SEDATION: None CONTRAST:  10 cc-administered into the collecting system(s) FLUOROSCOPY TIME:  Fluoroscopy Time:  minutes 36 seconds ( mGy). COMPLICATIONS: None immediate. PROCEDURE: Informed written consent was obtained from the patient after a thorough discussion of the procedural risks, benefits and alternatives. All questions were addressed. Maximal Sterile Barrier Technique was utilized including caps, mask, sterile gowns, sterile gloves, sterile drape, hand hygiene and skin antiseptic. A timeout was performed prior to the initiation of the procedure. Back was prepped and draped in a sterile fashion. 1% lidocaine was utilized for local anesthesia. The 10 French left nephrostomy was cut and exchanged over an Amplatz for a 10 French pigtail catheter. It was looped and string fixed in the left renal pelvis. It was sewn to the skin. Contrast was injected. The identical procedure was performed for the right 10 French nephrostomy catheter. FINDINGS: Imaging confirms bilateral nephrostomy exchange. Bilateral nephrostomy catheters are coiled in the renal pelvises. IMPRESSION: Successful bilateral 10 French nephrostomy catheter exchange. Electronically Signed   By: Marybelle Killings M.D.   On: 11/08/2017 09:08     ASSESSMENT & PLAN:  Rectal cancer [facility]) # RECURRENT RECTAL CANCER STAGE IV: CT scan April 2019 significant response to therapy.  On FOLFIRI every 3 weeks;  however more recently CEA trending up.  # Continue with FOLFIRI; CBC CMP are reviewed; adequate today; no contraindications to chemotherapy. Proceed with treatment today.    #  penile discharge- sec to inflammation/more likle colonization.  Discussed with the daughter/patient that unfortunately this is caused by malignancy involving the bladder.   #Chronic kidney disease/ bilateral hydronephrosis; creatinine is fairly stable at 1.6.  Currently right-sided percutaneous nephrostomy tube; awaiting exhange of tubes this week.  # Back pain- secondary to pelvic mass/PCN; continue hydrocodone as needed. STABLE.   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   # follow up 3 weeks/labs/;MD; FOLFIRI+ CT A/P noncontrast CT scan prior  Addendum: CEA rising 13.5; await imaging FOLFOX versus Lonsurf.  Question use of Avastin given involvement of the bladder by the rectal tumor.   Orders Placed This Encounter  Procedures  . CT Abdomen Pelvis Wo Contrast    Standing Status:   Future    Standing Expiration Date:   11/06/2018    Order Specific Question:   ** REASON FOR EXAM (FREE TEXT)    Answer:   rectal cancer on chemo    Order Specific Question:   Preferred imaging location?    Answer:   [facility]    Order Specific Question:   Is Oral Contrast requested for this exam?    Answer:   Yes, Per Radiology protocol    Order Specific Question:   Radiology Contrast Protocol - do NOT remove file path    Answer:   [email or web address]   All questions were answered. The patient knows to call the clinic with any problems, questions or concerns.      Cammie Sickle, MD 11/09/2017 8:59 AM

## 2017-11-06 NOTE — Progress Notes (Signed)
1122-Patient in BR vomiting. Order for 1mg  Ativan given. Will continue to monitor

## 2017-11-06 NOTE — Assessment & Plan Note (Addendum)
#   RECURRENT RECTAL CANCER STAGE IV: CT scan April 2019 significant response to therapy.  On FOLFIRI every 3 weeks; however more recently CEA trending up.  # Continue with FOLFIRI; CBC CMP are reviewed; adequate today; no contraindications to chemotherapy. Proceed with treatment today.    # penile discharge- sec to inflammation/more likle colonization.  Discussed with the daughter/patient that unfortunately this is caused by malignancy involving the bladder.   #Chronic kidney disease/ bilateral hydronephrosis; creatinine is fairly stable at 1.6.  Currently right-sided percutaneous nephrostomy tube; awaiting exhange of tubes this week.  # Back pain- secondary to pelvic mass/PCN; continue hydrocodone as needed. STABLE.   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   # follow up 3 weeks/labs/;MD; FOLFIRI+ CT A/P noncontrast CT scan prior  Addendum: CEA rising 13.5; await imaging FOLFOX versus Lonsurf.  Question use of Avastin given involvement of the bladder by the rectal tumor.

## 2017-11-06 NOTE — Telephone Encounter (Signed)
Order received from Dr Bernardo Heater for Cipro 250mg  po bid x 3 days beginning day prior to bilateral nephrostomy tube exchange scheduled 11/08/2017. Notified daughter of script sent to pharmacy. Instructions given. Questions answered. Daughter voices understanding.

## 2017-11-07 LAB — CEA: CEA1: 13.5 ng/mL — AB (ref 0.0–4.7)

## 2017-11-08 ENCOUNTER — Ambulatory Visit
Admission: RE | Admit: 2017-11-08 | Discharge: 2017-11-08 | Disposition: A | Discharge status: Home / Self Care | Payer: Medicare Other | Source: Ambulatory Visit | Attending: Urology | Admitting: Urology

## 2017-11-08 ENCOUNTER — Ambulatory Visit: Admission: RE | Admit: 2017-11-08 | Payer: Medicare Other | Source: Ambulatory Visit

## 2017-11-08 ENCOUNTER — Inpatient Hospital Stay: Payer: Medicare Other

## 2017-11-08 DIAGNOSIS — Z436 Encounter for attention to other artificial openings of urinary tract: Secondary | ICD-10-CM | POA: Insufficient documentation

## 2017-11-08 DIAGNOSIS — N135 Crossing vessel and stricture of ureter without hydronephrosis: Secondary | ICD-10-CM

## 2017-11-08 DIAGNOSIS — R3915 Urgency of urination: Secondary | ICD-10-CM | POA: Diagnosis not present

## 2017-11-08 DIAGNOSIS — N1330 Unspecified hydronephrosis: Secondary | ICD-10-CM

## 2017-11-08 DIAGNOSIS — R110 Nausea: Secondary | ICD-10-CM | POA: Diagnosis not present

## 2017-11-08 DIAGNOSIS — C20 Malignant neoplasm of rectum: Secondary | ICD-10-CM

## 2017-11-08 DIAGNOSIS — G893 Neoplasm related pain (acute) (chronic): Secondary | ICD-10-CM | POA: Diagnosis not present

## 2017-11-08 DIAGNOSIS — Z5111 Encounter for antineoplastic chemotherapy: Secondary | ICD-10-CM | POA: Diagnosis not present

## 2017-11-08 DIAGNOSIS — R530 Neoplastic (malignant) related fatigue: Secondary | ICD-10-CM | POA: Diagnosis not present

## 2017-11-08 HISTORY — PX: IR NEPHROSTOMY EXCHANGE RIGHT: IMG6070

## 2017-11-08 HISTORY — PX: IR NEPHROSTOMY EXCHANGE LEFT: IMG6069

## 2017-11-08 LAB — GLUCOSE, CAPILLARY: Glucose-Capillary: 322 mg/dL — ABNORMAL HIGH (ref 70–99)

## 2017-11-08 MED ORDER — SODIUM CHLORIDE 0.9% FLUSH
10.0000 mL | INTRAVENOUS | Status: DC | PRN
  Administered 2017-11-08: 10 mL
  Filled 2017-11-08: qty 10

## 2017-11-08 MED ORDER — HEPARIN SOD (PORK) LOCK FLUSH 100 UNIT/ML IV SOLN
500.0000 [IU] | Freq: Once | INTRAVENOUS | Status: AC | PRN
  Administered 2017-11-08: 500 [IU]
  Filled 2017-11-08: qty 5

## 2017-11-08 MED ORDER — IOPAMIDOL (ISOVUE-300) INJECTION 61%: 30.0000 mL | Freq: Once | INTRAVENOUS | Status: DC | PRN

## 2017-11-11 DIAGNOSIS — H2513 Age-related nuclear cataract, bilateral: Secondary | ICD-10-CM | POA: Diagnosis not present

## 2017-11-25 ENCOUNTER — Ambulatory Visit
Admission: RE | Admit: 2017-11-25 | Discharge: 2017-11-25 | Disposition: A | Discharge status: Home / Self Care | Payer: Medicare Other | Source: Ambulatory Visit | Attending: Internal Medicine | Admitting: Internal Medicine

## 2017-11-25 DIAGNOSIS — I700 Atherosclerosis of aorta: Secondary | ICD-10-CM | POA: Diagnosis not present

## 2017-11-25 DIAGNOSIS — R590 Localized enlarged lymph nodes: Secondary | ICD-10-CM | POA: Insufficient documentation

## 2017-11-25 DIAGNOSIS — C20 Malignant neoplasm of rectum: Secondary | ICD-10-CM

## 2017-11-27 ENCOUNTER — Inpatient Hospital Stay (HOSPITAL_BASED_OUTPATIENT_CLINIC_OR_DEPARTMENT_OTHER): Payer: Medicare Other | Admitting: Internal Medicine

## 2017-11-27 ENCOUNTER — Inpatient Hospital Stay: Payer: Medicare Other

## 2017-11-27 VITALS — BP 122/74 | HR 92 | Temp 97.1°F | Resp 16 | Wt 250.0 lb

## 2017-11-27 DIAGNOSIS — Z5111 Encounter for antineoplastic chemotherapy: Secondary | ICD-10-CM | POA: Diagnosis not present

## 2017-11-27 DIAGNOSIS — E119 Type 2 diabetes mellitus without complications: Secondary | ICD-10-CM

## 2017-11-27 DIAGNOSIS — Z87891 Personal history of nicotine dependence: Secondary | ICD-10-CM

## 2017-11-27 DIAGNOSIS — R530 Neoplastic (malignant) related fatigue: Secondary | ICD-10-CM

## 2017-11-27 DIAGNOSIS — Z86718 Personal history of other venous thrombosis and embolism: Secondary | ICD-10-CM | POA: Diagnosis not present

## 2017-11-27 DIAGNOSIS — R3915 Urgency of urination: Secondary | ICD-10-CM

## 2017-11-27 DIAGNOSIS — G893 Neoplasm related pain (acute) (chronic): Secondary | ICD-10-CM

## 2017-11-27 DIAGNOSIS — R110 Nausea: Secondary | ICD-10-CM | POA: Diagnosis not present

## 2017-11-27 DIAGNOSIS — C20 Malignant neoplasm of rectum: Secondary | ICD-10-CM | POA: Diagnosis not present

## 2017-11-27 DIAGNOSIS — Z794 Long term (current) use of insulin: Secondary | ICD-10-CM

## 2017-11-27 DIAGNOSIS — Z7901 Long term (current) use of anticoagulants: Secondary | ICD-10-CM

## 2017-11-27 DIAGNOSIS — N189 Chronic kidney disease, unspecified: Secondary | ICD-10-CM

## 2017-11-27 DIAGNOSIS — Z79899 Other long term (current) drug therapy: Secondary | ICD-10-CM | POA: Diagnosis not present

## 2017-11-27 DIAGNOSIS — Z803 Family history of malignant neoplasm of breast: Secondary | ICD-10-CM

## 2017-11-27 DIAGNOSIS — I4891 Unspecified atrial fibrillation: Secondary | ICD-10-CM | POA: Diagnosis not present

## 2017-11-27 DIAGNOSIS — Z8042 Family history of malignant neoplasm of prostate: Secondary | ICD-10-CM

## 2017-11-27 LAB — COMPREHENSIVE METABOLIC PANEL
ALBUMIN: 3.3 g/dL — AB (ref 3.5–5.0)
ALT: 18 U/L (ref 0–44)
ANION GAP: 12 (ref 5–15)
AST: 21 U/L (ref 15–41)
Alkaline Phosphatase: 72 U/L (ref 38–126)
BUN: 20 mg/dL (ref 8–23)
CHLORIDE: 102 mmol/L (ref 98–111)
CO2: 23 mmol/L (ref 22–32)
Calcium: 9.1 mg/dL (ref 8.9–10.3)
Creatinine, Ser: 1.52 mg/dL — ABNORMAL HIGH (ref 0.61–1.24)
GFR calc Af Amer: 53 mL/min — ABNORMAL LOW (ref >60)
GFR calc non Af Amer: 46 mL/min — ABNORMAL LOW (ref >60)
GLUCOSE: 288 mg/dL — AB (ref 70–99)
POTASSIUM: 3.9 mmol/L (ref 3.5–5.1)
Sodium: 137 mmol/L (ref 135–145)
Total Bilirubin: 0.9 mg/dL (ref 0.3–1.2)
Total Protein: 7.3 g/dL (ref 6.5–8.1)

## 2017-11-27 LAB — CBC WITH DIFFERENTIAL/PLATELET
BASOS ABS: 0.1 10*3/uL (ref 0–0.1)
Basophils Relative: 1 %
EOS PCT: 3 %
Eosinophils Absolute: 0.3 10*3/uL (ref 0–0.7)
HEMATOCRIT: 36.7 % — AB (ref 40.0–52.0)
Hemoglobin: 12.8 g/dL — ABNORMAL LOW (ref 13.0–18.0)
LYMPHS ABS: 0.9 10*3/uL — AB (ref 1.0–3.6)
LYMPHS PCT: 10 %
MCH: 30.1 pg (ref 26.0–34.0)
MCHC: 34.9 g/dL (ref 32.0–36.0)
MCV: 86.4 fL (ref 80.0–100.0)
Monocytes Absolute: 0.9 10*3/uL (ref 0.2–1.0)
Monocytes Relative: 10 %
NEUTROS ABS: 6.8 10*3/uL — AB (ref 1.4–6.5)
Neutrophils Relative %: 76 %
PLATELETS: 275 10*3/uL (ref 150–440)
RBC: 4.25 MIL/uL — ABNORMAL LOW (ref 4.40–5.90)
RDW: 15 % — ABNORMAL HIGH (ref 11.5–14.5)
WBC: 9 10*3/uL (ref 3.8–10.6)

## 2017-11-27 MED ORDER — PROCHLORPERAZINE MALEATE 5 MG PO TABS
10.0000 mg | ORAL_TABLET | Freq: Once | ORAL | Status: AC
  Administered 2017-11-27: 10 mg via ORAL

## 2017-11-27 MED ORDER — SODIUM CHLORIDE 0.9 % IV SOLN
Freq: Once | INTRAVENOUS | Status: AC
  Administered 2017-11-27: 10:00:00 via INTRAVENOUS
  Filled 2017-11-27: qty 1000

## 2017-11-27 MED ORDER — LORAZEPAM 2 MG/ML IJ SOLN
0.5000 mg | Freq: Once | INTRAMUSCULAR | Status: AC
  Administered 2017-11-27: 0.5 mg via INTRAVENOUS
  Filled 2017-11-27: qty 1

## 2017-11-27 MED ORDER — SODIUM CHLORIDE 0.9 % IV SOLN
5200.0000 mg | INTRAVENOUS | Status: DC
  Administered 2017-11-27: 5200 mg via INTRAVENOUS
  Filled 2017-11-27: qty 100

## 2017-11-27 MED ORDER — IRINOTECAN HCL CHEMO INJECTION 100 MG/5ML
150.0000 mg/m2 | Freq: Once | INTRAVENOUS | Status: AC
  Administered 2017-11-27: 360 mg via INTRAVENOUS
  Filled 2017-11-27: qty 15

## 2017-11-27 MED ORDER — LEUCOVORIN CALCIUM INJECTION 350 MG
1000.0000 mg | Freq: Once | INTRAVENOUS | Status: AC
  Administered 2017-11-27: 1000 mg via INTRAVENOUS
  Filled 2017-11-27: qty 50

## 2017-11-27 MED ORDER — ATROPINE SULFATE 1 MG/ML IJ SOLN
0.5000 mg | Freq: Once | INTRAMUSCULAR | Status: AC | PRN
  Administered 2017-11-27: 0.5 mg via INTRAVENOUS
  Filled 2017-11-27: qty 1

## 2017-11-27 MED ORDER — PALONOSETRON HCL INJECTION 0.25 MG/5ML
0.2500 mg | Freq: Once | INTRAVENOUS | Status: AC
  Administered 2017-11-27: 0.25 mg via INTRAVENOUS
  Filled 2017-11-27: qty 5

## 2017-11-27 NOTE — Assessment & Plan Note (Addendum)
#   RECURRENT RECTAL CANCER STAGE IV: CT scan April 2019 significant response to therapy.  On FOLFIRI every 3 weeks; however more recently CEA trending up. July 29th 2019- CT stable; presacral fluid collection/malignancy slightly increasing right Inguinal LN [1.5cm].  # Continue with FOLFIRI; CBC CMP are reviewed; adequate today; no contraindications to chemotherapy. Proceed with treatment today.    #Discussed the fact CEA is rising-patient is likely losing response to current chemotherapy.  I would recommend use of Lonsurf as the next chemotherapy.  The other options include-Avastin [however concerned about colovesical fistula.];  Oxaliplatin neuropathy.  # penile discharge- sec to inflammation/more likely colonization.  Would not recommend antibiotics at this time.  Discussed regarding referral to ID.  # Chronic kidney disease/ bilateral hydronephrosis; creatinine is fairly stable at 1.6.  Currently right-sided percutaneous nephrostomy tube; status post tubes exchange.  Stable.  # Back pain- secondary to pelvic mass/PCN; continue hydrocodone CEA is rising needed.  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  Stable  # Follow up in 3 weeks/labs; plan to start [place].

## 2017-11-27 NOTE — Progress Notes (Signed)
[facility] OFFICE PROGRESS NOTE  Patient Care Team: Linthavong, Kanhka, MD as PCP - General (Family Medicine) Stanton, Kristi D, RN as Registered Nurse  Cancer Staging No matching staging information was found for the patient.   Oncology History   # 2012- RECTO-SIGMOID-poorly diff STAGE III CA [s/p neo-adj chemo-RT- poor response]; APR [residual ypT3; 5/8 LN positiveDr.Smith/Dr.Pandit]  S/p FOLFOX   # SEP 2019- Pre-sacral fluid/mass- increasing ~ 5-6 cm [increased from dec 2017; also NEW retroperitoneal lymph nodes; inguinal adenopathy]; SEP 2018- left supraclavicular; mediastinal; right hilar-retroperitoneal; pelvic mass/node; bilateral inguinal adenopathy; s/p right Ingiunal LNBx - RECURRENT CA [necrotic;insuff tissue for F-One]  # OCT 8th 2018-FOLFIRI [first cycle 5FU alone]; OCT 22nd FOLFIRI; April 2019- significant PR  # MAY 2019- FOLFIRI q 3W [pt pref]  ------------------------------  ---  OCT-NOV 2018-Sepsis x2 sec to UTI [s/p ABx;]  # s/p RT rectal [11/28-last treatment]  # Acute renal insuff [s/p bil PCN]; hematuria- sec to bladder invol [? Contra-indication to avastin]  # LEFT LE DVT [? May [facility]urner's- Dr.Schneir]; Eliquis.   # CHF/COPD/CKD/ PN sec to Oxali  # Foundation One-PDL-1/ TPS- 0% [2012-rectal specimen];12/26-Omniseq- K-RAS MUTATED;MSS; no targets** ---------------------------------------------   DIAGNOSIS: rectal cancer  STAGE:   IV  ; GOALS: palliative  CURRENT/MOST RECENT THERAPY: FOLFIRI      Rectal cancer (HCC)      INTERVAL HISTORY:  Donald Townsend 66 y.o.  male pleasant patient above history of metastatic rectal cancer currently on FOLFIRI every 3 weeks is here for follow-up/review the results of CAT scan.  Patient complains of worsening fatigue.  No nausea no vomiting.  No significant diarrhea.  Complains of continued urgency urination.  Review of Systems  Constitutional: Positive for malaise/fatigue. Negative for  chills, diaphoresis, fever and weight loss.  HENT: Negative for nosebleeds and sore throat.   Eyes: Negative for double vision.  Respiratory: Negative for cough, hemoptysis, sputum production, shortness of breath and wheezing.   Cardiovascular: Negative for chest pain, palpitations, orthopnea and leg swelling.  Gastrointestinal: Negative for abdominal pain, blood in stool, constipation, diarrhea, heartburn, melena, nausea and vomiting.  Genitourinary: Positive for urgency. Negative for dysuria and frequency.  Musculoskeletal: Positive for back pain. Negative for joint pain.  Skin: Negative.  Negative for itching and rash.  Neurological: Negative for dizziness, tingling, focal weakness, weakness and headaches.  Endo/Heme/Allergies: Does not bruise/bleed easily.  Psychiatric/Behavioral: Negative for depression. The patient is not nervous/anxious and does not have insomnia.       PAST MEDICAL HISTORY :  Past Medical History:  Diagnosis Date  . A-fib (HCC)   . Cancer (HCC)    Colon  . CHF (congestive heart failure) (HCC)   . Diabetes mellitus without complication (HCC)   . Dyspnea   . Elevated lipids   . Hypertension   . PVD (peripheral vascular disease) (HCC)     PAST SURGICAL HISTORY :   Past Surgical History:  Procedure Laterality Date  . COLON RESECTION     with colostomy  . COLONOSCOPY    . CYSTOSCOPY WITH STENT PLACEMENT Bilateral 01/18/2017   Procedure: CYSTOSCOPY WITH STENT PLACEMENT;  Surgeon: Budzyn, Brian James, MD;  Location: [facility] ORS;  Service: Urology;  Laterality: Bilateral;  . IR FLUORO GUIDE PORT INSERTION RIGHT  01/24/2017  . IR NEPHROSTOGRAM LEFT THRU EXISTING ACCESS  09/18/2017  . IR NEPHROSTOGRAM RIGHT THRU EXISTING ACCESS  09/18/2017  . IR NEPHROSTOMY EXCHANGE LEFT  03/14/2017  . IR NEPHROSTOMY EXCHANGE LEFT  05/03/2017  .   IR NEPHROSTOMY EXCHANGE LEFT  06/14/2017  . IR NEPHROSTOMY EXCHANGE LEFT  07/26/2017  . IR NEPHROSTOMY EXCHANGE LEFT  09/18/2017  . IR  NEPHROSTOMY EXCHANGE LEFT  11/08/2017  . IR NEPHROSTOMY EXCHANGE RIGHT  03/14/2017  . IR NEPHROSTOMY EXCHANGE RIGHT  05/03/2017  . IR NEPHROSTOMY EXCHANGE RIGHT  06/14/2017  . IR NEPHROSTOMY EXCHANGE RIGHT  07/26/2017  . IR NEPHROSTOMY EXCHANGE RIGHT  10/04/2017  . IR NEPHROSTOMY EXCHANGE RIGHT  11/08/2017  . IR NEPHROSTOMY PLACEMENT LEFT  01/19/2017  . IR NEPHROSTOMY PLACEMENT RIGHT  01/19/2017  . IVC FILTER REMOVAL N/A 08/28/2016   Procedure: IVC Filter Removal;  Surgeon: Gregory G Schnier, MD;  Location: [facility] INVASIVE CV LAB;  Service: Cardiovascular;  Laterality: N/A;  . LOWER EXTREMITY VENOGRAPHY Left 12/18/2016   Procedure: Lower Extremity Venography;  Surgeon: Schnier, Gregory G, MD;  Location: [facility] INVASIVE CV LAB;  Service: Cardiovascular;  Laterality: Left;  . PERIPHERAL VASCULAR CATHETERIZATION Left 05/08/2016   Procedure: Lower Extremity Venography;  Surgeon: Gregory G Schnier, MD;  Location: [facility] INVASIVE CV LAB;  Service: Cardiovascular;  Laterality: Left;    FAMILY HISTORY :   Family History  Problem Relation Age of Onset  . Diabetes Mother   . Stroke Mother   . Prostate cancer Brother   . Cancer Sister        Breast cancer  . Kidney cancer Neg Hx   . Bladder Cancer Neg Hx     SOCIAL HISTORY:   Social History   Tobacco Use  . Smoking status: Never Smoker  . Smokeless tobacco: Never Used  Substance Use Topics  . Alcohol use: No  . Drug use: No    ALLERGIES:  has No Known Allergies.  MEDICATIONS:  Current Outpatient Medications  Medication Sig Dispense Refill  . B-D ULTRAFINE III SHORT PEN 31G X 8 MM MISC     . ciprofloxacin (CIPRO) 500 MG tablet Take 0.5 tablets (250 mg total) by mouth every 12 (twelve) hours. 6 tablet 0  . Continuous Blood Gluc Receiver (FREESTYLE LIBRE READER) DEVI Use 1 Units as directed. Check CBG's four times daily. Dx: E11.9    . Continuous Blood Gluc Sensor (FREESTYLE LIBRE SENSOR SYSTEM) MISC Use 1 Units as directed. Check CBG's four times  daily. Dx: E11.9    . DIGOX 250 MCG tablet Take 0.25 mg by mouth daily.     . ELIQUIS 5 MG TABS tablet Take 5 mg by mouth every 12 (twelve) hours.  0  . feeding supplement, GLUCERNA SHAKE, (GLUCERNA SHAKE) LIQD Follow manufacturer instructions    . furosemide (LASIX) 40 MG tablet Take 40 mg by mouth daily.   0  . glimepiride (AMARYL) 4 MG tablet Take 1 tablet by mouth daily.    . HYDROcodone-acetaminophen (NORCO/VICODIN) 5-325 MG tablet Take 1 tablet by mouth every 8 (eight) hours as needed for moderate pain. 60 tablet 0  . insulin aspart (NOVOLOG) 100 UNIT/ML FlexPen Inject 15 Units into the skin 3 (three) times daily with meals. Based on sliding scale    . Lancets 30G MISC Use 1 Units as directed. Check CBG's up to twice daily. Dx: E11.9    . LANTUS SOLOSTAR 100 UNIT/ML Solostar Pen Inject 20 Units into the skin at bedtime.   0  . losartan (COZAAR) 100 MG tablet Take 100 mg by mouth daily.    . metoprolol tartrate (LOPRESSOR) 25 MG tablet Take 25 mg by mouth 2 (two) times daily.     . ondansetron (ZOFRAN) 8   MG tablet 1 pill every 8 hours for nausea/vomitting as needed 40 tablet 2  . pravastatin (PRAVACHOL) 20 MG tablet take 70m by mouth daily at bedtime    . prochlorperazine (COMPAZINE) 10 MG tablet Take 1 tablet (10 mg total) by mouth every 6 (six) hours as needed for nausea or vomiting. 40 tablet 1  . trifluridine-tipiracil (LONSURF) 20-8.19 MG tablet Take 4 tablets (80 mg of trifluridine total) by mouth 2 (two) times daily after a meal. 1 hr after AM & PM meals on days 1-5, 8-12. Repeat every 28day 80 tablet 3   No current facility-administered medications for this visit.     PHYSICAL EXAMINATION: ECOG PERFORMANCE STATUS: 1 - Symptomatic but completely ambulatory  BP 122/74 (BP Location: Left Arm, Patient Position: Sitting)   Pulse 92   Temp (!) 97.1 F (36.2 C) (Tympanic)   Resp 16   Wt 250 lb (113.4 kg)   BMI 35.87 kg/m   Filed Weights   11/27/17 0828  Weight: 250 lb (113.4  kg)    GENERAL: Well-nourished well-developed; Alert, no distress and comfortable.  Accompanied his daughter.  EYES: no pallor or icterus OROPHARYNX: no thrush or ulceration; NECK: supple; no lymph nodes felt. LYMPH:  no palpable lymphadenopathy in the axillary or inguinal regions LUNGS: Decreased breath sounds auscultation bilaterally. No wheeze or crackles HEART/CVS: regular rate & rhythm and no murmurs; No lower extremity edema ABDOMEN:abdomen soft, non-tender and normal bowel sounds. No hepatomegaly or splenomegaly.  Musculoskeletal:no cyanosis of digits and no clubbing  PSYCH: alert & oriented x 3 with fluent speech NEURO: no focal motor/sensory deficits SKIN:  no rashes or significant lesions    LABORATORY DATA:  I have reviewed the data as listed    Component Value Date/Time   NA 137 11/27/2017 0815   NA 139 12/17/2012 0608   K 3.9 11/27/2017 0815   K 3.8 12/17/2012 0608   CL 102 11/27/2017 0815   CL 108 (H) 12/17/2012 0608   CO2 23 11/27/2017 0815   CO2 26 12/17/2012 0608   GLUCOSE 288 (H) 11/27/2017 0815   GLUCOSE 217 (H) 12/17/2012 0608   BUN 20 11/27/2017 0815   BUN 21 01/01/2017 1621   BUN 15 12/17/2012 0608   CREATININE 1.52 (H) 11/27/2017 0815   CREATININE 0.84 12/17/2012 0608   CALCIUM 9.1 11/27/2017 0815   CALCIUM 8.2 (L) 12/17/2012 0608   PROT 7.3 11/27/2017 0815   PROT 5.8 (L) 12/17/2012 0608   ALBUMIN 3.3 (L) 11/27/2017 0815   ALBUMIN 2.9 (L) 12/17/2012 0608   AST 21 11/27/2017 0815   AST 16 12/17/2012 0608   ALT 18 11/27/2017 0815   ALT 23 12/17/2012 0608   ALKPHOS 72 11/27/2017 0815   ALKPHOS 78 12/17/2012 0608   BILITOT 0.9 11/27/2017 0815   BILITOT 0.7 12/17/2012 0608   GFRNONAA 46 (L) 11/27/2017 0815   GFRNONAA >60 12/17/2012 0608   GFRAA 53 (L) 11/27/2017 0815   GFRAA >60 12/17/2012 0608    No results found for: SPEP, UPEP  Lab Results  Component Value Date   WBC 9.0 11/27/2017   NEUTROABS 6.8 (H) 11/27/2017   HGB 12.8 (L)  11/27/2017   HCT 36.7 (L) 11/27/2017   MCV 86.4 11/27/2017   PLT 275 11/27/2017      Chemistry      Component Value Date/Time   NA 137 11/27/2017 0815   NA 139 12/17/2012 0608   K 3.9 11/27/2017 0815   K 3.8 12/17/2012 08546  CL 102 11/27/2017 0815   CL 108 (H) 12/17/2012 0608   CO2 23 11/27/2017 0815   CO2 26 12/17/2012 0608   BUN 20 11/27/2017 0815   BUN 21 01/01/2017 1621   BUN 15 12/17/2012 0608   CREATININE 1.52 (H) 11/27/2017 0815   CREATININE 0.84 12/17/2012 0608      Component Value Date/Time   CALCIUM 9.1 11/27/2017 0815   CALCIUM 8.2 (L) 12/17/2012 0608   ALKPHOS 72 11/27/2017 0815   ALKPHOS 78 12/17/2012 0608   AST 21 11/27/2017 0815   AST 16 12/17/2012 0608   ALT 18 11/27/2017 0815   ALT 23 12/17/2012 0608   BILITOT 0.9 11/27/2017 0815   BILITOT 0.7 12/17/2012 0608       RADIOGRAPHIC STUDIES: I have personally reviewed the radiological images as listed and agreed with the findings in the report. No results found.   ASSESSMENT & PLAN:  Rectal cancer (HCC) # RECURRENT RECTAL CANCER STAGE IV: CT scan April 2019 significant response to therapy.  On FOLFIRI every 3 weeks; however more recently CEA trending up. July 29th 2019- CT stable; presacral fluid collection/malignancy slightly increasing right Inguinal LN [1.5cm].  # Continue with FOLFIRI; CBC CMP are reviewed; adequate today; no contraindications to chemotherapy. Proceed with treatment today.    #Discussed the fact CEA is rising-patient is likely losing response to current chemotherapy.  I would recommend use of Lonsurf as the next chemotherapy.  The other options include-Avastin [however concerned about colovesical fistula.];  Oxaliplatin neuropathy.  # penile discharge- sec to inflammation/more likely colonization.  Would not recommend antibiotics at this time.  Discussed regarding referral to ID.  # Chronic kidney disease/ bilateral hydronephrosis; creatinine is fairly stable at 1.6.  Currently  right-sided percutaneous nephrostomy tube; status post tubes exchange.  Stable.  # Back pain- secondary to pelvic mass/PCN; continue hydrocodone CEA is rising needed.  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  Stable  # Follow up in 3 weeks/labs; plan to start Lonsurf.    Orders Placed This Encounter  Procedures  . CBC with Differential    Standing Status:   Future    Standing Expiration Date:   11/28/2018  . Comprehensive metabolic panel    Standing Status:   Future    Standing Expiration Date:   11/28/2018  . CEA    Standing Status:   Future    Standing Expiration Date:   11/28/2018   All questions were answered. The patient knows to call the clinic with any problems, questions or concerns.      Govinda R Brahmanday, MD 12/08/2017 6:51 PM  

## 2017-11-27 NOTE — Progress Notes (Signed)
Folfiri only per Dr. Rogue Bussing.

## 2017-11-28 LAB — CEA: CEA: 16.1 ng/mL — ABNORMAL HIGH (ref 0.0–4.7)

## 2017-11-29 ENCOUNTER — Inpatient Hospital Stay: Payer: Medicare Other | Attending: Internal Medicine

## 2017-11-29 VITALS — BP 101/66 | HR 87 | Temp 97.6°F | Resp 16

## 2017-11-29 DIAGNOSIS — Z794 Long term (current) use of insulin: Secondary | ICD-10-CM | POA: Diagnosis not present

## 2017-11-29 DIAGNOSIS — M7989 Other specified soft tissue disorders: Secondary | ICD-10-CM | POA: Insufficient documentation

## 2017-11-29 DIAGNOSIS — C20 Malignant neoplasm of rectum: Secondary | ICD-10-CM | POA: Insufficient documentation

## 2017-11-29 DIAGNOSIS — Z8042 Family history of malignant neoplasm of prostate: Secondary | ICD-10-CM | POA: Insufficient documentation

## 2017-11-29 DIAGNOSIS — I130 Hypertensive heart and chronic kidney disease with heart failure and stage 1 through stage 4 chronic kidney disease, or unspecified chronic kidney disease: Secondary | ICD-10-CM | POA: Insufficient documentation

## 2017-11-29 DIAGNOSIS — I4891 Unspecified atrial fibrillation: Secondary | ICD-10-CM | POA: Diagnosis not present

## 2017-11-29 DIAGNOSIS — C801 Malignant (primary) neoplasm, unspecified: Secondary | ICD-10-CM

## 2017-11-29 DIAGNOSIS — E1122 Type 2 diabetes mellitus with diabetic chronic kidney disease: Secondary | ICD-10-CM | POA: Insufficient documentation

## 2017-11-29 DIAGNOSIS — Z86718 Personal history of other venous thrombosis and embolism: Secondary | ICD-10-CM | POA: Diagnosis not present

## 2017-11-29 DIAGNOSIS — N189 Chronic kidney disease, unspecified: Secondary | ICD-10-CM | POA: Insufficient documentation

## 2017-11-29 DIAGNOSIS — Z452 Encounter for adjustment and management of vascular access device: Secondary | ICD-10-CM | POA: Diagnosis not present

## 2017-11-29 DIAGNOSIS — R530 Neoplastic (malignant) related fatigue: Secondary | ICD-10-CM | POA: Diagnosis not present

## 2017-11-29 DIAGNOSIS — Z7901 Long term (current) use of anticoagulants: Secondary | ICD-10-CM | POA: Diagnosis not present

## 2017-11-29 DIAGNOSIS — Z803 Family history of malignant neoplasm of breast: Secondary | ICD-10-CM | POA: Insufficient documentation

## 2017-11-29 DIAGNOSIS — M549 Dorsalgia, unspecified: Secondary | ICD-10-CM | POA: Insufficient documentation

## 2017-11-29 DIAGNOSIS — Z79899 Other long term (current) drug therapy: Secondary | ICD-10-CM | POA: Insufficient documentation

## 2017-11-29 DIAGNOSIS — G8929 Other chronic pain: Secondary | ICD-10-CM | POA: Insufficient documentation

## 2017-11-29 MED ORDER — SODIUM CHLORIDE 0.9% FLUSH
10.0000 mL | INTRAVENOUS | Status: DC | PRN
  Administered 2017-11-29: 10 mL via INTRAVENOUS
  Filled 2017-11-29: qty 10

## 2017-11-29 MED ORDER — HEPARIN SOD (PORK) LOCK FLUSH 100 UNIT/ML IV SOLN
500.0000 [IU] | Freq: Once | INTRAVENOUS | Status: AC
  Administered 2017-11-29: 500 [IU] via INTRAVENOUS
  Filled 2017-11-29: qty 5

## 2017-12-04 ENCOUNTER — Other Ambulatory Visit: Payer: Self-pay | Admitting: Radiology

## 2017-12-04 ENCOUNTER — Encounter: Payer: Self-pay | Admitting: Urology

## 2017-12-04 DIAGNOSIS — N1330 Unspecified hydronephrosis: Secondary | ICD-10-CM

## 2017-12-04 DIAGNOSIS — N135 Crossing vessel and stricture of ureter without hydronephrosis: Secondary | ICD-10-CM

## 2017-12-08 MED ORDER — TRIFLURIDINE-TIPIRACIL 20-8.19 MG PO TABS: 35.0000 mg/m2 | ORAL_TABLET | Freq: Two times a day (BID) | ORAL | 3 refills | Status: DC

## 2017-12-09 ENCOUNTER — Telehealth: Payer: Self-pay | Admitting: Pharmacist

## 2017-12-09 ENCOUNTER — Telehealth: Payer: Self-pay | Admitting: Pharmacy Technician

## 2017-12-09 DIAGNOSIS — C20 Malignant neoplasm of rectum: Secondary | ICD-10-CM

## 2017-12-09 MED ORDER — TRIFLURIDINE-TIPIRACIL 20-8.19 MG PO TABS: 35.0000 mg/m2 | ORAL_TABLET | Freq: Two times a day (BID) | ORAL | 3 refills | Status: DC

## 2017-12-09 NOTE — Telephone Encounter (Signed)
Oral Oncology Pharmacist Encounter  Received new prescription for Lonsurf (Trifluridine/Tipiracil)  for the treatment of recurrent rectal cancer, stage IV, planned duration until disease progression or unacceptable drug toxicity.  CBC/CMP from 11/27/17 assessed, no relevant lab abnormalities. Prescription dose and frequency assessed. (BSA 2.36, but the max dose for Lonsurf is 80mg /dose, so dose was capped)  Current medication list in Epic reviewed, no DDIs with Lonsurf identified.  Prescription has been e-scribed to the [facility] for benefits analysis and approval.  Oral Oncology Clinic will continue to follow for insurance authorization, copayment issues, initial counseling and start date.  Darl Pikes, PharmD, BCPS, [facility] Hematology/Oncology Clinical Pharmacist [facility] [place] [facility] [phone]  12/09/2017 9:21 AM

## 2017-12-09 NOTE — Telephone Encounter (Signed)
Oral Oncology Patient Advocate Encounter  Received notification from [facility] that prior authorization for Lonsurf is required.  PA submitted on CoverMyMeds Key [id number] Status is pending  Oral Oncology Clinic will continue to follow.  [place] Patient [place] Phone [phone] Fax [phone] 12/09/2017 11:45 AM

## 2017-12-10 ENCOUNTER — Other Ambulatory Visit: Payer: Self-pay | Admitting: Radiology

## 2017-12-10 DIAGNOSIS — N135 Crossing vessel and stricture of ureter without hydronephrosis: Secondary | ICD-10-CM

## 2017-12-10 MED ORDER — CIPROFLOXACIN HCL 500 MG PO TABS: 250.0000 mg | ORAL_TABLET | Freq: Two times a day (BID) | ORAL | 0 refills | Status: DC

## 2017-12-10 NOTE — Telephone Encounter (Signed)
Oral Oncology Patient Advocate Encounter  Prior Authorization for Frankey Poot has been approved.    PA# [id number] Effective dates: 12/10/2017 through 06/08/2018  Oral Oncology Clinic will continue to follow.   [place] Patient [place] Phone [phone] Fax [phone] 12/10/2017 8:28 AM

## 2017-12-11 ENCOUNTER — Telehealth: Payer: Self-pay | Admitting: Pharmacy Technician

## 2017-12-11 NOTE — Telephone Encounter (Signed)
Oral Oncology Patient Advocate Encounter  Spoke with patients daughter, Nira Conn, about completing an application for [employer] [facility] Patient Support in an effort to reduce patient's out of pocket expense for Lonsurf to $0. I have received the forms signed by the patient and his income information.   Application completed and faxed to [phone] on 12/13/17.   [employer] patient assistance phone number for follow up is [phone].   This encounter will be updated until final determination.  [place] Patient [place] Phone [phone] Fax [phone] 12/16/2017 8:59 AM

## 2017-12-12 NOTE — Telephone Encounter (Signed)
Notified patient of bilateral nephrostomy tube exchanges scheduled 12/20/2017 with arrival to [facility] registration at 8:00. Also notified patient of script sent to pharmacy & to stop Eliquis on 12/18/2017. Instructions given. Questions answered. Patient voices understanding.

## 2017-12-17 NOTE — Telephone Encounter (Signed)
Oral Oncology Patient Advocate Encounter  [employer] representative called and spoke with Donald Townsend to verify information and income on the enrollment form.  After speaking with the patient, the [employer] representative called me to update the income portion of the enrollment form and fax back to them to complete his enrollment.   I faxed the corrected form to [phone].  This encounter will be updated until final determination.  [place] Patient [place] Phone [phone] Fax [phone] 12/17/2017 1:14 PM

## 2017-12-18 ENCOUNTER — Inpatient Hospital Stay (HOSPITAL_BASED_OUTPATIENT_CLINIC_OR_DEPARTMENT_OTHER): Payer: Medicare Other | Admitting: Internal Medicine

## 2017-12-18 ENCOUNTER — Inpatient Hospital Stay: Payer: Medicare Other

## 2017-12-18 VITALS — BP 135/78 | HR 85 | Temp 97.1°F | Resp 16 | Wt 249.6 lb

## 2017-12-18 DIAGNOSIS — Z794 Long term (current) use of insulin: Secondary | ICD-10-CM | POA: Diagnosis not present

## 2017-12-18 DIAGNOSIS — M549 Dorsalgia, unspecified: Secondary | ICD-10-CM | POA: Diagnosis not present

## 2017-12-18 DIAGNOSIS — Z8042 Family history of malignant neoplasm of prostate: Secondary | ICD-10-CM

## 2017-12-18 DIAGNOSIS — M7989 Other specified soft tissue disorders: Secondary | ICD-10-CM

## 2017-12-18 DIAGNOSIS — I4891 Unspecified atrial fibrillation: Secondary | ICD-10-CM | POA: Diagnosis not present

## 2017-12-18 DIAGNOSIS — C20 Malignant neoplasm of rectum: Secondary | ICD-10-CM | POA: Diagnosis not present

## 2017-12-18 DIAGNOSIS — Z7901 Long term (current) use of anticoagulants: Secondary | ICD-10-CM | POA: Diagnosis not present

## 2017-12-18 DIAGNOSIS — N189 Chronic kidney disease, unspecified: Secondary | ICD-10-CM

## 2017-12-18 DIAGNOSIS — Z79899 Other long term (current) drug therapy: Secondary | ICD-10-CM

## 2017-12-18 DIAGNOSIS — I130 Hypertensive heart and chronic kidney disease with heart failure and stage 1 through stage 4 chronic kidney disease, or unspecified chronic kidney disease: Secondary | ICD-10-CM

## 2017-12-18 DIAGNOSIS — G8929 Other chronic pain: Secondary | ICD-10-CM | POA: Diagnosis not present

## 2017-12-18 DIAGNOSIS — R530 Neoplastic (malignant) related fatigue: Secondary | ICD-10-CM

## 2017-12-18 DIAGNOSIS — Z803 Family history of malignant neoplasm of breast: Secondary | ICD-10-CM

## 2017-12-18 DIAGNOSIS — E1122 Type 2 diabetes mellitus with diabetic chronic kidney disease: Secondary | ICD-10-CM | POA: Diagnosis not present

## 2017-12-18 DIAGNOSIS — Z86718 Personal history of other venous thrombosis and embolism: Secondary | ICD-10-CM

## 2017-12-18 LAB — CBC WITH DIFFERENTIAL/PLATELET
BASOS ABS: 0.1 10*3/uL (ref 0–0.1)
Basophils Relative: 1 %
EOS ABS: 0.3 10*3/uL (ref 0–0.7)
EOS PCT: 4 %
HCT: 37.1 % — ABNORMAL LOW (ref 40.0–52.0)
Hemoglobin: 12.8 g/dL — ABNORMAL LOW (ref 13.0–18.0)
LYMPHS PCT: 11 %
Lymphs Abs: 1 10*3/uL (ref 1.0–3.6)
MCH: 29.4 pg (ref 26.0–34.0)
MCHC: 34.5 g/dL (ref 32.0–36.0)
MCV: 85.4 fL (ref 80.0–100.0)
MONO ABS: 0.7 10*3/uL (ref 0.2–1.0)
Monocytes Relative: 8 %
Neutro Abs: 6.6 10*3/uL — ABNORMAL HIGH (ref 1.4–6.5)
Neutrophils Relative %: 76 %
PLATELETS: 308 10*3/uL (ref 150–440)
RBC: 4.34 MIL/uL — ABNORMAL LOW (ref 4.40–5.90)
RDW: 14.9 % — AB (ref 11.5–14.5)
WBC: 8.7 10*3/uL (ref 3.8–10.6)

## 2017-12-18 LAB — COMPREHENSIVE METABOLIC PANEL
ALBUMIN: 3.5 g/dL (ref 3.5–5.0)
ALK PHOS: 90 U/L (ref 38–126)
ALT: 13 U/L (ref 0–44)
AST: 21 U/L (ref 15–41)
Anion gap: 8 (ref 5–15)
BILIRUBIN TOTAL: 0.7 mg/dL (ref 0.3–1.2)
BUN: 21 mg/dL (ref 8–23)
CALCIUM: 8.9 mg/dL (ref 8.9–10.3)
CO2: 19 mmol/L — ABNORMAL LOW (ref 22–32)
CREATININE: 1.63 mg/dL — AB (ref 0.61–1.24)
Chloride: 108 mmol/L (ref 98–111)
GFR calc Af Amer: 49 mL/min — ABNORMAL LOW (ref >60)
GFR calc non Af Amer: 42 mL/min — ABNORMAL LOW (ref >60)
GLUCOSE: 185 mg/dL — AB (ref 70–99)
POTASSIUM: 3.7 mmol/L (ref 3.5–5.1)
Sodium: 135 mmol/L (ref 135–145)
TOTAL PROTEIN: 7.4 g/dL (ref 6.5–8.1)

## 2017-12-18 NOTE — Progress Notes (Signed)
[place] OFFICE PROGRESS NOTE  Patient Care Team: Dion Body, MD as PCP - General (Family Medicine) Clent Jacks, RN as Registered Nurse  Cancer Staging No matching staging information was found for the patient.   Oncology History   # 2012- RECTO-SIGMOID-poorly diff STAGE III CA [s/p neo-adj chemo-RT- poor response]; APR [residual ypT3; 5/8 LN positiveDr.Smith/Dr.Pandit]  S/p FOLFOX   # SEP 2019- Pre-sacral fluid/mass- increasing ~ 5-6 cm [increased from dec 2017; also NEW retroperitoneal lymph nodes; inguinal adenopathy]; SEP 2018- left supraclavicular; mediastinal; right hilar-retroperitoneal; pelvic mass/node; bilateral inguinal adenopathy; s/p right Ingiunal LNBx - RECURRENT CA [necrotic;insuff tissue for F-One]  # OCT 8th 2018-FOLFIRI [first cycle 5FU alone]; OCT 22nd FOLFIRI; April 2019- significant PR  # MAY 2019- FOLFIRI q 3W [pt pref]  ------------------------------  ---  OCT-NOV 2018-Sepsis x2 sec to UTI [s/p ABx;]  # s/p RT rectal [11/28-last treatment]  # Acute renal insuff [s/p bil PCN]; hematuria- sec to bladder invol [? Contra-indication to avastin]  # LEFT LE DVT [? [facility]- Dr.Schneir]; Eliquis.   # CHF/COPD/CKD/ PN sec to [place]  # Foundation One-PDL-1/ TPS- 0% [2012-rectal specimen];12/26-Omniseq- K-RAS MUTATED;MSS; no targets** ---------------------------------------------   DIAGNOSIS: rectal cancer  STAGE:   IV  ; GOALS: palliative  CURRENT/MOST RECENT THERAPY: FOLFIRI      Rectal cancer ([place])      INTERVAL HISTORY:  Donald Townsend 67 y.o.  male pleasant patient above history of metastatic rectal cancer currently on FOLFIRI is here for follow-up.  Given the recent progression noted in the CT scan-end of July 2019; FOLFIRI has been discontinued.  Awaiting to start [place].  Patient denies any nausea vomiting diarrhea.  Complains of moderate fatigue.  Complains of chronic back pain.  Complains of continues  chronic penile discharge.  Review of Systems  Constitutional: Positive for malaise/fatigue. Negative for chills, diaphoresis, fever and weight loss.  HENT: Negative for nosebleeds and sore throat.   Eyes: Negative for double vision.  Respiratory: Negative for cough, hemoptysis, sputum production, shortness of breath and wheezing.   Cardiovascular: Negative for chest pain, palpitations, orthopnea and leg swelling.  Gastrointestinal: Negative for abdominal pain, blood in stool, constipation, diarrhea, heartburn, melena, nausea and vomiting.  Genitourinary: Positive for hematuria. Negative for dysuria, frequency and urgency.  Musculoskeletal: Positive for back pain and joint pain.  Skin: Negative.  Negative for itching and rash.  Neurological: Negative for dizziness, tingling, focal weakness, weakness and headaches.  Endo/Heme/Allergies: Does not bruise/bleed easily.  Psychiatric/Behavioral: Negative for depression. The patient is not nervous/anxious and does not have insomnia.       PAST MEDICAL HISTORY :  Past Medical History:  Diagnosis Date  . A-fib ([place])   . Cancer [facility])    Colon  . CHF (congestive heart failure) ([place])   . Diabetes mellitus without complication ([place])   . Dyspnea   . Elevated lipids   . Hypertension   . PVD (peripheral vascular disease) ([place])     PAST SURGICAL HISTORY :   Past Surgical History:  Procedure Laterality Date  . COLON RESECTION     with colostomy  . COLONOSCOPY    . CYSTOSCOPY WITH STENT PLACEMENT Bilateral 01/18/2017   Procedure: CYSTOSCOPY WITH STENT PLACEMENT;  Surgeon: [name], MD;  Location: [facility] ORS;  Service: Urology;  Laterality: Bilateral;  . IR FLUORO GUIDE PORT INSERTION RIGHT  01/24/2017  . IR NEPHROSTOGRAM LEFT THRU EXISTING ACCESS  09/18/2017  . IR NEPHROSTOGRAM RIGHT THRU EXISTING ACCESS  09/18/2017  . IR NEPHROSTOMY EXCHANGE LEFT  03/14/2017  . IR NEPHROSTOMY EXCHANGE LEFT  05/03/2017  . IR NEPHROSTOMY EXCHANGE LEFT   06/14/2017  . IR NEPHROSTOMY EXCHANGE LEFT  07/26/2017  . IR NEPHROSTOMY EXCHANGE LEFT  09/18/2017  . IR NEPHROSTOMY EXCHANGE LEFT  11/08/2017  . IR NEPHROSTOMY EXCHANGE RIGHT  03/14/2017  . IR NEPHROSTOMY EXCHANGE RIGHT  05/03/2017  . IR NEPHROSTOMY EXCHANGE RIGHT  06/14/2017  . IR NEPHROSTOMY EXCHANGE RIGHT  07/26/2017  . IR NEPHROSTOMY EXCHANGE RIGHT  10/04/2017  . IR NEPHROSTOMY EXCHANGE RIGHT  11/08/2017  . IR NEPHROSTOMY PLACEMENT LEFT  01/19/2017  . IR NEPHROSTOMY PLACEMENT RIGHT  01/19/2017  . IVC FILTER REMOVAL N/A 08/28/2016   Procedure: IVC Filter Removal;  Surgeon: Katha Cabal, MD;  Location: [place] [facility];  Service: Cardiovascular;  Laterality: N/A;  . LOWER EXTREMITY VENOGRAPHY Left 12/18/2016   Procedure: Lower Extremity Venography;  Surgeon: Katha Cabal, MD;  Location: [place] [facility];  Service: Cardiovascular;  Laterality: Left;  . PERIPHERAL VASCULAR CATHETERIZATION Left 05/08/2016   Procedure: Lower Extremity Venography;  Surgeon: Katha Cabal, MD;  Location: [place] [facility];  Service: Cardiovascular;  Laterality: Left;    FAMILY HISTORY :   Family History  Problem Relation Age of Onset  . Diabetes Mother   . Stroke Mother   . Prostate cancer Brother   . Cancer Sister        Breast cancer  . Kidney cancer Neg Hx   . Bladder Cancer Neg Hx     SOCIAL HISTORY:   Social History   Tobacco Use  . Smoking status: Never Smoker  . Smokeless tobacco: Never Used  Substance Use Topics  . Alcohol use: No  . Drug use: No    ALLERGIES:  has No Known Allergies.  MEDICATIONS:  Current Outpatient Medications  Medication Sig Dispense Refill  . B-D ULTRAFINE III SHORT PEN 31G X 8 MM MISC     . ciprofloxacin (CIPRO) 500 MG tablet Take 0.5 tablets (250 mg total) by mouth every 12 (twelve) hours. 6 tablet 0  . Continuous Blood Gluc Receiver (FREESTYLE LIBRE READER) DEVI Use 1 Units as directed. Check CBG's four times daily. Dx: E11.9    . Continuous  Blood Gluc Sensor (FREESTYLE LIBRE SENSOR SYSTEM) MISC Use 1 Units as directed. Check CBG's four times daily. Dx: E11.9    . DIGOX 250 MCG tablet Take 0.25 mg by mouth daily.     [name] ELIQUIS 5 MG TABS tablet Take 5 mg by mouth every 12 (twelve) hours.  0  . feeding supplement, GLUCERNA SHAKE, ([place]) LIQD Follow manufacturer instructions    . furosemide (LASIX) 40 MG tablet Take 40 mg by mouth daily.   0  . glimepiride (AMARYL) 4 MG tablet Take 1 tablet by mouth daily.    [name] HYDROcodone-acetaminophen (NORCO/VICODIN) 5-325 MG tablet Take 1 tablet by mouth every 8 (eight) hours as needed for moderate pain. 60 tablet 0  . Lancets 30G MISC Use 1 Units as directed. Check CBG's up to twice daily. Dx: E11.9    . LANTUS SOLOSTAR 100 UNIT/ML Solostar Pen Inject 20 Units into the skin at bedtime.   0  . losartan (COZAAR) 100 MG tablet Take 100 mg by mouth daily.    . metoprolol tartrate (LOPRESSOR) 25 MG tablet Take 25 mg by mouth 2 (two) times daily.     . ondansetron (ZOFRAN) 8 MG tablet 1 pill every 8 hours  for nausea/vomitting as needed 40 tablet 2  . pravastatin (PRAVACHOL) 20 MG tablet take 2m by mouth daily at bedtime    . prochlorperazine (COMPAZINE) 10 MG tablet Take 1 tablet (10 mg total) by mouth every 6 (six) hours as needed for nausea or vomiting. 40 tablet 1  . trifluridine-tipiracil (LONSURF) 20-8.19 MG tablet Take 4 tablets (80 mg of trifluridine total) by mouth 2 (two) times daily after a meal. Take on days 1-5, 8-12. Repeat every 28day 80 tablet 3   No current facility-administered medications for this visit.     PHYSICAL EXAMINATION: ECOG PERFORMANCE STATUS: 1 - Symptomatic but completely ambulatory  BP 135/78 (BP Location: Left Arm, Patient Position: Sitting)   Pulse 85   Temp (!) 97.1 F (36.2 C) (Tympanic)   Resp 16   Wt 249 lb 9.6 oz (113.2 kg)   BMI 35.81 kg/m   Filed Weights   12/18/17 1030  Weight: 249 lb 9.6 oz (113.2 kg)    Physical Exam  Constitutional:  He is oriented to person, place, and time and well-developed, well-nourished, and in no distress.  HENT:  Head: Normocephalic and atraumatic.  Mouth/Throat: Oropharynx is clear and moist. No oropharyngeal exudate.  Eyes: Pupils are equal, round, and reactive to light.  Neck: Normal range of motion. Neck supple.  Cardiovascular: Normal rate and regular rhythm.  Pulmonary/Chest: No respiratory distress. He has no wheezes.  Abdominal: Soft. Bowel sounds are normal. He exhibits no distension and no mass. There is no tenderness. There is no rebound and no guarding.  Musculoskeletal: Normal range of motion. He exhibits no edema or tenderness.  Neurological: He is alert and oriented to person, place, and time.  Skin: Skin is warm.  Psychiatric: Affect normal.       LABORATORY DATA:  I have reviewed the data as listed    Component Value Date/Time   NA 135 12/18/2017 1001   NA 139 12/17/2012 0608   K 3.7 12/18/2017 1001   K 3.8 12/17/2012 0608   CL 108 12/18/2017 1001   CL 108 (H) 12/17/2012 0608   CO2 19 (L) 12/18/2017 1001   CO2 26 12/17/2012 0608   GLUCOSE 185 (H) 12/18/2017 1001   GLUCOSE 217 (H) 12/17/2012 0608   BUN 21 12/18/2017 1001   BUN 21 01/01/2017 1621   BUN 15 12/17/2012 0608   CREATININE 1.63 (H) 12/18/2017 1001   CREATININE 0.84 12/17/2012 0608   CALCIUM 8.9 12/18/2017 1001   CALCIUM 8.2 (L) 12/17/2012 0608   PROT 7.4 12/18/2017 1001   PROT 5.8 (L) 12/17/2012 0608   ALBUMIN 3.5 12/18/2017 1001   ALBUMIN 2.9 (L) 12/17/2012 0608   AST 21 12/18/2017 1001   AST 16 12/17/2012 0608   ALT 13 12/18/2017 1001   ALT 23 12/17/2012 0608   ALKPHOS 90 12/18/2017 1001   ALKPHOS 78 12/17/2012 0608   BILITOT 0.7 12/18/2017 1001   BILITOT 0.7 12/17/2012 0608   GFRNONAA 42 (L) 12/18/2017 1001   GFRNONAA >60 12/17/2012 0608   GFRAA 49 (L) 12/18/2017 1001   GFRAA >60 12/17/2012 0608    No results found for: SPEP, UPEP  Lab Results  Component Value Date   WBC 8.7  12/18/2017   NEUTROABS 6.6 (H) 12/18/2017   HGB 12.8 (L) 12/18/2017   HCT 37.1 (L) 12/18/2017   MCV 85.4 12/18/2017   PLT 308 12/18/2017      Chemistry      Component Value Date/Time   NA 135 12/18/2017 1001  NA 139 12/17/2012 0608   K 3.7 12/18/2017 1001   K 3.8 12/17/2012 0608   CL 108 12/18/2017 1001   CL 108 (H) 12/17/2012 0608   CO2 19 (L) 12/18/2017 1001   CO2 26 12/17/2012 0608   BUN 21 12/18/2017 1001   BUN 21 01/01/2017 1621   BUN 15 12/17/2012 0608   CREATININE 1.63 (H) 12/18/2017 1001   CREATININE 0.84 12/17/2012 0608      Component Value Date/Time   CALCIUM 8.9 12/18/2017 1001   CALCIUM 8.2 (L) 12/17/2012 0608   ALKPHOS 90 12/18/2017 1001   ALKPHOS 78 12/17/2012 0608   AST 21 12/18/2017 1001   AST 16 12/17/2012 0608   ALT 13 12/18/2017 1001   ALT 23 12/17/2012 0608   BILITOT 0.7 12/18/2017 1001   BILITOT 0.7 12/17/2012 0608       RADIOGRAPHIC STUDIES: I have personally reviewed the radiological images as listed and agreed with the findings in the report. No results found.   ASSESSMENT & PLAN:  Rectal cancer ([place]) # RECURRENT RECTAL CANCER STAGE IV:  On FOLFIRI every 3 weeks; however more recently CEA trending up. July 29th 2019- CT stable; presacral fluid collection/malignancy slightly increasing right Inguinal LN [1.5cm].   #Recommend discontinuation of FOLFIRI.  Recommend starting Lonsurf.  Awaiting approval.  Patient can get started once available.  Discussed with pharmacy.  Again reviewed the side effects diarrhea sores in the mouth.  # Chronic kidney disease/ bilateral hydronephrosis; creatinine is fairly stable at 1.6.  Currently right-sided percutaneous nephrostomy tube; status post tubes exchange.  Stable  # Back pain- secondary to pelvic mass/PCN; continue hydrocodone CEA is rising needed.  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  Stable  # Follow up in 3 weeks/labs/CEA/MD.    Orders Placed This  Encounter  Procedures  . Comprehensive metabolic panel    Standing Status:   Future    Standing Expiration Date:   12/19/2018  . CBC with Differential    Standing Status:   Future    Standing Expiration Date:   12/19/2018  . CEA    Standing Status:   Future    Standing Expiration Date:   12/19/2018   All questions were answered. The patient knows to call the clinic with any problems, questions or concerns.      Cammie Sickle, MD 12/18/2017 11:09 AM

## 2017-12-18 NOTE — Assessment & Plan Note (Addendum)
#   RECURRENT RECTAL CANCER STAGE IV:  On FOLFIRI every 3 weeks; however more recently CEA trending up. July 29th 2019- CT stable; presacral fluid collection/malignancy slightly increasing right Inguinal LN [1.5cm].   #Recommend discontinuation of FOLFIRI.  Recommend starting Lonsurf.  Awaiting approval.  Patient can get started once available.  Discussed with pharmacy.  Again reviewed the side effects diarrhea sores in the mouth.  # Chronic kidney disease/ bilateral hydronephrosis; creatinine is fairly stable at 1.6.  Currently right-sided percutaneous nephrostomy tube; status post tubes exchange.  Stable  # Back pain- secondary to pelvic mass/PCN; continue hydrocodone CEA is rising needed.  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  Stable  # Follow up in 3 weeks/labs/CEA/MD.

## 2017-12-19 LAB — CEA: CEA: 18.2 ng/mL — ABNORMAL HIGH (ref 0.0–4.7)

## 2017-12-19 NOTE — Telephone Encounter (Signed)
Oral Oncology Patient Advocate Encounter  Received notification from [place] Patient Assistance program that patient has been successfully enrolled into their program to receive Lonsurf from the manufacturer at $0 out of pocket from 12/18/2017 until 04/28/2018.   He knows we will have to re-apply.   Patient knows to call the office with questions or concerns.   Oral Oncology Clinic will continue to follow.  [place] Patient [place] Phone [phone] Fax [phone] 12/19/2017 2:30 PM

## 2017-12-20 ENCOUNTER — Ambulatory Visit
Admission: RE | Admit: 2017-12-20 | Discharge: 2017-12-20 | Disposition: A | Discharge status: Home / Self Care | Payer: Medicare Other | Source: Ambulatory Visit | Attending: Urology | Admitting: Urology

## 2017-12-20 ENCOUNTER — Encounter: Payer: Self-pay | Admitting: Interventional Radiology

## 2017-12-20 ENCOUNTER — Telehealth: Payer: Self-pay | Admitting: Pharmacist

## 2017-12-20 DIAGNOSIS — Z794 Long term (current) use of insulin: Secondary | ICD-10-CM | POA: Diagnosis not present

## 2017-12-20 DIAGNOSIS — E1159 Type 2 diabetes mellitus with other circulatory complications: Secondary | ICD-10-CM | POA: Diagnosis not present

## 2017-12-20 DIAGNOSIS — I10 Essential (primary) hypertension: Secondary | ICD-10-CM | POA: Diagnosis not present

## 2017-12-20 DIAGNOSIS — N135 Crossing vessel and stricture of ureter without hydronephrosis: Secondary | ICD-10-CM | POA: Diagnosis not present

## 2017-12-20 DIAGNOSIS — N1330 Unspecified hydronephrosis: Secondary | ICD-10-CM

## 2017-12-20 DIAGNOSIS — Z85048 Personal history of other malignant neoplasm of rectum, rectosigmoid junction, and anus: Secondary | ICD-10-CM | POA: Insufficient documentation

## 2017-12-20 DIAGNOSIS — E1169 Type 2 diabetes mellitus with other specified complication: Secondary | ICD-10-CM | POA: Diagnosis not present

## 2017-12-20 DIAGNOSIS — Z436 Encounter for attention to other artificial openings of urinary tract: Secondary | ICD-10-CM | POA: Insufficient documentation

## 2017-12-20 DIAGNOSIS — E785 Hyperlipidemia, unspecified: Secondary | ICD-10-CM | POA: Diagnosis not present

## 2017-12-20 DIAGNOSIS — E1142 Type 2 diabetes mellitus with diabetic polyneuropathy: Secondary | ICD-10-CM | POA: Diagnosis not present

## 2017-12-20 HISTORY — PX: IR NEPHROSTOMY EXCHANGE RIGHT: IMG6070

## 2017-12-20 HISTORY — PX: IR NEPHROSTOMY EXCHANGE LEFT: IMG6069

## 2017-12-20 MED ORDER — IOPAMIDOL (ISOVUE-300) INJECTION 61%
30.0000 mL | Freq: Once | INTRAVENOUS | Status: AC | PRN
  Administered 2017-12-20: 20 mL via INTRAVENOUS

## 2017-12-20 MED ORDER — LIDOCAINE HCL (PF) 1 % IJ SOLN
INTRAMUSCULAR | Status: AC
  Filled 2017-12-20: qty 30

## 2017-12-20 NOTE — Telephone Encounter (Signed)
Oral Chemotherapy Pharmacist Encounter   Called Mr. Elizebeth Koller to provide him with patient education. He was not at home when I called and he asked if he could call me back when he had the medication in front on him. I did let him know that he should not start the [place] until Monday 12/23/17. He will give me a call back later.   Darl Pikes, PharmD, BCPS, [facility] Hematology/Oncology Clinical Pharmacist [facility] [phone]  12/20/2017 10:27 AM

## 2017-12-23 NOTE — Telephone Encounter (Signed)
Oral Chemotherapy Pharmacist Encounter  Spoke with Mr. Authement and he started his medication this morning 12/23/17.  Patient Education I spoke with patient for overview of new oral chemotherapy medication: Lonsurf for the treatment of metastatic rectal cancer, planned duration until disease progression or unacceptable drug toxicity.   Pt is doing well. Counseled patient on administration, dosing, side effects, monitoring, drug-food interactions, safe handling, storage, and disposal. Patient will take 4 tablets (80 mg of trifluridine total) by mouth 2 (two) times daily after a meal. Take on days 1-5, 8-12. Repeat every 28day.  Side effects include but not limited to: diarrhea, decreased hgb/plt/wbc, fatigue, N/V.    Reviewed with patient importance of keeping a medication schedule and plan for any missed doses.  Mr. Caster voiced understanding and appreciation. All questions answered. Medication handout placed in the mail.  Provided patient with [facility] phone number. Patient knows to call the office with questions or concerns. Oral Chemotherapy Navigation Clinic will continue to follow.  Darl Pikes, PharmD, BCPS, [facility] Hematology/Oncology Clinical Pharmacist [facility] [phone]  12/23/2017 1:48 PM

## 2017-12-23 NOTE — Telephone Encounter (Signed)
Oral Chemotherapy Pharmacist Encounter   Did not receive a call back from Mr. Sheppard Friday. I attempted to call him today, no answer and unable to leave a VM. Will attempt another call later.   Darl Pikes, PharmD, BCPS, [facility] Hematology/Oncology Clinical Pharmacist [facility] [place] [facility] [phone]  12/23/2017 9:34 AM

## 2017-12-31 ENCOUNTER — Ambulatory Visit (INDEPENDENT_AMBULATORY_CARE_PROVIDER_SITE_OTHER): Payer: Medicare Other | Admitting: Urology

## 2017-12-31 ENCOUNTER — Encounter: Payer: Self-pay | Admitting: Urology

## 2017-12-31 VITALS — BP 114/72 | HR 74 | Ht 70.0 in | Wt 249.0 lb

## 2017-12-31 DIAGNOSIS — C20 Malignant neoplasm of rectum: Secondary | ICD-10-CM

## 2017-12-31 DIAGNOSIS — N135 Crossing vessel and stricture of ureter without hydronephrosis: Secondary | ICD-10-CM | POA: Diagnosis not present

## 2017-12-31 NOTE — Progress Notes (Signed)
12/31/2017 10:34 AM   Donald [place] 06-14-1950 [id number]  Referring provider: Dion Body, MD [facility] [place], [place] [address]  Chief Complaint  Patient presents with  . Hydronephrosis    3 month follow up    HPI: 67 year old male with stage IV metastatic colorectal cancer with mass-effect involving the bladder, hydronephrosis status post bilateral nephrostomy tube placement after failed attempt at retrograde stent placement on 01/18/2017. At that time, he was noted to have significant distortion of the normal bladder architecture related to presumed infiltration of colon cancer invading into the bladder wall.   Several episodes of urosepsis since nephrostomy tube placement.None recently.  Nephrostomy tubes last exchanged on 12/20/2017 .  He was treated the day before, the day after the day of nephrostomy tube exchange with Cipro without any septic events.  This seems be working well.    He continues to have a decrease amount of output from the left compared to the right which is been the case since they were placed.   He failed clamping trial of tubes 10/2017.    He continues to have penile discharge of variable amounts, generally small daily.  Occasionally bloody.  This is stable.  He is under the care of Dr. Rogue Bussing, currently undergoing treatment with Arvin Collard now transitioning to oral pills.  CEA is rising slightly.  Most recent CT abd/ pelvis 11/25/2017 with mild increased right inguinal adenopathy.     PMH: Past Medical History:  Diagnosis Date  . A-fib ([place])   . Cancer [facility])    Colon  . CHF (congestive heart failure) ([place])   . Diabetes mellitus without complication ([place])   . Dyspnea   . Elevated lipids   . Hypertension   . PVD (peripheral vascular disease) [facility])     Surgical History: Past Surgical History:  Procedure Laterality Date  . COLON RESECTION     with colostomy  . COLONOSCOPY    . CYSTOSCOPY WITH STENT  PLACEMENT Bilateral 01/18/2017   Procedure: CYSTOSCOPY WITH STENT PLACEMENT;  Surgeon: [name], MD;  Location: [facility] ORS;  Service: Urology;  Laterality: Bilateral;  . IR FLUORO GUIDE PORT INSERTION RIGHT  01/24/2017  . IR NEPHROSTOGRAM LEFT THRU EXISTING ACCESS  09/18/2017  . IR NEPHROSTOGRAM RIGHT THRU EXISTING ACCESS  09/18/2017  . IR NEPHROSTOMY EXCHANGE LEFT  03/14/2017  . IR NEPHROSTOMY EXCHANGE LEFT  05/03/2017  . IR NEPHROSTOMY EXCHANGE LEFT  06/14/2017  . IR NEPHROSTOMY EXCHANGE LEFT  07/26/2017  . IR NEPHROSTOMY EXCHANGE LEFT  09/18/2017  . IR NEPHROSTOMY EXCHANGE LEFT  11/08/2017  . IR NEPHROSTOMY EXCHANGE LEFT  12/20/2017  . IR NEPHROSTOMY EXCHANGE RIGHT  03/14/2017  . IR NEPHROSTOMY EXCHANGE RIGHT  05/03/2017  . IR NEPHROSTOMY EXCHANGE RIGHT  06/14/2017  . IR NEPHROSTOMY EXCHANGE RIGHT  07/26/2017  . IR NEPHROSTOMY EXCHANGE RIGHT  10/04/2017  . IR NEPHROSTOMY EXCHANGE RIGHT  11/08/2017  . IR NEPHROSTOMY EXCHANGE RIGHT  12/20/2017  . IR NEPHROSTOMY PLACEMENT LEFT  01/19/2017  . IR NEPHROSTOMY PLACEMENT RIGHT  01/19/2017  . IVC FILTER REMOVAL N/A 08/28/2016   Procedure: IVC Filter Removal;  Surgeon: Katha Cabal, MD;  Location: [place] CV LAB;  Service: Cardiovascular;  Laterality: N/A;  . LOWER EXTREMITY VENOGRAPHY Left 12/18/2016   Procedure: Lower Extremity Venography;  Surgeon: Katha Cabal, MD;  Location: [place] CV LAB;  Service: Cardiovascular;  Laterality: Left;  . PERIPHERAL VASCULAR CATHETERIZATION Left 05/08/2016   Procedure: Lower Extremity Venography;  Surgeon: Katha Cabal, MD;  Location: [place] CV LAB;  Service: Cardiovascular;  Laterality: Left;    Home Medications:  Allergies as of 12/31/2017   No Known Allergies     Medication List        Accurate as of 12/31/17 10:34 AM. Always use your most recent med list.          B-D ULTRAFINE III SHORT PEN 31G X 8 MM Misc Generic drug:  Insulin Pen Needle   DIGOX 0.25 MG tablet Generic  drug:  digoxin Take 0.25 mg by mouth daily.   ELIQUIS 5 MG Tabs tablet Generic drug:  apixaban Take 5 mg by mouth every 12 (twelve) hours.   feeding supplement (GLUCERNA SHAKE) Liqd Follow manufacturer instructions   FREESTYLE LIBRE READER Devi Use 1 Units as directed. Check CBG's four times daily. Dx: E11.9   FREESTYLE LIBRE SENSOR SYSTEM Misc Use 1 Units as directed. Check CBG's four times daily. Dx: E11.9   furosemide 40 MG tablet Commonly known as:  LASIX Take 40 mg by mouth daily.   glimepiride 4 MG tablet Commonly known as:  AMARYL Take 1 tablet by mouth daily.   Lancets 30G Misc Use 1 Units as directed. Check CBG's up to twice daily. Dx: E11.9   LANTUS SOLOSTAR 100 UNIT/ML Solostar Pen Generic drug:  Insulin Glargine Inject 20 Units into the skin at bedtime.   losartan 100 MG tablet Commonly known as:  COZAAR Take 100 mg by mouth daily.   metoprolol tartrate 25 MG tablet Commonly known as:  LOPRESSOR Take 25 mg by mouth 2 (two) times daily.   pravastatin 20 MG tablet Commonly known as:  PRAVACHOL take 20mg  by mouth daily at bedtime   prochlorperazine 10 MG tablet Commonly known as:  COMPAZINE Take 1 tablet (10 mg total) by mouth every 6 (six) hours as needed for nausea or vomiting.   trifluridine-tipiracil 20-8.19 MG tablet Commonly known as:  LONSURF Take 4 tablets (80 mg of trifluridine total) by mouth 2 (two) times daily after a meal. Take on days 1-5, 8-12. Repeat every 28day       Allergies: No Known Allergies  Family History: Family History  Problem Relation Age of Onset  . Diabetes Mother   . Stroke Mother   . Prostate cancer Brother   . Cancer Sister        Breast cancer  . Kidney cancer Neg Hx   . Bladder Cancer Neg Hx     Social History:  reports that he has never smoked. He has never used smokeless tobacco. He reports that he does not drink alcohol or use drugs.  ROS: UROLOGY Frequent Urination?: No Hard to postpone  urination?: No Burning/pain with urination?: No Get up at night to urinate?: Yes Leakage of urine?: No Urine stream starts and stops?: No Trouble starting stream?: No Do you have to strain to urinate?: No Blood in urine?: Yes Urinary tract infection?: No Sexually transmitted disease?: No Injury to kidneys or bladder?: No Painful intercourse?: No Weak stream?: No Erection problems?: No Penile pain?: No  Gastrointestinal Nausea?: No Vomiting?: No Indigestion/heartburn?: No Diarrhea?: No Constipation?: No  Constitutional Fever: No Night sweats?: No Weight loss?: No Fatigue?: No  Skin Skin rash/lesions?: No Itching?: No  Eyes Blurred vision?: No Double vision?: No  Ears/Nose/Throat Sore throat?: No Sinus problems?: No  Hematologic/Lymphatic Swollen glands?: No Easy bruising?: No  Cardiovascular Leg swelling?: No Chest pain?: No  Respiratory Cough?: No Shortness of breath?: No  Endocrine Excessive thirst?: No  Musculoskeletal Back pain?: No Joint pain?: No  Neurological Headaches?: No Dizziness?: No  Psychologic Depression?: No Anxiety?: No  Physical Exam: BP 114/72   Pulse 74   Ht 5\' 10"  (1.778 m)   Wt 249 lb (112.9 kg)   BMI 35.73 kg/m   Constitutional:  Alert and oriented, No acute distress. HEENT: [employer] AT, moist mucus membranes.  Trachea midline, no masses. Cardiovascular: No clubbing, cyanosis, or edema. Respiratory: Normal respiratory effort, no increased work of breathing. GI: Abdomen is soft, nontender, nondistended, no abdominal masses GU: No CVA tenderness Skin: No rashes, bruises or suspicious lesions. Neurologic: Grossly intact, no focal deficits, moving all 4 extremities. Psychiatric: Normal mood and affect.  Laboratory Data: Lab Results  Component Value Date   WBC 8.7 12/18/2017   HGB 12.8 (L) 12/18/2017   HCT 37.1 (L) 12/18/2017   MCV 85.4 12/18/2017   PLT 308 12/18/2017    Lab Results  Component Value Date    CREATININE 1.63 (H) 12/18/2017    Lab Results  Component Value Date   HGBA1C 7.5 (H) 01/19/2017    Urinalysis n/a  Pertinent Imaging: CT scan from 11/25/2016 personally reviewed today  Assessment & Plan:    1. Bilateral ureteral obstruction Continue q6-8 weeks tube exchanges Will continue to use periprocedural cipro q3 days (starting day prior to exchange) as this seems to be working well  Failed previous clamp trials Stable penile discharge, anticipated  2. Rectal cancer [facility]) Managed by Dr. Rogue Bussing, may be progressing again   Return in about 6 months (around 07/01/2018).  Hollice Espy, MD  [facility] [address], [place], [place] [address] [phone]

## 2018-01-07 DIAGNOSIS — Z0000 Encounter for general adult medical examination without abnormal findings: Secondary | ICD-10-CM | POA: Diagnosis not present

## 2018-01-07 DIAGNOSIS — E785 Hyperlipidemia, unspecified: Secondary | ICD-10-CM | POA: Diagnosis not present

## 2018-01-07 DIAGNOSIS — E1169 Type 2 diabetes mellitus with other specified complication: Secondary | ICD-10-CM | POA: Diagnosis not present

## 2018-01-07 DIAGNOSIS — N289 Disorder of kidney and ureter, unspecified: Secondary | ICD-10-CM | POA: Diagnosis not present

## 2018-01-07 DIAGNOSIS — Z23 Encounter for immunization: Secondary | ICD-10-CM | POA: Diagnosis not present

## 2018-01-08 ENCOUNTER — Inpatient Hospital Stay (HOSPITAL_BASED_OUTPATIENT_CLINIC_OR_DEPARTMENT_OTHER): Payer: Medicare Other | Admitting: Internal Medicine

## 2018-01-08 ENCOUNTER — Inpatient Hospital Stay: Payer: Medicare Other | Attending: Internal Medicine

## 2018-01-08 ENCOUNTER — Encounter: Payer: Self-pay | Admitting: Internal Medicine

## 2018-01-08 VITALS — BP 121/80 | HR 71 | Temp 97.9°F | Resp 16

## 2018-01-08 DIAGNOSIS — Z79899 Other long term (current) drug therapy: Secondary | ICD-10-CM

## 2018-01-08 DIAGNOSIS — M549 Dorsalgia, unspecified: Secondary | ICD-10-CM | POA: Insufficient documentation

## 2018-01-08 DIAGNOSIS — I509 Heart failure, unspecified: Secondary | ICD-10-CM | POA: Diagnosis not present

## 2018-01-08 DIAGNOSIS — C20 Malignant neoplasm of rectum: Secondary | ICD-10-CM | POA: Diagnosis not present

## 2018-01-08 DIAGNOSIS — N189 Chronic kidney disease, unspecified: Secondary | ICD-10-CM

## 2018-01-08 DIAGNOSIS — I4891 Unspecified atrial fibrillation: Secondary | ICD-10-CM | POA: Insufficient documentation

## 2018-01-08 DIAGNOSIS — I130 Hypertensive heart and chronic kidney disease with heart failure and stage 1 through stage 4 chronic kidney disease, or unspecified chronic kidney disease: Secondary | ICD-10-CM | POA: Insufficient documentation

## 2018-01-08 DIAGNOSIS — Z794 Long term (current) use of insulin: Secondary | ICD-10-CM | POA: Diagnosis not present

## 2018-01-08 DIAGNOSIS — N390 Urinary tract infection, site not specified: Secondary | ICD-10-CM | POA: Diagnosis not present

## 2018-01-08 DIAGNOSIS — Z86718 Personal history of other venous thrombosis and embolism: Secondary | ICD-10-CM

## 2018-01-08 DIAGNOSIS — R5383 Other fatigue: Secondary | ICD-10-CM

## 2018-01-08 DIAGNOSIS — G8929 Other chronic pain: Secondary | ICD-10-CM

## 2018-01-08 DIAGNOSIS — E1122 Type 2 diabetes mellitus with diabetic chronic kidney disease: Secondary | ICD-10-CM | POA: Diagnosis not present

## 2018-01-08 DIAGNOSIS — Z7901 Long term (current) use of anticoagulants: Secondary | ICD-10-CM

## 2018-01-08 DIAGNOSIS — Z7984 Long term (current) use of oral hypoglycemic drugs: Secondary | ICD-10-CM | POA: Diagnosis not present

## 2018-01-08 LAB — COMPREHENSIVE METABOLIC PANEL
ALBUMIN: 3.4 g/dL — AB (ref 3.5–5.0)
ALT: 13 U/L (ref 0–44)
ANION GAP: 10 (ref 5–15)
AST: 17 U/L (ref 15–41)
Alkaline Phosphatase: 77 U/L (ref 38–126)
BUN: 31 mg/dL — ABNORMAL HIGH (ref 8–23)
CHLORIDE: 105 mmol/L (ref 98–111)
CO2: 21 mmol/L — AB (ref 22–32)
Calcium: 9.3 mg/dL (ref 8.9–10.3)
Creatinine, Ser: 1.57 mg/dL — ABNORMAL HIGH (ref 0.61–1.24)
GFR calc Af Amer: 51 mL/min — ABNORMAL LOW (ref >60)
GFR calc non Af Amer: 44 mL/min — ABNORMAL LOW (ref >60)
Glucose, Bld: 188 mg/dL — ABNORMAL HIGH (ref 70–99)
POTASSIUM: 4 mmol/L (ref 3.5–5.1)
SODIUM: 136 mmol/L (ref 135–145)
Total Bilirubin: 0.5 mg/dL (ref 0.3–1.2)
Total Protein: 7.4 g/dL (ref 6.5–8.1)

## 2018-01-08 LAB — CBC WITH DIFFERENTIAL/PLATELET
Basophils Absolute: 0.1 K/uL (ref 0–0.1)
Basophils Relative: 1 %
Eosinophils Absolute: 0.7 K/uL (ref 0–0.7)
Eosinophils Relative: 7 %
HCT: 33.6 % — ABNORMAL LOW (ref 40.0–52.0)
Hemoglobin: 11.7 g/dL — ABNORMAL LOW (ref 13.0–18.0)
Lymphocytes Relative: 12 %
Lymphs Abs: 1.1 K/uL (ref 1.0–3.6)
MCH: 29.6 pg (ref 26.0–34.0)
MCHC: 34.7 g/dL (ref 32.0–36.0)
MCV: 85.4 fL (ref 80.0–100.0)
Monocytes Absolute: 0.7 K/uL (ref 0.2–1.0)
Monocytes Relative: 8 %
Neutro Abs: 7.1 K/uL — ABNORMAL HIGH (ref 1.4–6.5)
Neutrophils Relative %: 72 %
Platelets: 268 K/uL (ref 150–440)
RBC: 3.94 MIL/uL — ABNORMAL LOW (ref 4.40–5.90)
RDW: 15.6 % — ABNORMAL HIGH (ref 11.5–14.5)
WBC: 9.7 K/uL (ref 3.8–10.6)

## 2018-01-08 NOTE — Assessment & Plan Note (Addendum)
#   RECURRENT RECTAL CANCER STAGE IV:  On Lonsurf [started on 8/26]; but taking 1 pill BID; instead of 4 BID M-F. Awaiting to speak to Pharmacist.  Stable.  # Chronic kidney disease/ bilateral hydronephrosis; creatinine is fairly stable at 1.6.  Currently right-sided percutaneous nephrostomy tube; status post tubes exchange.  STABLE. Followed by Dr.Brandon.   # Back pain- secondary to pelvic mass/PCN; continue hydrocodone stable.  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  Stable.  # follow up on sep 23rd /labs- will speak to [employer]. Also asked pt to call us with pills

## 2018-01-08 NOTE — Progress Notes (Signed)
[place] OFFICE PROGRESS NOTE  Patient Care Team: Dion Body, MD as PCP - General (Family Medicine) Clent Jacks, RN as Registered Nurse  Cancer Staging No matching staging information was found for the patient.   Oncology History   # 2012- RECTO-SIGMOID-poorly diff STAGE III CA [s/p neo-adj chemo-RT- poor response]; APR [residual ypT3; 5/8 LN positiveDr.Smith/Dr.Pandit]  S/p FOLFOX   # SEP 2019- Pre-sacral fluid/mass- increasing ~ 5-6 cm [increased from dec 2017; also NEW retroperitoneal lymph nodes; inguinal adenopathy]; SEP 2018- left supraclavicular; mediastinal; right hilar-retroperitoneal; pelvic mass/node; bilateral inguinal adenopathy; s/p right Ingiunal LNBx - RECURRENT CA [necrotic;insuff tissue for F-One]  # OCT 8th 2018-FOLFIRI [first cycle 5FU alone]; OCT 22nd FOLFIRI; April 2019- significant PR  # MAY 2019- FOLFIRI q 3W [pt pref]; AUG 2019- CT Progression/ ingiunal LN; Aug 26th Lonsurf  ------------------------------  ---  OCT-NOV 2018-Sepsis x2 sec to UTI [s/p ABx;]  # s/p RT rectal [11/28-last treatment]  # Acute renal insuff [s/p bil PCN]; hematuria- sec to bladder invol [? Contra-indication to avastin]  # LEFT LE DVT [? May [facility]- Dr.Schneir]; Eliquis.   # CHF/COPD/CKD/ PN sec to [place]  # [facility] One-PDL-1/ TPS- 0% [2012-rectal specimen];12/26-Omniseq- K-RAS MUTATED;MSS; no targets** ---------------------------------------------   DIAGNOSIS: rectal cancer  STAGE:   IV  ; GOALS: palliative  CURRENT/MOST RECENT THERAPY: LONSURF [AUG  26th]      Rectal cancer ([place])      INTERVAL HISTORY:  [place] 67 y.o.  male pleasant patient above history of metastatic rectal cancer currently on LOnsurf-started approximately 2 weeks ago.  Patient complains of mild to moderate fatigue.  Denies any sores in the mouth.  Denies any diarrhea.  Interestingly patient has been taking 1 pill twice a day rather than 4 pills  twice a day as recommended as part of his chemotherapy course.  Chronic back pain.  Chronic stable penile discharge. Review of Systems  Constitutional: Positive for malaise/fatigue. Negative for chills, diaphoresis, fever and weight loss.  HENT: Negative for nosebleeds and sore throat.   Eyes: Negative for double vision.  Respiratory: Negative for cough, hemoptysis, sputum production, shortness of breath and wheezing.   Cardiovascular: Negative for chest pain, palpitations, orthopnea and leg swelling.  Gastrointestinal: Negative for abdominal pain, blood in stool, constipation, diarrhea, heartburn, melena, nausea and vomiting.  Genitourinary: Positive for hematuria. Negative for dysuria, frequency and urgency.  Musculoskeletal: Positive for back pain and joint pain.  Skin: Negative.  Negative for itching and rash.  Neurological: Negative for dizziness, tingling, focal weakness, weakness and headaches.  Endo/Heme/Allergies: Does not bruise/bleed easily.  Psychiatric/Behavioral: Negative for depression. The patient is not nervous/anxious and does not have insomnia.       PAST MEDICAL HISTORY :  Past Medical History:  Diagnosis Date  . A-fib ([place])   . Cancer [facility])    Colon  . CHF (congestive heart failure) ([place])   . Diabetes mellitus without complication ([place])   . Dyspnea   . Elevated lipids   . Hypertension   . PVD (peripheral vascular disease) ([place])     PAST SURGICAL HISTORY :   Past Surgical History:  Procedure Laterality Date  . COLON RESECTION     with colostomy  . COLONOSCOPY    . CYSTOSCOPY WITH STENT PLACEMENT Bilateral 01/18/2017   Procedure: CYSTOSCOPY WITH STENT PLACEMENT;  Surgeon: [name], MD;  Location: [facility] ORS;  Service: Urology;  Laterality: Bilateral;  . IR FLUORO GUIDE PORT INSERTION RIGHT  01/24/2017  .  IR NEPHROSTOGRAM LEFT THRU EXISTING ACCESS  09/18/2017  . IR NEPHROSTOGRAM RIGHT THRU EXISTING ACCESS  09/18/2017  . IR NEPHROSTOMY EXCHANGE LEFT   03/14/2017  . IR NEPHROSTOMY EXCHANGE LEFT  05/03/2017  . IR NEPHROSTOMY EXCHANGE LEFT  06/14/2017  . IR NEPHROSTOMY EXCHANGE LEFT  07/26/2017  . IR NEPHROSTOMY EXCHANGE LEFT  09/18/2017  . IR NEPHROSTOMY EXCHANGE LEFT  11/08/2017  . IR NEPHROSTOMY EXCHANGE LEFT  12/20/2017  . IR NEPHROSTOMY EXCHANGE RIGHT  03/14/2017  . IR NEPHROSTOMY EXCHANGE RIGHT  05/03/2017  . IR NEPHROSTOMY EXCHANGE RIGHT  06/14/2017  . IR NEPHROSTOMY EXCHANGE RIGHT  07/26/2017  . IR NEPHROSTOMY EXCHANGE RIGHT  10/04/2017  . IR NEPHROSTOMY EXCHANGE RIGHT  11/08/2017  . IR NEPHROSTOMY EXCHANGE RIGHT  12/20/2017  . IR NEPHROSTOMY PLACEMENT LEFT  01/19/2017  . IR NEPHROSTOMY PLACEMENT RIGHT  01/19/2017  . IVC FILTER REMOVAL N/A 08/28/2016   Procedure: IVC Filter Removal;  Surgeon: Katha Cabal, MD;  Location: [place] CV LAB;  Service: Cardiovascular;  Laterality: N/A;  . LOWER EXTREMITY VENOGRAPHY Left 12/18/2016   Procedure: Lower Extremity Venography;  Surgeon: Katha Cabal, MD;  Location: [place] CV LAB;  Service: Cardiovascular;  Laterality: Left;  . PERIPHERAL VASCULAR CATHETERIZATION Left 05/08/2016   Procedure: Lower Extremity Venography;  Surgeon: Katha Cabal, MD;  Location: [place] CV LAB;  Service: Cardiovascular;  Laterality: Left;    FAMILY HISTORY :   Family History  Problem Relation Age of Onset  . Diabetes Mother   . Stroke Mother   . Prostate cancer Brother   . Cancer Sister        Breast cancer  . Kidney cancer Neg Hx   . Bladder Cancer Neg Hx     SOCIAL HISTORY:   Social History   Tobacco Use  . Smoking status: Never Smoker  . Smokeless tobacco: Never Used  Substance Use Topics  . Alcohol use: No  . Drug use: No    ALLERGIES:  has No Known Allergies.  MEDICATIONS:  Current Outpatient Medications  Medication Sig Dispense Refill  . B-D ULTRAFINE III SHORT PEN 31G X 8 MM MISC     . Continuous Blood Gluc Receiver (FREESTYLE LIBRE READER) DEVI Use 1 Units as directed.  Check CBG's four times daily. Dx: E11.9    . Continuous Blood Gluc Sensor (FREESTYLE LIBRE SENSOR SYSTEM) MISC Use 1 Units as directed. Check CBG's four times daily. Dx: E11.9    . DIGOX 250 MCG tablet Take 0.25 mg by mouth daily.     [name] ELIQUIS 5 MG TABS tablet Take 5 mg by mouth every 12 (twelve) hours.  0  . feeding supplement, GLUCERNA SHAKE, ([place]) LIQD Follow manufacturer instructions    . furosemide (LASIX) 40 MG tablet Take 40 mg by mouth daily.   0  . glimepiride (AMARYL) 4 MG tablet Take 1 tablet by mouth daily.    . Lancets 30G MISC Use 1 Units as directed. Check CBG's up to twice daily. Dx: E11.9    . LANTUS SOLOSTAR 100 UNIT/ML Solostar Pen Inject 20 Units into the skin at bedtime.   0  . losartan (COZAAR) 100 MG tablet Take 100 mg by mouth daily.    . metoprolol tartrate (LOPRESSOR) 25 MG tablet Take 25 mg by mouth 2 (two) times daily.     . pravastatin (PRAVACHOL) 20 MG tablet take 21m by mouth daily at bedtime    . prochlorperazine (COMPAZINE) 10 MG tablet Take 1  tablet (10 mg total) by mouth every 6 (six) hours as needed for nausea or vomiting. 40 tablet 1  . trifluridine-tipiracil (LONSURF) 20-8.19 MG tablet Take 4 tablets (80 mg of trifluridine total) by mouth 2 (two) times daily after a meal. Take on days 1-5, 8-12. Repeat every 28day 80 tablet 3   No current facility-administered medications for this visit.     PHYSICAL EXAMINATION: ECOG PERFORMANCE STATUS: 1 - Symptomatic but completely ambulatory  BP 121/80 (BP Location: Left Arm, Patient Position: Sitting)   Pulse 71   Temp 97.9 F (36.6 C) (Tympanic)   Resp 16   There were no vitals filed for this visit.  Physical Exam  Constitutional: He is oriented to person, place, and time and well-developed, well-nourished, and in no distress.  HENT:  Head: Normocephalic and atraumatic.  Mouth/Throat: Oropharynx is clear and moist. No oropharyngeal exudate.  Eyes: Pupils are equal, round, and reactive to  light.  Neck: Normal range of motion. Neck supple.  Cardiovascular: Normal rate and regular rhythm.  Pulmonary/Chest: No respiratory distress. He has no wheezes.  Abdominal: Soft. Bowel sounds are normal. He exhibits no distension and no mass. There is no tenderness. There is no rebound and no guarding.  Musculoskeletal: Normal range of motion. He exhibits no edema or tenderness.  Neurological: He is alert and oriented to person, place, and time.  Skin: Skin is warm.  Psychiatric: Affect normal.       LABORATORY DATA:  I have reviewed the data as listed    Component Value Date/Time   NA 136 01/08/2018 1252   NA 139 12/17/2012 0608   K 4.0 01/08/2018 1252   K 3.8 12/17/2012 0608   CL 105 01/08/2018 1252   CL 108 (H) 12/17/2012 0608   CO2 21 (L) 01/08/2018 1252   CO2 26 12/17/2012 0608   GLUCOSE 188 (H) 01/08/2018 1252   GLUCOSE 217 (H) 12/17/2012 0608   BUN 31 (H) 01/08/2018 1252   BUN 21 01/01/2017 1621   BUN 15 12/17/2012 0608   CREATININE 1.57 (H) 01/08/2018 1252   CREATININE 0.84 12/17/2012 0608   CALCIUM 9.3 01/08/2018 1252   CALCIUM 8.2 (L) 12/17/2012 0608   PROT 7.4 01/08/2018 1252   PROT 5.8 (L) 12/17/2012 0608   ALBUMIN 3.4 (L) 01/08/2018 1252   ALBUMIN 2.9 (L) 12/17/2012 0608   AST 17 01/08/2018 1252   AST 16 12/17/2012 0608   ALT 13 01/08/2018 1252   ALT 23 12/17/2012 0608   ALKPHOS 77 01/08/2018 1252   ALKPHOS 78 12/17/2012 0608   BILITOT 0.5 01/08/2018 1252   BILITOT 0.7 12/17/2012 0608   GFRNONAA 44 (L) 01/08/2018 1252   GFRNONAA >60 12/17/2012 0608   GFRAA 51 (L) 01/08/2018 1252   GFRAA >60 12/17/2012 0608    No results found for: SPEP, UPEP  Lab Results  Component Value Date   WBC 9.7 01/08/2018   NEUTROABS 7.1 (H) 01/08/2018   HGB 11.7 (L) 01/08/2018   HCT 33.6 (L) 01/08/2018   MCV 85.4 01/08/2018   PLT 268 01/08/2018      Chemistry      Component Value Date/Time   NA 136 01/08/2018 1252   NA 139 12/17/2012 0608   K 4.0 01/08/2018  1252   K 3.8 12/17/2012 0608   CL 105 01/08/2018 1252   CL 108 (H) 12/17/2012 0608   CO2 21 (L) 01/08/2018 1252   CO2 26 12/17/2012 0608   BUN 31 (H) 01/08/2018 1252   BUN  21 01/01/2017 1621   BUN 15 12/17/2012 0608   CREATININE 1.57 (H) 01/08/2018 1252   CREATININE 0.84 12/17/2012 0608      Component Value Date/Time   CALCIUM 9.3 01/08/2018 1252   CALCIUM 8.2 (L) 12/17/2012 0608   ALKPHOS 77 01/08/2018 1252   ALKPHOS 78 12/17/2012 0608   AST 17 01/08/2018 1252   AST 16 12/17/2012 0608   ALT 13 01/08/2018 1252   ALT 23 12/17/2012 0608   BILITOT 0.5 01/08/2018 1252   BILITOT 0.7 12/17/2012 5974       RADIOGRAPHIC STUDIES: I have personally reviewed the radiological images as listed and agreed with the findings in the report. No results found.   ASSESSMENT & PLAN:  Rectal cancer ([place]) # RECURRENT RECTAL CANCER STAGE IV:  On Lonsurf [started on 8/26]; but taking 1 pill BID; instead of 4 BID M-F. Awaiting to speak to Pharmacist.  Stable.  # Chronic kidney disease/ bilateral hydronephrosis; creatinine is fairly stable at 1.6.  Currently right-sided percutaneous nephrostomy tube; status post tubes exchange.  STABLE. Followed by Dr.Brandon.   # Back pain- secondary to pelvic mass/PCN; continue hydrocodone stable.  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  Stable.  # follow up on sep 23rd /labs- will speak to [employer]. Also asked pt to call us with pills    No orders of the defined types were placed in this encounter.  All questions were answered. The patient knows to call the clinic with any problems, questions or concerns.      Cammie Sickle, MD 01/08/2018 1:38 PM

## 2018-01-09 LAB — CEA: CEA: 35.6 ng/mL — ABNORMAL HIGH (ref 0.0–4.7)

## 2018-01-10 ENCOUNTER — Other Ambulatory Visit: Payer: Self-pay | Admitting: Radiology

## 2018-01-10 DIAGNOSIS — N1330 Unspecified hydronephrosis: Secondary | ICD-10-CM

## 2018-01-10 DIAGNOSIS — N135 Crossing vessel and stricture of ureter without hydronephrosis: Secondary | ICD-10-CM

## 2018-01-14 ENCOUNTER — Encounter: Payer: Self-pay | Admitting: Urology

## 2018-01-15 ENCOUNTER — Telehealth: Payer: Self-pay | Admitting: Urology

## 2018-01-15 NOTE — Telephone Encounter (Signed)
Please call this patient to triage his issues Neurosurgeon message).  He likely needs to be seen if concern for infection in the next 1-2 days by me or Larene [place].   Hollice Espy, MD

## 2018-01-15 NOTE — Telephone Encounter (Signed)
Left mess to call

## 2018-01-16 ENCOUNTER — Ambulatory Visit (INDEPENDENT_AMBULATORY_CARE_PROVIDER_SITE_OTHER): Payer: Medicare Other | Admitting: Urology

## 2018-01-16 ENCOUNTER — Encounter: Payer: Self-pay | Admitting: Urology

## 2018-01-16 VITALS — BP 112/67 | HR 80 | Ht 70.0 in | Wt 247.0 lb

## 2018-01-16 DIAGNOSIS — N135 Crossing vessel and stricture of ureter without hydronephrosis: Secondary | ICD-10-CM

## 2018-01-16 DIAGNOSIS — R369 Urethral discharge, unspecified: Secondary | ICD-10-CM

## 2018-01-16 LAB — BLADDER SCAN AMB NON-IMAGING

## 2018-01-16 MED ORDER — SULFAMETHOXAZOLE-TRIMETHOPRIM 800-160 MG PO TABS: 1.0000 | ORAL_TABLET | Freq: Two times a day (BID) | ORAL | 0 refills | Status: DC

## 2018-01-16 NOTE — Progress Notes (Signed)
01/16/2018 1:51 PM   Donald Townsend [birth year] [id number]  Referring provider: Dion Body, MD [place] [facility] [place], [place] [address]  Chief Complaint  Patient presents with  . Hydronephrosis    HPI: 67 year old male with stage IV metastatic colorectal cancer with mass-effect involving the bladder.  He has bilateral nephrostomy tubes.  With his bilateral nephrostomy tubes he has been extremely oliguric but usually passed a small amount of mucus discharge.  Approximately 3 days ago he had onset of constant urge to void without producing any output.  He noted swelling in the suprapubic region.  Last night he began to have increased bloody/purulent discharge with improvement in his symptoms.  He denies fever or chills.   PMH: Past Medical History:  Diagnosis Date  . A-fib ([place])   . Cancer [facility])    Colon  . CHF (congestive heart failure) ([place])   . Diabetes mellitus without complication ([place])   . Dyspnea   . Elevated lipids   . Hypertension   . PVD (peripheral vascular disease) [facility])     Surgical History: Past Surgical History:  Procedure Laterality Date  . COLON RESECTION     with colostomy  . COLONOSCOPY    . CYSTOSCOPY WITH STENT PLACEMENT Bilateral 01/18/2017   Procedure: CYSTOSCOPY WITH STENT PLACEMENT;  Surgeon: [name], MD;  Location: [facility] ORS;  Service: Urology;  Laterality: Bilateral;  . IR FLUORO GUIDE PORT INSERTION RIGHT  01/24/2017  . IR NEPHROSTOGRAM LEFT THRU EXISTING ACCESS  09/18/2017  . IR NEPHROSTOGRAM RIGHT THRU EXISTING ACCESS  09/18/2017  . IR NEPHROSTOMY EXCHANGE LEFT  03/14/2017  . IR NEPHROSTOMY EXCHANGE LEFT  05/03/2017  . IR NEPHROSTOMY EXCHANGE LEFT  06/14/2017  . IR NEPHROSTOMY EXCHANGE LEFT  07/26/2017  . IR NEPHROSTOMY EXCHANGE LEFT  09/18/2017  . IR NEPHROSTOMY EXCHANGE LEFT  11/08/2017  . IR NEPHROSTOMY EXCHANGE LEFT  12/20/2017  . IR NEPHROSTOMY EXCHANGE RIGHT  03/14/2017  . IR NEPHROSTOMY  EXCHANGE RIGHT  05/03/2017  . IR NEPHROSTOMY EXCHANGE RIGHT  06/14/2017  . IR NEPHROSTOMY EXCHANGE RIGHT  07/26/2017  . IR NEPHROSTOMY EXCHANGE RIGHT  10/04/2017  . IR NEPHROSTOMY EXCHANGE RIGHT  11/08/2017  . IR NEPHROSTOMY EXCHANGE RIGHT  12/20/2017  . IR NEPHROSTOMY PLACEMENT LEFT  01/19/2017  . IR NEPHROSTOMY PLACEMENT RIGHT  01/19/2017  . IVC FILTER REMOVAL N/A 08/28/2016   Procedure: IVC Filter Removal;  Surgeon: Katha Cabal, MD;  Location: [place] CV LAB;  Service: Cardiovascular;  Laterality: N/A;  . LOWER EXTREMITY VENOGRAPHY Left 12/18/2016   Procedure: Lower Extremity Venography;  Surgeon: Katha Cabal, MD;  Location: [place] CV LAB;  Service: Cardiovascular;  Laterality: Left;  . PERIPHERAL VASCULAR CATHETERIZATION Left 05/08/2016   Procedure: Lower Extremity Venography;  Surgeon: Katha Cabal, MD;  Location: [place] CV LAB;  Service: Cardiovascular;  Laterality: Left;    Home Medications:  Allergies as of 01/16/2018   No Known Allergies     Medication List        Accurate as of 01/16/18  1:51 PM. Always use your most recent med list.          B-D ULTRAFINE III SHORT PEN 31G X 8 MM Misc Generic drug:  Insulin Pen Needle   DIGOX 0.25 MG tablet Generic drug:  digoxin Take 0.25 mg by mouth daily.   ELIQUIS 5 MG Tabs tablet Generic drug:  apixaban Take 5 mg by mouth every 12 (twelve) hours.  feeding supplement (GLUCERNA SHAKE) Liqd Follow manufacturer instructions   FREESTYLE LIBRE READER Devi Use 1 Units as directed. Check CBG's four times daily. Dx: E11.9   FREESTYLE LIBRE SENSOR SYSTEM Misc Use 1 Units as directed. Check CBG's four times daily. Dx: E11.9   furosemide 40 MG tablet Commonly known as:  LASIX Take 40 mg by mouth daily.   glimepiride 4 MG tablet Commonly known as:  AMARYL Take 1 tablet by mouth daily.   Lancets 30G Misc Use 1 Units as directed. Check CBG's up to twice daily. Dx: E11.9   LANTUS SOLOSTAR 100 UNIT/ML  Solostar Pen Generic drug:  Insulin Glargine Inject 20 Units into the skin at bedtime.   losartan 100 MG tablet Commonly known as:  COZAAR Take 100 mg by mouth daily.   metoprolol tartrate 25 MG tablet Commonly known as:  LOPRESSOR Take 25 mg by mouth 2 (two) times daily.   pravastatin 20 MG tablet Commonly known as:  PRAVACHOL take 20mg  by mouth daily at bedtime   prochlorperazine 10 MG tablet Commonly known as:  COMPAZINE Take 1 tablet (10 mg total) by mouth every 6 (six) hours as needed for nausea or vomiting.   trifluridine-tipiracil 20-8.19 MG tablet Commonly known as:  LONSURF Take 4 tablets (80 mg of trifluridine total) by mouth 2 (two) times daily after a meal. Take on days 1-5, 8-12. Repeat every 28day       Allergies: No Known Allergies  Family History: Family History  Problem Relation Age of Onset  . Diabetes Mother   . Stroke Mother   . Prostate cancer Brother   . Cancer Sister        Breast cancer  . Kidney cancer Neg Hx   . Bladder Cancer Neg Hx     Social History:  reports that he has never smoked. He has never used smokeless tobacco. He reports that he does not drink alcohol or use drugs.  ROS: UROLOGY Frequent Urination?: No Hard to postpone urination?: No Burning/pain with urination?: No Get up at night to urinate?: No Leakage of urine?: No Urine stream starts and stops?: No Trouble starting stream?: No Do you have to strain to urinate?: No Blood in urine?: Yes Urinary tract infection?: No Sexually transmitted disease?: No Injury to kidneys or bladder?: No Painful intercourse?: No Weak stream?: No Erection problems?: No Penile pain?: No  Gastrointestinal Nausea?: No Vomiting?: No Indigestion/heartburn?: No Diarrhea?: No Constipation?: No  Constitutional Fever: No Night sweats?: No Weight loss?: No Fatigue?: No  Skin Skin rash/lesions?: No Itching?: No  Eyes Blurred vision?: No Double vision?:  No  Ears/Nose/Throat Sore throat?: No Sinus problems?: No  Hematologic/Lymphatic Swollen glands?: No Easy bruising?: No  Cardiovascular Leg swelling?: No Chest pain?: No  Respiratory Cough?: No Shortness of breath?: No  Endocrine Excessive thirst?: No  Musculoskeletal Back pain?: No Joint pain?: No  Neurological Headaches?: No Dizziness?: No  Psychologic Depression?: No Anxiety?: No  Physical Exam: BP 112/67   Pulse 80   Ht 5\' 10"  (1.778 m)   Wt 247 lb (112 kg)   BMI 35.44 kg/m   Constitutional:  Alert and oriented, No acute distress. HEENT: [place] AT, moist mucus membranes.  Trachea midline, no masses. Cardiovascular: No clubbing, cyanosis, or edema. Respiratory: Normal respiratory effort, no increased work of breathing. GI: Abdomen is soft, nontender, nondistended, no abdominal masses GU: No CVA tenderness.  Penis uncircumcised with mild to moderate penile/scrotal edema.  The suprapubic swelling he refers to is more in  line with soft tissue edema.  Nephrostomy urine is clear Lymph: No cervical or inguinal lymphadenopathy. Skin: No rashes, bruises or suspicious lesions. Neurologic: Grossly intact, no focal deficits, moving all 4 extremities. Psychiatric: Normal mood and affect.   Assessment & Plan:   A bladder scan was performed with an estimated bladder volume of 0 mL.  He was able to produce some of this discharge which was diluted with saline and was sent for culture.  We will start empiric antibiotics.  He was instructed to call for worsening symptoms or development of fever.   Abbie Sons, [place] [address], [place], [place] [address] [phone]

## 2018-01-17 ENCOUNTER — Telehealth: Payer: Self-pay | Admitting: *Deleted

## 2018-01-17 NOTE — Telephone Encounter (Signed)
Received call from patient's daughter Nira Conn regarding her dad's condition. He was seen yesterday in urology by Dr. Bernardo Heater for fever and swelling around penis and scrotum with puss and bloody discharge from penis. He was started on Bactrim DS last night and fever is down this morning. Because of his fever, she held his Lonsurf last night. He took his Lonsurf this morning and will take it tonight. Tonight's dose would have been the last dose for this round. She wants to know if he should make up the skipped dose tomorrow morning or just take tonight's dose, then stop. Please advise.   dhs

## 2018-01-17 NOTE — Telephone Encounter (Signed)
Phone call returned to daughter Nira Conn. Md would like patient to hold tonight's dose of Lonsurf  Pt has an apt on Monday 9/23. Pt will keep this apt and will be reevaluated by md at that time. Daughter verbalized understanding of the plan of care.

## 2018-01-19 LAB — CULTURE, URINE COMPREHENSIVE

## 2018-01-20 ENCOUNTER — Encounter: Payer: Self-pay | Admitting: Urology

## 2018-01-20 ENCOUNTER — Telehealth: Payer: Self-pay

## 2018-01-20 ENCOUNTER — Inpatient Hospital Stay (HOSPITAL_BASED_OUTPATIENT_CLINIC_OR_DEPARTMENT_OTHER): Payer: Medicare Other | Admitting: Internal Medicine

## 2018-01-20 ENCOUNTER — Ambulatory Visit (INDEPENDENT_AMBULATORY_CARE_PROVIDER_SITE_OTHER): Payer: Medicare Other | Admitting: Urology

## 2018-01-20 ENCOUNTER — Inpatient Hospital Stay: Payer: Medicare Other

## 2018-01-20 VITALS — BP 115/76 | Ht 70.0 in | Wt 245.4 lb

## 2018-01-20 VITALS — BP 125/81 | HR 81 | Temp 97.3°F | Resp 16 | Wt 246.0 lb

## 2018-01-20 DIAGNOSIS — N1339 Other hydronephrosis: Secondary | ICD-10-CM | POA: Diagnosis not present

## 2018-01-20 DIAGNOSIS — N183 Chronic kidney disease, stage 3 (moderate): Secondary | ICD-10-CM | POA: Diagnosis not present

## 2018-01-20 DIAGNOSIS — Z7901 Long term (current) use of anticoagulants: Secondary | ICD-10-CM

## 2018-01-20 DIAGNOSIS — I509 Heart failure, unspecified: Secondary | ICD-10-CM | POA: Diagnosis not present

## 2018-01-20 DIAGNOSIS — R5383 Other fatigue: Secondary | ICD-10-CM

## 2018-01-20 DIAGNOSIS — N390 Urinary tract infection, site not specified: Secondary | ICD-10-CM

## 2018-01-20 DIAGNOSIS — Z79899 Other long term (current) drug therapy: Secondary | ICD-10-CM

## 2018-01-20 DIAGNOSIS — G8929 Other chronic pain: Secondary | ICD-10-CM

## 2018-01-20 DIAGNOSIS — E1122 Type 2 diabetes mellitus with diabetic chronic kidney disease: Secondary | ICD-10-CM | POA: Diagnosis not present

## 2018-01-20 DIAGNOSIS — N5089 Other specified disorders of the male genital organs: Secondary | ICD-10-CM | POA: Diagnosis not present

## 2018-01-20 DIAGNOSIS — R600 Localized edema: Secondary | ICD-10-CM | POA: Diagnosis not present

## 2018-01-20 DIAGNOSIS — R319 Hematuria, unspecified: Secondary | ICD-10-CM

## 2018-01-20 DIAGNOSIS — N135 Crossing vessel and stricture of ureter without hydronephrosis: Secondary | ICD-10-CM | POA: Diagnosis not present

## 2018-01-20 DIAGNOSIS — I130 Hypertensive heart and chronic kidney disease with heart failure and stage 1 through stage 4 chronic kidney disease, or unspecified chronic kidney disease: Secondary | ICD-10-CM | POA: Diagnosis not present

## 2018-01-20 DIAGNOSIS — C20 Malignant neoplasm of rectum: Secondary | ICD-10-CM

## 2018-01-20 DIAGNOSIS — Z7984 Long term (current) use of oral hypoglycemic drugs: Secondary | ICD-10-CM

## 2018-01-20 DIAGNOSIS — I4891 Unspecified atrial fibrillation: Secondary | ICD-10-CM

## 2018-01-20 DIAGNOSIS — I10 Essential (primary) hypertension: Secondary | ICD-10-CM | POA: Diagnosis not present

## 2018-01-20 DIAGNOSIS — N189 Chronic kidney disease, unspecified: Secondary | ICD-10-CM | POA: Diagnosis not present

## 2018-01-20 DIAGNOSIS — Z794 Long term (current) use of insulin: Secondary | ICD-10-CM | POA: Diagnosis not present

## 2018-01-20 DIAGNOSIS — Z86718 Personal history of other venous thrombosis and embolism: Secondary | ICD-10-CM

## 2018-01-20 DIAGNOSIS — M549 Dorsalgia, unspecified: Secondary | ICD-10-CM

## 2018-01-20 DIAGNOSIS — N1330 Unspecified hydronephrosis: Secondary | ICD-10-CM | POA: Diagnosis not present

## 2018-01-20 LAB — CBC WITH DIFFERENTIAL/PLATELET
Basophils Absolute: 0.1 10*3/uL (ref 0–0.1)
Basophils Relative: 1 %
Eosinophils Absolute: 0.2 10*3/uL (ref 0–0.7)
Eosinophils Relative: 3 %
HEMATOCRIT: 30.7 % — AB (ref 40.0–52.0)
HEMOGLOBIN: 10.6 g/dL — AB (ref 13.0–18.0)
LYMPHS ABS: 0.7 10*3/uL — AB (ref 1.0–3.6)
LYMPHS PCT: 10 %
MCH: 29.3 pg (ref 26.0–34.0)
MCHC: 34.4 g/dL (ref 32.0–36.0)
MCV: 85.2 fL (ref 80.0–100.0)
MONOS PCT: 5 %
Monocytes Absolute: 0.3 10*3/uL (ref 0.2–1.0)
NEUTROS ABS: 6.3 10*3/uL (ref 1.4–6.5)
Neutrophils Relative %: 81 %
Platelets: 304 10*3/uL (ref 150–440)
RBC: 3.61 MIL/uL — ABNORMAL LOW (ref 4.40–5.90)
RDW: 15.2 % — ABNORMAL HIGH (ref 11.5–14.5)
WBC: 7.6 10*3/uL (ref 3.8–10.6)

## 2018-01-20 LAB — COMPREHENSIVE METABOLIC PANEL
ALT: 30 U/L (ref 0–44)
AST: 24 U/L (ref 15–41)
Albumin: 3 g/dL — ABNORMAL LOW (ref 3.5–5.0)
Alkaline Phosphatase: 122 U/L (ref 38–126)
Anion gap: 10 (ref 5–15)
BUN: 35 mg/dL — ABNORMAL HIGH (ref 8–23)
CHLORIDE: 102 mmol/L (ref 98–111)
CO2: 20 mmol/L — AB (ref 22–32)
CREATININE: 1.98 mg/dL — AB (ref 0.61–1.24)
Calcium: 8.8 mg/dL — ABNORMAL LOW (ref 8.9–10.3)
GFR calc Af Amer: 39 mL/min — ABNORMAL LOW (ref >60)
GFR calc non Af Amer: 33 mL/min — ABNORMAL LOW (ref >60)
Glucose, Bld: 280 mg/dL — ABNORMAL HIGH (ref 70–99)
POTASSIUM: 4.6 mmol/L (ref 3.5–5.1)
SODIUM: 132 mmol/L — AB (ref 135–145)
Total Bilirubin: 0.4 mg/dL (ref 0.3–1.2)
Total Protein: 7.1 g/dL (ref 6.5–8.1)

## 2018-01-20 LAB — BLADDER SCAN AMB NON-IMAGING: SCAN RESULT: 0

## 2018-01-20 NOTE — Progress Notes (Signed)
[facility] OFFICE PROGRESS NOTE  Patient Care Team: Dion Body, MD as PCP - General (Family Medicine) Clent Jacks, RN as Registered Nurse  Cancer Staging No matching staging information was found for the patient.   Oncology History   # 2012- RECTO-SIGMOID-poorly diff STAGE III CA [s/p neo-adj chemo-RT- poor response]; APR [residual ypT3; 5/8 LN positiveDr.Smith/Dr.Pandit]  S/p FOLFOX   # SEP 2019- Pre-sacral fluid/mass- increasing ~ 5-6 cm [increased from dec 2017; also NEW retroperitoneal lymph nodes; inguinal adenopathy]; SEP 2018- left supraclavicular; mediastinal; right hilar-retroperitoneal; pelvic mass/node; bilateral inguinal adenopathy; s/p right Ingiunal LNBx - RECURRENT CA [necrotic;insuff tissue for F-One]  # OCT 8th 2018-FOLFIRI [first cycle 5FU alone]; OCT 22nd FOLFIRI; April 2019- significant PR  # MAY 2019- FOLFIRI q 3W [pt pref]; AUG 2019- CT Progression/ ingiunal LN; Aug 26th Lonsurf  ------------------------------  ---  OCT-NOV 2018-Sepsis x2 sec to UTI [s/p ABx;]  # s/p RT rectal [11/28-last treatment]  # Acute renal insuff [s/p bil PCN]; hematuria- sec to bladder invol [? Contra-indication to avastin]  # LEFT LE DVT [? [facility]- Dr.Schneir]; Eliquis.   # CHF/COPD/CKD/ PN sec to [place]  # [facility] One-PDL-1/ TPS- 0% [2012-rectal specimen];12/26-Omniseq- K-RAS MUTATED;MSS; no targets** ---------------------------------------------   DIAGNOSIS: rectal cancer  STAGE:   IV  ; GOALS: palliative  CURRENT/MOST RECENT THERAPY: LONSURF [AUG  26th]      Rectal cancer ([place])      INTERVAL HISTORY:  Donald Townsend 67 y.o.  male pleasant patient above history of metastatic rectal cancer currently on Lonsurf-status post cycle #1 is here for follow-up.  Patient interim was evaluated by urology for penile discharge/bloody discharge.  Urine culture positive for MRSA; patient on Bactrim.  Patient also noted to have swelling of  the scrotum/and also in the suprapubic area.  He continues to have bilateral nephrostomy tubes.  States he is making good urine especially in the right side.  He does complain of significant fatigue.  Continues to complain of chronic back pain.  Denies any diarrhea.  Review of Systems  Constitutional: Positive for malaise/fatigue. Negative for chills, diaphoresis, fever and weight loss.  HENT: Negative for nosebleeds and sore throat.   Eyes: Negative for double vision.  Respiratory: Negative for cough, hemoptysis, sputum production, shortness of breath and wheezing.   Cardiovascular: Negative for chest pain, palpitations, orthopnea and leg swelling.  Gastrointestinal: Negative for abdominal pain, blood in stool, constipation, diarrhea, heartburn, melena, nausea and vomiting.  Genitourinary: Positive for hematuria. Negative for dysuria, frequency and urgency.  Musculoskeletal: Positive for back pain and joint pain.  Skin: Negative.  Negative for itching and rash.  Neurological: Negative for dizziness, tingling, focal weakness, weakness and headaches.  Endo/Heme/Allergies: Does not bruise/bleed easily.  Psychiatric/Behavioral: Negative for depression. The patient is not nervous/anxious and does not have insomnia.       PAST MEDICAL HISTORY :  Past Medical History:  Diagnosis Date  . A-fib ([place])   . Cancer [facility])    Colon  . CHF (congestive heart failure) ([place])   . Diabetes mellitus without complication ([place])   . Dyspnea   . Elevated lipids   . Hypertension   . PVD (peripheral vascular disease) ([place])     PAST SURGICAL HISTORY :   Past Surgical History:  Procedure Laterality Date  . COLON RESECTION     with colostomy  . COLONOSCOPY    . CYSTOSCOPY WITH STENT PLACEMENT Bilateral 01/18/2017   Procedure: CYSTOSCOPY WITH STENT PLACEMENT;  Surgeon:  [name], MD;  Location: [facility] ORS;  Service: Urology;  Laterality: Bilateral;  . IR FLUORO GUIDE PORT INSERTION RIGHT  01/24/2017   . IR NEPHROSTOGRAM LEFT THRU EXISTING ACCESS  09/18/2017  . IR NEPHROSTOGRAM RIGHT THRU EXISTING ACCESS  09/18/2017  . IR NEPHROSTOMY EXCHANGE LEFT  03/14/2017  . IR NEPHROSTOMY EXCHANGE LEFT  05/03/2017  . IR NEPHROSTOMY EXCHANGE LEFT  06/14/2017  . IR NEPHROSTOMY EXCHANGE LEFT  07/26/2017  . IR NEPHROSTOMY EXCHANGE LEFT  09/18/2017  . IR NEPHROSTOMY EXCHANGE LEFT  11/08/2017  . IR NEPHROSTOMY EXCHANGE LEFT  12/20/2017  . IR NEPHROSTOMY EXCHANGE RIGHT  03/14/2017  . IR NEPHROSTOMY EXCHANGE RIGHT  05/03/2017  . IR NEPHROSTOMY EXCHANGE RIGHT  06/14/2017  . IR NEPHROSTOMY EXCHANGE RIGHT  07/26/2017  . IR NEPHROSTOMY EXCHANGE RIGHT  10/04/2017  . IR NEPHROSTOMY EXCHANGE RIGHT  11/08/2017  . IR NEPHROSTOMY EXCHANGE RIGHT  12/20/2017  . IR NEPHROSTOMY PLACEMENT LEFT  01/19/2017  . IR NEPHROSTOMY PLACEMENT RIGHT  01/19/2017  . IVC FILTER REMOVAL N/A 08/28/2016   Procedure: IVC Filter Removal;  Surgeon: Katha Cabal, MD;  Location: [place] [facility] LAB;  Service: Cardiovascular;  Laterality: N/A;  . LOWER EXTREMITY VENOGRAPHY Left 12/18/2016   Procedure: Lower Extremity Venography;  Surgeon: Katha Cabal, MD;  Location: [place] CV LAB;  Service: Cardiovascular;  Laterality: Left;  . PERIPHERAL VASCULAR CATHETERIZATION Left 05/08/2016   Procedure: Lower Extremity Venography;  Surgeon: Katha Cabal, MD;  Location: [place] [facility];  Service: Cardiovascular;  Laterality: Left;    FAMILY HISTORY :   Family History  Problem Relation Age of Onset  . Diabetes Mother   . Stroke Mother   . Prostate cancer Brother   . Cancer Sister        Breast cancer  . Kidney cancer Neg Hx   . Bladder Cancer Neg Hx     SOCIAL HISTORY:   Social History   Tobacco Use  . Smoking status: Never Smoker  . Smokeless tobacco: Never Used  Substance Use Topics  . Alcohol use: No  . Drug use: No    ALLERGIES:  has No Known Allergies.  MEDICATIONS:  Current Outpatient Medications  Medication Sig  Dispense Refill  . B-D ULTRAFINE III SHORT PEN 31G X 8 MM MISC     . Continuous Blood Gluc Receiver (FREESTYLE LIBRE READER) DEVI Use 1 Units as directed. Check CBG's four times daily. Dx: E11.9    . Continuous Blood Gluc Sensor (FREESTYLE LIBRE SENSOR SYSTEM) MISC Use 1 Units as directed. Check CBG's four times daily. Dx: E11.9    . DIGOX 250 MCG tablet Take 0.25 mg by mouth daily.     [name] ELIQUIS 5 MG TABS tablet Take 5 mg by mouth every 12 (twelve) hours.  0  . furosemide (LASIX) 40 MG tablet Take 40 mg by mouth daily.   0  . glimepiride (AMARYL) 4 MG tablet Take 1 tablet by mouth daily.    [name] LANTUS SOLOSTAR 100 UNIT/ML Solostar Pen Inject 20 Units into the skin at bedtime.   0  . losartan (COZAAR) 100 MG tablet Take 100 mg by mouth daily.    . metoprolol tartrate (LOPRESSOR) 25 MG tablet Take 25 mg by mouth 2 (two) times daily.     . pravastatin (PRAVACHOL) 20 MG tablet take 64m by mouth daily at bedtime    . sulfamethoxazole-trimethoprim (BACTRIM DS,SEPTRA DS) 800-160 MG tablet Take 1 tablet by mouth every 12 (twelve)  hours. 14 tablet 0  . trifluridine-tipiracil (LONSURF) 20-8.19 MG tablet Take 4 tablets (80 mg of trifluridine total) by mouth 2 (two) times daily after a meal. Take on days 1-5, 8-12. Repeat every 28day (Patient not taking: Reported on 01/20/2018) 80 tablet 3   No current facility-administered medications for this visit.     PHYSICAL EXAMINATION: ECOG PERFORMANCE STATUS: 1 - Symptomatic but completely ambulatory  BP 125/81 (BP Location: Left Arm, Patient Position: Sitting)   Pulse 81   Temp (!) 97.3 F (36.3 C) (Tympanic)   Resp 16   Wt 246 lb (111.6 kg)   BMI 35.30 kg/m   Filed Weights   01/20/18 1314  Weight: 246 lb (111.6 kg)    Physical Exam  Constitutional: He is oriented to person, place, and time and well-developed, well-nourished, and in no distress.  Is alone.  Is walking himself.  HENT:  Head: Normocephalic and atraumatic.  Mouth/Throat: Oropharynx  is clear and moist. No oropharyngeal exudate.  Eyes: Pupils are equal, round, and reactive to light.  Neck: Normal range of motion. Neck supple.  Cardiovascular: Normal rate and regular rhythm.  Pulmonary/Chest: No respiratory distress. He has no wheezes.  Decreased breath sounds at the bases bilaterally.  Abdominal: Soft. Bowel sounds are normal. He exhibits no distension and no mass. There is no tenderness. There is no rebound and no guarding.  Bilateral nephrostomy tubes.  Scrotal/penile/suprapubic swelling noted.  Musculoskeletal: Normal range of motion. He exhibits no edema or tenderness.  Neurological: He is alert and oriented to person, place, and time.  Skin: Skin is warm.  Psychiatric: Affect normal.       LABORATORY DATA:  I have reviewed the data as listed    Component Value Date/Time   NA 132 (L) 01/20/2018 1252   NA 139 12/17/2012 0608   K 4.6 01/20/2018 1252   K 3.8 12/17/2012 0608   CL 102 01/20/2018 1252   CL 108 (H) 12/17/2012 0608   CO2 20 (L) 01/20/2018 1252   CO2 26 12/17/2012 0608   GLUCOSE 280 (H) 01/20/2018 1252   GLUCOSE 217 (H) 12/17/2012 0608   BUN 35 (H) 01/20/2018 1252   BUN 21 01/01/2017 1621   BUN 15 12/17/2012 0608   CREATININE 1.98 (H) 01/20/2018 1252   CREATININE 0.84 12/17/2012 0608   CALCIUM 8.8 (L) 01/20/2018 1252   CALCIUM 8.2 (L) 12/17/2012 0608   PROT 7.1 01/20/2018 1252   PROT 5.8 (L) 12/17/2012 0608   ALBUMIN 3.0 (L) 01/20/2018 1252   ALBUMIN 2.9 (L) 12/17/2012 0608   AST 24 01/20/2018 1252   AST 16 12/17/2012 0608   ALT 30 01/20/2018 1252   ALT 23 12/17/2012 0608   ALKPHOS 122 01/20/2018 1252   ALKPHOS 78 12/17/2012 0608   BILITOT 0.4 01/20/2018 1252   BILITOT 0.7 12/17/2012 0608   GFRNONAA 33 (L) 01/20/2018 1252   GFRNONAA >60 12/17/2012 0608   GFRAA 39 (L) 01/20/2018 1252   GFRAA >60 12/17/2012 0608    No results found for: SPEP, UPEP  Lab Results  Component Value Date   WBC 7.6 01/20/2018   NEUTROABS 6.3  01/20/2018   HGB 10.6 (L) 01/20/2018   HCT 30.7 (L) 01/20/2018   MCV 85.2 01/20/2018   PLT 304 01/20/2018      Chemistry      Component Value Date/Time   NA 132 (L) 01/20/2018 1252   NA 139 12/17/2012 0608   K 4.6 01/20/2018 1252   K 3.8 12/17/2012 0240  CL 102 01/20/2018 1252   CL 108 (H) 12/17/2012 0608   CO2 20 (L) 01/20/2018 1252   CO2 26 12/17/2012 0608   BUN 35 (H) 01/20/2018 1252   BUN 21 01/01/2017 1621   BUN 15 12/17/2012 0608   CREATININE 1.98 (H) 01/20/2018 1252   CREATININE 0.84 12/17/2012 0608      Component Value Date/Time   CALCIUM 8.8 (L) 01/20/2018 1252   CALCIUM 8.2 (L) 12/17/2012 0608   ALKPHOS 122 01/20/2018 1252   ALKPHOS 78 12/17/2012 0608   AST 24 01/20/2018 1252   AST 16 12/17/2012 0608   ALT 30 01/20/2018 1252   ALT 23 12/17/2012 0608   BILITOT 0.4 01/20/2018 1252   BILITOT 0.7 12/17/2012 0608       RADIOGRAPHIC STUDIES: I have personally reviewed the radiological images as listed and agreed with the findings in the report. No results found.   ASSESSMENT & PLAN:  Rectal cancer ([place]) # RECURRENT RECTAL CANCER STAGE IV:  On 3 pills BID [Lonsurf finished 9/20]; feeling poorly [see below]; awaiting on CEA from today.  #Given poor tolerance [given extreme fatigue]; recommend spacing out the cycles to 3 weeks.   # Swelling in the suprapubic area/ scrotal swelling; awaiting evaluation Dr.Brandon this afternoon.   # Chronic kidney disease/ bilateral hydronephrosis; creatinine is fairly stable at 1.9  Currently right-sided percutaneous nephrostomy tube; status post tubes exchange.  STABLE.  #Question recent UTI/MRSA currently on Bactrim; as per urology.  # Back pain- secondary to pelvic mass/PCN; continue hydrocodone stable.  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  Stable.  # Follow up in 3 weeks [poor tolerance]/labs-CBCB/CMP/CEA; will start Lonsurf at that time.    Orders Placed This Encounter   Procedures  . CBC with Differential    Standing Status:   Future    Standing Expiration Date:   01/21/2019  . Comprehensive metabolic panel    Standing Status:   Future    Standing Expiration Date:   01/21/2019  . CEA    Standing Status:   Future    Standing Expiration Date:   01/21/2019   All questions were answered. The patient knows to call the clinic with any problems, questions or concerns.      Cammie Sickle, MD 01/20/2018 10:52 PM

## 2018-01-20 NOTE — Telephone Encounter (Signed)
Pt informed via mychart

## 2018-01-20 NOTE — Telephone Encounter (Signed)
I spoke with Donald Townsend today in regards to his sx's. He was currently being seen by his Nephrologist. He states they addressed some of his concerns at that appointment. Donald Townsend was informed per Dr. Bernardo Heater, his infection was positive and the abx prescribed should do the job. Donald Townsend states he has not been running a temperature since. I spoke with Nira Conn, Donald Townsend provided phone for her to schedule. Donald Townsend does not wish to come in today, will wait and see how he is feeling since starting abx.

## 2018-01-20 NOTE — Progress Notes (Signed)
01/20/2018 8:22 AM   Ted Mcalpine 07-Mar-1951 [id number]  Referring provider: Dion Body, MD [facility] [place],  [id number]  Chief Complaint  Patient presents with  . Follow-up    HPI: Patient is a 67 year old Caucasian male with stage IV metastatic colorectal cancer with mass-effect involving the bladder with bilateral nephrostomy tubes who presents to the office for scrotal swelling with his daughter, Nira Conn.   He was last seen in our office on 01/16/2018 for the complaint of the onset of urinary urgency, swelling in the suprapubic region and a bloody/purulent discharge.  A bladder scan that day noted 0 mL and the swelling in the suprapubic region was found to be soft tissue edema.  His penile/bladder discharge was collected and sent for culture.  He was placed on Septra DS and the urine culture was positive for MRSA which was sensitive to the Septra.    He presents today after his visit to the Cancer center for suprapubic/penile/scrotal swelling.   He states his urinary symptoms are improving with the Septra.  He has not had any injury to the scrotal/penile area.  He is having more malaise/fatigue, but he attributes this to his chemotherapy.   The chemo has been spaced out to every 3 weeks in hopes of better toleration.    He is wearing pads daily as he has nocturia and incontinence.  He states his scrotal area is not painful.    His daughter states that she has noticed more leakage from the right nephrostomy tube as she is finding the dressing wet with urine.  Patient states he sleeps on his right side.  He is due for bilateral nephrostomy exchange on the 18th of October.    Urine is clear yellow in the nephrostomy bags and he states he is having good output.    PMH: Past Medical History:  Diagnosis Date  . A-fib ([place])   . Cancer [facility])    Colon  . CHF (congestive heart failure) ([place])   . Diabetes mellitus without complication  ([place])   . Dyspnea   . Elevated lipids   . Hypertension   . PVD (peripheral vascular disease) [facility])     Surgical History: Past Surgical History:  Procedure Laterality Date  . COLON RESECTION     with colostomy  . COLONOSCOPY    . CYSTOSCOPY WITH STENT PLACEMENT Bilateral 01/18/2017   Procedure: CYSTOSCOPY WITH STENT PLACEMENT;  Surgeon: [name], MD;  Location: [facility] ORS;  Service: Urology;  Laterality: Bilateral;  . IR FLUORO GUIDE PORT INSERTION RIGHT  01/24/2017  . IR NEPHROSTOGRAM LEFT THRU EXISTING ACCESS  09/18/2017  . IR NEPHROSTOGRAM RIGHT THRU EXISTING ACCESS  09/18/2017  . IR NEPHROSTOMY EXCHANGE LEFT  03/14/2017  . IR NEPHROSTOMY EXCHANGE LEFT  05/03/2017  . IR NEPHROSTOMY EXCHANGE LEFT  06/14/2017  . IR NEPHROSTOMY EXCHANGE LEFT  07/26/2017  . IR NEPHROSTOMY EXCHANGE LEFT  09/18/2017  . IR NEPHROSTOMY EXCHANGE LEFT  11/08/2017  . IR NEPHROSTOMY EXCHANGE LEFT  12/20/2017  . IR NEPHROSTOMY EXCHANGE RIGHT  03/14/2017  . IR NEPHROSTOMY EXCHANGE RIGHT  05/03/2017  . IR NEPHROSTOMY EXCHANGE RIGHT  06/14/2017  . IR NEPHROSTOMY EXCHANGE RIGHT  07/26/2017  . IR NEPHROSTOMY EXCHANGE RIGHT  10/04/2017  . IR NEPHROSTOMY EXCHANGE RIGHT  11/08/2017  . IR NEPHROSTOMY EXCHANGE RIGHT  12/20/2017  . IR NEPHROSTOMY PLACEMENT LEFT  01/19/2017  . IR NEPHROSTOMY PLACEMENT RIGHT  01/19/2017  . IVC FILTER REMOVAL  N/A 08/28/2016   Procedure: IVC Filter Removal;  Surgeon: Katha Cabal, MD;  Location: [place] [facility];  Service: Cardiovascular;  Laterality: N/A;  . LOWER EXTREMITY VENOGRAPHY Left 12/18/2016   Procedure: Lower Extremity Venography;  Surgeon: Katha Cabal, MD;  Location: [place] [facility];  Service: Cardiovascular;  Laterality: Left;  . PERIPHERAL VASCULAR CATHETERIZATION Left 05/08/2016   Procedure: Lower Extremity Venography;  Surgeon: Katha Cabal, MD;  Location: [place] [facility];  Service: Cardiovascular;  Laterality: Left;    Home Medications:  Allergies as  of 01/20/2018   No Known Allergies     Medication List        Accurate as of 01/20/18 11:59 PM. Always use your most recent med list.          B-D ULTRAFINE III SHORT PEN 31G X 8 MM Misc Generic drug:  Insulin Pen Needle   DIGOX 0.25 MG tablet Generic drug:  digoxin Take 0.25 mg by mouth daily.   ELIQUIS 5 MG Tabs tablet Generic drug:  apixaban Take 5 mg by mouth every 12 (twelve) hours.   FREESTYLE LIBRE READER Devi Use 1 Units as directed. Check CBG's four times daily. Dx: E11.9   FREESTYLE LIBRE SENSOR SYSTEM Misc Use 1 Units as directed. Check CBG's four times daily. Dx: E11.9   furosemide 40 MG tablet Commonly known as:  LASIX Take 40 mg by mouth daily.   glimepiride 4 MG tablet Commonly known as:  AMARYL Take 1 tablet by mouth daily.   LANTUS SOLOSTAR 100 UNIT/ML Solostar Pen Generic drug:  Insulin Glargine Inject 20 Units into the skin at bedtime.   losartan 100 MG tablet Commonly known as:  COZAAR Take 100 mg by mouth daily.   metoprolol tartrate 25 MG tablet Commonly known as:  LOPRESSOR Take 25 mg by mouth 2 (two) times daily.   pravastatin 20 MG tablet Commonly known as:  PRAVACHOL take 20mg  by mouth daily at bedtime   sulfamethoxazole-trimethoprim 800-160 MG tablet Commonly known as:  BACTRIM DS,SEPTRA DS Take 1 tablet by mouth every 12 (twelve) hours.   trifluridine-tipiracil 20-8.19 MG tablet Commonly known as:  LONSURF Take 4 tablets (80 mg of trifluridine total) by mouth 2 (two) times daily after a meal. Take on days 1-5, 8-12. Repeat every 28day       Allergies: No Known Allergies  Family History: Family History  Problem Relation Age of Onset  . Diabetes Mother   . Stroke Mother   . Prostate cancer Brother   . Cancer Sister        Breast cancer  . Kidney cancer Neg Hx   . Bladder Cancer Neg Hx     Social History:  reports that he has never smoked. He has never used smokeless tobacco. He reports that he does not drink alcohol  or use drugs.  ROS: UROLOGY Hard to postpone urination?: No Burning/pain with urination?: No Get up at night to urinate?: Yes Leakage of urine?: No Urine stream starts and stops?: No Trouble starting stream?: No Do you have to strain to urinate?: No Blood in urine?: Yes Urinary tract infection?: Yes Sexually transmitted disease?: No Injury to kidneys or bladder?: No Painful intercourse?: No Weak stream?: No Erection problems?: No Penile pain?: No  Gastrointestinal Nausea?: No Vomiting?: No Indigestion/heartburn?: No Diarrhea?: No Constipation?: No  Constitutional Fever: Yes Night sweats?: No Weight loss?: No Fatigue?: No  Skin Skin rash/lesions?: No Itching?: No  Eyes Blurred vision?: No Double vision?:  No  Ears/Nose/Throat Sore throat?: No Sinus problems?: No  Hematologic/Lymphatic Swollen glands?: No Easy bruising?: No  Cardiovascular Leg swelling?: No Chest pain?: No  Respiratory Cough?: No Shortness of breath?: No  Endocrine Excessive thirst?: No  Musculoskeletal Back pain?: No Joint pain?: No  Neurological Headaches?: No Dizziness?: No  Psychologic Depression?: No Anxiety?: No  Physical Exam: BP 115/76 (BP Location: Left Arm, Patient Position: Sitting, Cuff Size: Normal)   Ht 5\' 10"  (1.778 m)   Wt 245 lb 6.4 oz (111.3 kg)   BMI 35.21 kg/m   Constitutional: Well nourished. Alert and oriented, No acute distress. HEENT: [place] AT, moist mucus membranes. Trachea midline, no masses. Cardiovascular: No clubbing, cyanosis, or edema. Respiratory: Normal respiratory effort, no increased work of breathing. GI: Abdomen is soft, non tender, non distended, no abdominal masses. Liver and spleen not palpable.  No hernias appreciated.  Stool sample for occult testing is not indicated.   Mild edema noted in the suprapubic region, but no erythema, tenderness or induration noted.  Nephrostomy tubes in place.  Clear yellow urine in bags.   GU: No CVA  tenderness.  No bladder fullness or masses.  Patient with uncircumcised phallus.  Foreskin easily retracted, with mild/moderate edema.  Urethral meatus is patent.  No penile discharge. No penile lesions or rashes.  There is mild to moderate scrotal edema present.  Scrotal skin is light pink, no tenderness, no erythema, no induration, no crepitus and no fluctuance noted.  Scrotum without lesions, cysts or  rashes.   Testicles are located scrotally bilaterally. No masses are appreciated in the testicles. Left and right epididymis are normal. Rectal: Not performed. Skin: No rashes, bruises or suspicious lesions. Lymph: No cervical or inguinal adenopathy. Neurologic: Grossly intact, no focal deficits, moving all 4 extremities. Psychiatric: Normal mood and affect.   Assessment & Plan:    1. Scrotal/penile swelling Most likely due to soft tissue edema as the skin is pink and no sign of infection is noted Discussed the role of a scrotal ultrasound at this point for further evaluation as daughter is concerned and I expressed my thoughts that it would not likely provide more information or change the course of treatment but would order one if she felt it was necessary, patient declined - I did ask that if anything changes to contact the office and we will reevaluate I also asked his daughter not to use the MyChart system for emergent situations, but to contact the office.  She stated that the reason she uses the MyChart because she has a difficult time getting through on the phone to speak with someone in our office - I stated I would address this with my office manager I did advise the pain to keep the scrotum elevated with a towel while sitting and laying down in an effort to redistribute the fluid  2. UTI Patient is to complete the antibiotic and to contact the office if his symptoms do not resolve or return  3. Leaking from the nephrostomy tube Will see if we can more up his exchange date  Zara Council, PA-C  [facility] [address], [place], [place] [address] [phone]

## 2018-01-20 NOTE — Telephone Encounter (Signed)
-----   Message from Abbie Sons, MD sent at 01/19/2018 11:31 AM EDT ----- Culture of urethral discharge positive for MRSA and was sensitive to prescribed antibiotic.

## 2018-01-20 NOTE — Assessment & Plan Note (Addendum)
#   RECURRENT RECTAL CANCER STAGE IV:  On 3 pills BID [Lonsurf finished 9/20]; feeling poorly [see below]; awaiting on CEA from today.  #Given poor tolerance [given extreme fatigue]; recommend spacing out the cycles to 3 weeks.   # Swelling in the suprapubic area/ scrotal swelling; awaiting evaluation Dr.Brandon this afternoon.   # Chronic kidney disease/ bilateral hydronephrosis; creatinine is fairly stable at 1.9  Currently right-sided percutaneous nephrostomy tube; status post tubes exchange.  STABLE.  #Question recent UTI/MRSA currently on Bactrim; as per urology.  # Back pain- secondary to pelvic mass/PCN; continue hydrocodone stable.  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  Stable.  # Follow up in 3 weeks [poor tolerance]/labs-CBCB/CMP/CEA; will start Lonsurf at that time.

## 2018-01-20 NOTE — Patient Instructions (Signed)
#   DO NOT START LONSUF until next visit in 3 weeks

## 2018-01-20 NOTE — Telephone Encounter (Signed)
Opened in error

## 2018-01-21 ENCOUNTER — Other Ambulatory Visit: Payer: Self-pay | Admitting: Radiology

## 2018-01-21 ENCOUNTER — Encounter: Payer: Self-pay | Admitting: Radiology

## 2018-01-21 DIAGNOSIS — N131 Hydronephrosis with ureteral stricture, not elsewhere classified: Secondary | ICD-10-CM

## 2018-01-21 DIAGNOSIS — N135 Crossing vessel and stricture of ureter without hydronephrosis: Secondary | ICD-10-CM

## 2018-01-21 LAB — CEA: CEA: 37 ng/mL — ABNORMAL HIGH (ref 0.0–4.7)

## 2018-01-21 MED ORDER — CIPROFLOXACIN HCL 500 MG PO TABS: 500.0000 mg | ORAL_TABLET | Freq: Two times a day (BID) | ORAL | 0 refills | Status: DC

## 2018-01-24 ENCOUNTER — Encounter: Payer: Self-pay | Admitting: Diagnostic Radiology

## 2018-01-24 ENCOUNTER — Telehealth: Payer: Self-pay | Admitting: Urology

## 2018-01-24 ENCOUNTER — Ambulatory Visit
Admission: RE | Admit: 2018-01-24 | Discharge: 2018-01-24 | Disposition: A | Discharge status: Home / Self Care | Payer: Medicare Other | Source: Ambulatory Visit | Attending: Urology | Admitting: Urology

## 2018-01-24 DIAGNOSIS — N135 Crossing vessel and stricture of ureter without hydronephrosis: Secondary | ICD-10-CM | POA: Insufficient documentation

## 2018-01-24 DIAGNOSIS — Z436 Encounter for attention to other artificial openings of urinary tract: Secondary | ICD-10-CM | POA: Diagnosis not present

## 2018-01-24 DIAGNOSIS — N1330 Unspecified hydronephrosis: Secondary | ICD-10-CM

## 2018-01-24 DIAGNOSIS — Z85048 Personal history of other malignant neoplasm of rectum, rectosigmoid junction, and anus: Secondary | ICD-10-CM | POA: Diagnosis not present

## 2018-01-24 HISTORY — PX: IR NEPHROSTOMY EXCHANGE LEFT: IMG6069

## 2018-01-24 HISTORY — PX: IR NEPHROSTOMY EXCHANGE RIGHT: IMG6070

## 2018-01-24 MED ORDER — IOPAMIDOL (ISOVUE-300) INJECTION 61%
30.0000 mL | Freq: Once | INTRAVENOUS | Status: AC | PRN
  Administered 2018-01-24: 15 mL

## 2018-01-24 NOTE — Discharge Instructions (Signed)
Percutaneous Nephrostomy, Care After  This sheet gives you information about how to care for yourself after your procedure. Your health care provider may also give you more specific instructions. If you have problems or questions, contact your health care provider.  What can I expect after the procedure?  After the procedure, it is common to have:  · Some soreness where the nephrostomy tube was inserted (tube insertion site).  · Blood-tinged drainage from the nephrostomy tube for the first 24 hours.    Follow these instructions at home:  Activity  · Return to your normal activities as told by your health care provider. Ask your health care provider what activities are safe for you.  · Avoid activities that may cause the nephrostomy tubing to bend.  · Do not take baths, swim, or use a hot tub until your health care provider approves. Ask your health care provider if you can take showers. Cover the nephrostomy tube dressing with a watertight covering when you take a shower.  · Do not drive for 24 hours if you were given a medicine to help you relax (sedative).  Care of the tube insertion site  · Follow instructions from your health care provider about how to take care of your tube insertion site. Make sure you:  ? Wash your hands with soap and water before you change your bandage (dressing). If soap and water are not available, use hand sanitizer.  ? Change your dressing as told by your health care provider. Be careful not to pull on the tube while removing the dressing.  ? When you change the dressing, wash the skin around the tube, rinse well, and pat the skin dry.  · Check the tube insertion area every day for signs of infection. Check for:  ? More redness, swelling, or pain.  ? More fluid or blood.  ? Warmth.  ? Pus or a bad smell.  Care of the nephrostomy tube and drainage bag  · Always keep the tubing, the leg bag, or the bedside drainage bags below the level of the kidney so that your urine drains  freely.  · When connecting your nephrostomy tube to a drainage bag, make sure that there are no kinks in the tubing and that your urine is draining freely. You may want to use an elastic bandage to wrap any exposed tubing that goes from the nephrostomy tube to any of the connecting tubes.  · At night, you may want to connect your nephrostomy tube or the leg bag to a larger bedside drainage bag.  · Follow instructions from your health care provider about how to empty or change the drainage bag.  · Empty the drainage bag when it becomes ? full.  · Replace the drainage bag and any extension tubing that is connected to your nephrostomy tube every 3 weeks or as often as told by your health care provider. Your health care provider will explain how to change the drainage bag and extension tubing.  General instructions  · Take over-the-counter and prescription medicines only as told by your health care provider.  · Keep all follow-up visits as told by your health care provider. This is important.  Contact a health care provider if:  · You have problems with any of the valves or tubing.  · You have persistent pain or soreness in your back.  · You have more redness, swelling, or pain around your tube insertion site.  · You have more fluid or blood coming from   your tube insertion site.  · Your tube insertion site feels warm to the touch.  · You have pus or a bad smell coming from your tube insertion site.  · You have increased urine output or you feel burning when urinating.  Get help right away if:  · You have pain in your abdomen during the first week.  · You have chest pain or have trouble breathing.  · You have a new appearance of blood in your urine.  · You have a fever or chills.  · You have back pain that is not relieved by your medicine.  · You have decreased urine output.  · Your nephrostomy tube comes out.  This information is not intended to replace advice given to you by your health care provider. Make sure you  discuss any questions you have with your health care provider.  Document Released: 12/08/2003 Document Revised: 01/27/2016 Document Reviewed: 01/27/2016  Elsevier Interactive Patient Education © 2018 [employer]

## 2018-01-24 NOTE — Procedures (Signed)
Successful exchange of bilateral nephrostomy tubes.  Minimal blood loss and no immediate complication.  See report in IMAGING for complete procedure details.

## 2018-01-24 NOTE — Telephone Encounter (Signed)
I tried calling the patient to check to see how his nephro tube exchange went today and to check on the status of his scrotal edema, but he did not answer and his voice mail box was full.

## 2018-02-07 ENCOUNTER — Telehealth: Payer: Self-pay | Admitting: Radiology

## 2018-02-07 NOTE — Telephone Encounter (Signed)
Noted  

## 2018-02-07 NOTE — Telephone Encounter (Signed)
-----   Message from Hollice Espy, MD sent at 01/30/2018 10:47 AM EDT ----- Lilian Coma you make sure his next one is arrange for in 6-8 weeks?  Hollice Espy, MD  ----- Message ----- From: Interface, Rad Results In Sent: 01/24/2018   2:56 PM EDT To: Hollice Espy, MD

## 2018-02-10 ENCOUNTER — Encounter: Payer: Self-pay | Admitting: Nurse Practitioner

## 2018-02-10 ENCOUNTER — Telehealth: Payer: Self-pay | Admitting: Urology

## 2018-02-10 ENCOUNTER — Inpatient Hospital Stay: Payer: Medicare Other | Attending: Internal Medicine

## 2018-02-10 ENCOUNTER — Inpatient Hospital Stay (HOSPITAL_BASED_OUTPATIENT_CLINIC_OR_DEPARTMENT_OTHER): Payer: Medicare Other | Admitting: Nurse Practitioner

## 2018-02-10 VITALS — BP 132/75 | HR 93 | Temp 98.3°F | Resp 16 | Wt 244.0 lb

## 2018-02-10 DIAGNOSIS — M7989 Other specified soft tissue disorders: Secondary | ICD-10-CM | POA: Diagnosis not present

## 2018-02-10 DIAGNOSIS — R530 Neoplastic (malignant) related fatigue: Secondary | ICD-10-CM | POA: Diagnosis not present

## 2018-02-10 DIAGNOSIS — Z794 Long term (current) use of insulin: Secondary | ICD-10-CM | POA: Insufficient documentation

## 2018-02-10 DIAGNOSIS — N189 Chronic kidney disease, unspecified: Secondary | ICD-10-CM | POA: Insufficient documentation

## 2018-02-10 DIAGNOSIS — Z87440 Personal history of urinary (tract) infections: Secondary | ICD-10-CM | POA: Insufficient documentation

## 2018-02-10 DIAGNOSIS — N5089 Other specified disorders of the male genital organs: Secondary | ICD-10-CM

## 2018-02-10 DIAGNOSIS — M549 Dorsalgia, unspecified: Secondary | ICD-10-CM | POA: Insufficient documentation

## 2018-02-10 DIAGNOSIS — N3289 Other specified disorders of bladder: Secondary | ICD-10-CM | POA: Diagnosis not present

## 2018-02-10 DIAGNOSIS — Z79899 Other long term (current) drug therapy: Secondary | ICD-10-CM | POA: Insufficient documentation

## 2018-02-10 DIAGNOSIS — G893 Neoplasm related pain (acute) (chronic): Secondary | ICD-10-CM

## 2018-02-10 DIAGNOSIS — N136 Pyonephrosis: Secondary | ICD-10-CM | POA: Diagnosis not present

## 2018-02-10 DIAGNOSIS — E119 Type 2 diabetes mellitus without complications: Secondary | ICD-10-CM | POA: Diagnosis not present

## 2018-02-10 DIAGNOSIS — I130 Hypertensive heart and chronic kidney disease with heart failure and stage 1 through stage 4 chronic kidney disease, or unspecified chronic kidney disease: Secondary | ICD-10-CM | POA: Insufficient documentation

## 2018-02-10 DIAGNOSIS — R369 Urethral discharge, unspecified: Secondary | ICD-10-CM | POA: Insufficient documentation

## 2018-02-10 DIAGNOSIS — C20 Malignant neoplasm of rectum: Secondary | ICD-10-CM | POA: Diagnosis not present

## 2018-02-10 DIAGNOSIS — Z7901 Long term (current) use of anticoagulants: Secondary | ICD-10-CM | POA: Diagnosis not present

## 2018-02-10 DIAGNOSIS — I4891 Unspecified atrial fibrillation: Secondary | ICD-10-CM

## 2018-02-10 DIAGNOSIS — R301 Vesical tenesmus: Secondary | ICD-10-CM | POA: Diagnosis not present

## 2018-02-10 DIAGNOSIS — Z86718 Personal history of other venous thrombosis and embolism: Secondary | ICD-10-CM | POA: Diagnosis not present

## 2018-02-10 DIAGNOSIS — R319 Hematuria, unspecified: Secondary | ICD-10-CM | POA: Insufficient documentation

## 2018-02-10 LAB — CBC WITH DIFFERENTIAL/PLATELET
Abs Immature Granulocytes: 0.09 10*3/uL — ABNORMAL HIGH (ref 0.00–0.07)
BASOS ABS: 0.1 10*3/uL (ref 0.0–0.1)
Basophils Relative: 1 %
Eosinophils Absolute: 0.5 10*3/uL (ref 0.0–0.5)
Eosinophils Relative: 5 %
HEMATOCRIT: 30.5 % — AB (ref 39.0–52.0)
HEMOGLOBIN: 10.1 g/dL — AB (ref 13.0–17.0)
Immature Granulocytes: 1 %
LYMPHS ABS: 1.1 10*3/uL (ref 0.7–4.0)
Lymphocytes Relative: 11 %
MCH: 27.9 pg (ref 26.0–34.0)
MCHC: 33.1 g/dL (ref 30.0–36.0)
MCV: 84.3 fL (ref 80.0–100.0)
Monocytes Absolute: 0.7 10*3/uL (ref 0.1–1.0)
Monocytes Relative: 7 %
NRBC: 0 % (ref 0.0–0.2)
Neutro Abs: 8.2 10*3/uL — ABNORMAL HIGH (ref 1.7–7.7)
Neutrophils Relative %: 75 %
Platelets: 275 10*3/uL (ref 150–400)
RBC: 3.62 MIL/uL — ABNORMAL LOW (ref 4.22–5.81)
RDW: 14.4 % (ref 11.5–15.5)
WBC: 10.7 10*3/uL — ABNORMAL HIGH (ref 4.0–10.5)

## 2018-02-10 LAB — COMPREHENSIVE METABOLIC PANEL
ALT: 12 U/L (ref 0–44)
ANION GAP: 7 (ref 5–15)
AST: 17 U/L (ref 15–41)
Albumin: 2.9 g/dL — ABNORMAL LOW (ref 3.5–5.0)
Alkaline Phosphatase: 92 U/L (ref 38–126)
BUN: 20 mg/dL (ref 8–23)
CHLORIDE: 104 mmol/L (ref 98–111)
CO2: 22 mmol/L (ref 22–32)
Calcium: 8.7 mg/dL — ABNORMAL LOW (ref 8.9–10.3)
Creatinine, Ser: 1.39 mg/dL — ABNORMAL HIGH (ref 0.61–1.24)
GFR calc non Af Amer: 51 mL/min — ABNORMAL LOW (ref >60)
GFR, EST AFRICAN AMERICAN: 59 mL/min — AB (ref >60)
Glucose, Bld: 222 mg/dL — ABNORMAL HIGH (ref 70–99)
Potassium: 4.3 mmol/L (ref 3.5–5.1)
SODIUM: 133 mmol/L — AB (ref 135–145)
Total Bilirubin: 0.5 mg/dL (ref 0.3–1.2)
Total Protein: 7.2 g/dL (ref 6.5–8.1)

## 2018-02-10 NOTE — Telephone Encounter (Signed)
I spoke back with Nira Conn just now and offered an appointment today with Larene Beach or if she wanted to wait and see what Dr. Erlene Quan said? She opted to wait. He does have an appointment today at the cancer center and I told her we would get back to her as soon as we could. She just wanted you to know that he was on the verge of going to the ED due to the amount of pain he was in.  She was ok with waiting.   Sharyn Lull

## 2018-02-10 NOTE — Telephone Encounter (Signed)
Patient's daughter called because the patient felt something rupture and is having a large amount of blood coming out of his penis and now he has a milky discharge he is in alot of pain. She wants to know what we are or should do about this? He is in a lot of pain and discomfort. I will speak to Judson Roch in the meantime to see what her thought's are. She can be reached @ [phone]  [facility]

## 2018-02-10 NOTE — Progress Notes (Signed)
[place] OFFICE PROGRESS NOTE  Patient Care Team: Dion Body, MD as PCP - General (Family Medicine) Clent Jacks, RN as Registered Nurse Cammie Sickle, MD as Medical Oncologist (Medical Oncology)  Cancer Staging No matching staging information was found for the patient.   Oncology History   # 2012- RECTO-SIGMOID-poorly diff STAGE III CA [s/p neo-adj chemo-RT- poor response]; APR [residual ypT3; 5/8 LN positiveDr.Smith/Dr.Pandit]  S/p FOLFOX   # SEP 2019- Pre-sacral fluid/mass- increasing ~ 5-6 cm [increased from dec 2017; also NEW retroperitoneal lymph nodes; inguinal adenopathy]; SEP 2018- left supraclavicular; mediastinal; right hilar-retroperitoneal; pelvic mass/node; bilateral inguinal adenopathy; s/p right Ingiunal LNBx - RECURRENT CA [necrotic;insuff tissue for F-One]  # OCT 8th 2018-FOLFIRI [first cycle 5FU alone]; OCT 22nd FOLFIRI; April 2019- significant PR  # MAY 2019- FOLFIRI q 3W [pt pref]; AUG 2019- CT Progression/ ingiunal LN; Aug 26th Lonsurf  ------------------------------  ---  OCT-NOV 2018-Sepsis x2 sec to UTI [s/p ABx;]  # s/p RT rectal [11/28-last treatment]  # Acute renal insuff [s/p bil PCN]; hematuria- sec to bladder invol [? Contra-indication to avastin]  # LEFT LE DVT [? [facility]- Dr.Schneir]; Eliquis.   # CHF/COPD/CKD/ PN sec to [place]  # [facility] One-PDL-1/ TPS- 0% [2012-rectal specimen];12/26-Omniseq- K-RAS MUTATED;MSS; no targets** ---------------------------------------------   DIAGNOSIS: rectal cancer  STAGE:   IV  ; GOALS: palliative  CURRENT/MOST RECENT THERAPY: LONSURF [AUG  26th]      Rectal cancer ([place])      INTERVAL HISTORY: Donald Townsend 67 y.o.  male pleasant patient with above history of metastatic rectal cancer, currently on Lonsurf, s/p cycle 1, who returns to clinic today for follow-up.   Patient was last seen by Dr. Rogue Bussing on 01/20/2018.  He completed cycle 1 of Lonsurf on  01/17/2018.  Due to extreme fatigue plan was to space out cycles to every 3 weeks.  Creatinine 1.9 stable post nephrostomy tube exchange.    He had previously complained of urinary urgency to pick swelling with bloody/purulent discharge with no evidence of urinary retention and was evaluated by Urology. Urine culture positive for MRSA. Urology placed him on Septra DS per sensitivity.  He was complaining of leakage from nephrostomy tubes and they were exchanged on 01/24/2018.    Today, he continues to have significant fatigue which is stable since cycle of Lonsurf. He has clear yellow urine in nephrostomy bags and reportedly "good" output.  Earlier today, he felt spasm like pain in pelvis and had bloody/milky discharge from penis with severe pain. This resolved after single episode and he has not had any additional discharge or blood. He states pain resolved. He continues to have chronic back pain. Denies diarrhea. He says that he feels poorly overall and with episode of bloody discharge this morning he does not want to start next cycle of Lonsurf. His daughter has called Urology for symptoms from this morning but has not heard back.    Review of Systems  Constitutional: Positive for malaise/fatigue. Negative for chills, diaphoresis, fever and weight loss.  HENT: Negative for nosebleeds and sore throat.   Eyes: Negative for double vision.  Respiratory: Negative for cough, hemoptysis, sputum production, shortness of breath and wheezing.   Cardiovascular: Negative for chest pain, palpitations, orthopnea and leg swelling.  Gastrointestinal: Negative for abdominal pain, blood in stool, constipation, diarrhea, heartburn, melena, nausea and vomiting.  Genitourinary: Positive for hematuria. Negative for dysuria, frequency and urgency.       Suprapubic/penile pain x 1 -  now resolved  Musculoskeletal: Positive for back pain and joint pain.  Skin: Negative.  Negative for itching and rash.  Neurological:  Negative for dizziness, tingling, focal weakness, weakness and headaches.  Endo/Heme/Allergies: Does not bruise/bleed easily.  Psychiatric/Behavioral: Negative for depression. The patient is not nervous/anxious and does not have insomnia.      PAST MEDICAL HISTORY :  Past Medical History:  Diagnosis Date  . A-fib ([place])   . Cancer [facility])    Colon  . CHF (congestive heart failure) ([place])   . Diabetes mellitus without complication ([place])   . Dyspnea   . Elevated lipids   . Hypertension   . PVD (peripheral vascular disease) ([place])     PAST SURGICAL HISTORY :   Past Surgical History:  Procedure Laterality Date  . COLON RESECTION     with colostomy  . COLONOSCOPY    . CYSTOSCOPY WITH STENT PLACEMENT Bilateral 01/18/2017   Procedure: CYSTOSCOPY WITH STENT PLACEMENT;  Surgeon: [name], MD;  Location: [facility] ORS;  Service: Urology;  Laterality: Bilateral;  . IR FLUORO GUIDE PORT INSERTION RIGHT  01/24/2017  . IR NEPHROSTOGRAM LEFT THRU EXISTING ACCESS  09/18/2017  . IR NEPHROSTOGRAM RIGHT THRU EXISTING ACCESS  09/18/2017  . IR NEPHROSTOMY EXCHANGE LEFT  03/14/2017  . IR NEPHROSTOMY EXCHANGE LEFT  05/03/2017  . IR NEPHROSTOMY EXCHANGE LEFT  06/14/2017  . IR NEPHROSTOMY EXCHANGE LEFT  07/26/2017  . IR NEPHROSTOMY EXCHANGE LEFT  09/18/2017  . IR NEPHROSTOMY EXCHANGE LEFT  11/08/2017  . IR NEPHROSTOMY EXCHANGE LEFT  12/20/2017  . IR NEPHROSTOMY EXCHANGE LEFT  01/24/2018  . IR NEPHROSTOMY EXCHANGE RIGHT  03/14/2017  . IR NEPHROSTOMY EXCHANGE RIGHT  05/03/2017  . IR NEPHROSTOMY EXCHANGE RIGHT  06/14/2017  . IR NEPHROSTOMY EXCHANGE RIGHT  07/26/2017  . IR NEPHROSTOMY EXCHANGE RIGHT  10/04/2017  . IR NEPHROSTOMY EXCHANGE RIGHT  11/08/2017  . IR NEPHROSTOMY EXCHANGE RIGHT  12/20/2017  . IR NEPHROSTOMY EXCHANGE RIGHT  01/24/2018  . IR NEPHROSTOMY PLACEMENT LEFT  01/19/2017  . IR NEPHROSTOMY PLACEMENT RIGHT  01/19/2017  . IVC FILTER REMOVAL N/A 08/28/2016   Procedure: IVC Filter Removal;  Surgeon: Katha Cabal, MD;  Location: [place] [facility] LAB;  Service: Cardiovascular;  Laterality: N/A;  . LOWER EXTREMITY VENOGRAPHY Left 12/18/2016   Procedure: Lower Extremity Venography;  Surgeon: Katha Cabal, MD;  Location: [place] CV LAB;  Service: Cardiovascular;  Laterality: Left;  . PERIPHERAL VASCULAR CATHETERIZATION Left 05/08/2016   Procedure: Lower Extremity Venography;  Surgeon: Katha Cabal, MD;  Location: [place] [facility];  Service: Cardiovascular;  Laterality: Left;    FAMILY HISTORY :   Family History  Problem Relation Age of Onset  . Diabetes Mother   . Stroke Mother   . Prostate cancer Brother   . Cancer Sister        Breast cancer  . Kidney cancer Neg Hx   . Bladder Cancer Neg Hx     SOCIAL HISTORY:   Social History   Tobacco Use  . Smoking status: Never Smoker  . Smokeless tobacco: Never Used  Substance Use Topics  . Alcohol use: No  . Drug use: No    ALLERGIES:  has No Known Allergies.  MEDICATIONS:  Current Outpatient Medications  Medication Sig Dispense Refill  . B-D ULTRAFINE III SHORT PEN 31G X 8 MM MISC     . Continuous Blood Gluc Receiver (FREESTYLE LIBRE READER) DEVI Use 1 Units as directed. Check CBG's four  times daily. Dx: E11.9    . Continuous Blood Gluc Sensor (FREESTYLE LIBRE SENSOR SYSTEM) MISC Use 1 Units as directed. Check CBG's four times daily. Dx: E11.9    . DIGOX 250 MCG tablet Take 0.25 mg by mouth daily.     [name] ELIQUIS 5 MG TABS tablet Take 5 mg by mouth every 12 (twelve) hours.  0  . furosemide (LASIX) 40 MG tablet Take 40 mg by mouth daily.   0  . glimepiride (AMARYL) 4 MG tablet Take 1 tablet by mouth daily.    [name] LANTUS SOLOSTAR 100 UNIT/ML Solostar Pen Inject 20 Units into the skin at bedtime.   0  . losartan (COZAAR) 100 MG tablet Take 100 mg by mouth daily.    . metoprolol tartrate (LOPRESSOR) 25 MG tablet Take 25 mg by mouth 2 (two) times daily.     . pravastatin (PRAVACHOL) 20 MG tablet take 15m by mouth daily at  bedtime    . sulfamethoxazole-trimethoprim (BACTRIM DS,SEPTRA DS) 800-160 MG tablet Take 1 tablet by mouth every 12 (twelve) hours. 14 tablet 0  . trifluridine-tipiracil (LONSURF) 20-8.19 MG tablet Take 4 tablets (80 mg of trifluridine total) by mouth 2 (two) times daily after a meal. Take on days 1-5, 8-12. Repeat every 28day 80 tablet 3   No current facility-administered medications for this visit.     PHYSICAL EXAMINATION: ECOG PERFORMANCE STATUS: 1 - Symptomatic but completely ambulatory  BP 132/75 (BP Location: Left Arm, Patient Position: Sitting)   Pulse 93   Temp 98.3 F (36.8 C) (Tympanic)   Resp 16   Wt 244 lb (110.7 kg)   BMI 35.01 kg/m   Filed Weights   02/10/18 1316  Weight: 244 lb (110.7 kg)    Physical Exam  Constitutional: He is oriented to person, place, and time and well-developed, well-nourished, and in no distress. No distress.  Unaccompanied.   HENT:  Head: Normocephalic and atraumatic.  Mouth/Throat: Oropharynx is clear and moist. No oropharyngeal exudate.  Eyes: Pupils are equal, round, and reactive to light.  Neck: Normal range of motion. Neck supple.  Cardiovascular: Normal rate and regular rhythm.  Pulmonary/Chest: No respiratory distress. He has no wheezes.  Decreased bilaterally at bases  Abdominal: Soft. Bowel sounds are normal. He exhibits no distension and no mass. There is no tenderness. There is no rebound and no guarding.  Genitourinary:  Genitourinary Comments: Suprapubic, penile, scrotal swelling noted.  Bilateral nephrostomy tubes in place with clear yellow urine in bags.   Musculoskeletal: Normal range of motion. He exhibits no edema or tenderness.  Neurological: He is alert and oriented to person, place, and time.  Skin: Skin is warm.  Psychiatric: Affect normal.    LABORATORY DATA:  I have reviewed the data as listed CMP Latest Ref Rng & Units 02/10/2018 01/20/2018 01/08/2018  Glucose 70 - 99 mg/dL 222(H) 280(H) 188(H)  BUN 8 - 23  mg/dL 20 35(H) 31(H)  Creatinine 0.61 - 1.24 mg/dL 1.39(H) 1.98(H) 1.57(H)  Sodium 135 - 145 mmol/L 133(L) 132(L) 136  Potassium 3.5 - 5.1 mmol/L 4.3 4.6 4.0  Chloride 98 - 111 mmol/L 104 102 105  CO2 22 - 32 mmol/L 22 20(L) 21(L)  Calcium 8.9 - 10.3 mg/dL 8.7(L) 8.8(L) 9.3  Total Protein 6.5 - 8.1 g/dL 7.2 7.1 7.4  Total Bilirubin 0.3 - 1.2 mg/dL 0.5 0.4 0.5  Alkaline Phos 38 - 126 U/L 92 122 77  AST 15 - 41 U/L _0 ALT 0 -  44 U/L _0 No results found for: SPEP, UPEP  Lab Results  Component Value Date   WBC 10.7 (H) 02/10/2018   NEUTROABS 8.2 (H) 02/10/2018   HGB 10.1 (L) 02/10/2018   HCT 30.5 (L) 02/10/2018   MCV 84.3 02/10/2018   PLT 275 02/10/2018      Chemistry      Component Value Date/Time   NA 133 (L) 02/10/2018 1253   NA 139 12/17/2012 0608   K 4.3 02/10/2018 1253   K 3.8 12/17/2012 0608   CL 104 02/10/2018 1253   CL 108 (H) 12/17/2012 0608   CO2 22 02/10/2018 1253   CO2 26 12/17/2012 0608   BUN 20 02/10/2018 1253   BUN 21 01/01/2017 1621   BUN 15 12/17/2012 0608   CREATININE 1.39 (H) 02/10/2018 1253   CREATININE 0.84 12/17/2012 0608      Component Value Date/Time   CALCIUM 8.7 (L) 02/10/2018 1253   CALCIUM 8.2 (L) 12/17/2012 0608   ALKPHOS 92 02/10/2018 1253   ALKPHOS 78 12/17/2012 0608   AST 17 02/10/2018 1253   AST 16 12/17/2012 0608   ALT 12 02/10/2018 1253   ALT 23 12/17/2012 0608   BILITOT 0.5 02/10/2018 1253   BILITOT 0.7 12/17/2012 0608      RADIOGRAPHIC STUDIES: I have personally reviewed the radiological images as listed and agreed with the findings in the report. No results found.   ASSESSMENT & PLAN:  No problem-specific Assessment & Plan notes found for this encounter.  1.  Recurrent rectal cancer-stage IV- s/p cycle 1 of Lonsurf (3 pills BID completed 01/17/18). CEA today pending. CEA previously 37.0, slight elevation. Lonsurf spaced to q3weeks d/t extreme fatigue. Counts today acceptable for next cycle but patient  wishes to delay d/t infection/recent pain. RTC in 2 weeks for re-consideration of cycle 2.   2.  Hematuria- suspect r/t recent infection. Questioning if resolved though due to blood and discharge this morning and ANC 8.2, WBC 10.7 today. Afebrile and symptoms currently resolved. Continue Bactrim DS per Urology .  Encouraged patient to discuss with Urology for further evaluation as he may need extended course/treatment change.   3. Suprapubic/Scrotal Swelling- unclear etiology- discussed scrotal ultrasound but patient declined. Encouraged elevation/scrotal support.   4. Nephrostomy Tube Status- leaking resolved post exchange with good urinary output per patient.   5. Chemotherapy related fatigue- significant fatigue with Lonsurf. Dosing scheduled adjusted. Persistent fatigue may be malignancy related? Infection? Lonsurf? Re-evaluate at next visit.   RTC in 2 weeks for labs and re-evaluation with Dr. Rogue Bussing. Consider Cycle 2 of Lonsurf at that time. Follow up with Urology for UTI.    Orders Placed This Encounter  Procedures  . CBC with Differential/Platelet    Standing Status:   Future    Standing Expiration Date:   02/11/2019  . Comprehensive metabolic panel    Standing Status:   Future    Standing Expiration Date:   02/11/2019   All questions were answered. The patient knows to call the clinic with any problems, questions or concerns.    Beckey Rutter, DNP, AGNP-C [place] [facility] [place] (work cell) [phone] (office)  CC: Dr. Rogue Bussing

## 2018-02-11 ENCOUNTER — Encounter: Payer: Self-pay | Admitting: Urology

## 2018-02-11 LAB — CEA: CEA1: 55.1 ng/mL — AB (ref 0.0–4.7)

## 2018-02-11 NOTE — Telephone Encounter (Signed)
OK, that's reassuring.  If things get worse, will order CT scan of pelvis.    Hollice Espy, MD

## 2018-02-11 NOTE — Telephone Encounter (Signed)
Patient states he is still having pain and pressure abscess and puss in bladder, blood, clots. He is still taking the pain meds. He states the flow had restricted and he was unable to urinate, in the middle of the night 2 nights ago he had a huge clump of stuff come out and he has been urinating fine since.

## 2018-02-11 NOTE — Telephone Encounter (Signed)
Can someone please call and check and see how he is doing?  Is his pain improving?  Notes from the cancer center not yet complete from yesterday.  Hollice Espy, MD

## 2018-02-13 ENCOUNTER — Ambulatory Visit
Admission: RE | Admit: 2018-02-13 | Discharge: 2018-02-13 | Disposition: A | Discharge status: Home / Self Care | Payer: Medicare Other | Source: Ambulatory Visit | Attending: Urology | Admitting: Urology

## 2018-02-13 ENCOUNTER — Other Ambulatory Visit: Payer: Self-pay | Admitting: Urology

## 2018-02-13 DIAGNOSIS — C20 Malignant neoplasm of rectum: Secondary | ICD-10-CM

## 2018-02-13 DIAGNOSIS — R590 Localized enlarged lymph nodes: Secondary | ICD-10-CM | POA: Insufficient documentation

## 2018-02-13 DIAGNOSIS — R369 Urethral discharge, unspecified: Secondary | ICD-10-CM | POA: Diagnosis not present

## 2018-02-13 DIAGNOSIS — R102 Pelvic and perineal pain: Secondary | ICD-10-CM | POA: Diagnosis not present

## 2018-02-13 DIAGNOSIS — Z936 Other artificial openings of urinary tract status: Secondary | ICD-10-CM | POA: Diagnosis not present

## 2018-02-13 DIAGNOSIS — C19 Malignant neoplasm of rectosigmoid junction: Secondary | ICD-10-CM | POA: Insufficient documentation

## 2018-02-13 DIAGNOSIS — N3289 Other specified disorders of bladder: Secondary | ICD-10-CM | POA: Insufficient documentation

## 2018-02-13 DIAGNOSIS — C218 Malignant neoplasm of overlapping sites of rectum, anus and anal canal: Secondary | ICD-10-CM | POA: Diagnosis not present

## 2018-02-13 MED ORDER — IOPAMIDOL (ISOVUE-300) INJECTION 61%
100.0000 mL | Freq: Once | INTRAVENOUS | Status: AC | PRN
  Administered 2018-02-13: 100 mL via INTRAVENOUS

## 2018-02-13 NOTE — Progress Notes (Unsigned)
Please help arrange STAT scan today.  Hollice Espy, MD

## 2018-02-13 NOTE — Progress Notes (Signed)
STAT CT has been arranged.  Patient has not had any sold food since breakfast.  He was advised to go to the [facility] to the registration desk for a STAT CT scan.    Patient expressed understanding.

## 2018-02-14 ENCOUNTER — Telehealth: Payer: Self-pay | Admitting: Urology

## 2018-02-14 ENCOUNTER — Encounter: Payer: Self-pay | Admitting: Internal Medicine

## 2018-02-14 NOTE — Telephone Encounter (Addendum)
Called patient to discuss.  He continues to have pelvis pain and penile discharge.  Scan looks essentially stable, nothing new in bladder or pelvis that is changed from previous scans.  Some lymph nodes appear to be enlarging, will forward results to Dr. Rogue Bussing and let him know about the scan.    I do not have a reason for increased pain other than progression of cancer.  We can try bladder spasm medications to see if this helps.  He can pick up samples of mybetriq 25 mg from our office to try.  He will have his daughter stop by.  Hollice Espy, MD

## 2018-02-17 ENCOUNTER — Encounter: Payer: Self-pay | Admitting: Nurse Practitioner

## 2018-02-17 ENCOUNTER — Other Ambulatory Visit: Payer: Self-pay

## 2018-02-17 ENCOUNTER — Telehealth: Payer: Self-pay | Admitting: *Deleted

## 2018-02-17 ENCOUNTER — Inpatient Hospital Stay: Payer: Medicare Other

## 2018-02-17 ENCOUNTER — Inpatient Hospital Stay (HOSPITAL_BASED_OUTPATIENT_CLINIC_OR_DEPARTMENT_OTHER): Payer: Medicare Other | Admitting: Nurse Practitioner

## 2018-02-17 ENCOUNTER — Telehealth: Payer: Self-pay | Admitting: Internal Medicine

## 2018-02-17 ENCOUNTER — Other Ambulatory Visit: Payer: Self-pay | Admitting: *Deleted

## 2018-02-17 VITALS — BP 99/66 | HR 91 | Temp 98.0°F | Resp 18 | Wt 239.6 lb

## 2018-02-17 DIAGNOSIS — E119 Type 2 diabetes mellitus without complications: Secondary | ICD-10-CM

## 2018-02-17 DIAGNOSIS — C20 Malignant neoplasm of rectum: Secondary | ICD-10-CM

## 2018-02-17 DIAGNOSIS — R319 Hematuria, unspecified: Secondary | ICD-10-CM

## 2018-02-17 DIAGNOSIS — N3289 Other specified disorders of bladder: Secondary | ICD-10-CM | POA: Diagnosis not present

## 2018-02-17 DIAGNOSIS — N136 Pyonephrosis: Secondary | ICD-10-CM | POA: Diagnosis not present

## 2018-02-17 DIAGNOSIS — Z794 Long term (current) use of insulin: Secondary | ICD-10-CM

## 2018-02-17 DIAGNOSIS — I4891 Unspecified atrial fibrillation: Secondary | ICD-10-CM | POA: Diagnosis not present

## 2018-02-17 DIAGNOSIS — R369 Urethral discharge, unspecified: Secondary | ICD-10-CM | POA: Diagnosis not present

## 2018-02-17 DIAGNOSIS — M7989 Other specified soft tissue disorders: Secondary | ICD-10-CM | POA: Diagnosis not present

## 2018-02-17 DIAGNOSIS — R530 Neoplastic (malignant) related fatigue: Secondary | ICD-10-CM

## 2018-02-17 DIAGNOSIS — Z7189 Other specified counseling: Secondary | ICD-10-CM

## 2018-02-17 DIAGNOSIS — N5089 Other specified disorders of the male genital organs: Secondary | ICD-10-CM

## 2018-02-17 DIAGNOSIS — G893 Neoplasm related pain (acute) (chronic): Secondary | ICD-10-CM | POA: Diagnosis not present

## 2018-02-17 DIAGNOSIS — M549 Dorsalgia, unspecified: Secondary | ICD-10-CM | POA: Diagnosis not present

## 2018-02-17 DIAGNOSIS — Z7901 Long term (current) use of anticoagulants: Secondary | ICD-10-CM

## 2018-02-17 DIAGNOSIS — R310 Gross hematuria: Secondary | ICD-10-CM

## 2018-02-17 DIAGNOSIS — Z87440 Personal history of urinary (tract) infections: Secondary | ICD-10-CM

## 2018-02-17 DIAGNOSIS — R301 Vesical tenesmus: Secondary | ICD-10-CM

## 2018-02-17 DIAGNOSIS — Z79899 Other long term (current) drug therapy: Secondary | ICD-10-CM

## 2018-02-17 DIAGNOSIS — N189 Chronic kidney disease, unspecified: Secondary | ICD-10-CM | POA: Diagnosis not present

## 2018-02-17 LAB — COMPREHENSIVE METABOLIC PANEL
ALBUMIN: 3.2 g/dL — AB (ref 3.5–5.0)
ALT: 12 U/L (ref 0–44)
ANION GAP: 11 (ref 5–15)
AST: 19 U/L (ref 15–41)
Alkaline Phosphatase: 87 U/L (ref 38–126)
BUN: 23 mg/dL (ref 8–23)
CHLORIDE: 102 mmol/L (ref 98–111)
CO2: 20 mmol/L — AB (ref 22–32)
Calcium: 9.1 mg/dL (ref 8.9–10.3)
Creatinine, Ser: 1.58 mg/dL — ABNORMAL HIGH (ref 0.61–1.24)
GFR calc Af Amer: 51 mL/min — ABNORMAL LOW (ref >60)
GFR calc non Af Amer: 44 mL/min — ABNORMAL LOW (ref >60)
GLUCOSE: 176 mg/dL — AB (ref 70–99)
POTASSIUM: 4.4 mmol/L (ref 3.5–5.1)
SODIUM: 133 mmol/L — AB (ref 135–145)
TOTAL PROTEIN: 7.6 g/dL (ref 6.5–8.1)
Total Bilirubin: 0.7 mg/dL (ref 0.3–1.2)

## 2018-02-17 LAB — CBC WITH DIFFERENTIAL/PLATELET
ABS IMMATURE GRANULOCYTES: 0.09 10*3/uL — AB (ref 0.00–0.07)
BASOS PCT: 1 %
Basophils Absolute: 0.1 10*3/uL (ref 0.0–0.1)
Eosinophils Absolute: 0.8 10*3/uL — ABNORMAL HIGH (ref 0.0–0.5)
Eosinophils Relative: 4 %
HCT: 31.2 % — ABNORMAL LOW (ref 39.0–52.0)
HEMOGLOBIN: 10.6 g/dL — AB (ref 13.0–17.0)
IMMATURE GRANULOCYTES: 1 %
LYMPHS PCT: 7 %
Lymphs Abs: 1.2 10*3/uL (ref 0.7–4.0)
MCH: 28.2 pg (ref 26.0–34.0)
MCHC: 34 g/dL (ref 30.0–36.0)
MCV: 83 fL (ref 80.0–100.0)
MONOS PCT: 7 %
Monocytes Absolute: 1.2 10*3/uL — ABNORMAL HIGH (ref 0.1–1.0)
NEUTROS ABS: 15 10*3/uL — AB (ref 1.7–7.7)
NEUTROS PCT: 80 %
PLATELETS: 318 10*3/uL (ref 150–400)
RBC: 3.76 MIL/uL — ABNORMAL LOW (ref 4.22–5.81)
RDW: 14.7 % (ref 11.5–15.5)
WBC: 18.4 10*3/uL — ABNORMAL HIGH (ref 4.0–10.5)
nRBC: 0 % (ref 0.0–0.2)

## 2018-02-17 MED ORDER — HYDROCODONE-ACETAMINOPHEN 10-325 MG PO TABS: 1.0000 | ORAL_TABLET | Freq: Four times a day (QID) | ORAL | 0 refills | Status: DC | PRN

## 2018-02-17 MED ORDER — SULFAMETHOXAZOLE-TRIMETHOPRIM 800-160 MG PO TABS: 1.0000 | ORAL_TABLET | Freq: Two times a day (BID) | ORAL | 0 refills | Status: AC

## 2018-02-17 NOTE — Telephone Encounter (Signed)
Per Dr B, patient to see NP and can admit if necessary. Patient accepts appointment for this afternoon

## 2018-02-17 NOTE — Progress Notes (Signed)
Symptom Management [place]  [phone] [phone]  Patient Care Team: Dion Body, MD as PCP - General (Family Medicine) Clent Jacks, RN as Registered Nurse Cammie Sickle, MD as Medical Oncologist (Medical Oncology)   Name of the patient: Donald Townsend  [id number]  1950-08-30   Date of visit: 02/17/18  Diagnosis- Colon Cancer  Chief complaint/ Reason for visit- Hematuria/Penile Discharge & Pain  Heme/Onc history:  Oncology History   # 2012- RECTO-SIGMOID-poorly diff STAGE III CA [s/p neo-adj chemo-RT- poor response]; APR [residual ypT3; 5/8 LN positiveDr.Smith/Dr.Pandit]  S/p FOLFOX   # SEP 2019- Pre-sacral fluid/mass- increasing ~ 5-6 cm [increased from dec 2017; also NEW retroperitoneal lymph nodes; inguinal adenopathy]; SEP 2018- left supraclavicular; mediastinal; right hilar-retroperitoneal; pelvic mass/node; bilateral inguinal adenopathy; s/p right Ingiunal LNBx - RECURRENT CA [necrotic;insuff tissue for F-One]  # OCT 8th 2018-FOLFIRI [first cycle 5FU alone]; OCT 22nd FOLFIRI; April 2019- significant PR  # MAY 2019- FOLFIRI q 3W [pt pref]; AUG 2019- CT Progression/ ingiunal LN; Aug 26th Lonsurf  ------------------------------  ---  OCT-NOV 2018-Sepsis x2 sec to UTI [s/p ABx;]  # s/p RT rectal [11/28-last treatment]  # Acute renal insuff [s/p bil PCN]; hematuria- sec to bladder invol [? Contra-indication to avastin]  # LEFT LE DVT [? [facility] Thurner's- Dr.Schneir]; Eliquis.   # CHF/COPD/CKD/ PN sec to [place]  # [facility] One-PDL-1/ TPS- 0% [2012-rectal specimen];12/26-Omniseq- K-RAS MUTATED;MSS; no targets** ---------------------------------------------   DIAGNOSIS: rectal cancer  STAGE:   IV  ; GOALS: palliative  CURRENT/MOST RECENT THERAPY: LONSURF [AUG  26th]      Rectal cancer ([place])    HPI- Donald Townsend, 67 year old male with above history of metastatic rectal cancer who presents to  symptom management clinic for penile discharge and passage of blood clots. He states that symptoms have been ongoing, gradually worsening for several weeks.  Complains of spasming like pain in his pelvis followed by passage of blood clots and milky discharge from his penis.  He describes the pain is severe.  He is taking hydrocodone 5/325 approximately 2-3 times a day with some improvement in pain.  He was evaluated by Dr. Erlene Quan. Imaging consistent with progression. He was put on Mybetriq 25 mg given symptoms which he started yesterday. He is unsure if it is helping.  He is accompanied today by his sister who contributes to history.  He was previously treated for UTI, urine culture positive for MRSA and treated with Bactrim.  He completed cycle 1 of Lonsurf on 01/17/2018.  Fatigue has been worsening despite being off Lonsurf.  Continues to have clear yellow urine in nephrostomy bags.   ECOG FS:3 - Symptomatic, >50% confined to bed  Review of systems- Review of Systems  Constitutional: Positive for malaise/fatigue. Negative for chills, fever and weight loss.  HENT: Negative for congestion, ear discharge, ear pain, sinus pain, sore throat and tinnitus.   Eyes: Negative.   Respiratory: Negative.  Negative for cough, sputum production and shortness of breath.   Cardiovascular: Positive for leg swelling (L > R). Negative for chest pain, palpitations, orthopnea and claudication.  Gastrointestinal: Negative for blood in stool, constipation, diarrhea, heartburn, nausea and vomiting.  Genitourinary:       Per hpi  Musculoskeletal: Negative.   Skin: Negative.   Neurological: Negative for dizziness, tingling, weakness and headaches.  Endo/Heme/Allergies: Negative.   Psychiatric/Behavioral: Negative for depression. The patient has insomnia (d/t bladder spasm/pain). The patient is not nervous/anxious.     Current treatment-  Lonsurf  No Known Allergies  Past Medical History:  Diagnosis Date  . A-fib  ([place])   . Cancer [facility])    Colon  . CHF (congestive heart failure) ([place])   . Diabetes mellitus without complication ([place])   . Dyspnea   . Elevated lipids   . Hypertension   . PVD (peripheral vascular disease) ([place])     Past Surgical History:  Procedure Laterality Date  . COLON RESECTION     with colostomy  . COLONOSCOPY    . CYSTOSCOPY WITH STENT PLACEMENT Bilateral 01/18/2017   Procedure: CYSTOSCOPY WITH STENT PLACEMENT;  Surgeon: [name], MD;  Location: [facility] ORS;  Service: Urology;  Laterality: Bilateral;  . IR FLUORO GUIDE PORT INSERTION RIGHT  01/24/2017  . IR NEPHROSTOGRAM LEFT THRU EXISTING ACCESS  09/18/2017  . IR NEPHROSTOGRAM RIGHT THRU EXISTING ACCESS  09/18/2017  . IR NEPHROSTOMY EXCHANGE LEFT  03/14/2017  . IR NEPHROSTOMY EXCHANGE LEFT  05/03/2017  . IR NEPHROSTOMY EXCHANGE LEFT  06/14/2017  . IR NEPHROSTOMY EXCHANGE LEFT  07/26/2017  . IR NEPHROSTOMY EXCHANGE LEFT  09/18/2017  . IR NEPHROSTOMY EXCHANGE LEFT  11/08/2017  . IR NEPHROSTOMY EXCHANGE LEFT  12/20/2017  . IR NEPHROSTOMY EXCHANGE LEFT  01/24/2018  . IR NEPHROSTOMY EXCHANGE RIGHT  03/14/2017  . IR NEPHROSTOMY EXCHANGE RIGHT  05/03/2017  . IR NEPHROSTOMY EXCHANGE RIGHT  06/14/2017  . IR NEPHROSTOMY EXCHANGE RIGHT  07/26/2017  . IR NEPHROSTOMY EXCHANGE RIGHT  10/04/2017  . IR NEPHROSTOMY EXCHANGE RIGHT  11/08/2017  . IR NEPHROSTOMY EXCHANGE RIGHT  12/20/2017  . IR NEPHROSTOMY EXCHANGE RIGHT  01/24/2018  . IR NEPHROSTOMY PLACEMENT LEFT  01/19/2017  . IR NEPHROSTOMY PLACEMENT RIGHT  01/19/2017  . IVC FILTER REMOVAL N/A 08/28/2016   Procedure: IVC Filter Removal;  Surgeon: Katha Cabal, MD;  Location: [place] [facility];  Service: Cardiovascular;  Laterality: N/A;  . LOWER EXTREMITY VENOGRAPHY Left 12/18/2016   Procedure: Lower Extremity Venography;  Surgeon: Katha Cabal, MD;  Location: [place] [facility];  Service: Cardiovascular;  Laterality: Left;  . PERIPHERAL VASCULAR CATHETERIZATION Left 05/08/2016    Procedure: Lower Extremity Venography;  Surgeon: Katha Cabal, MD;  Location: [place] [facility];  Service: Cardiovascular;  Laterality: Left;    Social History   Socioeconomic History  . Marital status: Divorced    Spouse name: Not on file  . Number of children: Not on file  . Years of education: Not on file  . Highest education level: Not on file  Occupational History  . Not on file  Social Needs  . Financial resource strain: Not on file  . Food insecurity:    Worry: Not on file    Inability: Not on file  . Transportation needs:    Medical: Not on file    Non-medical: Not on file  Tobacco Use  . Smoking status: Never Smoker  . Smokeless tobacco: Never Used  Substance and Sexual Activity  . Alcohol use: No  . Drug use: No  . Sexual activity: Not on file  Lifestyle  . Physical activity:    Days per week: Not on file    Minutes per session: Not on file  . Stress: Not on file  Relationships  . Social connections:    Talks on phone: Not on file    Gets together: Not on file    Attends religious service: Not on file    Active member of club or organization: Not on file  Attends meetings of clubs or organizations: Not on file    Relationship status: Not on file  . Intimate partner violence:    Fear of current or ex partner: Not on file    Emotionally abused: Not on file    Physically abused: Not on file    Forced sexual activity: Not on file  Other Topics Concern  . Not on file  Social History Narrative  . Not on file    Family History  Problem Relation Age of Onset  . Diabetes Mother   . Stroke Mother   . Prostate cancer Brother   . Cancer Sister        Breast cancer  . Kidney cancer Neg Hx   . Bladder Cancer Neg Hx      Current Outpatient Medications:  .  B-D ULTRAFINE III SHORT PEN 31G X 8 MM MISC, , Disp: , Rfl:  .  Continuous Blood Gluc Receiver (FREESTYLE LIBRE READER) DEVI, Use 1 Units as directed. Check CBG's four times daily. Dx: E11.9,  Disp: , Rfl:  .  Continuous Blood Gluc Sensor (FREESTYLE LIBRE SENSOR SYSTEM) MISC, Use 1 Units as directed. Check CBG's four times daily. Dx: E11.9, Disp: , Rfl:  .  DIGOX 250 MCG tablet, Take 0.25 mg by mouth daily. , Disp: , Rfl:  .  ELIQUIS 5 MG TABS tablet, Take 5 mg by mouth every 12 (twelve) hours., Disp: , Rfl: 0 .  furosemide (LASIX) 40 MG tablet, Take 40 mg by mouth daily. , Disp: , Rfl: 0 .  glimepiride (AMARYL) 4 MG tablet, Take 1 tablet by mouth daily., Disp: , Rfl:  .  HYDROcodone-acetaminophen (NORCO/VICODIN) 5-325 MG tablet, Take 1 tablet by mouth every 6 (six) hours as needed for moderate pain., Disp: , Rfl:  .  LANTUS SOLOSTAR 100 UNIT/ML Solostar Pen, Inject 20 Units into the skin at bedtime. , Disp: , Rfl: 0 .  losartan (COZAAR) 100 MG tablet, Take 100 mg by mouth daily., Disp: , Rfl:  .  metoprolol tartrate (LOPRESSOR) 25 MG tablet, Take 25 mg by mouth 2 (two) times daily. , Disp: , Rfl:  .  pravastatin (PRAVACHOL) 20 MG tablet, take 34m by mouth daily at bedtime, Disp: , Rfl:  .  trifluridine-tipiracil (LONSURF) 20-8.19 MG tablet, Take 4 tablets (80 mg of trifluridine total) by mouth 2 (two) times daily after a meal. Take on days 1-5, 8-12. Repeat every 28day (Patient not taking: Reported on 02/17/2018), Disp: 80 tablet, Rfl: 3  Physical exam:  Vitals:   02/17/18 1321 02/17/18 1323  BP:  99/66  Pulse:  91  Resp:  18  Temp:  98 F (36.7 C)  TempSrc:  Tympanic  Weight: 239 lb 9.6 oz (108.7 kg)    GENERAL: Well-nourished well-developed; Alert, no distress and comfortable. Accompanied EYES: no pallor or icterus OROPHARYNX: no thrush or ulceration NECK: supple; no lymph nodes felt LUNGS: Decreased breath sounds auscultation bilaterally. No wheeze or crackles HEART/CVS: regular rate & rhythm and no murmurs; LLE 2+ edema. RLE 1-2+ ABDOMEN: abdomen soft, non-tender and normal bowel sounds.  GU: nephrostomy tubes in place. Enlarged/swollen scrotum. Suprapubic swelling.  Dried blood on penis.  Musculoskeletal: no cyanosis of digits and no clubbing. Ambulates w/o assistance PSYCH: alert & oriented x 3 with fluent speech NEURO: no focal motor/sensory deficits SKIN: no rashes or significant lesions   CMP Latest Ref Rng & Units 02/17/2018  Glucose 70 - 99 mg/dL 176(H)  BUN 8 - 23 mg/dL 23  Creatinine 0.61 - 1.24 mg/dL 1.58(H)  Sodium 135 - 145 mmol/L 133(L)  Potassium 3.5 - 5.1 mmol/L 4.4  Chloride 98 - 111 mmol/L 102  CO2 22 - 32 mmol/L 20(L)  Calcium 8.9 - 10.3 mg/dL 9.1  Total Protein 6.5 - 8.1 g/dL 7.6  Total Bilirubin 0.3 - 1.2 mg/dL 0.7  Alkaline Phos 38 - 126 U/L 87  AST 15 - 41 U/L 19  ALT 0 - 44 U/L 12   CBC Latest Ref Rng & Units 02/17/2018  WBC 4.0 - 10.5 K/uL 18.4(H)  Hemoglobin 13.0 - 17.0 g/dL 10.6(L)  Hematocrit 39.0 - 52.0 % 31.2(L)  Platelets 150 - 400 K/uL 318    No images are attached to the encounter.  Ct Abdomen Pelvis W Contrast  Result Date: 02/13/2018 CLINICAL DATA:  Pelvic pain. Urethral discharge. Locally advanced metastatic rectal carcinoma. EXAM: CT ABDOMEN AND PELVIS WITH CONTRAST TECHNIQUE: Multidetector CT imaging of the abdomen and pelvis was performed using the standard protocol following bolus administration of intravenous contrast. CONTRAST:  112m ISOVUE-300 IOPAMIDOL (ISOVUE-300) INJECTION 61% COMPARISON:  11/25/2017 FINDINGS: Lower Chest: No acute findings. Hepatobiliary: No hepatic masses identified. Gallbladder is unremarkable. Pancreas:  No mass or inflammatory changes. Spleen: Within normal limits in size and appearance. Adrenals/Urinary Tract: Bilateral percutaneous nephrostomy tubes are seen in appropriate position and there is no evidence of hydronephrosis. No renal masses identified. Mild-to-moderate left renal atrophy is stable. Diffuse wall thickening and calcification of the urinary bladder is unchanged in appearance. Stomach/Bowel: Perirectal and presacral soft tissue density with central fluid component  is unchanged, and likely due to post treatment changes. No new or increased soft tissue density seen within the pelvis. Vascular/Lymphatic: New mild retroperitoneal lymphadenopathy is seen in the left paraaortic and aortocaval spaces, with largest index lymph node in the left paraaortic region measuring 1.5 cm on image 37/2. Mild bilateral inguinal lymphadenopathy is mildly increased. Index lymph node in the right inguinal region measures 16 mm on image 82/2 compared to 12 mm previously. Index lymph node in left inguinal region measures 14 mm on image 87/2 compared to 8 mm previously. Reproductive:  No mass or other significant abnormality. Other:  None. Musculoskeletal:  No suspicious bone lesions identified. IMPRESSION: Bilateral percutaneous nephrostomy tubes in appropriate position. No evidence of hydronephrosis. Increased mild abdominal retroperitoneal and bilateral inguinal lymphadenopathy, suspicious for metastatic disease. Stable perirectal and presacral soft tissue density and diffuse bladder wall thickening. Electronically Signed   By: JEarle GellM.D.   On: 02/13/2018 18:32   Ir Nephrostomy Exchange Left  Result Date: 01/24/2018 INDICATION: 67year old with history of rectal cancer and placement of nephrostomy tubes on 01/19/2017. Patient has known occlusion of the left ureter and did not tolerate right nephrostomy tube capping. Patient presents for routine bilateral nephrostomy tube exchange. EXAM: EXCHANGE BILATERAL NEPHROSTOMY TUBES WITH FLUOROSCOPY Physician: AStephan Minister HAnselm Pancoast MD COMPARISON:  None. MEDICATIONS: None ANESTHESIA/SEDATION: None CONTRAST:  149mISOVUE-300 IOPAMIDOL (ISOVUE-300) INJECTION 61% - administered into the collecting system(s) FLUOROSCOPY TIME:  Fluoroscopy Time: 42 seconds, 19 mGy COMPLICATIONS: None immediate. PROCEDURE: The procedure was explained to the patient. The risks and benefits of the procedure were discussed and the patient's questions were addressed. Informed consent  was obtained from the patient. Patient was placed prone and bilateral catheters were prepped and draped in sterile fashion. Maximal barrier sterile technique was utilized including caps, mask, sterile gowns, sterile gloves, sterile drape, hand hygiene and skin antiseptic. Contrast was injected through the left nephrostomy tube. The retention suture  was removed. The catheter was cut and removed over a Bentson wire. A new 10.2 [place] multipurpose drain was reconstituted in left renal pelvis. Catheter was injected with contrast to confirm placement. Skin was anesthetized with 1% lidocaine and catheter was sutured to skin. Contrast injected through the right nephrostomy tube demonstrated placement in the collecting system. The catheter was cut and removed over wire. New 10.2 French drain was advanced over the wire and reconstituted in the right renal pelvis. Contrast injection confirmed placement in the renal pelvis. Skin was anesthetized with 1% lidocaine and catheter was sutured to skin. Fluoroscopic images were taken and saved for this procedure. FINDINGS: Nephrostomy tubes are in the renal pelvis bilaterally. IMPRESSION: Successful exchange of bilateral nephrostomy tubes with fluoroscopy. Electronically Signed   By: [name] M.D.   On: 01/24/2018 14:54   Ir Nephrostomy Exchange Right  Result Date: 01/24/2018 INDICATION: 67 year old with history of rectal cancer and placement of nephrostomy tubes on 01/19/2017. Patient has known occlusion of the left ureter and did not tolerate right nephrostomy tube capping. Patient presents for routine bilateral nephrostomy tube exchange. EXAM: EXCHANGE BILATERAL NEPHROSTOMY TUBES WITH FLUOROSCOPY Physician: Stephan Minister. Anselm Pancoast, MD COMPARISON:  None. MEDICATIONS: None ANESTHESIA/SEDATION: None CONTRAST:  20m ISOVUE-300 IOPAMIDOL (ISOVUE-300) INJECTION 61% - administered into the collecting system(s) FLUOROSCOPY TIME:  Fluoroscopy Time: 42 seconds, 19 mGy COMPLICATIONS: None  immediate. PROCEDURE: The procedure was explained to the patient. The risks and benefits of the procedure were discussed and the patient's questions were addressed. Informed consent was obtained from the patient. Patient was placed prone and bilateral catheters were prepped and draped in sterile fashion. Maximal barrier sterile technique was utilized including caps, mask, sterile gowns, sterile gloves, sterile drape, hand hygiene and skin antiseptic. Contrast was injected through the left nephrostomy tube. The retention suture was removed. The catheter was cut and removed over a Bentson wire. A new 10.2 [place] drain was reconstituted in left renal pelvis. Catheter was injected with contrast to confirm placement. Skin was anesthetized with 1% lidocaine and catheter was sutured to skin. Contrast injected through the right nephrostomy tube demonstrated placement in the collecting system. The catheter was cut and removed over wire. New 10.2 French drain was advanced over the wire and reconstituted in the right renal pelvis. Contrast injection confirmed placement in the renal pelvis. Skin was anesthetized with 1% lidocaine and catheter was sutured to skin. Fluoroscopic images were taken and saved for this procedure. FINDINGS: Nephrostomy tubes are in the renal pelvis bilaterally. IMPRESSION: Successful exchange of bilateral nephrostomy tubes with fluoroscopy. Electronically Signed   By: AMarkus DaftM.D.   On: 01/24/2018 14:54    Assessment and plan- Patient is a 67y.o. male diagnosed with metastatic rectal cancer who presents to symptom management clinic for penile discharge and pelvic pain.  1.  Recurrent rectal cancer stage IV-currently on Lonsurf s/p cycle 1 on 9/20.  Recent CT scan showed slightly increase in inguinal adenopathy, stable presacral fluid collection.  Tumor marker is rising.  Given symptoms (see below) discussed with Dr. BRogue Bussingwho independently assessed patient and discussed goals  of care. Treatment options were discussed in detail by Dr. BRogue Bussingbut overall patient has had poor tolerance to therapy and is not currently interested in additional therapy.    2. Bladder Spasms- Continue Myrbetriq per Urology. Pain medication dosage increased to Norco 10-325 q6h prn. May need to alternate with tylenol. May consider other palliative measures for painful bladder spasms such as: phenazopyridine, diazepam, or  anti-inflammatories. Suspect he will need adjustment of systemic pain medications.   3. Penile discharge- previously improved slightly with antibiotics.  WBC 18,000 (malignancy versus infection).  Discussed with Dr. Rogue Bussing who recommends treatment with bactrim ds BID x 1 week but likely symptoms r/t disease.   4. Scrotal swelling- discussed management for comfort including scrotal slings and elevation. Patient states not significantly uncomfortable today but will consider in future.   5. 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 Continue eliquis. Continue to monitor and f/u w/ Dr. Rogue Bussing.   6. Goals of Care- I introduced palliative care as specialized medical care for people living with serious illness. It focuses on providing relief from the symptoms and stress of a serious illness. The goal is to improve quality of life for both the patient and the family.  We discussed the differences in home-based versus outpatient based palliative care services.  Patient is interested in outpatient palliative care. Will refer for goals of care/symptom management/advance care planning with Billey Chang, NP.   rtc in 1 week for re-evaluation with Dr. Rogue Bussing and goals of care with Billey Chang, NP.   Visit Diagnosis 1. Rectal cancer ([place])   2. Painful bladder spasm   3. Neoplasm related pain   4. Goals of care, counseling/discussion     Patient expressed understanding and was in agreement with this plan. He also  understands that He can call clinic at any time with any questions, concerns, or complaints.   Thank you for allowing me to participate in the care of this very pleasant patient.   Beckey Rutter, DNP, AGNP-C [place] [facility] [place] (work cell) [phone] (office)  CC: Dr. Forestine Chute, NP

## 2018-02-17 NOTE — Telephone Encounter (Signed)
Patient came to symptom management clinic for evaluation of ongoing bloody penile discharge/worsening cramps/worsening pain in the pelvis.  Recent CT scan showed slightly increasing inguinal adenopathy stable presacral fluid collection.  Discussed with Dr. Erlene Quan.  #Rising tumor marker/worsening symptoms/I suspect patient is clinical progression.  Patient has poor tolerance with therapy/is not too inclined with further therapy.  Patient's next treatment option Would be FOLFOX.  Patient reluctant with oxaliplatin based therapy because of neuropathy.  Patient not a candidate for Avastin/ rego given the concerns for colovesical fistula.  Patient not interested with radiation evaluation.  #Recommend increasing the dose of pain medication.  Continue Myrbetriq.  Palliative care evaluation.  #White count 18,000-infection versus malignancy; recommend Bactrim for 1 more week.  Discussed with nurse practitioner discussed with patient and family.  Unfortunately poor prognosis.  #Follow-up in 1 week as planned.

## 2018-02-17 NOTE — Telephone Encounter (Addendum)
Sister called requesting something be done for her father as he is in a LOT of pain taking more medicine than prescribed and that he is having bleeding from his penis. Please advise

## 2018-02-17 NOTE — Telephone Encounter (Signed)
We can see him today in [facility].

## 2018-02-17 NOTE — Telephone Encounter (Signed)
Patient reports that he is having pain in pelvis and taking Norco 5/325 mg 3 per day. Needs something stronger, Having difficulty sitting due to pain. He is passing bright red blood and clots. Dr Erlene Quan has deferred to Dr Rogue Bussing per patient. He also reports that he has swelling in his left leg only.

## 2018-02-17 NOTE — Progress Notes (Signed)
Patient here today for add on visit regarding worsening pain and hematuria. Patient reports 10/10 groin, abdominal and back pain, has been taking Hydrocodone without relief. Patient also reports he is passing blood clots with urination, patient also has nephrostomy tubes in place.

## 2018-02-24 ENCOUNTER — Inpatient Hospital Stay (HOSPITAL_BASED_OUTPATIENT_CLINIC_OR_DEPARTMENT_OTHER): Payer: Medicare Other | Admitting: Internal Medicine

## 2018-02-24 ENCOUNTER — Telehealth: Payer: Self-pay | Admitting: Internal Medicine

## 2018-02-24 ENCOUNTER — Encounter: Payer: Self-pay | Admitting: Internal Medicine

## 2018-02-24 ENCOUNTER — Inpatient Hospital Stay: Payer: Medicare Other

## 2018-02-24 VITALS — BP 120/85 | HR 101 | Temp 100.3°F | Resp 18 | Wt 236.0 lb

## 2018-02-24 DIAGNOSIS — Z794 Long term (current) use of insulin: Secondary | ICD-10-CM | POA: Diagnosis not present

## 2018-02-24 DIAGNOSIS — N136 Pyonephrosis: Secondary | ICD-10-CM

## 2018-02-24 DIAGNOSIS — C20 Malignant neoplasm of rectum: Secondary | ICD-10-CM

## 2018-02-24 DIAGNOSIS — R530 Neoplastic (malignant) related fatigue: Secondary | ICD-10-CM

## 2018-02-24 DIAGNOSIS — M549 Dorsalgia, unspecified: Secondary | ICD-10-CM

## 2018-02-24 DIAGNOSIS — Z79899 Other long term (current) drug therapy: Secondary | ICD-10-CM | POA: Diagnosis not present

## 2018-02-24 DIAGNOSIS — G893 Neoplasm related pain (acute) (chronic): Secondary | ICD-10-CM | POA: Diagnosis not present

## 2018-02-24 DIAGNOSIS — E119 Type 2 diabetes mellitus without complications: Secondary | ICD-10-CM

## 2018-02-24 DIAGNOSIS — N189 Chronic kidney disease, unspecified: Secondary | ICD-10-CM

## 2018-02-24 DIAGNOSIS — I130 Hypertensive heart and chronic kidney disease with heart failure and stage 1 through stage 4 chronic kidney disease, or unspecified chronic kidney disease: Secondary | ICD-10-CM

## 2018-02-24 DIAGNOSIS — I4891 Unspecified atrial fibrillation: Secondary | ICD-10-CM | POA: Diagnosis not present

## 2018-02-24 DIAGNOSIS — R301 Vesical tenesmus: Secondary | ICD-10-CM | POA: Insufficient documentation

## 2018-02-24 DIAGNOSIS — Z86718 Personal history of other venous thrombosis and embolism: Secondary | ICD-10-CM

## 2018-02-24 DIAGNOSIS — Z87440 Personal history of urinary (tract) infections: Secondary | ICD-10-CM

## 2018-02-24 DIAGNOSIS — M7989 Other specified soft tissue disorders: Secondary | ICD-10-CM | POA: Diagnosis not present

## 2018-02-24 LAB — COMPREHENSIVE METABOLIC PANEL
ALT: 20 U/L (ref 0–44)
AST: 22 U/L (ref 15–41)
Albumin: 3 g/dL — ABNORMAL LOW (ref 3.5–5.0)
Alkaline Phosphatase: 110 U/L (ref 38–126)
Anion gap: 10 (ref 5–15)
BILIRUBIN TOTAL: 0.4 mg/dL (ref 0.3–1.2)
BUN: 24 mg/dL — AB (ref 8–23)
CO2: 20 mmol/L — ABNORMAL LOW (ref 22–32)
Calcium: 9.1 mg/dL (ref 8.9–10.3)
Chloride: 104 mmol/L (ref 98–111)
Creatinine, Ser: 1.92 mg/dL — ABNORMAL HIGH (ref 0.61–1.24)
GFR calc Af Amer: 40 mL/min — ABNORMAL LOW (ref >60)
GFR, EST NON AFRICAN AMERICAN: 34 mL/min — AB (ref >60)
Glucose, Bld: 191 mg/dL — ABNORMAL HIGH (ref 70–99)
Potassium: 4.4 mmol/L (ref 3.5–5.1)
Sodium: 134 mmol/L — ABNORMAL LOW (ref 135–145)
TOTAL PROTEIN: 7.7 g/dL (ref 6.5–8.1)

## 2018-02-24 LAB — CBC WITH DIFFERENTIAL/PLATELET
Abs Immature Granulocytes: 0.07 10*3/uL (ref 0.00–0.07)
BASOS PCT: 1 %
Basophils Absolute: 0.1 10*3/uL (ref 0.0–0.1)
EOS PCT: 10 %
Eosinophils Absolute: 1 10*3/uL — ABNORMAL HIGH (ref 0.0–0.5)
HCT: 31.3 % — ABNORMAL LOW (ref 39.0–52.0)
Hemoglobin: 10.3 g/dL — ABNORMAL LOW (ref 13.0–17.0)
Immature Granulocytes: 1 %
Lymphocytes Relative: 12 %
Lymphs Abs: 1.3 10*3/uL (ref 0.7–4.0)
MCH: 27.7 pg (ref 26.0–34.0)
MCHC: 32.9 g/dL (ref 30.0–36.0)
MCV: 84.1 fL (ref 80.0–100.0)
MONO ABS: 0.7 10*3/uL (ref 0.1–1.0)
Monocytes Relative: 7 %
Neutro Abs: 7.1 10*3/uL (ref 1.7–7.7)
Neutrophils Relative %: 69 %
PLATELETS: 338 10*3/uL (ref 150–400)
RBC: 3.72 MIL/uL — AB (ref 4.22–5.81)
RDW: 14.6 % (ref 11.5–15.5)
WBC: 10.2 10*3/uL (ref 4.0–10.5)
nRBC: 0 % (ref 0.0–0.2)

## 2018-02-24 NOTE — Progress Notes (Signed)
[place] OFFICE PROGRESS NOTE  Patient Care Team: Dion Body, MD as PCP - General (Family Medicine) Clent Jacks, RN as Registered Nurse Cammie Sickle, MD as Medical Oncologist (Medical Oncology)  Cancer Staging No matching staging information was found for the patient.   Oncology History   # 2012- RECTO-SIGMOID-poorly diff STAGE III CA [s/p neo-adj chemo-RT- poor response]; APR [residual ypT3; 5/8 LN positiveDr.Smith/Dr.Pandit]  S/p FOLFOX   # SEP 2019- Pre-sacral fluid/mass- increasing ~ 5-6 cm [increased from dec 2017; also NEW retroperitoneal lymph nodes; inguinal adenopathy]; SEP 2018- left supraclavicular; mediastinal; right hilar-retroperitoneal; pelvic mass/node; bilateral inguinal adenopathy; s/p right Ingiunal LNBx - RECURRENT CA [necrotic;insuff tissue for F-One]  # OCT 8th 2018-FOLFIRI [first cycle 5FU alone]; OCT 22nd FOLFIRI; April 2019- significant PR  # MAY 2019- FOLFIRI q 3W [pt pref]; AUG 2019- CT Progression/ ingiunal LN; Aug 26th Lonsurf; STOPPED sep 2019- sec to into;/OCT 17th CT-Progression  ------------------------------  ---  OCT-NOV 2018-Sepsis x2 sec to UTI [s/p ABx;]  # s/p RT rectal [11/28-last treatment]  # Acute renal insuff [s/p bil PCN]; hematuria- sec to bladder invol [? Contra-indication to avastin]  # LEFT LE DVT [? [facility]- Dr.Schneir]; Eliquis.   # CHF/COPD/CKD/ PN sec to [place]  # [facility]-PDL-1/ TPS- 0% [2012-rectal specimen];12/26-Omniseq- K-RAS MUTATED;MSS; no targets** ---------------------------------------------   DIAGNOSIS: rectal cancer  STAGE:   IV  ; GOALS: palliative  CURRENT/MOST RECENT THERAPY: LONSURF [AUG  26th]      Rectal cancer ([place])      INTERVAL HISTORY:  [place] 67 y.o.  male pleasant patient above history of metastatic rectal cancer most recently on Lonsurf is here for follow-up.  Patient is currently on Bactrim for recent MRSA positive urine  culture.   He continues to have subjective feeling of warmth.  Continues to have bloody penile discharge.  Continues to have swelling on the scrotum suprapubic area.  Positive for bilateral nephrostomy tubes.  Chronic back pain/rectal pressure patient not taking hydrocodone as recommended given the need for driving.  Review of Systems  Constitutional: Positive for malaise/fatigue. Negative for chills, diaphoresis, fever and weight loss.  HENT: Negative for nosebleeds and sore throat.   Eyes: Negative for double vision.  Respiratory: Negative for cough, hemoptysis, sputum production, shortness of breath and wheezing.   Cardiovascular: Negative for chest pain, palpitations, orthopnea and leg swelling.  Gastrointestinal: Negative for abdominal pain, blood in stool, constipation, diarrhea, heartburn, melena, nausea and vomiting.  Genitourinary: Positive for hematuria. Negative for dysuria, frequency and urgency.  Musculoskeletal: Positive for back pain and joint pain.  Skin: Negative.  Negative for itching and rash.  Neurological: Negative for dizziness, tingling, focal weakness, weakness and headaches.  Endo/Heme/Allergies: Does not bruise/bleed easily.  Psychiatric/Behavioral: Negative for depression. The patient is not nervous/anxious and does not have insomnia.       PAST MEDICAL HISTORY :  Past Medical History:  Diagnosis Date  . A-fib ([place])   . [facility]    Colon  . CHF (congestive heart failure) ([place])   . Diabetes mellitus without complication ([place])   . Dyspnea   . Elevated lipids   . Hypertension   . PVD (peripheral vascular disease) ([place])     PAST SURGICAL HISTORY :   Past Surgical History:  Procedure Laterality Date  . COLON RESECTION     with colostomy  . COLONOSCOPY    . CYSTOSCOPY WITH STENT PLACEMENT Bilateral 01/18/2017   Procedure: CYSTOSCOPY WITH STENT PLACEMENT;  Surgeon: [name], MD;  Location: [facility] ORS;  Service: Urology;  Laterality:  Bilateral;  . IR FLUORO GUIDE PORT INSERTION RIGHT  01/24/2017  . IR NEPHROSTOGRAM LEFT THRU EXISTING ACCESS  09/18/2017  . IR NEPHROSTOGRAM RIGHT THRU EXISTING ACCESS  09/18/2017  . IR NEPHROSTOMY EXCHANGE LEFT  03/14/2017  . IR NEPHROSTOMY EXCHANGE LEFT  05/03/2017  . IR NEPHROSTOMY EXCHANGE LEFT  06/14/2017  . IR NEPHROSTOMY EXCHANGE LEFT  07/26/2017  . IR NEPHROSTOMY EXCHANGE LEFT  09/18/2017  . IR NEPHROSTOMY EXCHANGE LEFT  11/08/2017  . IR NEPHROSTOMY EXCHANGE LEFT  12/20/2017  . IR NEPHROSTOMY EXCHANGE LEFT  01/24/2018  . IR NEPHROSTOMY EXCHANGE RIGHT  03/14/2017  . IR NEPHROSTOMY EXCHANGE RIGHT  05/03/2017  . IR NEPHROSTOMY EXCHANGE RIGHT  06/14/2017  . IR NEPHROSTOMY EXCHANGE RIGHT  07/26/2017  . IR NEPHROSTOMY EXCHANGE RIGHT  10/04/2017  . IR NEPHROSTOMY EXCHANGE RIGHT  11/08/2017  . IR NEPHROSTOMY EXCHANGE RIGHT  12/20/2017  . IR NEPHROSTOMY EXCHANGE RIGHT  01/24/2018  . IR NEPHROSTOMY PLACEMENT LEFT  01/19/2017  . IR NEPHROSTOMY PLACEMENT RIGHT  01/19/2017  . IVC FILTER REMOVAL N/A 08/28/2016   Procedure: IVC Filter Removal;  Surgeon: Katha Cabal, MD;  Location: [facility] [facility];  Service: Cardiovascular;  Laterality: N/A;  . LOWER EXTREMITY VENOGRAPHY Left 12/18/2016   Procedure: Lower Extremity Venography;  Surgeon: Katha Cabal, MD;  Location: [place] [facility];  Service: Cardiovascular;  Laterality: Left;  . PERIPHERAL VASCULAR CATHETERIZATION Left 05/08/2016   Procedure: Lower Extremity Venography;  Surgeon: Katha Cabal, MD;  Location: [facility];  Service: Cardiovascular;  Laterality: Left;    FAMILY HISTORY :   Family History  Problem Relation Age of Onset  . Diabetes Mother   . Stroke Mother   . Prostate cancer Brother   . Cancer Sister        Breast cancer  . Kidney cancer Neg Hx   . Bladder Cancer Neg Hx     SOCIAL HISTORY:   Social History   Tobacco Use  . Smoking status: Never Smoker  . Smokeless tobacco: Never Used  Substance Use Topics   . Alcohol use: No  . Drug use: No    ALLERGIES:  has No Known Allergies.  MEDICATIONS:  Current Outpatient Medications  Medication Sig Dispense Refill  . B-D ULTRAFINE III SHORT PEN 31G X 8 MM MISC     . Continuous Blood Gluc Receiver (FREESTYLE LIBRE READER) DEVI Use 1 Units as directed. Check CBG's four times daily. Dx: E11.9    . Continuous Blood Gluc Sensor (FREESTYLE LIBRE SENSOR SYSTEM) MISC Use 1 Units as directed. Check CBG's four times daily. Dx: E11.9    . DIGOX 250 MCG tablet Take 0.25 mg by mouth daily.     [name] ELIQUIS 5 MG TABS tablet Take 5 mg by mouth every 12 (twelve) hours.  0  . furosemide (LASIX) 40 MG tablet Take 40 mg by mouth daily.   0  . glimepiride (AMARYL) 4 MG tablet Take 1 tablet by mouth daily.    [name] HYDROcodone-acetaminophen (NORCO) 10-325 MG tablet Take 1 tablet by mouth every 6 (six) hours as needed for moderate pain or severe pain. 60 tablet 0  . LANTUS SOLOSTAR 100 UNIT/ML Solostar Pen Inject 20 Units into the skin at bedtime.   0  . losartan (COZAAR) 100 MG tablet Take 100 mg by mouth daily.    . metoprolol tartrate (LOPRESSOR) 25 MG tablet Take  25 mg by mouth 2 (two) times daily.     . pravastatin (PRAVACHOL) 20 MG tablet take 27m by mouth daily at bedtime    . sulfamethoxazole-trimethoprim (BACTRIM DS,SEPTRA DS) 800-160 MG tablet Take 1 tablet by mouth 2 (two) times daily for 7 days. 14 tablet 0  . trifluridine-tipiracil (LONSURF) 20-8.19 MG tablet Take 4 tablets (80 mg of trifluridine total) by mouth 2 (two) times daily after a meal. Take on days 1-5, 8-12. Repeat every 28day (Patient not taking: Reported on 02/17/2018) 80 tablet 3   No current facility-administered medications for this visit.     PHYSICAL EXAMINATION: ECOG PERFORMANCE STATUS: 1 - Symptomatic but completely ambulatory  BP 120/85 (BP Location: Left Arm, Patient Position: Sitting)   Pulse (!) 101   Temp 100.3 F (37.9 C) (Tympanic)   Resp 18   Wt 236 lb (107 kg)   SpO2 98%    BMI 33.86 kg/m   Filed Weights   02/24/18 1441  Weight: 236 lb (107 kg)    Physical Exam  Constitutional: He is oriented to person, place, and time and well-developed, well-nourished, and in no distress.  Is alone.  Is walking himself.  HENT:  Head: Normocephalic and atraumatic.  Mouth/Throat: Oropharynx is clear and moist. No oropharyngeal exudate.  Eyes: Pupils are equal, round, and reactive to light.  Neck: Normal range of motion. Neck supple.  Cardiovascular: Normal rate and regular rhythm.  Pulmonary/Chest: No respiratory distress. He has no wheezes.  Decreased breath sounds at the bases bilaterally.  Abdominal: Soft. Bowel sounds are normal. He exhibits no distension and no mass. There is no tenderness. There is no rebound and no guarding.  Bilateral nephrostomy tubes.  Scrotal/penile/suprapubic swelling noted.  Musculoskeletal: Normal range of motion. He exhibits no edema or tenderness.  Neurological: He is alert and oriented to person, place, and time.  Skin: Skin is warm.  Psychiatric: Affect normal.       LABORATORY DATA:  I have reviewed the data as listed    Component Value Date/Time   NA 134 (L) 02/24/2018 1407   NA 139 12/17/2012 0608   K 4.4 02/24/2018 1407   K 3.8 12/17/2012 0608   CL 104 02/24/2018 1407   CL 108 (H) 12/17/2012 0608   CO2 20 (L) 02/24/2018 1407   CO2 26 12/17/2012 0608   GLUCOSE 191 (H) 02/24/2018 1407   GLUCOSE 217 (H) 12/17/2012 0608   BUN 24 (H) 02/24/2018 1407   BUN 21 01/01/2017 1621   BUN 15 12/17/2012 0608   CREATININE 1.92 (H) 02/24/2018 1407   CREATININE 0.84 12/17/2012 0608   CALCIUM 9.1 02/24/2018 1407   CALCIUM 8.2 (L) 12/17/2012 0608   PROT 7.7 02/24/2018 1407   PROT 5.8 (L) 12/17/2012 0608   ALBUMIN 3.0 (L) 02/24/2018 1407   ALBUMIN 2.9 (L) 12/17/2012 0608   AST 22 02/24/2018 1407   AST 16 12/17/2012 0608   ALT 20 02/24/2018 1407   ALT 23 12/17/2012 0608   ALKPHOS 110 02/24/2018 1407   ALKPHOS 78 12/17/2012  0608   BILITOT 0.4 02/24/2018 1407   BILITOT 0.7 12/17/2012 0608   GFRNONAA 34 (L) 02/24/2018 1407   GFRNONAA >60 12/17/2012 0608   GFRAA 40 (L) 02/24/2018 1407   GFRAA >60 12/17/2012 0608    No results found for: SPEP, UPEP  Lab Results  Component Value Date   WBC 10.2 02/24/2018   NEUTROABS 7.1 02/24/2018   HGB 10.3 (L) 02/24/2018   HCT 31.3 (L) 02/24/2018  MCV 84.1 02/24/2018   PLT 338 02/24/2018      Chemistry      Component Value Date/Time   NA 134 (L) 02/24/2018 1407   NA 139 12/17/2012 0608   K 4.4 02/24/2018 1407   K 3.8 12/17/2012 0608   CL 104 02/24/2018 1407   CL 108 (H) 12/17/2012 0608   CO2 20 (L) 02/24/2018 1407   CO2 26 12/17/2012 0608   BUN 24 (H) 02/24/2018 1407   BUN 21 01/01/2017 1621   BUN 15 12/17/2012 0608   CREATININE 1.92 (H) 02/24/2018 1407   CREATININE 0.84 12/17/2012 0608      Component Value Date/Time   CALCIUM 9.1 02/24/2018 1407   CALCIUM 8.2 (L) 12/17/2012 0608   ALKPHOS 110 02/24/2018 1407   ALKPHOS 78 12/17/2012 0608   AST 22 02/24/2018 1407   AST 16 12/17/2012 0608   ALT 20 02/24/2018 1407   ALT 23 12/17/2012 0608   BILITOT 0.4 02/24/2018 1407   BILITOT 0.7 12/17/2012 0608       RADIOGRAPHIC STUDIES: I have personally reviewed the radiological images as listed and agreed with the findings in the report. No results found.   ASSESSMENT & PLAN:  Rectal cancer ([place]) # RECURRENT RECTAL CANCER STAGE IV: Most recently on Lonsurf; February 13, 2018 CT scan shows progression of disease.  #Hold for the Lonsurf given poor tolerance/patient's concerns.   #Patient not keen on restarting further chemotherapy-as the next therapy would be FOLFOX; patient has neuropathy.  Patient is not a good candidate for rego/avastin concerns/risks of colovesical fistula.  Discussed at length treatment options are limited.  Patient understands.  #Low-grade fever-currently on Bactrim improved white count.  Unfortunately patient has high risk of  infections.  # Chronic kidney disease/ bilateral hydronephrosis; creatinine stable 1.5-1.9.  Currently right-sided percutaneous nephrostomy tube; status post tubes exchange.   #Question recent UTI/MRSA currently on Bactrim; as per urology.  # Back pain- secondary to recurrent pelvic malignancy-recommend continued hydrocodone.  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  Stable  #Discussed the prognosis overall poor-recommend discussion regarding goals of care/CODE STATUS etc With palliative care nurse practitioner.  I offered to talk to patient's daughter is in agreement.  # Follow up in 3 weeks [poor tolerance]/labs-CBCB/CMP/CEA; follow-up with palliative care in 1 week.   No orders of the defined types were placed in this encounter.  All questions were answered. The patient knows to call the clinic with any problems, questions or concerns.      Cammie Sickle, MD 02/24/2018 3:59 PM

## 2018-02-24 NOTE — Telephone Encounter (Signed)
error 

## 2018-02-24 NOTE — Telephone Encounter (Signed)
LVM for daughter to call me back to discuss pt's over all plan of care. Dr.B

## 2018-02-24 NOTE — Assessment & Plan Note (Addendum)
#   RECURRENT RECTAL CANCER STAGE IV: Most recently on Lonsurf; February 13, 2018 CT scan shows progression of disease.  #Hold for the Lonsurf given poor tolerance/patient's concerns.   #Patient not keen on restarting further chemotherapy-as the next therapy would be FOLFOX; patient has neuropathy.  Patient is not a good candidate for rego/avastin concerns/risks of colovesical fistula.  Discussed at length treatment options are limited.  Patient understands.  #Low-grade fever-currently on Bactrim improved white count.  Unfortunately patient has high risk of infections.  # Chronic kidney disease/ bilateral hydronephrosis; creatinine stable 1.5-1.9.  Currently right-sided percutaneous nephrostomy tube; status post tubes exchange.   #Question recent UTI/MRSA currently on Bactrim; as per urology.  # Back pain- secondary to recurrent pelvic malignancy-recommend continued hydrocodone.  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  Stable  #Discussed the prognosis overall poor-recommend discussion regarding goals of care/CODE STATUS etc With palliative care nurse practitioner.  I offered to talk to patient's daughter is in agreement.  # Follow up in 3 weeks [poor tolerance]/labs-CBCB/CMP/CEA; follow-up with palliative care in 1 week.

## 2018-02-25 ENCOUNTER — Other Ambulatory Visit: Payer: Self-pay | Admitting: Radiology

## 2018-02-25 ENCOUNTER — Telehealth: Payer: Self-pay | Admitting: *Deleted

## 2018-02-25 DIAGNOSIS — N1330 Unspecified hydronephrosis: Secondary | ICD-10-CM

## 2018-02-25 DIAGNOSIS — N135 Crossing vessel and stricture of ureter without hydronephrosis: Secondary | ICD-10-CM

## 2018-02-25 NOTE — Telephone Encounter (Signed)
Daughter called back from message yesterday and states she will be available after 4:30 today . Please call her back then [phone]

## 2018-02-26 ENCOUNTER — Encounter: Payer: Self-pay | Admitting: Radiology

## 2018-02-26 ENCOUNTER — Other Ambulatory Visit: Payer: Self-pay | Admitting: Radiology

## 2018-02-26 ENCOUNTER — Telehealth: Payer: Self-pay | Admitting: Internal Medicine

## 2018-02-26 DIAGNOSIS — N135 Crossing vessel and stricture of ureter without hydronephrosis: Secondary | ICD-10-CM

## 2018-02-26 MED ORDER — CIPROFLOXACIN HCL 500 MG PO TABS: 250.0000 mg | ORAL_TABLET | Freq: Two times a day (BID) | ORAL | 0 refills | Status: DC

## 2018-02-26 NOTE — Telephone Encounter (Signed)
Left VM for daughter to call us back.

## 2018-02-28 ENCOUNTER — Encounter: Payer: Self-pay | Admitting: Internal Medicine

## 2018-02-28 ENCOUNTER — Telehealth: Payer: Self-pay | Admitting: Internal Medicine

## 2018-02-28 ENCOUNTER — Encounter: Payer: Self-pay | Admitting: Urology

## 2018-02-28 MED ORDER — SULFAMETHOXAZOLE-TRIMETHOPRIM 800-160 MG PO TABS: 1.0000 | ORAL_TABLET | Freq: Once | ORAL | 1 refills | Status: AC

## 2018-02-28 NOTE — Telephone Encounter (Signed)
Recommend bactrim DS once a day for infection prophylaxis. Prescription sent.

## 2018-03-03 ENCOUNTER — Telehealth: Payer: Self-pay

## 2018-03-03 MED ORDER — MIRABEGRON ER 50 MG PO TB24: 50.0000 mg | ORAL_TABLET | Freq: Every day | ORAL | 3 refills | Status: DC

## 2018-03-03 NOTE — Telephone Encounter (Signed)
Myrbetriq 50mg  ordered per Dr Erlene Quan.

## 2018-03-06 ENCOUNTER — Inpatient Hospital Stay: Payer: Medicare Other | Attending: Hospice and Palliative Medicine | Admitting: Hospice and Palliative Medicine

## 2018-03-06 VITALS — BP 127/68 | HR 86 | Resp 16

## 2018-03-06 DIAGNOSIS — Z86718 Personal history of other venous thrombosis and embolism: Secondary | ICD-10-CM | POA: Diagnosis not present

## 2018-03-06 DIAGNOSIS — Z515 Encounter for palliative care: Secondary | ICD-10-CM | POA: Insufficient documentation

## 2018-03-06 DIAGNOSIS — Z66 Do not resuscitate: Secondary | ICD-10-CM | POA: Insufficient documentation

## 2018-03-06 DIAGNOSIS — R590 Localized enlarged lymph nodes: Secondary | ICD-10-CM | POA: Insufficient documentation

## 2018-03-06 DIAGNOSIS — Z7901 Long term (current) use of anticoagulants: Secondary | ICD-10-CM | POA: Insufficient documentation

## 2018-03-06 DIAGNOSIS — Z9221 Personal history of antineoplastic chemotherapy: Secondary | ICD-10-CM | POA: Diagnosis not present

## 2018-03-06 DIAGNOSIS — R630 Anorexia: Secondary | ICD-10-CM | POA: Diagnosis not present

## 2018-03-06 DIAGNOSIS — N184 Chronic kidney disease, stage 4 (severe): Secondary | ICD-10-CM | POA: Diagnosis not present

## 2018-03-06 DIAGNOSIS — C20 Malignant neoplasm of rectum: Secondary | ICD-10-CM | POA: Insufficient documentation

## 2018-03-06 DIAGNOSIS — I4891 Unspecified atrial fibrillation: Secondary | ICD-10-CM | POA: Diagnosis not present

## 2018-03-06 DIAGNOSIS — E119 Type 2 diabetes mellitus without complications: Secondary | ICD-10-CM | POA: Insufficient documentation

## 2018-03-06 DIAGNOSIS — Z7189 Other specified counseling: Secondary | ICD-10-CM

## 2018-03-06 DIAGNOSIS — I509 Heart failure, unspecified: Secondary | ICD-10-CM | POA: Diagnosis not present

## 2018-03-06 DIAGNOSIS — R369 Urethral discharge, unspecified: Secondary | ICD-10-CM | POA: Insufficient documentation

## 2018-03-06 DIAGNOSIS — N1330 Unspecified hydronephrosis: Secondary | ICD-10-CM | POA: Insufficient documentation

## 2018-03-06 NOTE — Progress Notes (Signed)
Donald Townsend  [phone] [phone]   Name: Donald Townsend Date: 03/06/2018 MRN: [id number]  DOB: Apr 28, 1951  Patient Care Team: Dion Body, MD as PCP - General (Family Medicine) Clent Jacks, RN as Registered Nurse Cammie Sickle, MD as Medical Oncologist (Medical Oncology)    REASON FOR CONSULTATION: Palliative Care consult requested for this 67 y.o. 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  He was most recently treated with Lonsurf but has decided to forego future treatment.  Palliative care was consulted to help establish goals.  SOCIAL HISTORY:    Patient is divorced.  He lives at home alone.  Is a daughter who is involved in his care.  He also has a sister involved.  Patient worked as a Psychologist, sport and exercise.  ADVANCE DIRECTIVES:  On file  CODE STATUS: DNR  PAST MEDICAL HISTORY: Past Medical History:  Diagnosis Date  . A-fib ([place])   . Cancer [facility])    Colon  . CHF (congestive heart failure) ([place])   . Diabetes mellitus without complication ([place])   . Dyspnea   . Elevated lipids   . Hypertension   . PVD (peripheral vascular disease) ([place])     PAST SURGICAL HISTORY:  Past Surgical History:  Procedure Laterality Date  . COLON RESECTION     with colostomy  . COLONOSCOPY    . CYSTOSCOPY WITH STENT PLACEMENT Bilateral 01/18/2017   Procedure: CYSTOSCOPY WITH STENT PLACEMENT;  Surgeon: [name], MD;  Location: [facility] ORS;  Service: Urology;  Laterality: Bilateral;  . IR FLUORO GUIDE PORT INSERTION RIGHT  01/24/2017  . IR NEPHROSTOGRAM LEFT THRU EXISTING ACCESS  09/18/2017  . IR NEPHROSTOGRAM RIGHT THRU EXISTING ACCESS  09/18/2017  . IR NEPHROSTOMY EXCHANGE LEFT  03/14/2017  . IR NEPHROSTOMY EXCHANGE LEFT  05/03/2017  . IR NEPHROSTOMY EXCHANGE  LEFT  06/14/2017  . IR NEPHROSTOMY EXCHANGE LEFT  07/26/2017  . IR NEPHROSTOMY EXCHANGE LEFT  09/18/2017  . IR NEPHROSTOMY EXCHANGE LEFT  11/08/2017  . IR NEPHROSTOMY EXCHANGE LEFT  12/20/2017  . IR NEPHROSTOMY EXCHANGE LEFT  01/24/2018  . IR NEPHROSTOMY EXCHANGE RIGHT  03/14/2017  . IR NEPHROSTOMY EXCHANGE RIGHT  05/03/2017  . IR NEPHROSTOMY EXCHANGE RIGHT  06/14/2017  . IR NEPHROSTOMY EXCHANGE RIGHT  07/26/2017  . IR NEPHROSTOMY EXCHANGE RIGHT  10/04/2017  . IR NEPHROSTOMY EXCHANGE RIGHT  11/08/2017  . IR NEPHROSTOMY EXCHANGE RIGHT  12/20/2017  . IR NEPHROSTOMY EXCHANGE RIGHT  01/24/2018  . IR NEPHROSTOMY PLACEMENT LEFT  01/19/2017  . IR NEPHROSTOMY PLACEMENT RIGHT  01/19/2017  . IVC FILTER REMOVAL N/A 08/28/2016   Procedure: IVC Filter Removal;  Surgeon: Katha Cabal, MD;  Location: [place];  Service: Cardiovascular;  Laterality: N/A;  . LOWER EXTREMITY VENOGRAPHY Left 12/18/2016   Procedure: Lower Extremity Venography;  Surgeon: Katha Cabal, MD;  Location: [place] [facility];  Service: Cardiovascular;  Laterality: Left;  . PERIPHERAL VASCULAR CATHETERIZATION Left 05/08/2016   Procedure: Lower Extremity Venography;  Surgeon: Katha Cabal, MD;  Location: [place] [facility];  Service: Cardiovascular;  Laterality: Left;    HEMATOLOGY/ONCOLOGY HISTORY:  Oncology History   # 2012- RECTO-SIGMOID-poorly diff STAGE III CA [s/p neo-adj chemo-RT- poor response]; APR [residual ypT3; 5/8 LN positiveDr.Smith/Dr.Pandit]  S/p FOLFOX   # SEP 2019- Pre-sacral fluid/mass- increasing ~ 5-6 cm [increased  from dec 2017; also NEW retroperitoneal lymph nodes; inguinal adenopathy]; SEP 2018- left supraclavicular; mediastinal; right hilar-retroperitoneal; pelvic mass/node; bilateral inguinal adenopathy; s/p right Ingiunal LNBx - RECURRENT CA [necrotic;insuff tissue for F-One]  # OCT 8th 2018-FOLFIRI [first cycle 5FU alone]; OCT 22nd FOLFIRI; April 2019- significant PR  # MAY 2019- FOLFIRI q 3W  [pt pref]; AUG 2019- CT Progression/ ingiunal LN; Aug 26th Lonsurf; STOPPED sep 2019- sec to into;/OCT 17th CT-Progression  ------------------------------  ---  OCT-NOV 2018-Sepsis x2 sec to UTI [s/p ABx;]  # s/p RT rectal [11/28-last treatment]  # Acute renal insuff [s/p bil PCN]; hematuria- sec to bladder invol [? Contra-indication to avastin]  # LEFT LE DVT [? May [facility]- Dr.Schneir]; Eliquis.   # CHF/COPD/CKD/ PN sec to [place]  # [facility] One-PDL-1/ TPS- 0% [2012-rectal specimen];12/26-Omniseq- K-RAS MUTATED;MSS; no targets** ---------------------------------------------   DIAGNOSIS: rectal cancer  STAGE:   IV  ; GOALS: palliative  CURRENT/MOST RECENT THERAPY: LONSURF [AUG  26th]      Rectal cancer (HCC)    ALLERGIES:  has No Known Allergies.  MEDICATIONS:  Current Outpatient Medications  Medication Sig Dispense Refill  . B-D ULTRAFINE III SHORT PEN 31G X 8 MM MISC     . [START ON 03/13/2018] ciprofloxacin (CIPRO) 500 MG tablet Take 0.5 tablets (250 mg total) by mouth every 12 (twelve) hours. Start medication on 03/13/2018. 6 tablet 0  . Continuous Blood Gluc Receiver (FREESTYLE LIBRE READER) DEVI Use 1 Units as directed. Check CBG's four times daily. Dx: E11.9    . Continuous Blood Gluc Sensor (FREESTYLE LIBRE SENSOR SYSTEM) MISC Use 1 Units as directed. Check CBG's four times daily. Dx: E11.9    . DIGOX 250 MCG tablet Take 0.25 mg by mouth daily.     [name] ELIQUIS 5 MG TABS tablet Take 5 mg by mouth every 12 (twelve) hours.  0  . furosemide (LASIX) 40 MG tablet Take 40 mg by mouth daily.   0  . glimepiride (AMARYL) 4 MG tablet Take 1 tablet by mouth daily.    [name] HYDROcodone-acetaminophen (NORCO) 10-325 MG tablet Take 1 tablet by mouth every 6 (six) hours as needed for moderate pain or severe pain. 60 tablet 0  . LANTUS SOLOSTAR 100 UNIT/ML Solostar Pen Inject 20 Units into the skin at bedtime.   0  . losartan (COZAAR) 100 MG tablet Take 100 mg by mouth daily.    .  metoprolol tartrate (LOPRESSOR) 25 MG tablet Take 25 mg by mouth 2 (two) times daily.     . mirabegron ER (MYRBETRIQ) 50 MG TB24 tablet Take 1 tablet (50 mg total) by mouth daily. 30 tablet 3  . pravastatin (PRAVACHOL) 20 MG tablet take 97m by mouth daily at bedtime    . trifluridine-tipiracil (LONSURF) 20-8.19 MG tablet Take 4 tablets (80 mg of trifluridine total) by mouth 2 (two) times daily after a meal. Take on days 1-5, 8-12. Repeat every 28day 80 tablet 3   No current facility-administered medications for this visit.     VITAL SIGNS: BP 127/68 (BP Location: Left Arm, Patient Position: Sitting)   Pulse 86   Resp 16  There were no vitals filed for this visit.  Estimated body mass index is 33.86 kg/m as calculated from the following:   Height as of 01/20/18: _0  (1.778 m).   Weight as of 02/24/18: 236 lb (107 kg).  LABS: CBC:    Component Value Date/Time   WBC 10.2 02/24/2018 1407   HGB 10.3 (L) 02/24/2018  1407   HGB 13.9 12/17/2012 0608   HCT 31.3 (L) 02/24/2018 1407   HCT 38.8 (L) 12/17/2012 0608   PLT 338 02/24/2018 1407   PLT 147 (L) 12/17/2012 0608   MCV 84.1 02/24/2018 1407   MCV 87 12/17/2012 0608   NEUTROABS 7.1 02/24/2018 1407   NEUTROABS 4.7 12/17/2012 0608   LYMPHSABS 1.3 02/24/2018 1407   LYMPHSABS 0.8 (L) 12/17/2012 0608   MONOABS 0.7 02/24/2018 1407   MONOABS 0.5 12/17/2012 0608   EOSABS 1.0 (H) 02/24/2018 1407   EOSABS 0.1 12/17/2012 0608   BASOSABS 0.1 02/24/2018 1407   BASOSABS 0.0 12/17/2012 0608   Comprehensive Metabolic Panel:    Component Value Date/Time   NA 134 (L) 02/24/2018 1407   NA 139 12/17/2012 0608   K 4.4 02/24/2018 1407   K 3.8 12/17/2012 0608   CL 104 02/24/2018 1407   CL 108 (H) 12/17/2012 0608   CO2 20 (L) 02/24/2018 1407   CO2 26 12/17/2012 0608   BUN 24 (H) 02/24/2018 1407   BUN 21 01/01/2017 1621   BUN 15 12/17/2012 0608   CREATININE 1.92 (H) 02/24/2018 1407   CREATININE 0.84 12/17/2012 0608   GLUCOSE 191 (H)  02/24/2018 1407   GLUCOSE 217 (H) 12/17/2012 0608   CALCIUM 9.1 02/24/2018 1407   CALCIUM 8.2 (L) 12/17/2012 0608   AST 22 02/24/2018 1407   AST 16 12/17/2012 0608   ALT 20 02/24/2018 1407   ALT 23 12/17/2012 0608   ALKPHOS 110 02/24/2018 1407   ALKPHOS 78 12/17/2012 0608   BILITOT 0.4 02/24/2018 1407   BILITOT 0.7 12/17/2012 0608   PROT 7.7 02/24/2018 1407   PROT 5.8 (L) 12/17/2012 0608   ALBUMIN 3.0 (L) 02/24/2018 1407   ALBUMIN 2.9 (L) 12/17/2012 0608    RADIOGRAPHIC STUDIES: Ct Abdomen Pelvis W Contrast  Result Date: 02/13/2018 CLINICAL DATA:  Pelvic pain. Urethral discharge. Locally advanced metastatic rectal carcinoma. EXAM: CT ABDOMEN AND PELVIS WITH CONTRAST TECHNIQUE: Multidetector CT imaging of the abdomen and pelvis was performed using the standard protocol following bolus administration of intravenous contrast. CONTRAST:  167m ISOVUE-300 IOPAMIDOL (ISOVUE-300) INJECTION 61% COMPARISON:  11/25/2017 FINDINGS: Lower Chest: No acute findings. Hepatobiliary: No hepatic masses identified. Gallbladder is unremarkable. Pancreas:  No mass or inflammatory changes. Spleen: Within normal limits in size and appearance. Adrenals/Urinary Tract: Bilateral percutaneous nephrostomy tubes are seen in appropriate position and there is no evidence of hydronephrosis. No renal masses identified. Mild-to-moderate left renal atrophy is stable. Diffuse wall thickening and calcification of the urinary bladder is unchanged in appearance. Stomach/Bowel: Perirectal and presacral soft tissue density with central fluid component is unchanged, and likely due to post treatment changes. No new or increased soft tissue density seen within the pelvis. Vascular/Lymphatic: New mild retroperitoneal lymphadenopathy is seen in the left paraaortic and aortocaval spaces, with largest index lymph node in the left paraaortic region measuring 1.5 cm on image 37/2. Mild bilateral inguinal lymphadenopathy is mildly increased.  Index lymph node in the right inguinal region measures 16 mm on image 82/2 compared to 12 mm previously. Index lymph node in left inguinal region measures 14 mm on image 87/2 compared to 8 mm previously. Reproductive:  No mass or other significant abnormality. Other:  None. Musculoskeletal:  No suspicious bone lesions identified. IMPRESSION: Bilateral percutaneous nephrostomy tubes in appropriate position. No evidence of hydronephrosis. Increased mild abdominal retroperitoneal and bilateral inguinal lymphadenopathy, suspicious for metastatic disease. Stable perirectal and presacral soft tissue density and diffuse bladder wall thickening.  Electronically Signed   By: Earle Gell M.D.   On: 02/13/2018 18:32    PERFORMANCE STATUS (ECOG) : 1 - Symptomatic but completely ambulatory  Review of Systems As noted above. Otherwise, a complete review of systems is negative.  Physical Exam General: NAD, frail appearing Cardiovascular: regular rate and rhythm Pulmonary: clear ant fields Abdomen: soft, nontender, + bowel sounds GU: Large amount of scrotal swelling Extremities: Bilateral lower extremity edema but significantly increase in left side Skin: some excoriation to suprapubic and left inguinal area Neurological: Weakness but otherwise nonfocal  IMPRESSION: Met with patient and his sister to discuss goals.  Patient confirms that he has decided to forego future treatment.  He recognizes his decision might result in reduced life expectancy.  However, he says his primary goal is to focus on comfort and quality of life over quantity of life.  He describes having experienced a significant symptom burden with treatment, which led to his decision.  [place] completely [place].  He enjoys riding his tractor.  It would be reasonable to consider hospice in the future when he experiences functional decline.  Symptomatically, patient had some pain but this is well  controlled on PRN Norco.  Most distressing to the patient is the left lower extremity edema and large amount of scrotal swelling.  This is not a new symptom but has been present over the past few months.  I note that patient has a history of DVT in the left lower extremity and is anticoagulated on Eliquis.  Patient was curious if anything could be done to help manage the swelling.  He specifically asked about referral to the vein and vascular specialist, which she is seen before.  I offered to coordinate a referral the patient then asked me for time to think about it.  His most recent CT scan showed a large amount of bulky retroperitoneal and left inguinal lymphadenopathy.  I suspect this is the cause of his lower extremity edema.    We did discuss advance care planning.  Patient would want his daughter to be his decision maker.  He thinks he has healthcare power of attorney and living will documents at home.  He says he would want to be a DNR and has a form specifying that in the home.  Case discussed with Dr. Rogue Bussing.  PLAN: Continue supportive care Recommend hospice if patient experiences future decline RTC in 2-4 weeks   Patient expressed understanding and was in agreement with this plan. He also understands that He can call clinic at any time with any questions, concerns, or complaints.    Time Total: 45 minutes  Visit consisted of counseling and education dealing with the complex and emotionally intense issues of symptom management and palliative care in the setting of serious and potentially life-threatening illness.Greater than 50%  of this time was spent counseling and coordinating care related to the above assessment and plan.  Signed by: Altha Harm, PhD, DNP, NP-C, [facility] [phone] (Work Cell)

## 2018-03-07 ENCOUNTER — Telehealth: Payer: Self-pay | Admitting: Hospice and Palliative Medicine

## 2018-03-07 NOTE — Telephone Encounter (Signed)
I received a message to call patient's daughter, Nira Conn.  At [phone].  I tried calling her but did not reach her.

## 2018-03-10 ENCOUNTER — Other Ambulatory Visit: Payer: Self-pay | Admitting: Hospice and Palliative Medicine

## 2018-03-10 ENCOUNTER — Telehealth: Payer: Self-pay | Admitting: *Deleted

## 2018-03-10 MED ORDER — HYDROCODONE-ACETAMINOPHEN 10-325 MG PO TABS: 1.0000 | ORAL_TABLET | ORAL | 0 refills | Status: DC | PRN

## 2018-03-10 NOTE — Progress Notes (Signed)
I spoke with patient's daughter by phone. Patient has had increased LE edema and pain over the weekend. He is requiring more of the Norco, which he reportedly is taking 1 tablet every 4 hours. She requests a refill of this. Will send electronically to pharmacy.   Patient reportedly has an appointment this week (tomorrow?) with vascular to see if anything can be done to help the edema. Patient also has follow up later this week with urology for urostomy tube exchange.   Patient has a f/u appointment with me next week. We can see him sooner if needed. Daughter will let us know.   Plan: Refill Norco 10-325mg  Q4H prn for pain, #60, no refills Appointments with vascular and urology as outlined above RTC at time of next scheduled appointment or sooner if needed

## 2018-03-10 NOTE — Telephone Encounter (Signed)
I spoke with daughter by phone. Order note placed. Norco refilled. Patient has pending appointments with vascular and urology.

## 2018-03-10 NOTE — Telephone Encounter (Signed)
Daughter Nira Conn called asking to speak with Billey Chang, NP [phone], or [phone]

## 2018-03-11 ENCOUNTER — Ambulatory Visit (INDEPENDENT_AMBULATORY_CARE_PROVIDER_SITE_OTHER): Payer: Medicare Other | Admitting: Nurse Practitioner

## 2018-03-11 ENCOUNTER — Encounter (INDEPENDENT_AMBULATORY_CARE_PROVIDER_SITE_OTHER): Payer: Self-pay | Admitting: Nurse Practitioner

## 2018-03-11 VITALS — BP 108/55 | HR 73 | Resp 18 | Ht 70.5 in | Wt 238.0 lb

## 2018-03-11 DIAGNOSIS — E119 Type 2 diabetes mellitus without complications: Secondary | ICD-10-CM

## 2018-03-11 DIAGNOSIS — I871 Compression of vein: Secondary | ICD-10-CM | POA: Diagnosis not present

## 2018-03-11 DIAGNOSIS — M79605 Pain in left leg: Secondary | ICD-10-CM | POA: Diagnosis not present

## 2018-03-13 ENCOUNTER — Ambulatory Visit (INDEPENDENT_AMBULATORY_CARE_PROVIDER_SITE_OTHER): Payer: Medicare Other | Admitting: Vascular Surgery

## 2018-03-14 ENCOUNTER — Other Ambulatory Visit: Payer: Self-pay

## 2018-03-14 ENCOUNTER — Encounter: Payer: Self-pay | Admitting: Interventional Radiology

## 2018-03-14 ENCOUNTER — Ambulatory Visit: Admission: RE | Admit: 2018-03-14 | Payer: Medicare Other | Source: Ambulatory Visit

## 2018-03-14 ENCOUNTER — Ambulatory Visit
Admission: RE | Admit: 2018-03-14 | Discharge: 2018-03-14 | Disposition: A | Discharge status: Home / Self Care | Payer: Medicare Other | Source: Ambulatory Visit | Attending: Urology | Admitting: Urology

## 2018-03-14 DIAGNOSIS — Z85048 Personal history of other malignant neoplasm of rectum, rectosigmoid junction, and anus: Secondary | ICD-10-CM | POA: Insufficient documentation

## 2018-03-14 DIAGNOSIS — N1330 Unspecified hydronephrosis: Secondary | ICD-10-CM | POA: Diagnosis not present

## 2018-03-14 DIAGNOSIS — N135 Crossing vessel and stricture of ureter without hydronephrosis: Secondary | ICD-10-CM

## 2018-03-14 DIAGNOSIS — Z436 Encounter for attention to other artificial openings of urinary tract: Secondary | ICD-10-CM | POA: Diagnosis not present

## 2018-03-14 DIAGNOSIS — C20 Malignant neoplasm of rectum: Secondary | ICD-10-CM | POA: Diagnosis not present

## 2018-03-14 HISTORY — PX: IR NEPHROSTOMY EXCHANGE LEFT: IMG6069

## 2018-03-14 HISTORY — PX: IR NEPHROSTOMY EXCHANGE RIGHT: IMG6070

## 2018-03-14 LAB — GLUCOSE, CAPILLARY: GLUCOSE-CAPILLARY: 315 mg/dL — AB (ref 70–99)

## 2018-03-14 MED ORDER — LIDOCAINE-EPINEPHRINE (PF) 1 %-1:200000 IJ SOLN
INTRAMUSCULAR | Status: AC
  Filled 2018-03-14: qty 30

## 2018-03-14 MED ORDER — IOPAMIDOL (ISOVUE-300) INJECTION 61%
50.0000 mL | Freq: Once | INTRAVENOUS | Status: AC | PRN
  Administered 2018-03-14: 10 mL

## 2018-03-14 NOTE — Progress Notes (Signed)
Patient here for scheduled app with complaints of severe left leg pain 8/10 after having taken am home pain medications. Dr Pascal Lux notified.

## 2018-03-14 NOTE — Procedures (Signed)
Pre Procedure Dx: Hydronephrosis Post Procedure Dx: Same  Successful bilateral PCN exchange.    EBL: None   No immediate complications.   Ronny Bacon, MD Pager #: [phone]

## 2018-03-17 ENCOUNTER — Encounter (INDEPENDENT_AMBULATORY_CARE_PROVIDER_SITE_OTHER): Payer: Self-pay | Admitting: Nurse Practitioner

## 2018-03-17 ENCOUNTER — Encounter (INDEPENDENT_AMBULATORY_CARE_PROVIDER_SITE_OTHER): Payer: Self-pay

## 2018-03-17 NOTE — Progress Notes (Signed)
Subjective:    Patient ID: Donald Townsend, male    DOB: 03-08-1951, 67 y.o.   MRN: [id number] Chief Complaint  Patient presents with  . Follow-up    Possible stent has moved    HPI  Donald Townsend is a 67 y.o. 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  The pain occurs mainly when he is sitting upright.  When he is laying down flat the pain is relatively nonexistent.  This is been ongoing for the last few months.  He also notes that when the swelling is severe his leg turns a purplish mottled color.  He states that the pain is 10 out of 10.  He denies any fever, chills, nausea, vomiting.  He denies any chest pain or shortness of breath. Past Medical History:  Diagnosis Date  . A-fib ([place])   . Cancer [facility])    Colon  . CHF (congestive heart failure) ([place])   . Diabetes mellitus without complication ([place])   . Dyspnea   . Elevated lipids   . Hypertension   . PVD (peripheral vascular disease) ([place])     Past Surgical History:  Procedure Laterality Date  . COLON RESECTION     with colostomy  . COLONOSCOPY    . CYSTOSCOPY WITH STENT PLACEMENT Bilateral 01/18/2017   Procedure: CYSTOSCOPY WITH STENT PLACEMENT;  Surgeon: [name], MD;  Location: [facility] ORS;  Service: Urology;  Laterality: Bilateral;  . IR FLUORO GUIDE PORT INSERTION RIGHT  01/24/2017  . IR NEPHROSTOGRAM LEFT THRU EXISTING ACCESS  09/18/2017  . IR NEPHROSTOGRAM RIGHT THRU EXISTING ACCESS  09/18/2017  . IR NEPHROSTOMY EXCHANGE LEFT  03/14/2017  . IR NEPHROSTOMY EXCHANGE LEFT  05/03/2017  . IR NEPHROSTOMY EXCHANGE LEFT  06/14/2017  . IR NEPHROSTOMY EXCHANGE LEFT  07/26/2017  . IR NEPHROSTOMY EXCHANGE LEFT  09/18/2017  . IR NEPHROSTOMY EXCHANGE LEFT  11/08/2017  . IR NEPHROSTOMY EXCHANGE LEFT  12/20/2017  . IR NEPHROSTOMY EXCHANGE LEFT  01/24/2018  . IR NEPHROSTOMY EXCHANGE LEFT  03/14/2018  . IR NEPHROSTOMY EXCHANGE RIGHT   03/14/2017  . IR NEPHROSTOMY EXCHANGE RIGHT  05/03/2017  . IR NEPHROSTOMY EXCHANGE RIGHT  06/14/2017  . IR NEPHROSTOMY EXCHANGE RIGHT  07/26/2017  . IR NEPHROSTOMY EXCHANGE RIGHT  10/04/2017  . IR NEPHROSTOMY EXCHANGE RIGHT  11/08/2017  . IR NEPHROSTOMY EXCHANGE RIGHT  12/20/2017  . IR NEPHROSTOMY EXCHANGE RIGHT  01/24/2018  . IR NEPHROSTOMY EXCHANGE RIGHT  03/14/2018  . IR NEPHROSTOMY PLACEMENT LEFT  01/19/2017  . IR NEPHROSTOMY PLACEMENT RIGHT  01/19/2017  . IVC FILTER REMOVAL N/A 08/28/2016   Procedure: IVC Filter Removal;  Surgeon: Katha Cabal, MD;  Location: [place] [facility];  Service: Cardiovascular;  Laterality: N/A;  . LOWER EXTREMITY VENOGRAPHY Left 12/18/2016   Procedure: Lower Extremity Venography;  Surgeon: Katha Cabal, MD;  Location: [place] [facility];  Service: Cardiovascular;  Laterality: Left;  . PERIPHERAL VASCULAR CATHETERIZATION Left 05/08/2016   Procedure: Lower Extremity Venography;  Surgeon: Katha Cabal, MD;  Location: [place] [facility];  Service: Cardiovascular;  Laterality: Left;    Social History   Socioeconomic History  . Marital status: Divorced    Spouse name: Not on file  . Number of children: Not on file  . Years of education: Not on file  . Highest education level: Not on file  Occupational History  . Not on file  Social Needs  . Financial resource strain: Not on file  . Food insecurity:    Worry: Not on file    Inability: Not on file  . Transportation needs:    Medical: Not on file    Non-medical: Not on file  Tobacco Use  . Smoking status: Never Smoker  . Smokeless tobacco: Never Used  Substance and Sexual Activity  . Alcohol use: No  . Drug use: No  . Sexual activity: Not on file  Lifestyle  . Physical activity:    Days per week: Not on file    Minutes per session: Not on file  . Stress: Not on file  Relationships  . Social connections:    Talks on phone: Not on file    Gets together: Not on file    Attends religious  service: Not on file    Active member of club or organization: Not on file    Attends meetings of clubs or organizations: Not on file    Relationship status: Not on file  . Intimate partner violence:    Fear of current or ex partner: Not on file    Emotionally abused: Not on file    Physically abused: Not on file    Forced sexual activity: Not on file  Other Topics Concern  . Not on file  Social History Narrative  . Not on file    Family History  Problem Relation Age of Onset  . Diabetes Mother   . Stroke Mother   . Prostate cancer Brother   . Cancer Sister        Breast cancer  . Kidney cancer Neg Hx   . Bladder Cancer Neg Hx     No Known Allergies   Review of Systems   Review of Systems: Negative Unless Checked Constitutional: [x] Weight loss  [] Fever  [] Chills Cardiac: [] Chest pain   []  Atrial Fibrillation  [] Palpitations   [] Shortness of breath when laying flat   [] Shortness of breath with exertion. Vascular:  [] Pain in legs with walking   [] Pain in legs with standing  [] History of DVT   [] Phlebitis   [] Swelling in legs   [] Varicose veins   [] Non-healing ulcers Pulmonary:   [] Uses home oxygen   [] Productive cough   [] Hemoptysis   [] Wheeze  [] COPD   [] Asthma Neurologic:  [] Dizziness   [] Seizures   [] History of stroke   [] History of TIA  [] Aphasia   [] Vissual changes   [] Weakness or numbness in arm   [x] Weakness or numbness in leg Musculoskeletal:   [] Joint swelling   [] Joint pain   [] Low back pain  []  History of Knee Replacement Hematologic:  [] Easy bruising  [] Easy bleeding   [x] Hypercoagulable state   [] Anemic Gastrointestinal:  [] Diarrhea   [] Vomiting  [] Gastroesophageal reflux/heartburn   [] Difficulty swallowing. Genitourinary:  [] Chronic kidney disease   [x] Difficult urination  [] Anuric   [] Blood in urine Skin:  [] Rashes   [] Ulcers  Psychological:  [] History of anxiety   []  History of major depression  []  Memory Difficulties     Objective:   Physical Exam  BP  (!) 108/55 (BP Location: Right Arm, Patient Position: Sitting)   Pulse 73   Resp 18   Ht 5' 10.5" (1.791 m)   Wt 238 lb (108 kg)   BMI 33.67 kg/m   Gen: WD/WN, NAD Head: [place]/AT, No temporalis wasting.  Ear/Nose/Throat: Hearing grossly intact, nares w/o erythema or drainage Eyes: PER, EOMI, sclera nonicteric.  Neck: Supple, no masses.  No  JVD.  Pulmonary:  Good air movement, no use of accessory muscles.  Cardiac: RRR Vascular:  Left lower extremity 3+ edema Vessel Right Left  Radial Palpable Palpable  Gastrointestinal: soft, non-distended. No guarding/no peritoneal signs.  Musculoskeletal: M/S 5/5 throughout.  No deformity or atrophy.  Neurologic: Pain and light touch intact in extremities.  Symmetrical.  Speech is fluent. Motor exam as listed above. Psychiatric: Judgment intact, Mood & affect appropriate for pt's clinical situation. Dermatologic: No Venous rashes. No Ulcers Noted.  No changes consistent with cellulitis. Lymph : No Cervical lymphadenopathy, no lichenification or skin changes of chronic lymphedema.      Assessment & Plan:   1. May-Thurner syndrome Left leg is markedly more swollen than his right.  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  He also has a history of rectal cancer which he has elected to stop treatment for this time.  Based on his symptoms, it is likely that the lesions may be occluding his left iliac stent.  We will plan for a venogram.  The risks and benefits as well as the alternative therapies was discussed in detail with the patient.  All questions were answered.  Patient agrees to proceed with venogram  The patient will follow up with me in the office after the procedure.     2. Type 2 diabetes mellitus without complication, without long-term current use of insulin (HCC) Continue hypoglycemic medications as already ordered, these medications have been reviewed and there are no changes at  this time.  Hgb A1C to be monitored as already arranged by primary service   3. Left leg pain See above   Current Outpatient Medications on File Prior to Visit  Medication Sig Dispense Refill  . DIGOX 250 MCG tablet Take 0.25 mg by mouth daily.     [name] ELIQUIS 5 MG TABS tablet Take 5 mg by mouth every 12 (twelve) hours.  0  . furosemide (LASIX) 40 MG tablet Take 40 mg by mouth daily.   0  . glimepiride (AMARYL) 4 MG tablet Take 1 tablet by mouth daily.    [name] HYDROcodone-acetaminophen (NORCO) 10-325 MG tablet Take 1 tablet by mouth every 4 (four) hours as needed for moderate pain or severe pain. 60 tablet 0  . LANTUS SOLOSTAR 100 UNIT/ML Solostar Pen Inject 20 Units into the skin at bedtime.   0  . losartan (COZAAR) 100 MG tablet Take 100 mg by mouth daily.    . metoprolol tartrate (LOPRESSOR) 25 MG tablet Take 25 mg by mouth 2 (two) times daily.     . mirabegron ER (MYRBETRIQ) 50 MG TB24 tablet Take 1 tablet (50 mg total) by mouth daily. 30 tablet 3  . pravastatin (PRAVACHOL) 20 MG tablet take 20mg  by mouth daily at bedtime    . sulfamethoxazole-trimethoprim (BACTRIM DS,SEPTRA DS) 800-160 MG tablet     . B-D ULTRAFINE III SHORT PEN 31G X 8 MM MISC     . ciprofloxacin (CIPRO) 500 MG tablet Take 0.5 tablets (250 mg total) by mouth every 12 (twelve) hours. Start medication on 03/13/2018. (Patient not taking: Reported on 03/11/2018) 6 tablet 0  . Continuous Blood Gluc Receiver (FREESTYLE LIBRE READER) DEVI Use 1 Units as directed. Check CBG's four times daily. Dx: E11.9    . Continuous Blood Gluc Sensor (FREESTYLE LIBRE SENSOR SYSTEM) MISC Use 1 Units as directed. Check CBG's four times daily. Dx: E11.9     No current  facility-administered medications on file prior to visit.     There are no Patient Instructions on file for this visit. No follow-ups on file.   Kris Hartmann, NP  This note was completed with Sales executive.  Any errors are purely unintentional.

## 2018-03-19 ENCOUNTER — Inpatient Hospital Stay (HOSPITAL_BASED_OUTPATIENT_CLINIC_OR_DEPARTMENT_OTHER): Payer: Medicare Other | Admitting: Hospice and Palliative Medicine

## 2018-03-19 ENCOUNTER — Inpatient Hospital Stay: Payer: Medicare Other

## 2018-03-19 ENCOUNTER — Other Ambulatory Visit (INDEPENDENT_AMBULATORY_CARE_PROVIDER_SITE_OTHER): Payer: Self-pay | Admitting: Nurse Practitioner

## 2018-03-19 ENCOUNTER — Inpatient Hospital Stay (HOSPITAL_BASED_OUTPATIENT_CLINIC_OR_DEPARTMENT_OTHER): Payer: Medicare Other | Admitting: Internal Medicine

## 2018-03-19 VITALS — BP 112/70 | HR 112 | Temp 98.3°F | Resp 16 | Wt 236.0 lb

## 2018-03-19 DIAGNOSIS — C20 Malignant neoplasm of rectum: Secondary | ICD-10-CM

## 2018-03-19 DIAGNOSIS — G893 Neoplasm related pain (acute) (chronic): Secondary | ICD-10-CM | POA: Diagnosis not present

## 2018-03-19 DIAGNOSIS — Z7901 Long term (current) use of anticoagulants: Secondary | ICD-10-CM

## 2018-03-19 DIAGNOSIS — R630 Anorexia: Secondary | ICD-10-CM

## 2018-03-19 DIAGNOSIS — Z66 Do not resuscitate: Secondary | ICD-10-CM

## 2018-03-19 DIAGNOSIS — R600 Localized edema: Secondary | ICD-10-CM

## 2018-03-19 DIAGNOSIS — Z515 Encounter for palliative care: Secondary | ICD-10-CM | POA: Diagnosis not present

## 2018-03-19 DIAGNOSIS — I509 Heart failure, unspecified: Secondary | ICD-10-CM | POA: Diagnosis not present

## 2018-03-19 DIAGNOSIS — I4891 Unspecified atrial fibrillation: Secondary | ICD-10-CM

## 2018-03-19 DIAGNOSIS — R590 Localized enlarged lymph nodes: Secondary | ICD-10-CM

## 2018-03-19 DIAGNOSIS — N184 Chronic kidney disease, stage 4 (severe): Secondary | ICD-10-CM | POA: Diagnosis not present

## 2018-03-19 DIAGNOSIS — Z86718 Personal history of other venous thrombosis and embolism: Secondary | ICD-10-CM

## 2018-03-19 DIAGNOSIS — E119 Type 2 diabetes mellitus without complications: Secondary | ICD-10-CM

## 2018-03-19 DIAGNOSIS — N1330 Unspecified hydronephrosis: Secondary | ICD-10-CM | POA: Diagnosis not present

## 2018-03-19 DIAGNOSIS — Z9221 Personal history of antineoplastic chemotherapy: Secondary | ICD-10-CM | POA: Diagnosis not present

## 2018-03-19 DIAGNOSIS — R369 Urethral discharge, unspecified: Secondary | ICD-10-CM | POA: Diagnosis not present

## 2018-03-19 LAB — CBC WITH DIFFERENTIAL/PLATELET
ABS IMMATURE GRANULOCYTES: 0.09 10*3/uL — AB (ref 0.00–0.07)
BASOS ABS: 0.1 10*3/uL (ref 0.0–0.1)
Basophils Relative: 1 %
EOS PCT: 9 %
Eosinophils Absolute: 1.3 10*3/uL — ABNORMAL HIGH (ref 0.0–0.5)
HEMATOCRIT: 31.6 % — AB (ref 39.0–52.0)
HEMOGLOBIN: 9.8 g/dL — AB (ref 13.0–17.0)
Immature Granulocytes: 1 %
LYMPHS ABS: 1 10*3/uL (ref 0.7–4.0)
Lymphocytes Relative: 7 %
MCH: 26.6 pg (ref 26.0–34.0)
MCHC: 31 g/dL (ref 30.0–36.0)
MCV: 85.6 fL (ref 80.0–100.0)
MONO ABS: 0.7 10*3/uL (ref 0.1–1.0)
Monocytes Relative: 5 %
NEUTROS ABS: 10.6 10*3/uL — AB (ref 1.7–7.7)
NRBC: 0 % (ref 0.0–0.2)
Neutrophils Relative %: 77 %
Platelets: 358 10*3/uL (ref 150–400)
RBC: 3.69 MIL/uL — ABNORMAL LOW (ref 4.22–5.81)
RDW: 13.9 % (ref 11.5–15.5)
WBC: 13.7 10*3/uL — ABNORMAL HIGH (ref 4.0–10.5)

## 2018-03-19 LAB — COMPREHENSIVE METABOLIC PANEL
ALBUMIN: 2.7 g/dL — AB (ref 3.5–5.0)
ALT: 15 U/L (ref 0–44)
AST: 23 U/L (ref 15–41)
Alkaline Phosphatase: 108 U/L (ref 38–126)
Anion gap: 13 (ref 5–15)
BUN: 25 mg/dL — AB (ref 8–23)
CO2: 19 mmol/L — AB (ref 22–32)
CREATININE: 1.78 mg/dL — AB (ref 0.61–1.24)
Calcium: 8.6 mg/dL — ABNORMAL LOW (ref 8.9–10.3)
Chloride: 99 mmol/L (ref 98–111)
GFR calc Af Amer: 44 mL/min — ABNORMAL LOW (ref >60)
GFR calc non Af Amer: 38 mL/min — ABNORMAL LOW (ref >60)
Glucose, Bld: 237 mg/dL — ABNORMAL HIGH (ref 70–99)
POTASSIUM: 4.5 mmol/L (ref 3.5–5.1)
SODIUM: 131 mmol/L — AB (ref 135–145)
Total Bilirubin: 0.5 mg/dL (ref 0.3–1.2)
Total Protein: 7 g/dL (ref 6.5–8.1)

## 2018-03-19 MED ORDER — FENTANYL 25 MCG/HR TD PT72: 25.0000 ug | MEDICATED_PATCH | TRANSDERMAL | 0 refills | Status: DC

## 2018-03-19 NOTE — Progress Notes (Signed)
[place] OFFICE PROGRESS NOTE  Patient Care Team: Dion Body, MD as PCP - General (Family Medicine) Clent Jacks, RN as Registered Nurse Cammie Sickle, MD as Medical Oncologist (Medical Oncology)  Cancer Staging No matching staging information was found for the patient.   Oncology History   # 2012- RECTO-SIGMOID-poorly diff STAGE III CA [s/p neo-adj chemo-RT- poor response]; APR [residual ypT3; 5/8 LN positiveDr.Smith/Dr.Pandit]  S/p FOLFOX   # SEP 2019- Pre-sacral fluid/mass- increasing ~ 5-6 cm [increased from dec 2017; also NEW retroperitoneal lymph nodes; inguinal adenopathy]; SEP 2018- left supraclavicular; mediastinal; right hilar-retroperitoneal; pelvic mass/node; bilateral inguinal adenopathy; s/p right Ingiunal LNBx - RECURRENT CA [necrotic;insuff tissue for F-One]  # OCT 8th 2018-FOLFIRI [first cycle 5FU alone]; OCT 22nd FOLFIRI; April 2019- significant PR  # MAY 2019- FOLFIRI q 3W [pt pref]; AUG 2019- CT Progression/ ingiunal LN; Aug 26th Lonsurf; STOPPED sep 2019- sec to into;/OCT 17th CT-Progression  ------------------------------  ---  OCT-NOV 2018-Sepsis x2 sec to UTI [s/p ABx;]  # s/p RT rectal [11/28-last treatment]  # Acute renal insuff [s/p bil PCN]; hematuria- sec to bladder invol [? Contra-indication to avastin]  # LEFT LE DVT [? May Thurner's- Dr.Schneir]; Eliquis.   # CHF/COPD/CKD/ PN sec to [place]  # [facility] One-PDL-1/ TPS- 0% [2012-rectal specimen];12/26-Omniseq- K-RAS MUTATED;MSS; no targets** ---------------------------------------------   DIAGNOSIS: rectal cancer  STAGE:   IV  ; GOALS: palliative  CURRENT/MOST RECENT THERAPY: Surveillance/palliative care     Rectal cancer ([place])      INTERVAL HISTORY:  Donald Townsend 67 y.o.  male pleasant patient above history of metastatic rectal cancer most recently on Lonsurf is here for follow-up.  Patient continues to feel poorly.  Continues to complain of  swelling of his left lower extremitiy/pain.  Awaiting vascular evaluation and 3.  Continues to have penile discharge.  Chronic fatigue.  Poor appetite. Review of Systems  Constitutional: Positive for fever, malaise/fatigue and weight loss. Negative for chills and diaphoresis.  HENT: Negative for nosebleeds and sore throat.   Eyes: Negative for double vision.  Respiratory: Negative for cough, hemoptysis, sputum production, shortness of breath and wheezing.   Cardiovascular: Negative for chest pain, palpitations, orthopnea and leg swelling.  Gastrointestinal: Negative for abdominal pain, blood in stool, constipation, diarrhea, heartburn, melena, nausea and vomiting.  Genitourinary: Positive for hematuria. Negative for dysuria, frequency and urgency.  Musculoskeletal: Positive for back pain and joint pain.  Skin: Negative.  Negative for itching and rash.  Neurological: Negative for dizziness, tingling, focal weakness, weakness and headaches.  Endo/Heme/Allergies: Does not bruise/bleed easily.  Psychiatric/Behavioral: Negative for depression. The patient is not nervous/anxious and does not have insomnia.       PAST MEDICAL HISTORY :  Past Medical History:  Diagnosis Date  . A-fib ([place])   . Cancer [facility])    Colon  . CHF (congestive heart failure) ([place])   . Diabetes mellitus without complication ([place])   . Dyspnea   . Elevated lipids   . Hypertension   . PVD (peripheral vascular disease) ([place])     PAST SURGICAL HISTORY :   Past Surgical History:  Procedure Laterality Date  . COLON RESECTION     with colostomy  . COLONOSCOPY    . CYSTOSCOPY WITH STENT PLACEMENT Bilateral 01/18/2017   Procedure: CYSTOSCOPY WITH STENT PLACEMENT;  Surgeon: [name], MD;  Location: [facility] ORS;  Service: Urology;  Laterality: Bilateral;  . IR FLUORO GUIDE PORT INSERTION RIGHT  01/24/2017  . IR  NEPHROSTOGRAM LEFT THRU EXISTING ACCESS  09/18/2017  . IR NEPHROSTOGRAM RIGHT THRU EXISTING ACCESS   09/18/2017  . IR NEPHROSTOMY EXCHANGE LEFT  03/14/2017  . IR NEPHROSTOMY EXCHANGE LEFT  05/03/2017  . IR NEPHROSTOMY EXCHANGE LEFT  06/14/2017  . IR NEPHROSTOMY EXCHANGE LEFT  07/26/2017  . IR NEPHROSTOMY EXCHANGE LEFT  09/18/2017  . IR NEPHROSTOMY EXCHANGE LEFT  11/08/2017  . IR NEPHROSTOMY EXCHANGE LEFT  12/20/2017  . IR NEPHROSTOMY EXCHANGE LEFT  01/24/2018  . IR NEPHROSTOMY EXCHANGE LEFT  03/14/2018  . IR NEPHROSTOMY EXCHANGE RIGHT  03/14/2017  . IR NEPHROSTOMY EXCHANGE RIGHT  05/03/2017  . IR NEPHROSTOMY EXCHANGE RIGHT  06/14/2017  . IR NEPHROSTOMY EXCHANGE RIGHT  07/26/2017  . IR NEPHROSTOMY EXCHANGE RIGHT  10/04/2017  . IR NEPHROSTOMY EXCHANGE RIGHT  11/08/2017  . IR NEPHROSTOMY EXCHANGE RIGHT  12/20/2017  . IR NEPHROSTOMY EXCHANGE RIGHT  01/24/2018  . IR NEPHROSTOMY EXCHANGE RIGHT  03/14/2018  . IR NEPHROSTOMY PLACEMENT LEFT  01/19/2017  . IR NEPHROSTOMY PLACEMENT RIGHT  01/19/2017  . IVC FILTER REMOVAL N/A 08/28/2016   Procedure: IVC Filter Removal;  Surgeon: Katha Cabal, MD;  Location: [place] [facility];  Service: Cardiovascular;  Laterality: N/A;  . LOWER EXTREMITY VENOGRAPHY Left 12/18/2016   Procedure: Lower Extremity Venography;  Surgeon: Katha Cabal, MD;  Location: [place] [facility];  Service: Cardiovascular;  Laterality: Left;  . LOWER EXTREMITY VENOGRAPHY Left 03/21/2018   Procedure: LOWER EXTREMITY VENOGRAPHY;  Surgeon: Katha Cabal, MD;  Location: [place] [facility];  Service: Cardiovascular;  Laterality: Left;  . PERIPHERAL VASCULAR CATHETERIZATION Left 05/08/2016   Procedure: Lower Extremity Venography;  Surgeon: Katha Cabal, MD;  Location: [place] [facility];  Service: Cardiovascular;  Laterality: Left;    FAMILY HISTORY :   Family History  Problem Relation Age of Onset  . Diabetes Mother   . Stroke Mother   . Prostate cancer Brother   . Cancer Sister        Breast cancer  . Kidney cancer Neg Hx   . Bladder Cancer Neg Hx     SOCIAL HISTORY:    Social History   Tobacco Use  . Smoking status: Never Smoker  . Smokeless tobacco: Never Used  Substance Use Topics  . Alcohol use: No  . Drug use: No    ALLERGIES:  has No Known Allergies.  MEDICATIONS:  Current Outpatient Medications  Medication Sig Dispense Refill  . B-D ULTRAFINE III SHORT PEN 31G X 8 MM MISC     . Continuous Blood Gluc Receiver (FREESTYLE LIBRE READER) DEVI Use 1 Units as directed. Check CBG's four times daily. Dx: E11.9    . Continuous Blood Gluc Sensor (FREESTYLE LIBRE SENSOR SYSTEM) MISC Use 1 Units as directed. Check CBG's four times daily. Dx: E11.9    . DIGOX 250 MCG tablet Take 0.25 mg by mouth daily.     [name] ELIQUIS 5 MG TABS tablet Take 5 mg by mouth every 12 (twelve) hours.  0  . furosemide (LASIX) 40 MG tablet Take 40 mg by mouth daily.   0  . glimepiride (AMARYL) 4 MG tablet Take 1 tablet by mouth 2 (two) times daily.     [name] HYDROcodone-acetaminophen (NORCO) 10-325 MG tablet Take 1 tablet by mouth every 4 (four) hours as needed for moderate pain or severe pain. 60 tablet 0  . insulin aspart (NOVOLOG FLEXPEN) 100 UNIT/ML FlexPen Inject 3-15 Units into the skin 3 (three) times daily with  meals. Sliding scale    . LANTUS SOLOSTAR 100 UNIT/ML Solostar Pen Inject 20 Units into the skin at bedtime.   0  . losartan (COZAAR) 100 MG tablet Take 100 mg by mouth daily.    . metoprolol tartrate (LOPRESSOR) 25 MG tablet Take 25 mg by mouth 2 (two) times daily.     . pravastatin (PRAVACHOL) 20 MG tablet Take 20 mg by mouth daily.     [name] sulfamethoxazole-trimethoprim (BACTRIM DS,SEPTRA DS) 800-160 MG tablet Take 1 tablet by mouth daily.     . fentaNYL (DURAGESIC - DOSED MCG/HR) 25 MCG/HR patch Place 1 patch (25 mcg total) onto the skin every 3 (three) days. 10 patch 0  . HYDROcodone-acetaminophen (NORCO) 10-325 MG tablet TAKE 1 TABLET BY MOUTH EVERY 6 HOURS AS NEEDED FOR MODERATE TO SEVEREPAIN 90 tablet 0  . magnesium hydroxide (MILK OF MAGNESIA) 400 MG/5ML suspension  Take 30 mLs by mouth daily as needed for mild constipation. 360 mL 0  . ondansetron (ZOFRAN) 4 MG tablet Take 1 tablet (4 mg total) by mouth every 6 (six) hours as needed for nausea. 20 tablet 0   No current facility-administered medications for this visit.     PHYSICAL EXAMINATION: ECOG PERFORMANCE STATUS: 1 - Symptomatic but completely ambulatory  BP 112/70 (BP Location: Left Arm, Patient Position: Sitting)   Pulse (!) 112   Temp 98.3 F (36.8 C) (Oral)   Resp 16   Wt 236 lb (107 kg)   BMI 33.38 kg/m   Filed Weights   03/19/18 1432  Weight: 236 lb (107 kg)    Physical Exam  Constitutional: He is oriented to person, place, and time and well-developed, well-nourished, and in no distress.  Is alone.  Is walking himself.  HENT:  Head: Normocephalic and atraumatic.  Mouth/Throat: Oropharynx is clear and moist. No oropharyngeal exudate.  Eyes: Pupils are equal, round, and reactive to light.  Neck: Normal range of motion. Neck supple.  Cardiovascular: Normal rate and regular rhythm.  Pulmonary/Chest: No respiratory distress. He has no wheezes.  Decreased breath sounds at the bases bilaterally.  Abdominal: Soft. Bowel sounds are normal. He exhibits no distension and no mass. There is no tenderness. There is no rebound and no guarding.  Bilateral nephrostomy tubes.  Scrotal/penile/suprapubic swelling noted.  Musculoskeletal: Normal range of motion. He exhibits no edema or tenderness.  Swelling of the left lower extremity compared to the right.  Neurological: He is alert and oriented to person, place, and time.  Skin: Skin is warm.  Psychiatric: Affect normal.       LABORATORY DATA:  I have reviewed the data as listed    Component Value Date/Time   NA 137 03/23/2018 0621   NA 139 12/17/2012 0608   K 5.0 03/23/2018 0621   K 3.8 12/17/2012 0608   CL 106 03/23/2018 0621   CL 108 (H) 12/17/2012 0608   CO2 26 03/23/2018 0621   CO2 26 12/17/2012 0608   GLUCOSE 115 (H)  03/23/2018 0621   GLUCOSE 217 (H) 12/17/2012 0608   BUN 25 (H) 03/23/2018 0621   BUN 21 01/01/2017 1621   BUN 15 12/17/2012 0608   CREATININE 1.91 (H) 03/23/2018 0621   CREATININE 0.84 12/17/2012 0608   CALCIUM 8.6 (L) 03/23/2018 0621   CALCIUM 8.2 (L) 12/17/2012 0608   PROT 7.0 03/19/2018 1404   PROT 5.8 (L) 12/17/2012 0608   ALBUMIN 2.7 (L) 03/19/2018 1404   ALBUMIN 2.9 (L) 12/17/2012 0608   AST 23 03/19/2018  1404   AST 16 12/17/2012 0608   ALT 15 03/19/2018 1404   ALT 23 12/17/2012 0608   ALKPHOS 108 03/19/2018 1404   ALKPHOS 78 12/17/2012 0608   BILITOT 0.5 03/19/2018 1404   BILITOT 0.7 12/17/2012 0608   GFRNONAA 35 (L) 03/23/2018 0621   GFRNONAA >60 12/17/2012 0608   GFRAA 40 (L) 03/23/2018 0621   GFRAA >60 12/17/2012 0608    No results found for: SPEP, UPEP  Lab Results  Component Value Date   WBC 11.6 (H) 03/22/2018   NEUTROABS 10.6 (H) 03/19/2018   HGB 9.0 (L) 03/22/2018   HCT 28.6 (L) 03/22/2018   MCV 85.6 03/22/2018   PLT 298 03/22/2018      Chemistry      Component Value Date/Time   NA 137 03/23/2018 0621   NA 139 12/17/2012 0608   K 5.0 03/23/2018 0621   K 3.8 12/17/2012 0608   CL 106 03/23/2018 0621   CL 108 (H) 12/17/2012 0608   CO2 26 03/23/2018 0621   CO2 26 12/17/2012 0608   BUN 25 (H) 03/23/2018 0621   BUN 21 01/01/2017 1621   BUN 15 12/17/2012 0608   CREATININE 1.91 (H) 03/23/2018 0621   CREATININE 0.84 12/17/2012 0608      Component Value Date/Time   CALCIUM 8.6 (L) 03/23/2018 0621   CALCIUM 8.2 (L) 12/17/2012 0608   ALKPHOS 108 03/19/2018 1404   ALKPHOS 78 12/17/2012 0608   AST 23 03/19/2018 1404   AST 16 12/17/2012 0608   ALT 15 03/19/2018 1404   ALT 23 12/17/2012 0608   BILITOT 0.5 03/19/2018 1404   BILITOT 0.7 12/17/2012 0608       RADIOGRAPHIC STUDIES: I have personally reviewed the radiological images as listed and agreed with the findings in the report. No results found.   ASSESSMENT & PLAN:  Rectal cancer ([place]) #  RECURRENT RECTAL CANCER STAGE IV: Most recently on Lonsurf; February 13, 2018 CT scan shows progression of disease.  #Hold for the Lonsurf given poor tolerance/patient's concerns.  Patient continues to be not interested in further treatments-given his poor tolerance/incurable nature of disease.  # Chronic kidney disease/ bilateral hydronephrosis; creatinine stable 1.5-1.9.  Currently right-sided percutaneous nephrostomy tube; status post tubes exchange.   # poorly controlled pain-second malignancy/left lower extremity DVT-add fentanyl patch 50 mcg.  Continue breakthrough pain medication.  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  Awaiting work-up with Dr. Delana Meyer vascular.  #Follow-up with primary care in approximately 4 weeks.  # DISPOSITION: # follow up in 4 weeks- cbc/cmpcea   No orders of the defined types were placed in this encounter.  All questions were answered. The patient knows to call the clinic with any problems, questions or concerns.      Cammie Sickle, MD 04/01/2018 7:06 PM

## 2018-03-19 NOTE — Assessment & Plan Note (Addendum)
#   RECURRENT RECTAL CANCER STAGE IV: Most recently on Lonsurf; February 13, 2018 CT scan shows progression of disease.  #Hold for the Lonsurf given poor tolerance/patient's concerns.  Patient continues to be not interested in further treatments-given his poor tolerance/incurable nature of disease.  # Chronic kidney disease/ bilateral hydronephrosis; creatinine stable 1.5-1.9.  Currently right-sided percutaneous nephrostomy tube; status post tubes exchange.   # poorly controlled pain-second malignancy/left lower extremity DVT-add fentanyl patch 50 mcg.  Continue breakthrough pain medication.  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  Awaiting work-up with Dr. Delana Meyer vascular.  #Follow-up with primary care in approximately 4 weeks.  # DISPOSITION: # follow up in 4 weeks- cbc/cmpcea

## 2018-03-19 NOTE — Progress Notes (Signed)
[place]  [phone] [phone]   Name: Donald Townsend Date: 03/19/2018 MRN: [id number]  DOB: 02-12-1951  Patient Care Team: Dion Body, MD as PCP - General (Family Medicine) Clent Jacks, RN as Registered Nurse Cammie Sickle, MD as Medical Oncologist (Medical Oncology)    REASON FOR CONSULTATION: Palliative Care consult requested for this 67 y.o. 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  He was most recently treated with Lonsurf but has decided to forego future treatment.  Palliative care was consulted to help establish goals.  SOCIAL HISTORY:    Patient is divorced.  He lives at home alone.  Is a daughter who is involved in his care.  He also has a sister involved.  Patient worked as a Psychologist, sport and exercise.  ADVANCE DIRECTIVES:  On file  CODE STATUS: DNR  PAST MEDICAL HISTORY: Past Medical History:  Diagnosis Date  . A-fib ([place])   . Cancer [facility])    Colon  . CHF (congestive heart failure) ([place])   . Diabetes mellitus without complication ([place])   . Dyspnea   . Elevated lipids   . Hypertension   . PVD (peripheral vascular disease) ([place])     PAST SURGICAL HISTORY:  Past Surgical History:  Procedure Laterality Date  . COLON RESECTION     with colostomy  . COLONOSCOPY    . CYSTOSCOPY WITH STENT PLACEMENT Bilateral 01/18/2017   Procedure: CYSTOSCOPY WITH STENT PLACEMENT;  Surgeon: [name], MD;  Location: [facility];  Service: Urology;  Laterality: Bilateral;  . IR FLUORO GUIDE PORT INSERTION RIGHT  01/24/2017  . IR NEPHROSTOGRAM LEFT THRU EXISTING ACCESS  09/18/2017  . IR NEPHROSTOGRAM RIGHT THRU EXISTING ACCESS  09/18/2017  . IR NEPHROSTOMY EXCHANGE LEFT  03/14/2017  . IR NEPHROSTOMY EXCHANGE LEFT  05/03/2017  . IR NEPHROSTOMY  EXCHANGE LEFT  06/14/2017  . IR NEPHROSTOMY EXCHANGE LEFT  07/26/2017  . IR NEPHROSTOMY EXCHANGE LEFT  09/18/2017  . IR NEPHROSTOMY EXCHANGE LEFT  11/08/2017  . IR NEPHROSTOMY EXCHANGE LEFT  12/20/2017  . IR NEPHROSTOMY EXCHANGE LEFT  01/24/2018  . IR NEPHROSTOMY EXCHANGE LEFT  03/14/2018  . IR NEPHROSTOMY EXCHANGE RIGHT  03/14/2017  . IR NEPHROSTOMY EXCHANGE RIGHT  05/03/2017  . IR NEPHROSTOMY EXCHANGE RIGHT  06/14/2017  . IR NEPHROSTOMY EXCHANGE RIGHT  07/26/2017  . IR NEPHROSTOMY EXCHANGE RIGHT  10/04/2017  . IR NEPHROSTOMY EXCHANGE RIGHT  11/08/2017  . IR NEPHROSTOMY EXCHANGE RIGHT  12/20/2017  . IR NEPHROSTOMY EXCHANGE RIGHT  01/24/2018  . IR NEPHROSTOMY EXCHANGE RIGHT  03/14/2018  . IR NEPHROSTOMY PLACEMENT LEFT  01/19/2017  . IR NEPHROSTOMY PLACEMENT RIGHT  01/19/2017  . IVC FILTER REMOVAL N/A 08/28/2016   Procedure: IVC Filter Removal;  Surgeon: Katha Cabal, MD;  Location: [facility];  Service: Cardiovascular;  Laterality: N/A;  . LOWER EXTREMITY VENOGRAPHY Left 12/18/2016   Procedure: Lower Extremity Venography;  Surgeon: Katha Cabal, MD;  Location: [facility];  Service: Cardiovascular;  Laterality: Left;  . PERIPHERAL VASCULAR CATHETERIZATION Left 05/08/2016   Procedure: Lower Extremity Venography;  Surgeon: Katha Cabal, MD;  Location: [facility];  Service: Cardiovascular;  Laterality: Left;    HEMATOLOGY/ONCOLOGY HISTORY:  Oncology History   # 2012- RECTO-SIGMOID-poorly diff STAGE III CA [s/p neo-adj chemo-RT- poor response]; APR [residual ypT3; 5/8 LN  positiveDr.Smith/Dr.Pandit]  S/p FOLFOX   # SEP 2019- Pre-sacral fluid/mass- increasing ~ 5-6 cm [increased from dec 2017; also NEW retroperitoneal lymph nodes; inguinal adenopathy]; SEP 2018- left supraclavicular; mediastinal; right hilar-retroperitoneal; pelvic mass/node; bilateral inguinal adenopathy; s/p right Ingiunal LNBx - RECURRENT CA [necrotic;insuff tissue for F-One]  # OCT 8th  2018-FOLFIRI [first cycle 5FU alone]; OCT 22nd FOLFIRI; April 2019- significant PR  # MAY 2019- FOLFIRI q 3W [pt pref]; AUG 2019- CT Progression/ ingiunal LN; Aug 26th Lonsurf; STOPPED sep 2019- sec to into;/OCT 17th CT-Progression  ------------------------------  ---  OCT-NOV 2018-Sepsis x2 sec to UTI [s/p ABx;]  # s/p RT rectal [11/28-last treatment]  # Acute renal insuff [s/p bil PCN]; hematuria- sec to bladder invol [? Contra-indication to avastin]  # LEFT LE DVT [? [facility]- Dr.Schneir]; Eliquis.   # CHF/COPD/CKD/ PN sec to [place]  # Foundation One-PDL-1/ TPS- 0% [2012-rectal specimen];12/26-Omniseq- K-RAS MUTATED;MSS; no targets** ---------------------------------------------   DIAGNOSIS: rectal cancer  STAGE:   IV  ; GOALS: palliative  CURRENT/MOST RECENT THERAPY: LONSURF [AUG  26th]      Rectal cancer (HCC)    ALLERGIES:  has No Known Allergies.  MEDICATIONS:  Current Outpatient Medications  Medication Sig Dispense Refill  . B-D ULTRAFINE III SHORT PEN 31G X 8 MM MISC     . Continuous Blood Gluc Receiver (FREESTYLE LIBRE READER) DEVI Use 1 Units as directed. Check CBG's four times daily. Dx: E11.9    . Continuous Blood Gluc Sensor (FREESTYLE LIBRE SENSOR SYSTEM) MISC Use 1 Units as directed. Check CBG's four times daily. Dx: E11.9    . DIGOX 250 MCG tablet Take 0.25 mg by mouth daily.     . ELIQUIS 5 MG TABS tablet Take 5 mg by mouth every 12 (twelve) hours.  0  . fentaNYL (DURAGESIC - DOSED MCG/HR) 25 MCG/HR patch Place 1 patch (25 mcg total) onto the skin every 3 (three) days. 10 patch 0  . furosemide (LASIX) 40 MG tablet Take 40 mg by mouth daily.   0  . glimepiride (AMARYL) 4 MG tablet Take 1 tablet by mouth 2 (two) times daily.     . HYDROcodone-acetaminophen (NORCO) 10-325 MG tablet Take 1 tablet by mouth every 4 (four) hours as needed for moderate pain or severe pain. 60 tablet 0  . insulin aspart (NOVOLOG FLEXPEN) 100 UNIT/ML FlexPen Inject 3-15 Units  into the skin 3 (three) times daily with meals. Sliding scale    . LANTUS SOLOSTAR 100 UNIT/ML Solostar Pen Inject 20 Units into the skin at bedtime.   0  . losartan (COZAAR) 100 MG tablet Take 100 mg by mouth daily.    . metoprolol tartrate (LOPRESSOR) 25 MG tablet Take 25 mg by mouth 2 (two) times daily.     . pravastatin (PRAVACHOL) 20 MG tablet Take 20 mg by mouth daily.     . sulfamethoxazole-trimethoprim (BACTRIM DS,SEPTRA DS) 800-160 MG tablet Take 1 tablet by mouth daily.      No current facility-administered medications for this visit.     VITAL SIGNS: There were no vitals taken for this visit. There were no vitals filed for this visit.  Estimated body mass index is 33.38 kg/m as calculated from the following:   Height as of 03/11/18: 5' 10.5" (1.791 m).   Weight as of an earlier encounter on 03/19/18: 236 lb (107 kg).  LABS: CBC:    Component Value Date/Time   WBC 13.7 (H) 03/19/2018 1404   HGB 9.8 (L) 03/19/2018 1404     HGB 13.9 12/17/2012 0608   HCT 31.6 (L) 03/19/2018 1404   HCT 38.8 (L) 12/17/2012 0608   PLT 358 03/19/2018 1404   PLT 147 (L) 12/17/2012 0608   MCV 85.6 03/19/2018 1404   MCV 87 12/17/2012 0608   NEUTROABS 10.6 (H) 03/19/2018 1404   NEUTROABS 4.7 12/17/2012 0608   LYMPHSABS 1.0 03/19/2018 1404   LYMPHSABS 0.8 (L) 12/17/2012 0608   MONOABS 0.7 03/19/2018 1404   MONOABS 0.5 12/17/2012 0608   EOSABS 1.3 (H) 03/19/2018 1404   EOSABS 0.1 12/17/2012 0608   BASOSABS 0.1 03/19/2018 1404   BASOSABS 0.0 12/17/2012 0608   Comprehensive Metabolic Panel:    Component Value Date/Time   NA 131 (L) 03/19/2018 1404   NA 139 12/17/2012 0608   K 4.5 03/19/2018 1404   K 3.8 12/17/2012 0608   CL 99 03/19/2018 1404   CL 108 (H) 12/17/2012 0608   CO2 19 (L) 03/19/2018 1404   CO2 26 12/17/2012 0608   BUN 25 (H) 03/19/2018 1404   BUN 21 01/01/2017 1621   BUN 15 12/17/2012 0608   CREATININE 1.78 (H) 03/19/2018 1404   CREATININE 0.84 12/17/2012 0608   GLUCOSE  237 (H) 03/19/2018 1404   GLUCOSE 217 (H) 12/17/2012 0608   CALCIUM 8.6 (L) 03/19/2018 1404   CALCIUM 8.2 (L) 12/17/2012 0608   AST 23 03/19/2018 1404   AST 16 12/17/2012 0608   ALT 15 03/19/2018 1404   ALT 23 12/17/2012 0608   ALKPHOS 108 03/19/2018 1404   ALKPHOS 78 12/17/2012 0608   BILITOT 0.5 03/19/2018 1404   BILITOT 0.7 12/17/2012 0608   PROT 7.0 03/19/2018 1404   PROT 5.8 (L) 12/17/2012 0608   ALBUMIN 2.7 (L) 03/19/2018 1404   ALBUMIN 2.9 (L) 12/17/2012 0608    RADIOGRAPHIC STUDIES: Ir Nephrostomy Exchange Left  Result Date: 03/14/2018 INDICATION: History of rectal cancer with chronic bilateral percutaneous nephrostomy catheters. Patient presents today for routine fluoroscopic guided nephrostomy catheter exchange. EXAM: FLUOROSCOPIC GUIDED BILATERAL SIDED NEPHROSTOMY CATHETER EXCHANGE COMPARISON:  None. CONTRAST:  A total of 20 mL Isovue-300 administered was administered into both collecting systems FLUOROSCOPY TIME:  36 seconds (40 mGy) COMPLICATIONS: None immediate. TECHNIQUE: Informed written consent was obtained from the patient after a discussion of the risks, benefits and alternatives to treatment. Questions regarding the procedure were encouraged and answered. A timeout was performed prior to the initiation of the procedure. The bilateral flanks and external portions of existing nephrostomy catheters were prepped and draped in the usual sterile fashion. A sterile drape was applied covering the operative field. Maximum barrier sterile technique with sterile gowns and gloves were used for the procedure. A timeout was performed prior to the initiation of the procedure. A pre procedural spot fluoroscopic image was obtained. Beginning with the left-sided nephrostomy, a small amount of contrast was injected via the existing left-sided nephrostomy catheter demonstrating appropriate positioning within the renal pelvis. The existing nephrostomy catheter was cut and cannulated with a  Benson wire which was coiled within the renal pelvis. Under intermittent fluoroscopic guidance, the existing nephrostomy catheter was exchanged for a new 10.2 French all-purpose drainage catheter. Limited contrast injection confirmed appropriate positioning within the left renal pelvis and a post exchange fluoroscopic image was obtained. The catheter was locked and reconnected to a gravity bag. The identical repeat procedure was repeated for the contralateral right-sided nephrostomy, ultimately allowing successful exchange of a new 10.2 French all-purpose drainage catheter with end coiled and locked within the right renal pelvis. Dressings   were placed. The patient tolerated the above procedures well without immediate postprocedural complication. FINDINGS: The existing nephrostomy catheters are appropriately positioned and functioning. After successful fluoroscopic guided exchange, new bilateral 10.2 French nephrostomy catheters are coiled and locked within the respective renal pelvises. IMPRESSION: Successful fluoroscopic guided exchange of bilateral 10.2 French percutaneous nephrostomy catheters. Electronically Signed   By: John  Watts M.D.   On: 03/14/2018 12:42   Ir Nephrostomy Exchange Right  Result Date: 03/14/2018 INDICATION: History of rectal cancer with chronic bilateral percutaneous nephrostomy catheters. Patient presents today for routine fluoroscopic guided nephrostomy catheter exchange. EXAM: FLUOROSCOPIC GUIDED BILATERAL SIDED NEPHROSTOMY CATHETER EXCHANGE COMPARISON:  None. CONTRAST:  A total of 20 mL Isovue-300 administered was administered into both collecting systems FLUOROSCOPY TIME:  36 seconds (40 mGy) COMPLICATIONS: None immediate. TECHNIQUE: Informed written consent was obtained from the patient after a discussion of the risks, benefits and alternatives to treatment. Questions regarding the procedure were encouraged and answered. A timeout was performed prior to the initiation of the  procedure. The bilateral flanks and external portions of existing nephrostomy catheters were prepped and draped in the usual sterile fashion. A sterile drape was applied covering the operative field. Maximum barrier sterile technique with sterile gowns and gloves were used for the procedure. A timeout was performed prior to the initiation of the procedure. A pre procedural spot fluoroscopic image was obtained. Beginning with the left-sided nephrostomy, a small amount of contrast was injected via the existing left-sided nephrostomy catheter demonstrating appropriate positioning within the renal pelvis. The existing nephrostomy catheter was cut and cannulated with a Benson wire which was coiled within the renal pelvis. Under intermittent fluoroscopic guidance, the existing nephrostomy catheter was exchanged for a new 10.2 French all-purpose drainage catheter. Limited contrast injection confirmed appropriate positioning within the left renal pelvis and a post exchange fluoroscopic image was obtained. The catheter was locked and reconnected to a gravity bag. The identical repeat procedure was repeated for the contralateral right-sided nephrostomy, ultimately allowing successful exchange of a new 10.2 French all-purpose drainage catheter with end coiled and locked within the right renal pelvis. Dressings were placed. The patient tolerated the above procedures well without immediate postprocedural complication. FINDINGS: The existing nephrostomy catheters are appropriately positioned and functioning. After successful fluoroscopic guided exchange, new bilateral 10.2 French nephrostomy catheters are coiled and locked within the respective renal pelvises. IMPRESSION: Successful fluoroscopic guided exchange of bilateral 10.2 French percutaneous nephrostomy catheters. Electronically Signed   By: John  Watts M.D.   On: 03/14/2018 12:42    PERFORMANCE STATUS (ECOG) : 1 - Symptomatic but completely ambulatory  Review of  Systems As noted above. Otherwise, a complete review of systems is negative.  Physical Exam General: NAD, frail appearing Pulmonary: unlabored Extremities: Bilateral lower extremity edema but significantly increased on left side Skin: some excoriation to suprapubic and left inguinal area Neurological: Weakness but otherwise nonfocal  IMPRESSION: Follow up visit with patient and his brother. Patient reports worsening L. Leg and groin pain. This is associated with worsened edema to the same extremity. Patient has seen vascular and has a venogram scheduled on 03/21/18.   Case discussed with Dr. B. Patient has been started on transdermal fentanyl 25mcg Q72H. I discussed this with patient and answered his questions regarding the medication. We also talked about continued use of prn Norco for BTP. Prophylactic bowel regimen was also discussed.   Patient denies other symptoms. All questions answered.   He has a f/u appointment to see Dr. B in 4   weeks. Will see him again in 2 weeks. I instructed patient to call if pain was still severe next week and I can see him sooner.   PLAN: Continue supportive care Agree with TD fentanyl Continue prn Norco for BTP Recommend hospice if patient experiences future decline RTC in 2 weeks   Patient expressed understanding and was in agreement with this plan. He also understands that He can call clinic at any time with any questions, concerns, or complaints.    Time Total: 15 minutes  Visit consisted of counseling and education dealing with the complex and emotionally intense issues of symptom management and palliative care in the setting of serious and potentially life-threatening illness.Greater than 50%  of this time was spent counseling and coordinating care related to the above assessment and plan.  Signed by: Altha Harm, PhD, DNP, NP-C, [facility] [phone] (Work Cell)

## 2018-03-20 LAB — CEA: CEA: 67 ng/mL — ABNORMAL HIGH (ref 0.0–4.7)

## 2018-03-21 ENCOUNTER — Inpatient Hospital Stay
Admission: RE | Admit: 2018-03-21 | Discharge: 2018-03-23 | DRG: 271 | Disposition: A | Discharge status: Home / Self Care | Payer: Medicare Other | Attending: Internal Medicine | Admitting: Internal Medicine

## 2018-03-21 ENCOUNTER — Encounter
Admission: RE | Disposition: A | Discharge status: Home / Self Care | Payer: Self-pay | Source: Home / Self Care | Attending: Vascular Surgery

## 2018-03-21 ENCOUNTER — Encounter: Payer: Self-pay | Admitting: *Deleted

## 2018-03-21 ENCOUNTER — Other Ambulatory Visit: Payer: Self-pay

## 2018-03-21 DIAGNOSIS — N179 Acute kidney failure, unspecified: Secondary | ICD-10-CM | POA: Diagnosis present

## 2018-03-21 DIAGNOSIS — C20 Malignant neoplasm of rectum: Secondary | ICD-10-CM | POA: Diagnosis present

## 2018-03-21 DIAGNOSIS — E1151 Type 2 diabetes mellitus with diabetic peripheral angiopathy without gangrene: Secondary | ICD-10-CM | POA: Diagnosis present

## 2018-03-21 DIAGNOSIS — I130 Hypertensive heart and chronic kidney disease with heart failure and stage 1 through stage 4 chronic kidney disease, or unspecified chronic kidney disease: Secondary | ICD-10-CM | POA: Diagnosis present

## 2018-03-21 DIAGNOSIS — E119 Type 2 diabetes mellitus without complications: Secondary | ICD-10-CM | POA: Diagnosis not present

## 2018-03-21 DIAGNOSIS — Z515 Encounter for palliative care: Secondary | ICD-10-CM | POA: Diagnosis present

## 2018-03-21 DIAGNOSIS — Z9221 Personal history of antineoplastic chemotherapy: Secondary | ICD-10-CM

## 2018-03-21 DIAGNOSIS — Z23 Encounter for immunization: Secondary | ICD-10-CM

## 2018-03-21 DIAGNOSIS — Z8042 Family history of malignant neoplasm of prostate: Secondary | ICD-10-CM

## 2018-03-21 DIAGNOSIS — Z7901 Long term (current) use of anticoagulants: Secondary | ICD-10-CM

## 2018-03-21 DIAGNOSIS — I509 Heart failure, unspecified: Secondary | ICD-10-CM | POA: Diagnosis present

## 2018-03-21 DIAGNOSIS — R0902 Hypoxemia: Secondary | ICD-10-CM | POA: Diagnosis present

## 2018-03-21 DIAGNOSIS — Z933 Colostomy status: Secondary | ICD-10-CM

## 2018-03-21 DIAGNOSIS — N183 Chronic kidney disease, stage 3 (moderate): Secondary | ICD-10-CM | POA: Diagnosis present

## 2018-03-21 DIAGNOSIS — Z823 Family history of stroke: Secondary | ICD-10-CM

## 2018-03-21 DIAGNOSIS — I480 Paroxysmal atrial fibrillation: Secondary | ICD-10-CM | POA: Diagnosis not present

## 2018-03-21 DIAGNOSIS — Z833 Family history of diabetes mellitus: Secondary | ICD-10-CM

## 2018-03-21 DIAGNOSIS — Z923 Personal history of irradiation: Secondary | ICD-10-CM

## 2018-03-21 DIAGNOSIS — E1122 Type 2 diabetes mellitus with diabetic chronic kidney disease: Secondary | ICD-10-CM | POA: Diagnosis present

## 2018-03-21 DIAGNOSIS — Z66 Do not resuscitate: Secondary | ICD-10-CM | POA: Diagnosis present

## 2018-03-21 DIAGNOSIS — G709 Myoneural disorder, unspecified: Secondary | ICD-10-CM

## 2018-03-21 DIAGNOSIS — I82412 Acute embolism and thrombosis of left femoral vein: Principal | ICD-10-CM | POA: Diagnosis present

## 2018-03-21 DIAGNOSIS — I82220 Acute embolism and thrombosis of inferior vena cava: Secondary | ICD-10-CM | POA: Diagnosis present

## 2018-03-21 DIAGNOSIS — Z803 Family history of malignant neoplasm of breast: Secondary | ICD-10-CM

## 2018-03-21 DIAGNOSIS — I82422 Acute embolism and thrombosis of left iliac vein: Secondary | ICD-10-CM | POA: Diagnosis not present

## 2018-03-21 DIAGNOSIS — I82409 Acute embolism and thrombosis of unspecified deep veins of unspecified lower extremity: Secondary | ICD-10-CM | POA: Diagnosis not present

## 2018-03-21 DIAGNOSIS — I4891 Unspecified atrial fibrillation: Secondary | ICD-10-CM | POA: Diagnosis present

## 2018-03-21 DIAGNOSIS — I82402 Acute embolism and thrombosis of unspecified deep veins of left lower extremity: Secondary | ICD-10-CM | POA: Diagnosis present

## 2018-03-21 HISTORY — PX: LOWER EXTREMITY VENOGRAPHY: CATH118253

## 2018-03-21 LAB — GLUCOSE, CAPILLARY
GLUCOSE-CAPILLARY: 249 mg/dL — AB (ref 70–99)
GLUCOSE-CAPILLARY: 167 mg/dL — AB (ref 70–99)
GLUCOSE-CAPILLARY: 161 mg/dL — AB (ref 70–99)
Glucose-Capillary: 134 mg/dL — ABNORMAL HIGH (ref 70–99)

## 2018-03-21 LAB — HEPARIN LEVEL (UNFRACTIONATED): Heparin Unfractionated: 0.12 IU/mL — ABNORMAL LOW (ref 0.30–0.70)

## 2018-03-21 LAB — APTT: aPTT: 108 seconds — ABNORMAL HIGH (ref 24–36)

## 2018-03-21 LAB — PROTIME-INR
INR: 1.29
Prothrombin Time: 16 seconds — ABNORMAL HIGH (ref 11.4–15.2)

## 2018-03-21 SURGERY — LOWER EXTREMITY VENOGRAPHY
Anesthesia: Moderate Sedation | Laterality: Left

## 2018-03-21 MED ORDER — INSULIN ASPART 100 UNIT/ML ~~LOC~~ SOLN: 0.0000 [IU] | Freq: Every day | SUBCUTANEOUS | Status: DC

## 2018-03-21 MED ORDER — FENTANYL CITRATE (PF) 100 MCG/2ML IJ SOLN
INTRAMUSCULAR | Status: DC | PRN
  Administered 2018-03-21 (×2): 50 ug via INTRAVENOUS
  Administered 2018-03-21: 25 ug via INTRAVENOUS
  Administered 2018-03-21 (×2): 50 ug via INTRAVENOUS

## 2018-03-21 MED ORDER — ONDANSETRON HCL 4 MG PO TABS: 4.0000 mg | ORAL_TABLET | Freq: Four times a day (QID) | ORAL | Status: DC | PRN

## 2018-03-21 MED ORDER — SORBITOL 70 % SOLN
30.0000 mL | Freq: Every day | Status: DC | PRN
  Filled 2018-03-21: qty 30

## 2018-03-21 MED ORDER — ALTEPLASE 2 MG IJ SOLR
INTRAMUSCULAR | Status: DC | PRN
  Administered 2018-03-21: 8 mg

## 2018-03-21 MED ORDER — INSULIN GLARGINE 100 UNIT/ML ~~LOC~~ SOLN
20.0000 [IU] | Freq: Every day | SUBCUTANEOUS | Status: DC
  Administered 2018-03-21 – 2018-03-22 (×2): 20 [IU] via SUBCUTANEOUS
  Filled 2018-03-21 (×3): qty 0.2

## 2018-03-21 MED ORDER — MIDAZOLAM HCL 2 MG/2ML IJ SOLN
INTRAMUSCULAR | Status: AC
  Filled 2018-03-21: qty 4

## 2018-03-21 MED ORDER — LIDOCAINE HCL (PF) 1 % IJ SOLN
INTRAMUSCULAR | Status: AC
  Filled 2018-03-21: qty 30

## 2018-03-21 MED ORDER — HEPARIN (PORCINE) 25000 UT/250ML-% IV SOLN
2000.0000 [IU]/h | INTRAVENOUS | Status: DC
  Administered 2018-03-21: 1550 [IU]/h via INTRAVENOUS
  Administered 2018-03-22: 1700 [IU]/h via INTRAVENOUS
  Filled 2018-03-21: qty 250

## 2018-03-21 MED ORDER — HYDROCODONE-ACETAMINOPHEN 10-325 MG PO TABS
1.0000 | ORAL_TABLET | ORAL | Status: DC | PRN
  Administered 2018-03-21 – 2018-03-23 (×7): 1 via ORAL
  Filled 2018-03-21 (×8): qty 1

## 2018-03-21 MED ORDER — CEFAZOLIN SODIUM-DEXTROSE 2-4 GM/100ML-% IV SOLN
INTRAVENOUS | Status: AC
  Filled 2018-03-21: qty 100

## 2018-03-21 MED ORDER — FLEET ENEMA 7-19 GM/118ML RE ENEM: 1.0000 | ENEMA | Freq: Once | RECTAL | Status: DC | PRN

## 2018-03-21 MED ORDER — DIGOXIN 250 MCG PO TABS
0.2500 mg | ORAL_TABLET | Freq: Every day | ORAL | Status: DC
  Administered 2018-03-22 – 2018-03-23 (×2): 0.25 mg via ORAL
  Filled 2018-03-21 (×2): qty 1

## 2018-03-21 MED ORDER — METOPROLOL TARTRATE 50 MG PO TABS
25.0000 mg | ORAL_TABLET | Freq: Two times a day (BID) | ORAL | Status: DC
  Administered 2018-03-21 – 2018-03-22 (×2): 25 mg via ORAL
  Filled 2018-03-21 (×4): qty 1

## 2018-03-21 MED ORDER — HEPARIN (PORCINE) IN NACL 1000-0.9 UT/500ML-% IV SOLN
INTRAVENOUS | Status: AC
  Filled 2018-03-21: qty 1000

## 2018-03-21 MED ORDER — DOCUSATE SODIUM 100 MG PO CAPS
100.0000 mg | ORAL_CAPSULE | Freq: Two times a day (BID) | ORAL | Status: DC
  Administered 2018-03-21 – 2018-03-23 (×4): 100 mg via ORAL
  Filled 2018-03-21 (×4): qty 1

## 2018-03-21 MED ORDER — IOPAMIDOL (ISOVUE-300) INJECTION 61%
INTRAVENOUS | Status: DC | PRN
  Administered 2018-03-21: 40 mL via INTRAVENOUS

## 2018-03-21 MED ORDER — INSULIN ASPART 100 UNIT/ML ~~LOC~~ SOLN
0.0000 [IU] | Freq: Three times a day (TID) | SUBCUTANEOUS | Status: DC
  Administered 2018-03-22: 3 [IU] via SUBCUTANEOUS
  Administered 2018-03-22: 2 [IU] via SUBCUTANEOUS
  Filled 2018-03-21 (×2): qty 1

## 2018-03-21 MED ORDER — HEPARIN SODIUM (PORCINE) 1000 UNIT/ML IJ SOLN
INTRAMUSCULAR | Status: DC | PRN
  Administered 2018-03-21: 3000 [IU] via INTRAVENOUS
  Administered 2018-03-21: 2000 [IU] via INTRAVENOUS

## 2018-03-21 MED ORDER — HEPARIN (PORCINE) 25000 UT/250ML-% IV SOLN
INTRAVENOUS | Status: AC
  Administered 2018-03-21: 4800 [IU] via INTRAVENOUS
  Filled 2018-03-21: qty 250

## 2018-03-21 MED ORDER — HYDROMORPHONE HCL 1 MG/ML IJ SOLN
INTRAMUSCULAR | Status: AC
  Administered 2018-03-21: 1 mg via INTRAVENOUS
  Filled 2018-03-21: qty 1

## 2018-03-21 MED ORDER — INSULIN ASPART 100 UNIT/ML ~~LOC~~ SOLN: 3.0000 [IU] | Freq: Three times a day (TID) | SUBCUTANEOUS | Status: DC

## 2018-03-21 MED ORDER — PRAVASTATIN SODIUM 20 MG PO TABS
20.0000 mg | ORAL_TABLET | Freq: Every day | ORAL | Status: DC
  Administered 2018-03-22 – 2018-03-23 (×2): 20 mg via ORAL
  Filled 2018-03-21 (×2): qty 1

## 2018-03-21 MED ORDER — HEPARIN BOLUS VIA INFUSION
4800.0000 [IU] | Freq: Once | INTRAVENOUS | Status: AC
  Administered 2018-03-21: 4800 [IU] via INTRAVENOUS

## 2018-03-21 MED ORDER — HEPARIN SODIUM (PORCINE) 1000 UNIT/ML IJ SOLN
INTRAMUSCULAR | Status: AC
  Filled 2018-03-21: qty 1

## 2018-03-21 MED ORDER — FENTANYL CITRATE (PF) 100 MCG/2ML IJ SOLN
INTRAMUSCULAR | Status: AC
  Filled 2018-03-21: qty 2

## 2018-03-21 MED ORDER — MIDAZOLAM HCL 2 MG/2ML IJ SOLN
INTRAMUSCULAR | Status: AC
  Filled 2018-03-21: qty 2

## 2018-03-21 MED ORDER — HYDROMORPHONE HCL 1 MG/ML IJ SOLN
1.0000 mg | Freq: Once | INTRAMUSCULAR | Status: AC | PRN
  Administered 2018-03-21: 1 mg via INTRAVENOUS

## 2018-03-21 MED ORDER — SODIUM CHLORIDE 0.9 % IV SOLN: INTRAVENOUS | Status: DC

## 2018-03-21 MED ORDER — LOSARTAN POTASSIUM 50 MG PO TABS
100.0000 mg | ORAL_TABLET | Freq: Every day | ORAL | Status: DC
  Administered 2018-03-22: 100 mg via ORAL
  Filled 2018-03-21 (×2): qty 2

## 2018-03-21 MED ORDER — ONDANSETRON HCL 4 MG/2ML IJ SOLN: 4.0000 mg | Freq: Four times a day (QID) | INTRAMUSCULAR | Status: DC | PRN

## 2018-03-21 MED ORDER — MAGNESIUM HYDROXIDE 400 MG/5ML PO SUSP
30.0000 mL | Freq: Every day | ORAL | Status: DC | PRN
  Filled 2018-03-21: qty 30

## 2018-03-21 MED ORDER — HYDROMORPHONE HCL 1 MG/ML IJ SOLN
1.0000 mg | INTRAMUSCULAR | Status: DC | PRN
  Administered 2018-03-21 – 2018-03-23 (×6): 1 mg via INTRAVENOUS
  Filled 2018-03-21 (×6): qty 1

## 2018-03-21 MED ORDER — INFLUENZA VAC SPLIT HIGH-DOSE 0.5 ML IM SUSY
0.5000 mL | PREFILLED_SYRINGE | INTRAMUSCULAR | Status: AC
  Administered 2018-03-23: 0.5 mL via INTRAMUSCULAR
  Filled 2018-03-21: qty 0.5

## 2018-03-21 MED ORDER — FENTANYL 25 MCG/HR TD PT72
25.0000 ug | MEDICATED_PATCH | TRANSDERMAL | Status: DC
  Administered 2018-03-22: 25 ug via TRANSDERMAL
  Filled 2018-03-21: qty 1

## 2018-03-21 MED ORDER — SULFAMETHOXAZOLE-TRIMETHOPRIM 800-160 MG PO TABS
1.0000 | ORAL_TABLET | Freq: Every day | ORAL | Status: DC
  Administered 2018-03-22 – 2018-03-23 (×2): 1 via ORAL
  Filled 2018-03-21 (×2): qty 1

## 2018-03-21 MED ORDER — DEXTROSE 5 % IV SOLN
2.0000 g | Freq: Once | INTRAVENOUS | Status: AC
  Administered 2018-03-21: 2 g via INTRAVENOUS
  Filled 2018-03-21: qty 20

## 2018-03-21 MED ORDER — GLIMEPIRIDE 4 MG PO TABS
4.0000 mg | ORAL_TABLET | Freq: Two times a day (BID) | ORAL | Status: DC
  Administered 2018-03-21: 4 mg via ORAL
  Filled 2018-03-21 (×2): qty 1

## 2018-03-21 MED ORDER — FUROSEMIDE 40 MG PO TABS
40.0000 mg | ORAL_TABLET | Freq: Every day | ORAL | Status: DC
  Administered 2018-03-22: 40 mg via ORAL
  Filled 2018-03-21: qty 1

## 2018-03-21 MED ORDER — MIDAZOLAM HCL 2 MG/2ML IJ SOLN
INTRAMUSCULAR | Status: DC | PRN
  Administered 2018-03-21: 1 mg via INTRAVENOUS
  Administered 2018-03-21: 2 mg via INTRAVENOUS
  Administered 2018-03-21 (×4): 1 mg via INTRAVENOUS

## 2018-03-21 SURGICAL SUPPLY — 20 items
BALLN ATG 14X6X80 (BALLOONS) ×2
BALLN LUTONIX AV 12X40X75 (BALLOONS) ×2
BALLOON ATG 14X6X80 (BALLOONS) ×1 IMPLANT
BALLOON LUTONIX AV 12X40X75 (BALLOONS) ×1 IMPLANT
CANISTER PENUMBRA ENGINE (MISCELLANEOUS) ×2 IMPLANT
CATH BEACON 5 .035 65 KMP TIP (CATHETERS) ×2 IMPLANT
CATH INDIGO CAT8 TORQ 85 KIT (CATHETERS) ×2 IMPLANT
CATH INFUS 135CMX50CM (CATHETERS) ×2 IMPLANT
COVER PROBE U/S 5X48 (MISCELLANEOUS) ×2 IMPLANT
DEVICE PRESTO INFLATION (MISCELLANEOUS) ×2 IMPLANT
NEEDLE ENTRY 21GA 7CM ECHOTIP (NEEDLE) ×2 IMPLANT
SET INTRO CAPELLA COAXIAL (SET/KITS/TRAYS/PACK) ×2 IMPLANT
SHEATH 9FRX11 (SHEATH) ×2 IMPLANT
SHEATH BRITE TIP 8FRX11 (SHEATH) ×2 IMPLANT
SHIELD RADPAD SCOOP 12X17 (MISCELLANEOUS) ×2 IMPLANT
STENT VENOVO 14X60X80 (Permanent Stent) ×2 IMPLANT
TOWEL OR 17X26 4PK STRL BLUE (TOWEL DISPOSABLE) ×2 IMPLANT
WIRE AQUATRACK .035X260CM (WIRE) ×2 IMPLANT
WIRE MAGIC TORQUE 260C (WIRE) ×2 IMPLANT
WIRE SPARTACORE .014X300CM (WIRE) ×2 IMPLANT

## 2018-03-21 NOTE — Consult Note (Signed)
ANTICOAGULATION CONSULT NOTE - Initial Consult  Pharmacy Consult for heparin Indication: DVT  No Known Allergies  Patient Measurements: Height: 5' 10.5" (179.1 cm) Weight: 235 lb 14.3 oz (107 kg) IBW/kg (Calculated) : 74.15 Heparin Dosing Weight: 97 kg  Vital Signs: Temp: 98.1 F (36.7 C) (11/22 1137) Temp Source: Oral (11/22 1137) BP: 117/53 (11/22 1530) Pulse Rate: 88 (11/22 1530)  Labs: Recent Labs    03/19/18 1404  HGB 9.8*  HCT 31.6*  PLT 358  CREATININE 1.78*    Estimated Creatinine Clearance: 49.7 mL/min (A) (by C-G formula based on SCr of 1.78 mg/dL (H)).   Medical History: Past Medical History:  Diagnosis Date  . A-fib ([place])   . Cancer [facility])    Colon  . CHF (congestive heart failure) ([place])   . Diabetes mellitus without complication ([place])   . Dyspnea   . Elevated lipids   . Hypertension   . PVD (peripheral vascular disease) (H[facility])     Medications:  Scheduled:  . digoxin  0.25 mg Oral Daily  . docusate sodium  100 mg Oral BID  . fentaNYL  25 mcg Transdermal Q72H  . furosemide  40 mg Oral Daily  . glimepiride  4 mg Oral BID  . HYDROmorphone      . insulin aspart  3-15 Units Subcutaneous TID WC  . Insulin Glargine  20 Units Subcutaneous QHS  . losartan  100 mg Oral Daily  . metoprolol tartrate  25 mg Oral BID  . pravastatin  20 mg Oral Daily  . sulfamethoxazole-trimethoprim  1 tablet Oral Daily    Assessment: 67 yo male had thrombectomy for DVT on 11/22. Patient also has history of stage IV rectal cancer and CKD. Patient was on apixaban 5 mg BID for DVT, last dose was 11/20.   Goal of Therapy:  Heparin level 0.3-0.7 units/ml Monitor platelets by anticoagulation protocol: Yes   Plan:  Ordered baseline labs. Ordered heparin bolus of 4800 units, followed by continuous infusion of 1550 units/hr. Will order heparin level for 2200.   Pharmacy will continue to monitor.   Paticia Stack, PharmD Pharmacy Resident  03/21/2018 3:52 PM

## 2018-03-21 NOTE — Op Note (Signed)
[facility]                                                                               OPERATIVE NOTE    PRE-OPERATIVE DIAGNOSIS: Symptomatic left leg DVT; rectal cancer  POST-OPERATIVE DIAGNOSIS: Same  PROCEDURE: 1. Ultrasound guidance for vascular access to the left popliteal vein 2. Introduction catheter into venous system second order catheter placement left popliteal approach 3. Inferior venacavogram with left leg venougram 4. Infusion thrombolysis with 8 mg of TPA 5. Mechanical thrombectomy of the left  SFV, common femoral vein and iliac veins using the  Penumbra Cat 8 device 6. PTA and Stent left external iliac vein    SURGEON: Hortencia Pilar  ASSISTANT(S): None  ANESTHESIA: Conscious sedation was administered by the interventional radiology RN under my direct supervision. IV Versed plus fentanyl were utilized. Continuous ECG, pulse oximetry and blood pressure was monitored throughout the entire procedure.  Conscious sedation was administered for a total of 78 minutes.  ESTIMATED BLOOD LOSS: minimal  Contrast:40 cc  Fluoroscopy time:4.9 minutes  FINDING(S): 1. Patent IVC; thrombus within the left superficial femoral vein, common femoral vein, external iliac vein and common iliac vein.  Previously placed stent was occluded.  SPECIMEN(S): none  INDICATIONS:  Donald Townsend is a 67 y.o. year old male who presents with massive swelling of the left leg quite painful in association with pleuritic chest pains and hypoxia as well as shortness of breath. Patient's left leg is quite painful and symptomatic and he is requested treatment.  Risks and benefits for angiography thrombectomy intervention were reviewed all questions been answered patient agrees to proceed.   DESCRIPTION: After obtaining full informed written consent, the patient was brought back to the  vascular suite.  The patient was then positioned to the prone and the popliteal fossa of the left leg was prepped and draped in a sterile fashion. Ultrasound was placed in a sterile sleeve. Ultrasound is utilized to gain access to the popliteal vein. Vein is known to have thrombus within it by previous ultrasound. It is therefore identified by its enlarged size with heterogenous thrombus and non-compressibility. 1% lidocaine is infiltrated in soft tissues and subsequent microneedle is inserted into the popliteal vein without difficulty. Images recorded for the permanent record and the puncture is made under real-time visualization. Microwire is then advanced under fluoroscopic guidance and noted to follow the path of the superficial femoral vein. Therefore the micro-sheath was placed followed by a aqua track wire and subsequently an 8 [place] sheath.  KMP catheter together with the aqua track wire then advanced up to the level of the proximal common iliac and hand injection contrast is utilized to demonstrate that the inferior vena cava is patent. Catheter is then repositioned to the common femoral level and hand injection contrast demonstrates  that there is occlusive thrombus within the common and external iliac veins.  Previously placed stent is occluded.  The common femoral vein as well as the superficial femoral vein also demonstrate occlusive thrombus.    And an infusion catheter with a 50 cm infusion length is then prepped on the field then advanced over the wire and positioned with its tip at the confluence of the iliac veins.  A total of 8 mg of TPA is reconstituted 25 cc. This is then laced throughout the iliac venous system as well as the common femoral superficial femoral and proximal popliteal veins. The TPA is then allowed to dwell. Subsequently, the penumbra cat 8 catheter is engaged in the aspiration mode and aspiration of the popliteal, superficial femoral, common femoral, external iliac vein as  well as the common iliac vein is performed.  Multiple passes are made with the volumes recorded in the procedure notes.  Follow-up imaging now demonstrates that almost all the thrombus is been eliminated from the proximal popliteal as well as the superficial femoral vein as well as the iliac system. There does not appear to be any abnormality of the vein such as a stricture or stenosis within the femoral-popliteal system. Within the external iliac vein there is a high-grade residual narrowing just before the leading edge of the stent.  This stricture represents an 80% stenosis.  Initially a 12 x 4 Lutonix drug-eluting balloon was inflated to 8 atm for 1 minute and subsequently a 14 x 6 Venovo stent is deployed across the lesion.  The stent is then postdilated with a 14 x 6 Atlas balloon inflated to 10 atm for 1 minute.  Follow-up imaging demonstrates less than 5% residual stenosis.  Is inflated to 46 atm for 1 minute. Follow-up imaging demonstrates there is less than 5% residual stenosis.  Magnified imaging at the common iliac vein confluence demonstrates that this area remains widely patent.  Previously placed stent is now completely free of thrombus.  Given the near total resolution of thrombus and treatment of the stricture I elected to terminate the case.  Popliteal sheath was pulled pressure held and a light pressure dressing applied.   Interpretation: Initial images demonstrated normal vena cava. Initial images the left lower extremity then demonstrated extensive thrombus throughout the popliteal and femoral veins as well as the external and common iliac veins. Following intervention described above there is near-total resolution of thrombus throughout the left leg venous system.  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  This is treated with antroplasty and stent placement with less than 5% residual stenosis.  COMPLICATIONS: None  CONDITION:  Stable  Hortencia Pilar  03/21/2018,2:28 PM

## 2018-03-21 NOTE — H&P (Signed)
[facility] History & Physical Update  The patient was interviewed and re-examined.  The patient's previous History and Physical has been reviewed and is unchanged.  There is no change in the plan of care. We plan to proceed with the scheduled procedure.  Hortencia Pilar, MD  03/21/2018, 1:04 PM

## 2018-03-22 DIAGNOSIS — E119 Type 2 diabetes mellitus without complications: Secondary | ICD-10-CM | POA: Diagnosis not present

## 2018-03-22 DIAGNOSIS — C20 Malignant neoplasm of rectum: Secondary | ICD-10-CM | POA: Diagnosis not present

## 2018-03-22 DIAGNOSIS — I82409 Acute embolism and thrombosis of unspecified deep veins of unspecified lower extremity: Secondary | ICD-10-CM | POA: Diagnosis not present

## 2018-03-22 DIAGNOSIS — I480 Paroxysmal atrial fibrillation: Secondary | ICD-10-CM | POA: Diagnosis not present

## 2018-03-22 LAB — CBC
HEMATOCRIT: 28.6 % — AB (ref 39.0–52.0)
HEMOGLOBIN: 9 g/dL — AB (ref 13.0–17.0)
MCH: 26.9 pg (ref 26.0–34.0)
MCHC: 31.5 g/dL (ref 30.0–36.0)
MCV: 85.6 fL (ref 80.0–100.0)
Platelets: 298 10*3/uL (ref 150–400)
RBC: 3.34 MIL/uL — ABNORMAL LOW (ref 4.22–5.81)
RDW: 14.3 % (ref 11.5–15.5)
WBC: 11.6 10*3/uL — ABNORMAL HIGH (ref 4.0–10.5)
nRBC: 0 % (ref 0.0–0.2)

## 2018-03-22 LAB — BASIC METABOLIC PANEL
Anion gap: 7 (ref 5–15)
BUN: 28 mg/dL — ABNORMAL HIGH (ref 8–23)
CHLORIDE: 104 mmol/L (ref 98–111)
CO2: 24 mmol/L (ref 22–32)
Calcium: 8.2 mg/dL — ABNORMAL LOW (ref 8.9–10.3)
Creatinine, Ser: 2.3 mg/dL — ABNORMAL HIGH (ref 0.61–1.24)
GFR calc Af Amer: 32 mL/min — ABNORMAL LOW (ref >60)
GFR calc non Af Amer: 28 mL/min — ABNORMAL LOW (ref >60)
GLUCOSE: 118 mg/dL — AB (ref 70–99)
POTASSIUM: 4.6 mmol/L (ref 3.5–5.1)
SODIUM: 135 mmol/L (ref 135–145)

## 2018-03-22 LAB — HEPARIN LEVEL (UNFRACTIONATED)
HEPARIN UNFRACTIONATED: 0.18 [IU]/mL — AB (ref 0.30–0.70)
HEPARIN UNFRACTIONATED: 1.45 [IU]/mL — AB (ref 0.30–0.70)

## 2018-03-22 LAB — GLUCOSE, CAPILLARY
GLUCOSE-CAPILLARY: 135 mg/dL — AB (ref 70–99)
GLUCOSE-CAPILLARY: 176 mg/dL — AB (ref 70–99)
GLUCOSE-CAPILLARY: 162 mg/dL — AB (ref 70–99)
Glucose-Capillary: 113 mg/dL — ABNORMAL HIGH (ref 70–99)
Glucose-Capillary: 125 mg/dL — ABNORMAL HIGH (ref 70–99)

## 2018-03-22 MED ORDER — HEPARIN BOLUS VIA INFUSION
3000.0000 [IU] | Freq: Once | INTRAVENOUS | Status: AC
  Administered 2018-03-22: 3000 [IU] via INTRAVENOUS
  Filled 2018-03-22: qty 3000

## 2018-03-22 MED ORDER — APIXABAN 5 MG PO TABS
5.0000 mg | ORAL_TABLET | Freq: Two times a day (BID) | ORAL | Status: DC
  Administered 2018-03-22 – 2018-03-23 (×3): 5 mg via ORAL
  Filled 2018-03-22 (×3): qty 1

## 2018-03-22 MED ORDER — SODIUM CHLORIDE 0.9 % IV SOLN
INTRAVENOUS | Status: DC
  Administered 2018-03-22 – 2018-03-23 (×2): via INTRAVENOUS

## 2018-03-22 MED ORDER — HEPARIN BOLUS VIA INFUSION
3000.0000 [IU] | Freq: Once | INTRAVENOUS | Status: DC
  Filled 2018-03-22: qty 3000

## 2018-03-22 NOTE — Progress Notes (Signed)
Nurse informed patient that he needed to get out of bed and walk around, patient stated " you don't know my history the last time I had this I bleed out from getting up". Nurse asked patient how soon was it the prior procedure before he had gotten up and the patients daughter stated " exactly he got up to soon the last time and no one told him that he couldn't get up". Nurse explained to patient that she would monitor him to assure he would be ok during ambulation". Patient got up with nurse tech and only wanted to walk to the bathroom and back to the bed. Will continue to encourage patient to walk.

## 2018-03-22 NOTE — Consult Note (Signed)
ANTICOAGULATION CONSULT NOTE - Initial Consult  Pharmacy Consult for heparin Indication: DVT  No Known Allergies  Patient Measurements: Height: 5' 10.5" (179.1 cm) Weight: 235 lb 14.3 oz (107 kg) IBW/kg (Calculated) : 74.15 Heparin Dosing Weight: 97 kg  Vital Signs: Temp: 98.3 F (36.8 C) (11/22 2217) Temp Source: Oral (11/22 2217) BP: 106/71 (11/22 2217) Pulse Rate: 65 (11/22 2217)  Labs: Recent Labs    03/19/18 1404 03/21/18 1733 03/21/18 2257  HGB 9.8*  --   --   HCT 31.6*  --   --   PLT 358  --   --   APTT  --  108*  --   LABPROT  --  16.0*  --   INR  --  1.29  --   HEPARINUNFRC  --   --  0.12*  CREATININE 1.78*  --   --     Estimated Creatinine Clearance: 49.7 mL/min (A) (by C-G formula based on SCr of 1.78 mg/dL (H)).   Medical History: Past Medical History:  Diagnosis Date  . A-fib ([place])   . Cancer [facility])    Colon  . CHF (congestive heart failure) ([place])   . Diabetes mellitus without complication ([place])   . Dyspnea   . Elevated lipids   . Hypertension   . PVD (peripheral vascular disease) ([facility])     Medications:  Scheduled:  . digoxin  0.25 mg Oral Daily  . docusate sodium  100 mg Oral BID  . [START ON 03/24/2018] fentaNYL  25 mcg Transdermal Q72H  . furosemide  40 mg Oral Daily  . glimepiride  4 mg Oral BID  . heparin  3,000 Units Intravenous Once  . Influenza vac split quadrivalent PF  0.5 mL Intramuscular Tomorrow-1000  . insulin aspart  0-15 Units Subcutaneous TID WC  . insulin aspart  0-5 Units Subcutaneous QHS  . insulin glargine  20 Units Subcutaneous QHS  . losartan  100 mg Oral Daily  . metoprolol tartrate  25 mg Oral BID  . pravastatin  20 mg Oral Daily  . sulfamethoxazole-trimethoprim  1 tablet Oral Daily    Assessment: 67 yo male had thrombectomy for DVT on 11/22. Patient also has history of stage IV rectal cancer and CKD. Patient was on apixaban 5 mg BID for DVT, last dose was 11/20.   Goal of Therapy:  Heparin level 0.3-0.7  units/ml Monitor platelets by anticoagulation protocol: Yes   Plan:  11/22 @ 2255 HL 0.12 subtherapeutic. Will rebolus w/ heparin 3000 units IV x 1 and increase rate to 1700 units/hr and will recheck HL @ 0900.  Tobie Lords, PharmD, BCPS Clinical Pharmacist 03/22/2018

## 2018-03-22 NOTE — Consult Note (Signed)
[place] Consultation  [name] IRS:[id number] DOB: May 22, 1950 DOA: 03/21/2018 PCP: Dion Body, MD   Requesting physician: Dr Delana Meyer Date of consultation: 03/21/18 Reason for consultation: Management of diabetes, hypertension, atrial fibrillation, CHF, CKD.  CHIEF COMPLAINT: Left lower extremity DVT  HISTORY OF PRESENT ILLNESS: Donald Townsend  is a 67 y.o. male with a known history of rectal cancer, on palliative care, diabetes type 2, atrial fibrillation, CHF, CKD, bilateral hydronephrosis with chronic nephrostomy tubes and other comorbidities. Patient is currently hospitalized for symptomatic left lower extremity DVT.  He underwent mechanical thrombectomy per vascular surgery. Internal medicine team is consulted to follow along and manage the medical problems in this patient. Is currently doing well, although complaining of left lower extremity pain and swelling, status post recent procedure. Blood test, including CBC and BMP are notable for WBC at 13.7, creatinine 1.78, hemoglobin 9.8.  Blood sugar is 161. States he is compliant with his medications at home.  PAST MEDICAL HISTORY:   Past Medical History:  Diagnosis Date  . A-fib ([place])   . Cancer [facility])    Colon  . CHF (congestive heart failure) ([place])   . Diabetes mellitus without complication ([place])   . Dyspnea   . Elevated lipids   . Hypertension   . PVD (peripheral vascular disease) ([place])     PAST SURGICAL HISTORY:  Past Surgical History:  Procedure Laterality Date  . COLON RESECTION     with colostomy  . COLONOSCOPY    . CYSTOSCOPY WITH STENT PLACEMENT Bilateral 01/18/2017   Procedure: CYSTOSCOPY WITH STENT PLACEMENT;  Surgeon: [name], MD;  Location: [facility] ORS;  Service: Urology;  Laterality: Bilateral;  . IR FLUORO GUIDE PORT INSERTION RIGHT  01/24/2017  . IR NEPHROSTOGRAM LEFT THRU EXISTING ACCESS  09/18/2017  . IR NEPHROSTOGRAM RIGHT THRU EXISTING ACCESS  09/18/2017  . IR  NEPHROSTOMY EXCHANGE LEFT  03/14/2017  . IR NEPHROSTOMY EXCHANGE LEFT  05/03/2017  . IR NEPHROSTOMY EXCHANGE LEFT  06/14/2017  . IR NEPHROSTOMY EXCHANGE LEFT  07/26/2017  . IR NEPHROSTOMY EXCHANGE LEFT  09/18/2017  . IR NEPHROSTOMY EXCHANGE LEFT  11/08/2017  . IR NEPHROSTOMY EXCHANGE LEFT  12/20/2017  . IR NEPHROSTOMY EXCHANGE LEFT  01/24/2018  . IR NEPHROSTOMY EXCHANGE LEFT  03/14/2018  . IR NEPHROSTOMY EXCHANGE RIGHT  03/14/2017  . IR NEPHROSTOMY EXCHANGE RIGHT  05/03/2017  . IR NEPHROSTOMY EXCHANGE RIGHT  06/14/2017  . IR NEPHROSTOMY EXCHANGE RIGHT  07/26/2017  . IR NEPHROSTOMY EXCHANGE RIGHT  10/04/2017  . IR NEPHROSTOMY EXCHANGE RIGHT  11/08/2017  . IR NEPHROSTOMY EXCHANGE RIGHT  12/20/2017  . IR NEPHROSTOMY EXCHANGE RIGHT  01/24/2018  . IR NEPHROSTOMY EXCHANGE RIGHT  03/14/2018  . IR NEPHROSTOMY PLACEMENT LEFT  01/19/2017  . IR NEPHROSTOMY PLACEMENT RIGHT  01/19/2017  . IVC FILTER REMOVAL N/A 08/28/2016   Procedure: IVC Filter Removal;  Surgeon: Katha Cabal, MD;  Location: [place] [facility];  Service: Cardiovascular;  Laterality: N/A;  . LOWER EXTREMITY VENOGRAPHY Left 12/18/2016   Procedure: Lower Extremity Venography;  Surgeon: Katha Cabal, MD;  Location: [place] [facility];  Service: Cardiovascular;  Laterality: Left;  . PERIPHERAL VASCULAR CATHETERIZATION Left 05/08/2016   Procedure: Lower Extremity Venography;  Surgeon: Katha Cabal, MD;  Location: [place] [facility];  Service: Cardiovascular;  Laterality: Left;    SOCIAL HISTORY:  Social History   Tobacco Use  . Smoking status: Never Smoker  . Smokeless tobacco: Never Used  Substance Use Topics  .  Alcohol use: No    FAMILY HISTORY:  Family History  Problem Relation Age of Onset  . Diabetes Mother   . Stroke Mother   . Prostate cancer Brother   . Cancer Sister        Breast cancer  . Kidney cancer Neg Hx   . Bladder Cancer Neg Hx     DRUG ALLERGIES: No Known Allergies  REVIEW OF SYSTEMS:    CONSTITUTIONAL: No fever, fatigue or weakness.  EYES: No blurred or double vision.  EARS, NOSE, AND THROAT: No tinnitus or ear pain.  RESPIRATORY: No cough, shortness of breath, wheezing or hemoptysis.  CARDIOVASCULAR: No chest pain, orthopnea, edema.  GASTROINTESTINAL: No nausea, vomiting, diarrhea or abdominal pain.  GENITOURINARY: No dysuria, hematuria.  ENDOCRINE: No polyuria, nocturia,  HEMATOLOGY: Positive for anemia of chronic diseases SKIN: No rash or lesion. MUSCULOSKELETAL: Left lower extremity diffuse swelling and tenderness.   NEUROLOGIC: No focal weakness.  PSYCHIATRY: No anxiety or depression.   MEDICATIONS AT HOME:  Prior to Admission medications   Medication Sig Start Date End Date Taking? Authorizing Provider  B-D ULTRAFINE III SHORT PEN 31G X 8 MM MISC  02/15/17  Yes [provider]  Continuous Blood Gluc Receiver (FREESTYLE LIBRE READER) DEVI Use 1 Units as directed. Check CBG's four times daily. Dx: E11.9 12/13/16  Yes [provider]  Continuous Blood Gluc Sensor (FREESTYLE LIBRE SENSOR SYSTEM) MISC Use 1 Units as directed. Check CBG's four times daily. Dx: E11.9 12/13/16  Yes [provider]  Wil[place] 250 MCG tablet Take 0.25 mg by mouth daily.  10/15/17  Yes [provider]  fentaNYL (DURAGESIC - DOSED MCG/HR) 25 MCG/HR patch Place 1 patch (25 mcg total) onto the skin every 3 (three) days. 03/19/18  Yes Cammie Sickle, MD  furosemide (LASIX) 40 MG tablet Take 40 mg by mouth daily.  04/04/17  Yes [provider]  glimepiride (AMARYL) 4 MG tablet Take 1 tablet by mouth 2 (two) times daily.  02/06/17  Yes [provider]  HYDROcodone-acetaminophen (NORCO) 10-325 MG tablet Take 1 tablet by mouth every 4 (four) hours as needed for moderate pain or severe pain. 03/10/18  Yes Borders, Kirt Boys, NP  insulin aspart (NOVOLOG FLEXPEN) 100 UNIT/ML FlexPen Inject 3-15 Units into the skin 3 (three) times daily with meals.  Sliding scale   Yes [provider]  LANTUS SOLOSTAR 100 UNIT/ML Solostar Pen Inject 20 Units into the skin at bedtime.  12/11/16  Yes [provider]  losartan (COZAAR) 100 MG tablet Take 100 mg by mouth daily.   Yes [provider]  metoprolol tartrate (LOPRESSOR) 25 MG tablet Take 25 mg by mouth 2 (two) times daily.  04/25/16  Yes [provider]  pravastatin (PRAVACHOL) 20 MG tablet Take 20 mg by mouth daily.  02/27/16  Yes [provider]  sulfamethoxazole-trimethoprim (BACTRIM DS,SEPTRA DS) 800-160 MG tablet Take 1 tablet by mouth daily.  02/28/18  Yes [provider]  ELIQUIS 5 MG TABS tablet Take 5 mg by mouth every 12 (twelve) hours. 01/24/17   [provider]      PHYSICAL EXAMINATION:   VITAL SIGNS: Blood pressure 106/71, pulse 65, temperature 98.3 F (36.8 C), temperature source Oral, resp. rate 18, height 5' 10.5" (1.791 m), weight 107 kg, SpO2 99 %.  GENERAL:  67 y.o.-year-old patient lying in the bed with moderate distress, secondary to left lower extremity pain.  EYES: Pupils equal, round, reactive to light and accommodation. No  scleral icterus. Extraocular muscles intact.  HEENT: Head atraumatic, normocephalic. Oropharynx and nasopharynx clear.  NECK:  Supple, no jugular venous distention. No thyroid enlargement, no tenderness.  LUNGS: Reduced breath sounds bilaterally, no wheezing, rales,rhonchi or crepitation. No use of accessory muscles of respiration.  CARDIOVASCULAR: S1, S2 normal. No murmurs, rubs, or gallops.  ABDOMEN: Soft, nontender, nondistended. Bowel sounds present.   EXTREMITIES: Left lower extremity is noted tender with palpation and has 3+ edema, all the way up to the thigh.  NEUROLOGIC: No focal weakness. PSYCHIATRIC: The patient is alert and oriented x 3.  SKIN: No obvious rash, lesion, or ulcer.   LABORATORY PANEL:   CBC Recent Labs  Lab 03/19/18 1404  WBC 13.7*  HGB 9.8*  HCT 31.6*  PLT  358  MCV 85.6  MCH 26.6  MCHC 31.0  RDW 13.9  LYMPHSABS 1.0  MONOABS 0.7  EOSABS 1.3*  BASOSABS 0.1   ------------------------------------------------------------------------------------------------------------------  Chemistries  Recent Labs  Lab 03/19/18 1404  NA 131*  K 4.5  CL 99  CO2 19*  GLUCOSE 237*  BUN 25*  CREATININE 1.78*  CALCIUM 8.6*  AST 23  ALT 15  ALKPHOS 108  BILITOT 0.5   ------------------------------------------------------------------------------------------------------------------ estimated creatinine clearance is 49.7 mL/min (A) (by C-G formula based on SCr of 1.78 mg/dL (H)). ------------------------------------------------------------------------------------------------------------------ No results for input(s): TSH, T4TOTAL, T3FREE, THYROIDAB in the last 72 hours.  Invalid input(s): FREET3   Coagulation profile Recent Labs  Lab 03/21/18 1733  INR 1.29   ------------------------------------------------------------------------------------------------------------------- No results for input(s): DDIMER in the last 72 hours. -------------------------------------------------------------------------------------------------------------------  Cardiac Enzymes No results for input(s): CKMB, TROPONINI, MYOGLOBIN in the last 168 hours.  Invalid input(s): CK ------------------------------------------------------------------------------------------------------------------ Invalid input(s): POCBNP  ---------------------------------------------------------------------------------------------------------------  Urinalysis    Component Value Date/Time   COLORURINE YELLOW (A) 03/14/2017 1137   APPEARANCEUR Cloudy (A) 10/30/2017 0934   LABSPEC 1.009 03/14/2017 1137   LABSPEC 1.026 12/16/2012 1419   PHURINE 5.0 03/14/2017 1137   GLUCOSEU Trace (A) 10/30/2017 0934   GLUCOSEU >=500 12/16/2012 1419   HGBUR NEGATIVE 03/14/2017 1137   BILIRUBINUR  Negative 10/30/2017 0934   BILIRUBINUR Negative 12/16/2012 1419   KETONESUR NEGATIVE 03/14/2017 1137   PROTEINUR 1+ (A) 10/30/2017 0934   PROTEINUR 30 (A) 03/14/2017 1137   NITRITE Negative 10/30/2017 0934   NITRITE NEGATIVE 03/14/2017 1137   LEUKOCYTESUR 3+ (A) 10/30/2017 0934   LEUKOCYTESUR Negative 12/16/2012 1419     RADIOLOGY: No results found.  EKG: No orders found for this or any previous visit.  IMPRESSION AND PLAN:  1.  Left lower extremity symptomatic DVT status post mechanical thrombectomy.  Continue management per vascular surgery. 2.  Stage III rectosigmoid, poorly differentiated CA, status post chemoradiation therapy with poor response.  Currently, on palliative care. 3.  Diabetes type 2, controlled.  Will monitor blood sugars before meals and at bedtime and use insulin treatment during the hospital stay.  We will hold p.o. diabetic meds during the hospital stay. 4.  Proximal atrial fibrillation, currently rate controlled, in sinus rhythm.  Continue metoprolol.  Continue Eliquis for chronic anticoagulation. 5.  CHF, currently clinically compensated.  Continue medical therapy. 6.  CKD 3.  Creatinine is at baseline.  Continue to monitor kidney function closely and avoid nephrotoxic medications. 7.  Hypertension, stable, continue home medications.   All the records are reviewed and case discussed with ED provider. Management plans discussed with the patient, family and they are in agreement.  CODE STATUS: DNR    Code Status Orders  (  From admission, onward)         Start     Ordered   03/21/18 1421  Full code  Continuous     03/21/18 1424        Code Status History    Date Active Date Inactive Code Status Order ID Comments User Context   03/14/2017 1713 03/17/2017 1626 Full Code [id number]  Fritzi Mandes, MD Inpatient   01/31/2017 1954 02/03/2017 1636 Full Code [id number]  [name], MD Inpatient   01/18/2017 1840 01/21/2017 1708 Full Code [id number]   Nicholes Mango, MD Inpatient   05/08/2016 1837 05/09/2016 1934 Full Code [id number]  Schnier, Dolores Lory, MD Inpatient    Advance Directive Documentation     Most Recent Value  Type of Advance Directive  Healthcare Power of [name], Living will [Heather [place]/Daughter/POA]  Pre-existing out of facility DNR order (yellow form or pink MOST form)  -  "MOST" Form in Place?  -       TOTAL TIME TAKING CARE OF THIS PATIENT: 50 minutes.    Amelia Jo M.D on 03/22/2018 at 2:36 AM  Between 7am to 6pm - Pager - [phone]  After 6pm go to [email or web address] - password [email or web address] [facility] Office  [phone]  CC: Primary care physician; Dion Body, MD

## 2018-03-22 NOTE — Care Management Obs Status (Signed)
[place] NOTIFICATION   Patient Details  Name: Donald Townsend MRN: [id number] Date of Birth: 1951/01/18   Medicare Observation Status Notification Given:  Yes    Rahshawn Remo A Dinia Joynt, RN 03/22/2018, 9:47 AM

## 2018-03-22 NOTE — Progress Notes (Signed)
[facility] NAME: Donald Townsend    MR#:  [id number]  DATE OF BIRTH:  [birth year]  SUBJECTIVE:  CHIEF COMPLAINT: Patient is resting comfortably.  Pain is manageable.  Daughter at bedside.  REVIEW OF SYSTEMS:  CONSTITUTIONAL: No fever, fatigue or weakness.  EYES: No blurred or double vision.  EARS, NOSE, AND THROAT: No tinnitus or ear pain.  RESPIRATORY: No cough, shortness of breath, wheezing or hemoptysis.  CARDIOVASCULAR: No chest pain, orthopnea, edema.  GASTROINTESTINAL: No nausea, vomiting, diarrhea or abdominal pain.  GENITOURINARY: No dysuria, hematuria.  ENDOCRINE: No polyuria, nocturia,  HEMATOLOGY: No anemia, easy bruising or bleeding SKIN: No rash or lesion. MUSCULOSKELETAL: No joint pain or arthritis.   NEUROLOGIC: No tingling, numbness, weakness.  PSYCHIATRY: No anxiety or depression.   DRUG ALLERGIES:  No Known Allergies  VITALS:  Blood pressure 122/64, pulse 80, temperature 99.1 F (37.3 C), temperature source Oral, resp. rate 18, height 5' 10.5" (1.791 m), weight 107 kg, SpO2 99 %.  PHYSICAL EXAMINATION:  GENERAL:  67 y.o.-year-old patient lying in the bed with no acute distress.  EYES: Pupils equal, round, reactive to light and accommodation. No scleral icterus. Extraocular muscles intact.  HEENT: Head atraumatic, normocephalic. Oropharynx and nasopharynx clear.  NECK:  Supple, no jugular venous distention. No thyroid enlargement, no tenderness.  LUNGS: Normal breath sounds bilaterally, no wheezing, rales,rhonchi or crepitation. No use of accessory muscles of respiration.  CARDIOVASCULAR: S1, S2 normal. No murmurs, rubs, or gallops.  ABDOMEN: Soft, nontender, nondistended. Bowel sounds present.  EXTREMITIES: Left lower extremity in the creep bandage.  2+ pedal edema,  no cyanosis, or clubbing.  NEUROLOGIC: Awake, alert and oriented x3. Sensation intact. Gait not checked.  PSYCHIATRIC: The patient is  alert and oriented x 3.  SKIN: No obvious rash, lesion, or ulcer.    LABORATORY PANEL:   CBC Recent Labs  Lab 03/22/18 0628  WBC 11.6*  HGB 9.0*  HCT 28.6*  PLT 298   ------------------------------------------------------------------------------------------------------------------  Chemistries  Recent Labs  Lab 03/19/18 1404 03/22/18 0628  NA 131* 135  K 4.5 4.6  CL 99 104  CO2 19* 24  GLUCOSE 237* 118*  BUN 25* 28*  CREATININE 1.78* 2.30*  CALCIUM 8.6* 8.2*  AST 23  --   ALT 15  --   ALKPHOS 108  --   BILITOT 0.5  --    ------------------------------------------------------------------------------------------------------------------  Cardiac Enzymes No results for input(s): TROPONINI in the last 168 hours. ------------------------------------------------------------------------------------------------------------------  RADIOLOGY:  No results found.  EKG:  No orders found for this or any previous visit.  ASSESSMENT AND PLAN:     1.  Left lower extremity symptomatic DVT status post mechanical thrombectomy.  Continue management per vascular surgery.  Continue heparin drip and plan is to transition to oral anticoagulants Pain management as needed Encourage out of bed 2.  Stage III rectosigmoid, poorly differentiated CA, status post chemoradiation therapy with poor response.  Currently, on palliative care. 3.  Diabetes type 2, controlled.  Will monitor blood sugars before meals and at bedtime and use insulin treatment during the hospital stay.  We will hold p.o. diabetic meds during the hospital stay. 4.  Proximal atrial fibrillation, currently rate controlled, in sinus rhythm.  Continue metoprolol.    Will resume Eliquis for chronic anticoagulation after discontinuing heparin drip 5.  CHF, currently clinically compensated.  Continue medical therapy. 6.   AKI on CKD 3 with chronic urostomy tubes Creatinine 1.78-2.30.  Gentle hydration with IV fluids Hold  Lasix Continue dressing changes every 2 to 3 days  Creatinine is at baseline.  Continue to monitor kidney function closely and avoid nephrotoxic medications. Outpatient follow-up with urology as recommended 7.  Hypertension, stable, continue home medications.   All the records are reviewed and case discussed with Care Management/Social Workerr. Management plans discussed with the patient, family and they are in agreement.  CODE STATUS: fc   TOTAL TIME TAKING CARE OF THIS PATIENT: 36  minutes.   POSSIBLE D/C IN 1  DAYS, DEPENDING ON CLINICAL CONDITION.  Note: This dictation was prepared with Dragon dictation along with smaller phrase technology. Any transcriptional errors that result from this process are unintentional.   Nicholes Mango M.D on 03/22/2018 at 1:53 PM  Between 7am to 6pm - Pager - [phone] After 6pm go to [email or web address] - password [email or web address] [facility]  Office  [phone]  CC: Primary care physician; Dion Body, MD

## 2018-03-22 NOTE — Care Management Note (Signed)
Case Management Note  Patient Details  Name: Donald Townsend MRN: [id number] Date of Birth: Sep 03, 1950  Subjective/Objective:   Patient admitted to [facility] under observation status for DVT. RNCM consulted on patient to provide MOON letter and complete assessment. Patient is post op from a thrombectomy. Currently on Eliquis but reports difficult in affordability, I hgave provided a coupon. Patient lives alone but his daughter checks on him often. 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 Patient recently decided to cease his chemo treatments and met with Billey Chang, NP from the palliative dept. Per the patient he has a follow up with him to determine the "next steps". He states no need for assistance at this time but may be open to it in the future depending on how his condition is. He is aware that he can seek home health/palliative care in the home and may eventually do so. PCP is [facility]. No issues obtaining medications other than eliquis.                   Action/Plan: RNCM to continue to follow for any needs.   Expected Discharge Date:                  Expected Discharge Plan:     In-House Referral:     Discharge planning Services     Post Acute Care Choice:    Choice offered to:     DME Arranged:    DME Agency:     HH Arranged:    HH Agency:     Status of Service:     If discussed at [employer], dates discussed:    Additional Comments:  Latanya Maudlin, RN 03/22/2018, 9:49 AM

## 2018-03-22 NOTE — Consult Note (Signed)
ANTICOAGULATION CONSULT NOTE - Initial Consult  Pharmacy Consult for heparin Indication: DVT  No Known Allergies  Patient Measurements: Height: 5' 10.5" (179.1 cm) Weight: 235 lb 14.3 oz (107 kg) IBW/kg (Calculated) : 74.15 Heparin Dosing Weight: 97 kg  Vital Signs: Temp: 98.2 F (36.8 C) (11/23 0306) Temp Source: Oral (11/23 0306) BP: 103/60 (11/23 0953) Pulse Rate: 68 (11/23 0953)  Labs: Recent Labs    03/19/18 1404 03/21/18 1733 03/21/18 2257 03/22/18 0628 03/22/18 0945  HGB 9.8*  --   --  9.0*  --   HCT 31.6*  --   --  28.6*  --   PLT 358  --   --  298  --   APTT  --  108*  --   --   --   LABPROT  --  16.0*  --   --   --   INR  --  1.29  --   --   --   HEPARINUNFRC  --   --  0.12*  --  0.18*  CREATININE 1.78*  --   --  2.30*  --     Estimated Creatinine Clearance: 38.5 mL/min (A) (by C-G formula based on SCr of 2.3 mg/dL (H)).   Medical History: Past Medical History:  Diagnosis Date  . A-fib ([place])   . Cancer [facility])    Colon  . CHF (congestive heart failure) ([place])   . Diabetes mellitus without complication ([place])   . Dyspnea   . Elevated lipids   . Hypertension   . PVD (peripheral vascular disease) ([facility])     Medications:  Scheduled:  . digoxin  0.25 mg Oral Daily  . docusate sodium  100 mg Oral BID  . [START ON 03/24/2018] fentaNYL  25 mcg Transdermal Q72H  . furosemide  40 mg Oral Daily  . Influenza vac split quadrivalent PF  0.5 mL Intramuscular Tomorrow-1000  . insulin aspart  0-15 Units Subcutaneous TID WC  . insulin aspart  0-5 Units Subcutaneous QHS  . insulin glargine  20 Units Subcutaneous QHS  . losartan  100 mg Oral Daily  . metoprolol tartrate  25 mg Oral BID  . pravastatin  20 mg Oral Daily  . sulfamethoxazole-trimethoprim  1 tablet Oral Daily    Assessment: 67 yo male had thrombectomy for DVT on 11/22. Patient also has history of stage IV rectal cancer and CKD. Patient was on apixaban 5 mg BID for DVT, last dose was 11/20. Hgb 9.0,  baseline heparin level elevated but drawn after heparin initiation.  Heparin Course 11/22 initiation 4800 unit bolus, then 1550 units/hr 11/22 2255 HL 0.12: bolus 3000 units, then increase to 1700 units/hr 11/23 0945 HL 0.18  Goal of Therapy:  Heparin level 0.3-0.7 units/ml Monitor platelets by anticoagulation protocol: Yes   Plan:  Re-bolus heparin 3000 units, followed by continuous infusion of 2000 units/hr. Will order heparin level for 1800.   Pharmacy will continue to monitor.   Dallie Piles, PharmD Clinical Pharmacist 03/22/2018 10:27 AM

## 2018-03-22 NOTE — Progress Notes (Signed)
Subjective: Interval History: has complaints some ongoing pain.[name] However this is improved compared to prior to the procedure. He feels that the leg is also softer than prior to the procedure. He has not gotten up yet however. He would like to rest today.   Objective: Vital signs in last 24 hours: Temp:  [98.1 F (36.7 C)-98.3 F (36.8 C)] 98.2 F (36.8 C) (11/23 0306) Pulse Rate:  [65-106] 68 (11/23 0953) Resp:  [12-21] 18 (11/23 0306) BP: (103-121)/(50-81) 103/60 (11/23 0953) SpO2:  [97 %-100 %] 98 % (11/23 0306) Weight:  [107 kg] 107 kg (11/22 1137)  Intake/Output from previous day: 11/22 0701 - 11/23 0700 In: -  Out: 850 [Urine:850] Intake/Output this shift: Total I/O In: 240 [P.O.:240] Out: 250 [Urine:250]  General appearance: alert, cooperative and appears stated age Head: Normocephalic, without obvious abnormality, atraumatic GI: soft, non-tender; bowel sounds normal; no masses,  no organomegaly Extremities: edema 2-3 +, no cyanosis Skin: Skin color, texture, turgor normal. No rashes or lesions or soft and pliable Neurologic: Grossly normal  Lab Results: Recent Labs    03/19/18 1404 03/22/18 0628  WBC 13.7* 11.6*  HGB 9.8* 9.0*  HCT 31.6* 28.6*  PLT 358 298   BMET Recent Labs    03/19/18 1404 03/22/18 0628  NA 131* 135  K 4.5 4.6  CL 99 104  CO2 19* 24  GLUCOSE 237* 118*  BUN 25* 28*  CREATININE 1.78* 2.30*  CALCIUM 8.6* 8.2*    Studies/Results: Ir Nephrostomy Exchange Left  Result Date: 03/14/2018 INDICATION: History of rectal cancer with chronic bilateral percutaneous nephrostomy catheters. Patient presents today for routine fluoroscopic guided nephrostomy catheter exchange. EXAM: FLUOROSCOPIC GUIDED BILATERAL SIDED NEPHROSTOMY CATHETER EXCHANGE COMPARISON:  None. CONTRAST:  A total of 20 mL Isovue-300 administered was administered into both collecting systems FLUOROSCOPY TIME:  36 seconds (40 mGy) COMPLICATIONS: None immediate. TECHNIQUE: Informed  written consent was obtained from the patient after a discussion of the risks, benefits and alternatives to treatment. Questions regarding the procedure were encouraged and answered. A timeout was performed prior to the initiation of the procedure. The bilateral flanks and external portions of existing nephrostomy catheters were prepped and draped in the usual sterile fashion. A sterile drape was applied covering the operative field. Maximum barrier sterile technique with sterile gowns and gloves were used for the procedure. A timeout was performed prior to the initiation of the procedure. A pre procedural spot fluoroscopic image was obtained. Beginning with the left-sided nephrostomy, a small amount of contrast was injected via the existing left-sided nephrostomy catheter demonstrating appropriate positioning within the renal pelvis. The existing nephrostomy catheter was cut and cannulated with a Benson wire which was coiled within the renal pelvis. Under intermittent fluoroscopic guidance, the existing nephrostomy catheter was exchanged for a new 10.2 [place] all-purpose drainage catheter. Limited contrast injection confirmed appropriate positioning within the left renal pelvis and a post exchange fluoroscopic image was obtained. The catheter was locked and reconnected to a gravity bag. The identical repeat procedure was repeated for the contralateral right-sided nephrostomy, ultimately allowing successful exchange of a new 10.2 [place] all-purpose drainage catheter with end coiled and locked within the right renal pelvis. Dressings were placed. The patient tolerated the above procedures well without immediate postprocedural complication. FINDINGS: The existing nephrostomy catheters are appropriately positioned and functioning. After successful fluoroscopic guided exchange, new bilateral 10.2 French nephrostomy catheters are coiled and locked within the respective renal pelvises. IMPRESSION: Successful fluoroscopic  guided exchange of bilateral 10.2 [place] percutaneous  nephrostomy catheters. Electronically Signed   By: Sandi Mariscal M.D.   On: 03/14/2018 12:42   Ir Nephrostomy Exchange Right  Result Date: 03/14/2018 INDICATION: History of rectal cancer with chronic bilateral percutaneous nephrostomy catheters. Patient presents today for routine fluoroscopic guided nephrostomy catheter exchange. EXAM: FLUOROSCOPIC GUIDED BILATERAL SIDED NEPHROSTOMY CATHETER EXCHANGE COMPARISON:  None. CONTRAST:  A total of 20 mL Isovue-300 administered was administered into both collecting systems FLUOROSCOPY TIME:  36 seconds (40 mGy) COMPLICATIONS: None immediate. TECHNIQUE: Informed written consent was obtained from the patient after a discussion of the risks, benefits and alternatives to treatment. Questions regarding the procedure were encouraged and answered. A timeout was performed prior to the initiation of the procedure. The bilateral flanks and external portions of existing nephrostomy catheters were prepped and draped in the usual sterile fashion. A sterile drape was applied covering the operative field. Maximum barrier sterile technique with sterile gowns and gloves were used for the procedure. A timeout was performed prior to the initiation of the procedure. A pre procedural spot fluoroscopic image was obtained. Beginning with the left-sided nephrostomy, a small amount of contrast was injected via the existing left-sided nephrostomy catheter demonstrating appropriate positioning within the renal pelvis. The existing nephrostomy catheter was cut and cannulated with a Benson wire which was coiled within the renal pelvis. Under intermittent fluoroscopic guidance, the existing nephrostomy catheter was exchanged for a new 10.2 [place] all-purpose drainage catheter. Limited contrast injection confirmed appropriate positioning within the left renal pelvis and a post exchange fluoroscopic image was obtained. The catheter was locked and  reconnected to a gravity bag. The identical repeat procedure was repeated for the contralateral right-sided nephrostomy, ultimately allowing successful exchange of a new 10.2 [place] all-purpose drainage catheter with end coiled and locked within the right renal pelvis. Dressings were placed. The patient tolerated the above procedures well without immediate postprocedural complication. FINDINGS: The existing nephrostomy catheters are appropriately positioned and functioning. After successful fluoroscopic guided exchange, new bilateral 10.2 French nephrostomy catheters are coiled and locked within the respective renal pelvises. IMPRESSION: Successful fluoroscopic guided exchange of bilateral 10.2 French percutaneous nephrostomy catheters. Electronically Signed   By: Sandi Mariscal M.D.   On: 03/14/2018 12:42   Anti-infectives: Anti-infectives (From admission, onward)   Start     Dose/Rate Route Frequency Ordered Stop   03/22/18 1000  sulfamethoxazole-trimethoprim (BACTRIM DS,SEPTRA DS) 800-160 MG per tablet 1 tablet     1 tablet Oral Daily 03/21/18 1424     03/21/18 1136  ceFAZolin (ANCEF) 2-4 GM/100ML-% IVPB    Note to Pharmacy:  Despina Arias  : cabinet override      03/21/18 1136 03/21/18 2344   03/21/18 0045  ceFAZolin (ANCEF) 2 g in dextrose 5 % 50 mL IVPB     2 g 100 mL/hr over 30 Minutes Intravenous  Once 03/21/18 0037 03/21/18 1308      Assessment/Plan: s/p Procedure(s): LOWER EXTREMITY VENOGRAPHY (Left) Continue foley due to chronic urostomy tubes Patient will be mobilized today and be transitioned to oral anticoagulants. He continues to have some pain issues so we will keep in hospital another day and take down dressing tomorrow to assess the status of his edema.   LOS: 0 days   Juanna Cao 03/22/2018, 11:07 AM

## 2018-03-23 DIAGNOSIS — Z823 Family history of stroke: Secondary | ICD-10-CM | POA: Diagnosis not present

## 2018-03-23 DIAGNOSIS — Z933 Colostomy status: Secondary | ICD-10-CM | POA: Diagnosis not present

## 2018-03-23 DIAGNOSIS — E1122 Type 2 diabetes mellitus with diabetic chronic kidney disease: Secondary | ICD-10-CM | POA: Diagnosis present

## 2018-03-23 DIAGNOSIS — C20 Malignant neoplasm of rectum: Secondary | ICD-10-CM | POA: Diagnosis present

## 2018-03-23 DIAGNOSIS — I82402 Acute embolism and thrombosis of unspecified deep veins of left lower extremity: Secondary | ICD-10-CM | POA: Diagnosis not present

## 2018-03-23 DIAGNOSIS — I82220 Acute embolism and thrombosis of inferior vena cava: Secondary | ICD-10-CM | POA: Diagnosis present

## 2018-03-23 DIAGNOSIS — I4891 Unspecified atrial fibrillation: Secondary | ICD-10-CM | POA: Diagnosis present

## 2018-03-23 DIAGNOSIS — I824Z1 Acute embolism and thrombosis of unspecified deep veins of right distal lower extremity: Secondary | ICD-10-CM

## 2018-03-23 DIAGNOSIS — R0902 Hypoxemia: Secondary | ICD-10-CM | POA: Diagnosis present

## 2018-03-23 DIAGNOSIS — Z66 Do not resuscitate: Secondary | ICD-10-CM | POA: Diagnosis present

## 2018-03-23 DIAGNOSIS — I82412 Acute embolism and thrombosis of left femoral vein: Secondary | ICD-10-CM | POA: Diagnosis present

## 2018-03-23 DIAGNOSIS — Z8042 Family history of malignant neoplasm of prostate: Secondary | ICD-10-CM | POA: Diagnosis not present

## 2018-03-23 DIAGNOSIS — Z515 Encounter for palliative care: Secondary | ICD-10-CM | POA: Diagnosis present

## 2018-03-23 DIAGNOSIS — Z833 Family history of diabetes mellitus: Secondary | ICD-10-CM | POA: Diagnosis not present

## 2018-03-23 DIAGNOSIS — I509 Heart failure, unspecified: Secondary | ICD-10-CM | POA: Diagnosis present

## 2018-03-23 DIAGNOSIS — Z923 Personal history of irradiation: Secondary | ICD-10-CM | POA: Diagnosis not present

## 2018-03-23 DIAGNOSIS — E1151 Type 2 diabetes mellitus with diabetic peripheral angiopathy without gangrene: Secondary | ICD-10-CM | POA: Diagnosis present

## 2018-03-23 DIAGNOSIS — N179 Acute kidney failure, unspecified: Secondary | ICD-10-CM | POA: Diagnosis present

## 2018-03-23 DIAGNOSIS — Z7901 Long term (current) use of anticoagulants: Secondary | ICD-10-CM | POA: Diagnosis not present

## 2018-03-23 DIAGNOSIS — N183 Chronic kidney disease, stage 3 (moderate): Secondary | ICD-10-CM | POA: Diagnosis present

## 2018-03-23 DIAGNOSIS — Z803 Family history of malignant neoplasm of breast: Secondary | ICD-10-CM | POA: Diagnosis not present

## 2018-03-23 DIAGNOSIS — Z23 Encounter for immunization: Secondary | ICD-10-CM | POA: Diagnosis not present

## 2018-03-23 DIAGNOSIS — I82422 Acute embolism and thrombosis of left iliac vein: Secondary | ICD-10-CM | POA: Diagnosis present

## 2018-03-23 DIAGNOSIS — I130 Hypertensive heart and chronic kidney disease with heart failure and stage 1 through stage 4 chronic kidney disease, or unspecified chronic kidney disease: Secondary | ICD-10-CM | POA: Diagnosis present

## 2018-03-23 DIAGNOSIS — Z9221 Personal history of antineoplastic chemotherapy: Secondary | ICD-10-CM | POA: Diagnosis not present

## 2018-03-23 LAB — BASIC METABOLIC PANEL
ANION GAP: 5 (ref 5–15)
BUN: 25 mg/dL — AB (ref 8–23)
CO2: 26 mmol/L (ref 22–32)
Calcium: 8.6 mg/dL — ABNORMAL LOW (ref 8.9–10.3)
Chloride: 106 mmol/L (ref 98–111)
Creatinine, Ser: 1.91 mg/dL — ABNORMAL HIGH (ref 0.61–1.24)
GFR, EST AFRICAN AMERICAN: 40 mL/min — AB (ref >60)
GFR, EST NON AFRICAN AMERICAN: 35 mL/min — AB (ref >60)
Glucose, Bld: 115 mg/dL — ABNORMAL HIGH (ref 70–99)
POTASSIUM: 5 mmol/L (ref 3.5–5.1)
SODIUM: 137 mmol/L (ref 135–145)

## 2018-03-23 LAB — GLUCOSE, CAPILLARY
GLUCOSE-CAPILLARY: 106 mg/dL — AB (ref 70–99)
Glucose-Capillary: 149 mg/dL — ABNORMAL HIGH (ref 70–99)

## 2018-03-23 MED ORDER — ONDANSETRON HCL 4 MG PO TABS: 4.0000 mg | ORAL_TABLET | Freq: Four times a day (QID) | ORAL | 0 refills | Status: AC | PRN

## 2018-03-23 MED ORDER — MAGNESIUM HYDROXIDE 400 MG/5ML PO SUSP: 30.0000 mL | Freq: Every day | ORAL | 0 refills | Status: AC | PRN

## 2018-03-23 NOTE — Progress Notes (Signed)
Discharge teaching given to patient, patient verbalized understanding and had no questions. Patient IV removed. Patient will be transported home by family. All patient belongings gathered prior to leaving.  

## 2018-03-23 NOTE — Discharge Summary (Signed)
Physician Discharge Summary  Patient ID: Donald Townsend MRN: [id number] DOB/AGE: 1950/05/05 67 y.o.  Admit date: 03/21/2018 Discharge date: 03/23/2018  Admission Diagnoses:  Discharge Diagnoses:  Active Problems:   Left leg DVT [facility])   Discharged Condition: good  Hospital Course: Patient underwent percutaneous venous thrombectomy for DVT and a second stent was placed. He has had a good result after a couple of days of bedrest with leg elevation. He has gotten out of bed to the bathroom and into the hall. The compression wrap was taken down and the thigh and leg look good. He will transition to a compression stocking when he gets up.  Consults: internal medicine and pharmacy  Significant Diagnostic Studies: angiography: venous thrombectomy and stent placement  Treatments: anticoagulation: heparin and oral anticoagulants  Discharge Exam: Blood pressure (!) 109/51, pulse 76, temperature 98.9 F (37.2 C), temperature source Oral, resp. rate 18, height 5' 10.5" (1.791 m), weight 107 kg, SpO2 99 %. General appearance: alert, cooperative and appears stated age Head: Normocephalic, without obvious abnormality, atraumatic Extremities: edema reduced; soft thigh and calf Pulses: 1+ left DP  Disposition:    Allergies as of 03/23/2018   No Known Allergies     Medication List    TAKE these medications   B-D ULTRAFINE III SHORT PEN 31G X 8 MM Misc Generic drug:  Insulin Pen Needle   DIGOX 0.25 MG tablet Generic drug:  digoxin Take 0.25 mg by mouth daily.   ELIQUIS 5 MG Tabs tablet Generic drug:  apixaban Take 5 mg by mouth every 12 (twelve) hours.   fentaNYL 25 MCG/HR patch Commonly known as:  DURAGESIC - dosed mcg/hr Place 1 patch (25 mcg total) onto the skin every 3 (three) days.   FREESTYLE LIBRE READER Devi Use 1 Units as directed. Check CBG's four times daily. Dx: E11.9   FREESTYLE LIBRE SENSOR SYSTEM Misc Use 1 Units as directed. Check CBG's four times daily.  Dx: E11.9   furosemide 40 MG tablet Commonly known as:  LASIX Take 40 mg by mouth daily.   glimepiride 4 MG tablet Commonly known as:  AMARYL Take 1 tablet by mouth 2 (two) times daily.   HYDROcodone-acetaminophen 10-325 MG tablet Commonly known as:  NORCO Take 1 tablet by mouth every 4 (four) hours as needed for moderate pain or severe pain.   LANTUS SOLOSTAR 100 UNIT/ML Solostar Pen Generic drug:  Insulin Glargine Inject 20 Units into the skin at bedtime.   losartan 100 MG tablet Commonly known as:  COZAAR Take 100 mg by mouth daily.   magnesium hydroxide 400 MG/5ML suspension Commonly known as:  MILK OF MAGNESIA Take 30 mLs by mouth daily as needed for mild constipation.   metoprolol tartrate 25 MG tablet Commonly known as:  LOPRESSOR Take 25 mg by mouth 2 (two) times daily.   NOVOLOG FLEXPEN 100 UNIT/ML FlexPen Generic drug:  insulin aspart Inject 3-15 Units into the skin 3 (three) times daily with meals. Sliding scale   ondansetron 4 MG tablet Commonly known as:  ZOFRAN Take 1 tablet (4 mg total) by mouth every 6 (six) hours as needed for nausea.   pravastatin 20 MG tablet Commonly known as:  PRAVACHOL Take 20 mg by mouth daily.   sulfamethoxazole-trimethoprim 800-160 MG tablet Commonly known as:  BACTRIM DS,SEPTRA DS Take 1 tablet by mouth daily.      Follow-up Information    Schnier, Dolores Lory, MD Follow up in 2 week(s).   Specialties:  Vascular Surgery, Cardiology, Radiology,  Vascular Surgery Why:  no studies  Contact information: [place] [address] [phone]           Signed: Juanna Cao 03/23/2018, 9:49 AM

## 2018-03-23 NOTE — Progress Notes (Signed)
[place] [facility] [place] NAME: Donald Townsend    MR#:  [id number]  DATE OF BIRTH:  [birth year]  SUBJECTIVE:  CHIEF COMPLAINT: Patient is resting comfortably.   Pain is manageable.  Swelling in the leg is improving.  Heparin drip discontinued and patient is resumed on Eliquis and okay to discharge from vascular and medical standpoint, daughter at bedside.  REVIEW OF SYSTEMS:  CONSTITUTIONAL: No fever, fatigue or weakness.  EYES: No blurred or double vision.  EARS, NOSE, AND THROAT: No tinnitus or ear pain.  RESPIRATORY: No cough, shortness of breath, wheezing or hemoptysis.  CARDIOVASCULAR: No chest pain, orthopnea, edema.  GASTROINTESTINAL: No nausea, vomiting, diarrhea or abdominal pain.  GENITOURINARY: No dysuria, hematuria.  ENDOCRINE: No polyuria, nocturia,  HEMATOLOGY: No anemia, easy bruising or bleeding SKIN: No rash or lesion. MUSCULOSKELETAL: No joint pain or arthritis.   NEUROLOGIC: No tingling, numbness, weakness.  PSYCHIATRY: No anxiety or depression.   DRUG ALLERGIES:  No Known Allergies  VITALS:  Blood pressure 111/66, pulse 77, temperature 98.8 F (37.1 C), temperature source Oral, resp. rate 14, height 5' 10.5" (1.791 m), weight 107 kg, SpO2 91 %.  PHYSICAL EXAMINATION:  GENERAL:  67 y.o.-year-old patient lying in the bed with no acute distress.  EYES: Pupils equal, round, reactive to light and accommodation. No scleral icterus. Extraocular muscles intact.  HEENT: Head atraumatic, normocephalic. Oropharynx and nasopharynx clear.  NECK:  Supple, no jugular venous distention. No thyroid enlargement, no tenderness.  LUNGS: Normal breath sounds bilaterally, no wheezing, rales,rhonchi or crepitation. No use of accessory muscles of respiration.  CARDIOVASCULAR: S1, S2 normal. No murmurs, rubs, or gallops.  ABDOMEN: Soft, nontender, nondistended. Bowel sounds present.  EXTREMITIES: Left lower extremity  1+ pedal edema,  no  cyanosis, or clubbing.  NEUROLOGIC: Awake, alert and oriented x3. Sensation intact. Gait not checked.  PSYCHIATRIC: The patient is alert and oriented x 3.  SKIN: No obvious rash, lesion, or ulcer.    LABORATORY PANEL:   CBC Recent Labs  Lab 03/22/18 0628  WBC 11.6*  HGB 9.0*  HCT 28.6*  PLT 298   ------------------------------------------------------------------------------------------------------------------  Chemistries  Recent Labs  Lab 03/19/18 1404  03/23/18 0621  NA 131*   < > 137  K 4.5   < > 5.0  CL 99   < > 106  CO2 19*   < > 26  GLUCOSE 237*   < > 115*  BUN 25*   < > 25*  CREATININE 1.78*   < > 1.91*  CALCIUM 8.6*   < > 8.6*  AST 23  --   --   ALT 15  --   --   ALKPHOS 108  --   --   BILITOT 0.5  --   --    < > = values in this interval not displayed.   ------------------------------------------------------------------------------------------------------------------  Cardiac Enzymes No results for input(s): TROPONINI in the last 168 hours. ------------------------------------------------------------------------------------------------------------------  RADIOLOGY:  No results found.  EKG:  No orders found for this or any previous visit.  ASSESSMENT AND PLAN:     1.  Left lower extremity symptomatic DVT status post mechanical thrombectomy.  Continue management per vascular surgery.  Heparin drip discontinued and patient is transected back to Eliquis which is his chronic home medication  Pain management as needed Vascular surgery discharging patient home today 2.  Stage III rectosigmoid, poorly differentiated CA, status post chemoradiation therapy with poor response.  Currently, on palliative care.  3.  Diabetes type 2, controlled.  Will monitor blood sugars before meals and at bedtime and use insulin treatment during the hospital stay.  We will hold p.o. diabetic meds during the hospital stay. 4.  Proximal atrial fibrillation, currently rate  controlled, in sinus rhythm.  Continue metoprolol.    Will resume Eliquis for chronic anticoagulation after discontinuing heparin drip 5.  CHF, currently clinically compensated.  Continue medical therapy. 6.   AKI on CKD 3 with chronic urostomy tubes Creatinine 1.78-2.30--1.91 at his baseline.  Status post gentle hydration with IV fluids  Resume Lasix Continue dressing changes every 2 to 3 days  Creatinine is at baseline.   Continue to monitor kidney function closely and avoid nephrotoxic medications. Outpatient follow-up with urology as recommended 7.  Hypertension, stable, continue home medications.   All the records are reviewed and case discussed with Care Management/Social Workerr. Management plans discussed with the patient, daughter at bedside and they are in agreement.  CODE STATUS: fc   TOTAL TIME TAKING CARE OF THIS PATIENT: 36  minutes.   Patient is getting discharged home today.  Note: This dictation was prepared with Dragon dictation along with smaller phrase technology. Any transcriptional errors that result from this process are unintentional.   Nicholes Mango M.D on 03/23/2018 at 11:59 AM  Between 7am to 6pm - Pager - [phone] After 6pm go to [email or web address] - password [email or web address] [facility]  Office  [phone]  CC: Primary care physician; Dion Body, MD

## 2018-03-24 ENCOUNTER — Encounter: Payer: Self-pay | Admitting: Vascular Surgery

## 2018-03-26 ENCOUNTER — Telehealth: Payer: Self-pay | Admitting: *Deleted

## 2018-03-26 ENCOUNTER — Encounter: Payer: Self-pay | Admitting: Internal Medicine

## 2018-03-26 ENCOUNTER — Other Ambulatory Visit: Payer: Self-pay | Admitting: Nurse Practitioner

## 2018-03-26 ENCOUNTER — Telehealth (INDEPENDENT_AMBULATORY_CARE_PROVIDER_SITE_OTHER): Payer: Self-pay

## 2018-03-26 NOTE — Telephone Encounter (Signed)
Patient called about pain medication because he had a venogram on 03/21/18. After looking into the patient's chart , the patient called Dr. Juliann Pulse office this morning as well for Hydrocodone and Fentanyl patches. Per the message from Dr. Aletha Halim office this will be given to the patient. I called the patient and asked if he had spoken with anyone at Dr. Aletha Halim office or looked at his Mychart and he stated he had not. I encouraged him to either call the office or look at his Mychart regarding the pain medication.

## 2018-03-26 NOTE — Telephone Encounter (Signed)
Returned phone call to [place]. - [phone]. Pharmacy just wanted to make our team aware that patient is requesting RF of hydrocodone. I did mention that the daughter also sent a msg to request for this and MD was already aware.

## 2018-03-26 NOTE — Telephone Encounter (Signed)
-----   Message from Shawnee Knapp, RN sent at 03/26/2018  9:57 AM EST ----- Regarding: Prescriptions Pharmacy contacted patient about refills.  There is an issue with how they are prescribed?  Can you contact pharmacy?

## 2018-04-02 ENCOUNTER — Inpatient Hospital Stay: Payer: Medicare Other | Attending: Hospice and Palliative Medicine | Admitting: Hospice and Palliative Medicine

## 2018-04-02 ENCOUNTER — Encounter: Payer: Self-pay | Admitting: Hospice and Palliative Medicine

## 2018-04-02 VITALS — BP 93/60 | HR 88 | Temp 97.8°F | Resp 18

## 2018-04-02 DIAGNOSIS — R600 Localized edema: Secondary | ICD-10-CM | POA: Diagnosis not present

## 2018-04-02 DIAGNOSIS — N184 Chronic kidney disease, stage 4 (severe): Secondary | ICD-10-CM | POA: Diagnosis not present

## 2018-04-02 DIAGNOSIS — R531 Weakness: Secondary | ICD-10-CM | POA: Diagnosis not present

## 2018-04-02 DIAGNOSIS — I130 Hypertensive heart and chronic kidney disease with heart failure and stage 1 through stage 4 chronic kidney disease, or unspecified chronic kidney disease: Secondary | ICD-10-CM | POA: Insufficient documentation

## 2018-04-02 DIAGNOSIS — Z66 Do not resuscitate: Secondary | ICD-10-CM

## 2018-04-02 DIAGNOSIS — Z79899 Other long term (current) drug therapy: Secondary | ICD-10-CM | POA: Diagnosis not present

## 2018-04-02 DIAGNOSIS — Z7984 Long term (current) use of oral hypoglycemic drugs: Secondary | ICD-10-CM | POA: Insufficient documentation

## 2018-04-02 DIAGNOSIS — C20 Malignant neoplasm of rectum: Secondary | ICD-10-CM | POA: Insufficient documentation

## 2018-04-02 DIAGNOSIS — Z7901 Long term (current) use of anticoagulants: Secondary | ICD-10-CM | POA: Insufficient documentation

## 2018-04-02 DIAGNOSIS — E1122 Type 2 diabetes mellitus with diabetic chronic kidney disease: Secondary | ICD-10-CM | POA: Insufficient documentation

## 2018-04-02 DIAGNOSIS — I4891 Unspecified atrial fibrillation: Secondary | ICD-10-CM | POA: Insufficient documentation

## 2018-04-02 DIAGNOSIS — Z515 Encounter for palliative care: Secondary | ICD-10-CM | POA: Diagnosis not present

## 2018-04-02 DIAGNOSIS — Z794 Long term (current) use of insulin: Secondary | ICD-10-CM | POA: Diagnosis not present

## 2018-04-02 DIAGNOSIS — G893 Neoplasm related pain (acute) (chronic): Secondary | ICD-10-CM | POA: Diagnosis not present

## 2018-04-02 DIAGNOSIS — Z86718 Personal history of other venous thrombosis and embolism: Secondary | ICD-10-CM

## 2018-04-02 MED ORDER — OXYCODONE HCL 5 MG PO TABS: 5.0000 mg | ORAL_TABLET | ORAL | 0 refills | Status: DC | PRN

## 2018-04-02 NOTE — Progress Notes (Signed)
[place]  Telephone:([phone] [phone]   Name: Donald Townsend Date: 04/02/2018 MRN: [id number]  DOB: [birth year]  Patient Care Team: Dion Body, MD as PCP - General (Family Medicine) Clent Jacks, RN as Registered Nurse Cammie Sickle, MD as Medical Oncologist (Medical Oncology)    REASON FOR CONSULTATION: Palliative Care consult requested for this 67 y.o. 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  He was most recently treated with Lonsurf but has decided to forego future treatment.  Palliative care was consulted to help establish goals.  SOCIAL HISTORY:    Patient is divorced.  He lives at home alone.  Is a daughter who is involved in his care.  He also has a sister involved.  Patient worked as a Psychologist, sport and exercise.  ADVANCE DIRECTIVES:  On file  CODE STATUS: DNR  PAST MEDICAL HISTORY: Past Medical History:  Diagnosis Date  . A-fib ([place])   . Cancer [facility])    Colon  . CHF (congestive heart failure) ([place])   . Diabetes mellitus without complication ([place])   . Dyspnea   . Elevated lipids   . Hypertension   . PVD (peripheral vascular disease) ([place])     PAST SURGICAL HISTORY:  Past Surgical History:  Procedure Laterality Date  . COLON RESECTION     with colostomy  . COLONOSCOPY    . CYSTOSCOPY WITH STENT PLACEMENT Bilateral 01/18/2017   Procedure: CYSTOSCOPY WITH STENT PLACEMENT;  Surgeon: [name], MD;  Location: [facility] ORS;  Service: Urology;  Laterality: Bilateral;  . IR FLUORO GUIDE PORT INSERTION RIGHT  01/24/2017  . IR NEPHROSTOGRAM LEFT THRU EXISTING ACCESS  09/18/2017  . IR NEPHROSTOGRAM RIGHT THRU EXISTING ACCESS  09/18/2017  . IR NEPHROSTOMY EXCHANGE LEFT  03/14/2017  . IR NEPHROSTOMY EXCHANGE LEFT  05/03/2017  . IR NEPHROSTOMY EXCHANGE  LEFT  06/14/2017  . IR NEPHROSTOMY EXCHANGE LEFT  07/26/2017  . IR NEPHROSTOMY EXCHANGE LEFT  09/18/2017  . IR NEPHROSTOMY EXCHANGE LEFT  11/08/2017  . IR NEPHROSTOMY EXCHANGE LEFT  12/20/2017  . IR NEPHROSTOMY EXCHANGE LEFT  01/24/2018  . IR NEPHROSTOMY EXCHANGE LEFT  03/14/2018  . IR NEPHROSTOMY EXCHANGE RIGHT  03/14/2017  . IR NEPHROSTOMY EXCHANGE RIGHT  05/03/2017  . IR NEPHROSTOMY EXCHANGE RIGHT  06/14/2017  . IR NEPHROSTOMY EXCHANGE RIGHT  07/26/2017  . IR NEPHROSTOMY EXCHANGE RIGHT  10/04/2017  . IR NEPHROSTOMY EXCHANGE RIGHT  11/08/2017  . IR NEPHROSTOMY EXCHANGE RIGHT  12/20/2017  . IR NEPHROSTOMY EXCHANGE RIGHT  01/24/2018  . IR NEPHROSTOMY EXCHANGE RIGHT  03/14/2018  . IR NEPHROSTOMY PLACEMENT LEFT  01/19/2017  . IR NEPHROSTOMY PLACEMENT RIGHT  01/19/2017  . IVC FILTER REMOVAL N/A 08/28/2016   Procedure: IVC Filter Removal;  Surgeon: Katha Cabal, MD;  Location: [place] [facility];  Service: Cardiovascular;  Laterality: N/A;  . LOWER EXTREMITY VENOGRAPHY Left 12/18/2016   Procedure: Lower Extremity Venography;  Surgeon: Katha Cabal, MD;  Location: [place] [facility];  Service: Cardiovascular;  Laterality: Left;  . LOWER EXTREMITY VENOGRAPHY Left 03/21/2018   Procedure: LOWER EXTREMITY VENOGRAPHY;  Surgeon: Katha Cabal, MD;  Location: [place] [facility];  Service: Cardiovascular;  Laterality: Left;  . PERIPHERAL VASCULAR CATHETERIZATION Left 05/08/2016   Procedure: Lower Extremity Venography;  Surgeon: Katha Cabal, MD;  Location: [place] [facility];  Service:  Cardiovascular;  Laterality: Left;    HEMATOLOGY/ONCOLOGY HISTORY:  Oncology History   # 2012- RECTO-SIGMOID-poorly diff STAGE III CA [s/p neo-adj chemo-RT- poor response]; APR [residual ypT3; 5/8 LN positiveDr.Smith/Dr.Pandit]  S/p FOLFOX   # SEP 2019- Pre-sacral fluid/mass- increasing ~ 5-6 cm [increased from dec 2017; also NEW retroperitoneal lymph nodes; inguinal adenopathy]; SEP 2018- left  supraclavicular; mediastinal; right hilar-retroperitoneal; pelvic mass/node; bilateral inguinal adenopathy; s/p right Ingiunal LNBx - RECURRENT CA [necrotic;insuff tissue for F-One]  # OCT 8th 2018-FOLFIRI [first cycle 5FU alone]; OCT 22nd FOLFIRI; April 2019- significant PR  # MAY 2019- FOLFIRI q 3W [pt pref]; AUG 2019- CT Progression/ ingiunal LN; Aug 26th Lonsurf; STOPPED sep 2019- sec to into;/OCT 17th CT-Progression  ------------------------------  ---  OCT-NOV 2018-Sepsis x2 sec to UTI [s/p ABx;]  # s/p RT rectal [11/28-last treatment]  # Acute renal insuff [s/p bil PCN]; hematuria- sec to bladder invol [? Contra-indication to avastin]  # LEFT LE DVT [? [facility]- Dr.Schneir]; Eliquis.   # CHF/COPD/CKD/ PN sec to [place]  # Foundation One-PDL-1/ TPS- 0% [2012-rectal specimen];12/26-Omniseq- K-RAS MUTATED;MSS; no targets** ---------------------------------------------   DIAGNOSIS: rectal cancer  STAGE:   IV  ; GOALS: palliative  CURRENT/MOST RECENT THERAPY: Surveillance/palliative care     Rectal cancer ([place])    ALLERGIES:  has No Known Allergies.  MEDICATIONS:  Current Outpatient Medications  Medication Sig Dispense Refill  . B-D ULTRAFINE III SHORT PEN 31G X 8 MM MISC     . Continuous Blood Gluc Receiver (FREESTYLE LIBRE READER) DEVI Use 1 Units as directed. Check CBG's four times daily. Dx: E11.9    . Continuous Blood Gluc Sensor (FREESTYLE LIBRE SENSOR SYSTEM) MISC Use 1 Units as directed. Check CBG's four times daily. Dx: E11.9    . DIGOX 250 MCG tablet Take 0.25 mg by mouth daily.     [name] ELIQUIS 5 MG TABS tablet Take 5 mg by mouth every 12 (twelve) hours.  0  . fentaNYL (DURAGESIC - DOSED MCG/HR) 25 MCG/HR patch Place 1 patch (25 mcg total) onto the skin every 3 (three) days. 10 patch 0  . furosemide (LASIX) 40 MG tablet Take 40 mg by mouth daily.   0  . glimepiride (AMARYL) 4 MG tablet Take 1 tablet by mouth 2 (two) times daily.     [name] HYDROcodone-acetaminophen  (NORCO) 10-325 MG tablet Take 1 tablet by mouth every 4 (four) hours as needed for moderate pain or severe pain. 60 tablet 0  . insulin aspart (NOVOLOG FLEXPEN) 100 UNIT/ML FlexPen Inject 3-15 Units into the skin 3 (three) times daily with meals. Sliding scale    . LANTUS SOLOSTAR 100 UNIT/ML Solostar Pen Inject 20 Units into the skin at bedtime.   0  . losartan (COZAAR) 100 MG tablet Take 100 mg by mouth daily.    . metoprolol tartrate (LOPRESSOR) 25 MG tablet Take 25 mg by mouth 2 (two) times daily.     . mirabegron ER (MYRBETRIQ) 25 MG TB24 tablet Take 25 mg by mouth daily.    . pravastatin (PRAVACHOL) 20 MG tablet Take 20 mg by mouth daily.     [name] sulfamethoxazole-trimethoprim (BACTRIM DS,SEPTRA DS) 800-160 MG tablet Take 1 tablet by mouth daily.     . magnesium hydroxide (MILK OF MAGNESIA) 400 MG/5ML suspension Take 30 mLs by mouth daily as needed for mild constipation. (Patient not taking: Reported on 04/02/2018) 360 mL 0  . ondansetron (ZOFRAN) 4 MG tablet Take 1 tablet (4 mg total) by mouth every  6 (six) hours as needed for nausea. (Patient not taking: Reported on 04/02/2018) 20 tablet 0   No current facility-administered medications for this visit.     VITAL SIGNS: BP 93/60   Pulse 88   Temp 97.8 F (36.6 C)   Resp 18   Wt (P) 237 lb (107.5 kg)   BMI (P) 33.53 kg/m  Filed Weights   04/02/18 1407  Weight: (P) 237 lb (107.5 kg)    Estimated body mass index is 33.53 kg/m (pended) as calculated from the following:   Height as of 03/21/18: 5' 10.5" (1.791 m).   Weight as of this encounter: (P) 237 lb (107.5 kg).  LABS: CBC:    Component Value Date/Time   WBC 11.6 (H) 03/22/2018 0628   HGB 9.0 (L) 03/22/2018 0628   HGB 13.9 12/17/2012 0608   HCT 28.6 (L) 03/22/2018 0628   HCT 38.8 (L) 12/17/2012 0608   PLT 298 03/22/2018 0628   PLT 147 (L) 12/17/2012 0608   MCV 85.6 03/22/2018 0628   MCV 87 12/17/2012 0608   NEUTROABS 10.6 (H) 03/19/2018 1404   NEUTROABS 4.7 12/17/2012  0608   LYMPHSABS 1.0 03/19/2018 1404   LYMPHSABS 0.8 (L) 12/17/2012 0608   MONOABS 0.7 03/19/2018 1404   MONOABS 0.5 12/17/2012 0608   EOSABS 1.3 (H) 03/19/2018 1404   EOSABS 0.1 12/17/2012 0608   BASOSABS 0.1 03/19/2018 1404   BASOSABS 0.0 12/17/2012 0608   Comprehensive Metabolic Panel:    Component Value Date/Time   NA 137 03/23/2018 0621   NA 139 12/17/2012 0608   K 5.0 03/23/2018 0621   K 3.8 12/17/2012 0608   CL 106 03/23/2018 0621   CL 108 (H) 12/17/2012 0608   CO2 26 03/23/2018 0621   CO2 26 12/17/2012 0608   BUN 25 (H) 03/23/2018 0621   BUN 21 01/01/2017 1621   BUN 15 12/17/2012 0608   CREATININE 1.91 (H) 03/23/2018 0621   CREATININE 0.84 12/17/2012 0608   GLUCOSE 115 (H) 03/23/2018 0621   GLUCOSE 217 (H) 12/17/2012 0608   CALCIUM 8.6 (L) 03/23/2018 0621   CALCIUM 8.2 (L) 12/17/2012 0608   AST 23 03/19/2018 1404   AST 16 12/17/2012 0608   ALT 15 03/19/2018 1404   ALT 23 12/17/2012 0608   ALKPHOS 108 03/19/2018 1404   ALKPHOS 78 12/17/2012 0608   BILITOT 0.5 03/19/2018 1404   BILITOT 0.7 12/17/2012 0608   PROT 7.0 03/19/2018 1404   PROT 5.8 (L) 12/17/2012 0608   ALBUMIN 2.7 (L) 03/19/2018 1404   ALBUMIN 2.9 (L) 12/17/2012 0608    RADIOGRAPHIC STUDIES: Ir Nephrostomy Exchange Left  Result Date: 03/14/2018 INDICATION: History of rectal cancer with chronic bilateral percutaneous nephrostomy catheters. Patient presents today for routine fluoroscopic guided nephrostomy catheter exchange. EXAM: FLUOROSCOPIC GUIDED BILATERAL SIDED NEPHROSTOMY CATHETER EXCHANGE COMPARISON:  None. CONTRAST:  A total of 20 mL Isovue-300 administered was administered into both collecting systems FLUOROSCOPY TIME:  36 seconds (40 mGy) COMPLICATIONS: None immediate. TECHNIQUE: Informed written consent was obtained from the patient after a discussion of the risks, benefits and alternatives to treatment. Questions regarding the procedure were encouraged and answered. A timeout was performed  prior to the initiation of the procedure. The bilateral flanks and external portions of existing nephrostomy catheters were prepped and draped in the usual sterile fashion. A sterile drape was applied covering the operative field. Maximum barrier sterile technique with sterile gowns and gloves were used for the procedure. A timeout was performed prior to the  initiation of the procedure. A pre procedural spot fluoroscopic image was obtained. Beginning with the left-sided nephrostomy, a small amount of contrast was injected via the existing left-sided nephrostomy catheter demonstrating appropriate positioning within the renal pelvis. The existing nephrostomy catheter was cut and cannulated with a Benson wire which was coiled within the renal pelvis. Under intermittent fluoroscopic guidance, the existing nephrostomy catheter was exchanged for a new 10.2 [place] all-purpose drainage catheter. Limited contrast injection confirmed appropriate positioning within the left renal pelvis and a post exchange fluoroscopic image was obtained. The catheter was locked and reconnected to a gravity bag. The identical repeat procedure was repeated for the contralateral right-sided nephrostomy, ultimately allowing successful exchange of a new 10.2 [place] all-purpose drainage catheter with end coiled and locked within the right renal pelvis. Dressings were placed. The patient tolerated the above procedures well without immediate postprocedural complication. FINDINGS: The existing nephrostomy catheters are appropriately positioned and functioning. After successful fluoroscopic guided exchange, new bilateral 10.2 French nephrostomy catheters are coiled and locked within the respective renal pelvises. IMPRESSION: Successful fluoroscopic guided exchange of bilateral 10.2 French percutaneous nephrostomy catheters. Electronically Signed   By: Sandi Mariscal M.D.   On: 03/14/2018 12:42   Ir Nephrostomy Exchange Right  Result Date:  03/14/2018 INDICATION: History of rectal cancer with chronic bilateral percutaneous nephrostomy catheters. Patient presents today for routine fluoroscopic guided nephrostomy catheter exchange. EXAM: FLUOROSCOPIC GUIDED BILATERAL SIDED NEPHROSTOMY CATHETER EXCHANGE COMPARISON:  None. CONTRAST:  A total of 20 mL Isovue-300 administered was administered into both collecting systems FLUOROSCOPY TIME:  36 seconds (40 mGy) COMPLICATIONS: None immediate. TECHNIQUE: Informed written consent was obtained from the patient after a discussion of the risks, benefits and alternatives to treatment. Questions regarding the procedure were encouraged and answered. A timeout was performed prior to the initiation of the procedure. The bilateral flanks and external portions of existing nephrostomy catheters were prepped and draped in the usual sterile fashion. A sterile drape was applied covering the operative field. Maximum barrier sterile technique with sterile gowns and gloves were used for the procedure. A timeout was performed prior to the initiation of the procedure. A pre procedural spot fluoroscopic image was obtained. Beginning with the left-sided nephrostomy, a small amount of contrast was injected via the existing left-sided nephrostomy catheter demonstrating appropriate positioning within the renal pelvis. The existing nephrostomy catheter was cut and cannulated with a Benson wire which was coiled within the renal pelvis. Under intermittent fluoroscopic guidance, the existing nephrostomy catheter was exchanged for a new 10.2 [place] all-purpose drainage catheter. Limited contrast injection confirmed appropriate positioning within the left renal pelvis and a post exchange fluoroscopic image was obtained. The catheter was locked and reconnected to a gravity bag. The identical repeat procedure was repeated for the contralateral right-sided nephrostomy, ultimately allowing successful exchange of a new 10.2 [place] all-purpose  drainage catheter with end coiled and locked within the right renal pelvis. Dressings were placed. The patient tolerated the above procedures well without immediate postprocedural complication. FINDINGS: The existing nephrostomy catheters are appropriately positioned and functioning. After successful fluoroscopic guided exchange, new bilateral 10.2 French nephrostomy catheters are coiled and locked within the respective renal pelvises. IMPRESSION: Successful fluoroscopic guided exchange of bilateral 10.2 French percutaneous nephrostomy catheters. Electronically Signed   By: Sandi Mariscal M.D.   On: 03/14/2018 12:42    PERFORMANCE STATUS (ECOG) : 2  Review of Systems As noted above. Otherwise, a complete review of systems is negative.  Physical Exam General: NAD, frail appearing Pulmonary:  unlabored, CTA ant/post fields Cards: RRR Extremities: Bilateral lower extremity edema L>R Skin: no rashes Neurological: Weakness but otherwise nonfocal  IMPRESSION: Follow up visit with patient and his sister.   Patient was hospitalized 11/22 to 11/24 with left lower extremity DVT and underwent venous thrombectomy with stenting.  Since his procedure is swelling in the left lower extremity is improved.  However, patient continues to have persistent and severe pain in the left groin and sacral region.  He is using transdermal fentanyl and PRN Norco.  Patient is requiring Norco every 3-4 hours.  He does not feel that the fentanyl is very efficacious.  Patient asked about interventions for pain.  We discussed referral to interventional pain management but patient was not interested in this option.  We talked about various strategies for adjusting his pain regimen including increased fentanyl versus rotating him to another short acting opioid.  Patient would prefer short acting pain meds.  However, he would agree with increased fentanyl if rotating his short acting opioid is ineffective.  Will d/c Norco and start  oxycodone 5 to 10 mg every 4 hours as needed.  Patient can take scheduled acetaminophen 650 mg 3-4 times daily.  Prophylactic bowel regimen was also discussed.   Both patient and family report progressive weakness over the past month.  Patient is still living alone and is independent with his activities of daily living but this has become increasingly difficult.  We talked about resources in the home such as hospice.  However, patient said he was not yet ready for this.  PLAN: Continue supportive care Continue transdermal fentanyl Discontinue Norco Start oxycodone IR 5 to 10 mg every 4 hours as needed for pain Prophylactic bowel regimen Recommend hospice if patient experiences future decline RTC in 2 weeks   Patient expressed understanding and was in agreement with this plan. He also understands that He can call clinic at any time with any questions, concerns, or complaints.    Time Total: 30 minutes  Visit consisted of counseling and education dealing with the complex and emotionally intense issues of symptom management and palliative care in the setting of serious and potentially life-threatening illness.Greater than 50%  of this time was spent counseling and coordinating care related to the above assessment and plan.  Signed by: Altha Harm, PhD, DNP, NP-C, [facility] [phone] (Work Cell)

## 2018-04-02 NOTE — Progress Notes (Signed)
Billey Chang, NP made aware of all BP's for patient.

## 2018-04-03 ENCOUNTER — Telehealth: Payer: Self-pay | Admitting: Internal Medicine

## 2018-04-03 ENCOUNTER — Telehealth: Payer: Self-pay | Admitting: Hospice and Palliative Medicine

## 2018-04-03 ENCOUNTER — Telehealth: Payer: Self-pay | Admitting: *Deleted

## 2018-04-03 NOTE — Telephone Encounter (Signed)
I spoke with [employer]. Patient is electing for hospice services. Will initiate referral.

## 2018-04-03 NOTE — Telephone Encounter (Signed)
Returned family call re: "pt having chills". Tried calling 3 times [thru operator]- left message to call us back.  GB

## 2018-04-03 NOTE — Telephone Encounter (Signed)
Call received from family member stating that patient was having chills, fever, and sweats and was told by Dr B to go to the ER Patient did not feel that was necessary and did not go. They feel it was his sugar out of control. She is requesting that Donald Pew, NP return the call as patient wants an in home palliative care referral

## 2018-04-03 NOTE — Telephone Encounter (Signed)
I spoke with patient and sister by phone.  Patient says that his earlier symptoms have now resolved and he is feeling much better.  He denies currently having any fever or chills.  He refused to come to the hospital as previously recommended.  Patient says he is interested instead in having home hospice that can come out and check on him in the event of future issues.  We talked at length about the differences between palliative care in the home versus hospice in the home.  Again, patient says he would be interested in hospice.  We will send referral.  This was discussed with Dr. Rogue Bussing.

## 2018-04-03 NOTE — Telephone Encounter (Signed)
Spoke to daughter- pt having significant chills. Clinically suspicious for sepsis. Recommend ER evaluation. Daughter verbalizes.  GB

## 2018-04-08 ENCOUNTER — Encounter (INDEPENDENT_AMBULATORY_CARE_PROVIDER_SITE_OTHER): Payer: Self-pay

## 2018-04-08 ENCOUNTER — Encounter: Payer: Self-pay | Admitting: Internal Medicine

## 2018-04-08 ENCOUNTER — Telehealth: Payer: Self-pay | Admitting: Hospice and Palliative Medicine

## 2018-04-08 NOTE — Telephone Encounter (Signed)
Please give the patient's daughter a call back to let them know that we will still have Donald Townsend come see [place] on Thursday still but we will have him come in at 1 pm instead to get an ultrasound before he sees Dr. Delana Meyer.  As far as the question about pain medication goes, we will defer to [name].    If wearing the compression hose are too tight, you can leave them off until he is seen Thursday.

## 2018-04-08 NOTE — Telephone Encounter (Signed)
Spoke with patients daughter Nira Conn and she is aware of the ultrasound and to arrive by 1pm. She stated that she did talk to Dr. Regenia Skeeter today.

## 2018-04-08 NOTE — Telephone Encounter (Signed)
I spoke by phone with patient's daughter.  She reports the patient has had worsening lower extremity edema and pain over the past week since patient had his DVT removed.  She has contacted Dr. Delana Meyer for advice.  Patient has been taking 1 tablet of the 5 mg oxycodone every 3-4 hours.  His daughter stopped the transdermal fentanyl over the weekend due to concerns with concurrent use with oxycodone.  We talked about the differences between long-acting and short acting opioids.  She plans to restart the fentanyl patch today.  Patient can increase the oxycodone to 2 tablets every 3-4 hours as needed. He can also take scheduled acetaminophen 650mg  TID-QID with the oxycodone. Would consider increasing the fentanyl if patient is still requiring frequent oxycodone after the fentanyl is restarted.  Patient is scheduled to be admitted to hospice care on 04/10/18.

## 2018-04-10 ENCOUNTER — Ambulatory Visit (INDEPENDENT_AMBULATORY_CARE_PROVIDER_SITE_OTHER): Payer: Medicare Other

## 2018-04-10 ENCOUNTER — Other Ambulatory Visit (INDEPENDENT_AMBULATORY_CARE_PROVIDER_SITE_OTHER): Payer: Self-pay | Admitting: Nurse Practitioner

## 2018-04-10 ENCOUNTER — Other Ambulatory Visit: Payer: Self-pay | Admitting: *Deleted

## 2018-04-10 ENCOUNTER — Ambulatory Visit (INDEPENDENT_AMBULATORY_CARE_PROVIDER_SITE_OTHER): Payer: Medicare Other | Admitting: Vascular Surgery

## 2018-04-10 ENCOUNTER — Encounter (INDEPENDENT_AMBULATORY_CARE_PROVIDER_SITE_OTHER): Payer: Self-pay | Admitting: Vascular Surgery

## 2018-04-10 VITALS — BP 83/57 | HR 76 | Resp 22 | Ht 70.5 in | Wt 226.0 lb

## 2018-04-10 DIAGNOSIS — R600 Localized edema: Secondary | ICD-10-CM

## 2018-04-10 DIAGNOSIS — M7989 Other specified soft tissue disorders: Secondary | ICD-10-CM

## 2018-04-10 DIAGNOSIS — E119 Type 2 diabetes mellitus without complications: Secondary | ICD-10-CM | POA: Diagnosis not present

## 2018-04-10 DIAGNOSIS — Z9862 Peripheral vascular angioplasty status: Secondary | ICD-10-CM

## 2018-04-10 DIAGNOSIS — Z86718 Personal history of other venous thrombosis and embolism: Secondary | ICD-10-CM

## 2018-04-10 DIAGNOSIS — I82412 Acute embolism and thrombosis of left femoral vein: Secondary | ICD-10-CM | POA: Diagnosis not present

## 2018-04-10 DIAGNOSIS — I4811 Longstanding persistent atrial fibrillation: Secondary | ICD-10-CM | POA: Diagnosis not present

## 2018-04-10 DIAGNOSIS — C20 Malignant neoplasm of rectum: Secondary | ICD-10-CM | POA: Diagnosis not present

## 2018-04-10 MED ORDER — GABAPENTIN 300 MG PO CAPS: 300.0000 mg | ORAL_CAPSULE | Freq: Every day | ORAL | 0 refills | Status: DC

## 2018-04-10 MED ORDER — OXYCODONE HCL 5 MG PO TABS: 5.0000 mg | ORAL_TABLET | ORAL | 0 refills | Status: DC | PRN

## 2018-04-10 NOTE — Progress Notes (Signed)
MRN : [id number]  Donald Townsend is a 67 y.o. (10-29-1950) male who presents with chief complaint of  Chief Complaint  Patient presents with  . Follow-up  .  History of Present Illness:    The patient presents to the office for evaluation of DVT.  DVT was identified at [facility] by Duplex ultrasound.  The initial symptoms were pain and swelling in the lower extremity.  The patient notes the leg continues to be very painful with dependency and swells quite a bite.  Symptoms are much better with elevation.  The patient notes minimal edema in the morning which steadily worsens throughout the day.    The patient has not been using compression therapy at this point.  No SOB or pleuritic chest pains.  No cough or hemoptysis.  No blood per rectum or blood in any sputum.  No excessive bruising per the patient.       Current Meds  Medication Sig  . B-D ULTRAFINE III SHORT PEN 31G X 8 MM MISC   . Continuous Blood Gluc Receiver (FREESTYLE LIBRE READER) DEVI Use 1 Units as directed. Check CBG's four times daily. Dx: E11.9  . Continuous Blood Gluc Sensor (FREESTYLE LIBRE SENSOR SYSTEM) MISC Use 1 Units as directed. Check CBG's four times daily. Dx: E11.9  . DIGOX 250 MCG tablet Take 0.25 mg by mouth daily.   [name] ELIQUIS 5 MG TABS tablet Take 5 mg by mouth every 12 (twelve) hours.  . fentaNYL (DURAGESIC - DOSED MCG/HR) 25 MCG/HR patch Place 1 patch (25 mcg total) onto the skin every 3 (three) days.  . furosemide (LASIX) 40 MG tablet Take 40 mg by mouth daily.   [name] glimepiride (AMARYL) 4 MG tablet Take 1 tablet by mouth 2 (two) times daily.   . insulin aspart (NOVOLOG FLEXPEN) 100 UNIT/ML FlexPen Inject 3-15 Units into the skin 3 (three) times daily with meals. Sliding scale  . LANTUS SOLOSTAR 100 UNIT/ML Solostar Pen Inject 20 Units into the skin at bedtime.   [name] losartan (COZAAR) 100 MG tablet Take 100 mg by mouth daily.  . magnesium hydroxide (MILK OF MAGNESIA) 400 MG/5ML suspension Take 30  mLs by mouth daily as needed for mild constipation.  . metoprolol tartrate (LOPRESSOR) 25 MG tablet Take 25 mg by mouth 2 (two) times daily.   . mirabegron ER (MYRBETRIQ) 25 MG TB24 tablet Take 25 mg by mouth daily.  . ondansetron (ZOFRAN) 4 MG tablet Take 1 tablet (4 mg total) by mouth every 6 (six) hours as needed for nausea.  . pravastatin (PRAVACHOL) 20 MG tablet Take 20 mg by mouth daily.   [name] sulfamethoxazole-trimethoprim (BACTRIM DS,SEPTRA DS) 800-160 MG tablet Take 1 tablet by mouth daily.   . [DISCONTINUED] oxyCODONE (OXY IR/ROXICODONE) 5 MG immediate release tablet Take 1-2 tablets (5-10 mg total) by mouth every 4 (four) hours as needed for breakthrough pain.    Past Medical History:  Diagnosis Date  . A-fib ([place])   . Cancer [facility])    Colon  . CHF (congestive heart failure) ([place])   . Diabetes mellitus without complication ([place])   . Dyspnea   . Elevated lipids   . Hypertension   . PVD (peripheral vascular disease) ([place])     Past Surgical History:  Procedure Laterality Date  . COLON RESECTION     with colostomy  . COLONOSCOPY    . CYSTOSCOPY WITH STENT PLACEMENT Bilateral 01/18/2017   Procedure: CYSTOSCOPY WITH STENT PLACEMENT;  Surgeon: [name],  MD;  Location: [facility] ORS;  Service: Urology;  Laterality: Bilateral;  . IR FLUORO GUIDE PORT INSERTION RIGHT  01/24/2017  . IR NEPHROSTOGRAM LEFT THRU EXISTING ACCESS  09/18/2017  . IR NEPHROSTOGRAM RIGHT THRU EXISTING ACCESS  09/18/2017  . IR NEPHROSTOMY EXCHANGE LEFT  03/14/2017  . IR NEPHROSTOMY EXCHANGE LEFT  05/03/2017  . IR NEPHROSTOMY EXCHANGE LEFT  06/14/2017  . IR NEPHROSTOMY EXCHANGE LEFT  07/26/2017  . IR NEPHROSTOMY EXCHANGE LEFT  09/18/2017  . IR NEPHROSTOMY EXCHANGE LEFT  11/08/2017  . IR NEPHROSTOMY EXCHANGE LEFT  12/20/2017  . IR NEPHROSTOMY EXCHANGE LEFT  01/24/2018  . IR NEPHROSTOMY EXCHANGE LEFT  03/14/2018  . IR NEPHROSTOMY EXCHANGE RIGHT  03/14/2017  . IR NEPHROSTOMY EXCHANGE RIGHT  05/03/2017  . IR  NEPHROSTOMY EXCHANGE RIGHT  06/14/2017  . IR NEPHROSTOMY EXCHANGE RIGHT  07/26/2017  . IR NEPHROSTOMY EXCHANGE RIGHT  10/04/2017  . IR NEPHROSTOMY EXCHANGE RIGHT  11/08/2017  . IR NEPHROSTOMY EXCHANGE RIGHT  12/20/2017  . IR NEPHROSTOMY EXCHANGE RIGHT  01/24/2018  . IR NEPHROSTOMY EXCHANGE RIGHT  03/14/2018  . IR NEPHROSTOMY PLACEMENT LEFT  01/19/2017  . IR NEPHROSTOMY PLACEMENT RIGHT  01/19/2017  . IVC FILTER REMOVAL N/A 08/28/2016   Procedure: IVC Filter Removal;  Surgeon: Katha Cabal, MD;  Location: [place] [facility];  Service: Cardiovascular;  Laterality: N/A;  . LOWER EXTREMITY VENOGRAPHY Left 12/18/2016   Procedure: Lower Extremity Venography;  Surgeon: Katha Cabal, MD;  Location: [place] [facility];  Service: Cardiovascular;  Laterality: Left;  . LOWER EXTREMITY VENOGRAPHY Left 03/21/2018   Procedure: LOWER EXTREMITY VENOGRAPHY;  Surgeon: Katha Cabal, MD;  Location: [place] [facility];  Service: Cardiovascular;  Laterality: Left;  . PERIPHERAL VASCULAR CATHETERIZATION Left 05/08/2016   Procedure: Lower Extremity Venography;  Surgeon: Katha Cabal, MD;  Location: [place] [facility];  Service: Cardiovascular;  Laterality: Left;    Social History Social History   Tobacco Use  . Smoking status: Never Smoker  . Smokeless tobacco: Never Used  Substance Use Topics  . Alcohol use: No  . Drug use: No    Family History Family History  Problem Relation Age of Onset  . Diabetes Mother   . Stroke Mother   . Prostate cancer Brother   . Cancer Sister        Breast cancer  . Kidney cancer Neg Hx   . Bladder Cancer Neg Hx     No Known Allergies   REVIEW OF SYSTEMS (Negative unless checked)  Constitutional: [] Weight loss  [] Fever  [] Chills Cardiac: [] Chest pain   [] Chest pressure   [] Palpitations   [] Shortness of breath when laying flat   [] Shortness of breath with exertion. Vascular:  [x] Pain in legs with walking   [x] Pain in legs at rest  [] History of DVT    [] Phlebitis   [x] Swelling in legs   [] Varicose veins   [] Non-healing ulcers Pulmonary:   [] Uses home oxygen   [] Productive cough   [] Hemoptysis   [] Wheeze  [] COPD   [] Asthma Neurologic:  [] Dizziness   [] Seizures   [] History of stroke   [] History of TIA  [] Aphasia   [] Vissual changes   [] Weakness or numbness in arm   [] Weakness or numbness in leg Musculoskeletal:   [] Joint swelling   [] Joint pain   [] Low back pain Hematologic:  [] Easy bruising  [] Easy bleeding   [] Hypercoagulable state   [] Anemic Gastrointestinal:  [] Diarrhea   [] Vomiting  [] Gastroesophageal reflux/heartburn   [] Difficulty swallowing. Genitourinary:  []   Chronic kidney disease   [] Difficult urination  [] Frequent urination   [] Blood in urine Skin:  [] Rashes   [] Ulcers  Psychological:  [] History of anxiety   []  History of major depression.  Physical Examination  Vitals:   04/10/18 1412  BP: (!) 83/57  Pulse: 76  Resp: (!) 22  Weight: 226 lb (102.5 kg)  Height: 5' 10.5" (1.791 m)   Body mass index is 31.97 kg/m. Gen: WD/WN, NAD Head: [place]/AT, No temporalis wasting.  Ear/Nose/Throat: Hearing grossly intact, nares w/o erythema or drainage Eyes: PER, EOMI, sclera nonicteric.  Neck: Supple, no large masses.   Pulmonary:  Good air movement, no audible wheezing bilaterally, no use of accessory muscles.  Cardiac: RRR, no JVD Vascular: scattered varicosities present bilaterally.  Mild venous stasis changes to the legs bilaterally.  4+ hard pitting edema left leg Vessel Right Left  Radial Palpable Palpable  Gastrointestinal: Non-distended. No guarding/no peritoneal signs.  Musculoskeletal: M/S 5/5 throughout.  No deformity or atrophy.  Neurologic: CN 2-12 intact. Symmetrical.  Speech is fluent. Motor exam as listed above. Psychiatric: Judgment intact, Mood & affect appropriate for pt's clinical situation. Dermatologic: No rashes or ulcers noted.  No changes consistent with cellulitis. Lymph : No lichenification or skin changes of  chronic lymphedema.  CBC Lab Results  Component Value Date   WBC 11.6 (H) 03/22/2018   HGB 9.0 (L) 03/22/2018   HCT 28.6 (L) 03/22/2018   MCV 85.6 03/22/2018   PLT 298 03/22/2018    BMET    Component Value Date/Time   NA 137 03/23/2018 0621   NA 139 12/17/2012 0608   K 5.0 03/23/2018 0621   K 3.8 12/17/2012 0608   CL 106 03/23/2018 0621   CL 108 (H) 12/17/2012 0608   CO2 26 03/23/2018 0621   CO2 26 12/17/2012 0608   GLUCOSE 115 (H) 03/23/2018 0621   GLUCOSE 217 (H) 12/17/2012 0608   BUN 25 (H) 03/23/2018 0621   BUN 21 01/01/2017 1621   BUN 15 12/17/2012 0608   CREATININE 1.91 (H) 03/23/2018 0621   CREATININE 0.84 12/17/2012 0608   CALCIUM 8.6 (L) 03/23/2018 0621   CALCIUM 8.2 (L) 12/17/2012 0608   GFRNONAA 35 (L) 03/23/2018 0621   GFRNONAA >60 12/17/2012 0608   GFRAA 40 (L) 03/23/2018 0621   GFRAA >60 12/17/2012 1324   Estimated Creatinine Clearance: 45.4 mL/min (A) (by C-G formula based on SCr of 1.91 mg/dL (H)).  COAG Lab Results  Component Value Date   INR 1.29 03/21/2018   INR 1.31 01/31/2017   INR 1.11 01/24/2017    Radiology Ir Nephrostomy Exchange Left  Result Date: 03/14/2018 INDICATION: History of rectal cancer with chronic bilateral percutaneous nephrostomy catheters. Patient presents today for routine fluoroscopic guided nephrostomy catheter exchange. EXAM: FLUOROSCOPIC GUIDED BILATERAL SIDED NEPHROSTOMY CATHETER EXCHANGE COMPARISON:  None. CONTRAST:  A total of 20 mL Isovue-300 administered was administered into both collecting systems FLUOROSCOPY TIME:  36 seconds (40 mGy) COMPLICATIONS: None immediate. TECHNIQUE: Informed written consent was obtained from the patient after a discussion of the risks, benefits and alternatives to treatment. Questions regarding the procedure were encouraged and answered. A timeout was performed prior to the initiation of the procedure. The bilateral flanks and external portions of existing nephrostomy catheters were  prepped and draped in the usual sterile fashion. A sterile drape was applied covering the operative field. Maximum barrier sterile technique with sterile gowns and gloves were used for the procedure. A timeout was performed prior to the initiation of  the procedure. A pre procedural spot fluoroscopic image was obtained. Beginning with the left-sided nephrostomy, a small amount of contrast was injected via the existing left-sided nephrostomy catheter demonstrating appropriate positioning within the renal pelvis. The existing nephrostomy catheter was cut and cannulated with a Benson wire which was coiled within the renal pelvis. Under intermittent fluoroscopic guidance, the existing nephrostomy catheter was exchanged for a new 10.2 [place] all-purpose drainage catheter. Limited contrast injection confirmed appropriate positioning within the left renal pelvis and a post exchange fluoroscopic image was obtained. The catheter was locked and reconnected to a gravity bag. The identical repeat procedure was repeated for the contralateral right-sided nephrostomy, ultimately allowing successful exchange of a new 10.2 [place] all-purpose drainage catheter with end coiled and locked within the right renal pelvis. Dressings were placed. The patient tolerated the above procedures well without immediate postprocedural complication. FINDINGS: The existing nephrostomy catheters are appropriately positioned and functioning. After successful fluoroscopic guided exchange, new bilateral 10.2 French nephrostomy catheters are coiled and locked within the respective renal pelvises. IMPRESSION: Successful fluoroscopic guided exchange of bilateral 10.2 French percutaneous nephrostomy catheters. Electronically Signed   By: Sandi Mariscal M.D.   On: 03/14/2018 12:42   Ir Nephrostomy Exchange Right  Result Date: 03/14/2018 INDICATION: History of rectal cancer with chronic bilateral percutaneous nephrostomy catheters. Patient presents today for  routine fluoroscopic guided nephrostomy catheter exchange. EXAM: FLUOROSCOPIC GUIDED BILATERAL SIDED NEPHROSTOMY CATHETER EXCHANGE COMPARISON:  None. CONTRAST:  A total of 20 mL Isovue-300 administered was administered into both collecting systems FLUOROSCOPY TIME:  36 seconds (40 mGy) COMPLICATIONS: None immediate. TECHNIQUE: Informed written consent was obtained from the patient after a discussion of the risks, benefits and alternatives to treatment. Questions regarding the procedure were encouraged and answered. A timeout was performed prior to the initiation of the procedure. The bilateral flanks and external portions of existing nephrostomy catheters were prepped and draped in the usual sterile fashion. A sterile drape was applied covering the operative field. Maximum barrier sterile technique with sterile gowns and gloves were used for the procedure. A timeout was performed prior to the initiation of the procedure. A pre procedural spot fluoroscopic image was obtained. Beginning with the left-sided nephrostomy, a small amount of contrast was injected via the existing left-sided nephrostomy catheter demonstrating appropriate positioning within the renal pelvis. The existing nephrostomy catheter was cut and cannulated with a Benson wire which was coiled within the renal pelvis. Under intermittent fluoroscopic guidance, the existing nephrostomy catheter was exchanged for a new 10.2 [place] all-purpose drainage catheter. Limited contrast injection confirmed appropriate positioning within the left renal pelvis and a post exchange fluoroscopic image was obtained. The catheter was locked and reconnected to a gravity bag. The identical repeat procedure was repeated for the contralateral right-sided nephrostomy, ultimately allowing successful exchange of a new 10.2 [place] all-purpose drainage catheter with end coiled and locked within the right renal pelvis. Dressings were placed. The patient tolerated the above  procedures well without immediate postprocedural complication. FINDINGS: The existing nephrostomy catheters are appropriately positioned and functioning. After successful fluoroscopic guided exchange, new bilateral 10.2 French nephrostomy catheters are coiled and locked within the respective renal pelvises. IMPRESSION: Successful fluoroscopic guided exchange of bilateral 10.2 French percutaneous nephrostomy catheters. Electronically Signed   By: Sandi Mariscal M.D.   On: 03/14/2018 12:42   Vas [place] Lower Extremity Venous (dvt)  Result Date: 04/10/2018  Lower Venous Study Indications: Pain, and Swelling.  Risk Factors: Surgery Left iliofemoral thrombectomies/PTAs on 05/08/16, 12/18/16 & 03/21/18. Performing  Technologist: Almira Coaster RVS  Examination Guidelines: A complete evaluation includes B-mode imaging, spectral Doppler, color Doppler, and power Doppler as needed of all accessible portions of each vessel. Bilateral testing is considered an integral part of a complete examination. Limited examinations for reoccurring indications may be performed as noted.  Left Venous Findings: +---------+---------------+---------+-----------+----------+-------+          CompressibilityPhasicitySpontaneityPropertiesSummary +---------+---------------+---------+-----------+----------+-------+ CFV      Full                                                 +---------+---------------+---------+-----------+----------+-------+ FV Prox  Full                                                 +---------+---------------+---------+-----------+----------+-------+ FV Mid   Full                                                 +---------+---------------+---------+-----------+----------+-------+ FV DistalFull                                                 +---------+---------------+---------+-----------+----------+-------+ POP      Full                                                  +---------+---------------+---------+-----------+----------+-------+ PTV      Full                                                 +---------+---------------+---------+-----------+----------+-------+ PERO     Full                                                 +---------+---------------+---------+-----------+----------+-------+ GSV      Full                                                 +---------+---------------+---------+-----------+----------+-------+ SSV      Full                                                 +---------+---------------+---------+-----------+----------+-------+  Left Reflux Technical Findings: The Left GSV Mid Calf area displays Reflux >571ms.  Summary: Left: There is no evidence of deep vein thrombosis in the lower extremity.There is no evidence of superficial venous thrombosis.  *See table(s) above  for measurements and observations. Electronically signed by Hortencia Pilar MD on 04/10/2018 at 5:13:29 PM.    Final      Assessment/Plan 1. Deep vein thrombosis (DVT) of femoral vein of left lower extremity, unspecified chronicity (HCC) I believe that his pain is secondary to his rectal tumor.  I have communicated with both Paliative care and Oncology he have a plan and he will continue with Hospice   A total of 30 minutes was spent with this patient and greater than 50% was spent in counseling and coordination of care with the patient.  Discussion included the treatment options for vascular disease including indications for surgery and intervention.  Also discussed is the appropriate timing of treatment.  In addition medical therapy was discussed.  2. Rectal cancer ([place]) See #1  3. Type 2 diabetes mellitus without complication, without long-term current use of insulin (HCC) Continue hypoglycemic medications as already ordered, these medications have been reviewed and there are no changes at this time.  Hgb A1C to be monitored as already arranged by  primary service   4. Longstanding persistent atrial fibrillation Continue antiarrhythmia medications as already ordered, these medications have been reviewed and there are no changes at this time.  Continue anticoagulation as ordered by Cardiology Service     Hortencia Pilar, MD  04/10/2018 8:09 PM

## 2018-04-11 ENCOUNTER — Encounter (INDEPENDENT_AMBULATORY_CARE_PROVIDER_SITE_OTHER): Payer: Self-pay

## 2018-04-11 ENCOUNTER — Telehealth: Payer: Self-pay | Admitting: Hospice and Palliative Medicine

## 2018-04-11 NOTE — Telephone Encounter (Signed)
Case discussed yesterday (04/10/18) with Dr. Delana Meyer.  Patient was having increased pain in his office.  He was started on gabapentin and ordered to increase his transdermal fentanyl to 50 mcg every 72 hours.  I called yesterday and spoke with daughter to confirm plan for symptom management.  Also spoke with the hospice team and requested a visit on 04/11/2018 to help address symptom management  Today, I spoke with the hospice nurse who called patient.  Reportedly, patient refused the visit this morning with the hospice nurse.  He told her that he was feeling much better and that the gabapentin seemed to help.  Hospice will follow and communicate with [place] regarding any of patient's needs.

## 2018-04-14 ENCOUNTER — Telehealth: Payer: Self-pay | Admitting: *Deleted

## 2018-04-14 ENCOUNTER — Other Ambulatory Visit: Payer: Self-pay | Admitting: Nurse Practitioner

## 2018-04-14 ENCOUNTER — Other Ambulatory Visit: Payer: Self-pay | Admitting: *Deleted

## 2018-04-14 MED ORDER — MORPHINE SULFATE ER 30 MG PO TBCR: 30.0000 mg | EXTENDED_RELEASE_TABLET | Freq: Two times a day (BID) | ORAL | 0 refills | Status: DC

## 2018-04-14 MED ORDER — FENTANYL 50 MCG/HR TD PT72: 50.0000 ug | MEDICATED_PATCH | TRANSDERMAL | 0 refills | Status: DC

## 2018-04-14 MED ORDER — MORPHINE SULFATE ER 15 MG PO TBCR: 15.0000 mg | EXTENDED_RELEASE_TABLET | Freq: Two times a day (BID) | ORAL | 0 refills | Status: DC

## 2018-04-14 NOTE — Progress Notes (Signed)
Fentanyl discontinued. Order for MS Contin (total dose of 45 mg) q12 hours sent to pharmacy.

## 2018-04-14 NOTE — Telephone Encounter (Signed)
Order changed and sent to pharmacy.

## 2018-04-14 NOTE — Telephone Encounter (Signed)
Hospice nurse called reporting that Fentanyl does not seem to be controlling patient pain as he is still using Oxycodone every 2 hours and he keeps finding his patches on his shirt and not his skin. She is asking if we can change Fentanyl 50 mcg to MS ER. Please advise

## 2018-04-14 NOTE — Telephone Encounter (Signed)
Sarah notified and will let pharmacy know to cancel Fentanyl prescription.

## 2018-04-15 ENCOUNTER — Other Ambulatory Visit: Payer: Self-pay | Admitting: Radiology

## 2018-04-15 DIAGNOSIS — N135 Crossing vessel and stricture of ureter without hydronephrosis: Secondary | ICD-10-CM

## 2018-04-15 DIAGNOSIS — N131 Hydronephrosis with ureteral stricture, not elsewhere classified: Secondary | ICD-10-CM

## 2018-04-15 MED ORDER — CIPROFLOXACIN HCL 500 MG PO TABS: 250.0000 mg | ORAL_TABLET | Freq: Two times a day (BID) | ORAL | 0 refills | Status: DC

## 2018-04-15 MED ORDER — CIPROFLOXACIN HCL 500 MG PO TABS: 250.0000 mg | ORAL_TABLET | Freq: Two times a day (BID) | ORAL | 0 refills | Status: AC

## 2018-04-15 MED ORDER — CIPROFLOXACIN HCL 500 MG PO TABS: 500.0000 mg | ORAL_TABLET | Freq: Two times a day (BID) | ORAL | 0 refills | Status: DC

## 2018-04-16 ENCOUNTER — Inpatient Hospital Stay: Payer: Medicare Other | Admitting: Internal Medicine

## 2018-04-16 ENCOUNTER — Inpatient Hospital Stay: Payer: Medicare Other | Admitting: Hospice and Palliative Medicine

## 2018-04-16 ENCOUNTER — Inpatient Hospital Stay: Payer: Medicare Other

## 2018-04-18 ENCOUNTER — Telehealth: Payer: Self-pay | Admitting: *Deleted

## 2018-04-18 MED ORDER — MORPHINE SULFATE ER 30 MG PO TBCR: 30.0000 mg | EXTENDED_RELEASE_TABLET | Freq: Three times a day (TID) | ORAL | 0 refills | Status: DC

## 2018-04-18 MED ORDER — MORPHINE SULFATE ER 15 MG PO TBCR: 15.0000 mg | EXTENDED_RELEASE_TABLET | Freq: Three times a day (TID) | ORAL | 0 refills | Status: DC

## 2018-04-18 MED ORDER — GABAPENTIN 300 MG PO CAPS: 300.0000 mg | ORAL_CAPSULE | Freq: Two times a day (BID) | ORAL | 0 refills | Status: AC

## 2018-04-18 NOTE — Telephone Encounter (Addendum)
Hospice nurse requesting increase in patient pain medicine to 45 mg THREE TIMES A DAY (currently twice a day) and also to increase his gabapentin (currently on 300 mg at hs) He is having leg pain. Please advise

## 2018-04-18 NOTE — Telephone Encounter (Signed)
Sarah informed of increases in meds

## 2018-04-18 NOTE — Telephone Encounter (Signed)
Donald Townsend- ok with me to increase his MS contin to 45 mg TID; and also increase gabapentin to 300 mg BID. Thx

## 2018-04-21 ENCOUNTER — Other Ambulatory Visit: Payer: Self-pay | Admitting: *Deleted

## 2018-04-21 MED ORDER — MORPHINE SULFATE ER 15 MG PO TBCR: 15.0000 mg | EXTENDED_RELEASE_TABLET | Freq: Three times a day (TID) | ORAL | 0 refills | Status: DC

## 2018-04-21 MED ORDER — DEXAMETHASONE 4 MG PO TABS: 4.0000 mg | ORAL_TABLET | Freq: Every day | ORAL | 3 refills | Status: AC

## 2018-04-21 MED ORDER — MORPHINE SULFATE ER 30 MG PO TBCR: 30.0000 mg | EXTENDED_RELEASE_TABLET | Freq: Three times a day (TID) | ORAL | 0 refills | Status: DC

## 2018-04-21 NOTE — Telephone Encounter (Signed)
Donald Townsend called back asking for low dose of steroids for his hip and knee pain. Please advise

## 2018-04-21 NOTE — Addendum Note (Signed)
Addended by: Betti Cruz on: 04/21/2018 02:03 PM   Modules accepted: Orders

## 2018-04-22 ENCOUNTER — Other Ambulatory Visit: Payer: Self-pay | Admitting: Hospice and Palliative Medicine

## 2018-04-22 ENCOUNTER — Other Ambulatory Visit: Payer: Self-pay | Admitting: *Deleted

## 2018-04-22 MED ORDER — OXYCODONE HCL 5 MG PO TABS: 5.0000 mg | ORAL_TABLET | ORAL | 0 refills | Status: DC | PRN

## 2018-04-22 NOTE — Telephone Encounter (Signed)
Donald Townsend- please check with hospice regarding his pain control.  And we can always adjust his pain medication based upon hospice recommendations.

## 2018-04-22 NOTE — Telephone Encounter (Signed)
There is question of how patient is to take this, we have 1-2 tabs every 4 hours, hospice has every 3 hours and patient says every 2 hours  All as needed. Please advise

## 2018-04-24 ENCOUNTER — Other Ambulatory Visit: Payer: Self-pay | Admitting: *Deleted

## 2018-04-24 MED ORDER — OXYCODONE HCL 5 MG PO TABS: 5.0000 mg | ORAL_TABLET | ORAL | 0 refills | Status: DC | PRN

## 2018-04-24 MED ORDER — MORPHINE SULFATE ER 15 MG PO TBCR: 15.0000 mg | EXTENDED_RELEASE_TABLET | Freq: Three times a day (TID) | ORAL | 0 refills | Status: DC

## 2018-04-24 NOTE — Telephone Encounter (Signed)
These 2 prescription printed and did not Escribe  Earlier this week.

## 2018-04-25 ENCOUNTER — Encounter: Payer: Self-pay | Admitting: Radiology

## 2018-05-01 ENCOUNTER — Other Ambulatory Visit: Payer: Self-pay | Admitting: *Deleted

## 2018-05-01 MED ORDER — OXYCODONE HCL 5 MG PO TABS: 5.0000 mg | ORAL_TABLET | ORAL | 0 refills | Status: DC | PRN

## 2018-05-01 NOTE — Telephone Encounter (Signed)
Per Hospice nurse, patient is setting an alarm on his phone to take 1 Oxycodone every 2 hours, she was going try to increase his long acting medicine, but as he insists on setting his alarm for as needed medicine, she does not want to change his medications at this time

## 2018-05-02 ENCOUNTER — Ambulatory Visit
Admission: RE | Admit: 2018-05-02 | Discharge: 2018-05-02 | Disposition: A | Discharge status: Home / Self Care | Source: Ambulatory Visit | Attending: Urology | Admitting: Urology

## 2018-05-02 ENCOUNTER — Other Ambulatory Visit: Payer: Self-pay

## 2018-05-02 DIAGNOSIS — N135 Crossing vessel and stricture of ureter without hydronephrosis: Secondary | ICD-10-CM

## 2018-05-02 DIAGNOSIS — N131 Hydronephrosis with ureteral stricture, not elsewhere classified: Secondary | ICD-10-CM | POA: Insufficient documentation

## 2018-05-02 DIAGNOSIS — Z436 Encounter for attention to other artificial openings of urinary tract: Secondary | ICD-10-CM | POA: Insufficient documentation

## 2018-05-02 HISTORY — PX: IR NEPHROSTOMY EXCHANGE LEFT: IMG6069

## 2018-05-02 HISTORY — PX: IR NEPHROSTOMY EXCHANGE RIGHT: IMG6070

## 2018-05-02 LAB — GLUCOSE, CAPILLARY: GLUCOSE-CAPILLARY: 134 mg/dL — AB (ref 70–99)

## 2018-05-02 MED ORDER — IOPAMIDOL (ISOVUE-300) INJECTION 61%
100.0000 mL | Freq: Once | INTRAVENOUS | Status: AC | PRN
  Administered 2018-05-02: 10 mL

## 2018-05-02 NOTE — Procedures (Signed)
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     Melondy Blanchard R. Anselm Pancoast, MD  Pager: [phone]

## 2018-05-06 ENCOUNTER — Other Ambulatory Visit: Payer: Self-pay | Admitting: *Deleted

## 2018-05-06 MED ORDER — MORPHINE SULFATE ER 15 MG PO TBCR: 15.0000 mg | EXTENDED_RELEASE_TABLET | Freq: Three times a day (TID) | ORAL | 0 refills | Status: AC

## 2018-05-06 MED ORDER — MORPHINE SULFATE ER 30 MG PO TBCR: 30.0000 mg | EXTENDED_RELEASE_TABLET | Freq: Three times a day (TID) | ORAL | 0 refills | Status: AC

## 2018-05-06 NOTE — Telephone Encounter (Signed)
Donald Townsend called and asked for refill of MSER 45 mg and reports that patient has only 25 tabs of Oxycodone left and will need a refill in a couple of days. Please advise if there needs to be any changes to his medications. Please advise

## 2018-05-09 ENCOUNTER — Other Ambulatory Visit: Payer: Self-pay | Admitting: *Deleted

## 2018-05-09 MED ORDER — OXYCODONE HCL 5 MG PO TABS: 5.0000 mg | ORAL_TABLET | ORAL | 0 refills | Status: AC | PRN

## 2018-05-16 ENCOUNTER — Other Ambulatory Visit: Payer: Self-pay | Admitting: *Deleted

## 2018-05-16 NOTE — Telephone Encounter (Signed)
Patient does not need this at this time.

## 2018-05-19 ENCOUNTER — Other Ambulatory Visit: Payer: Self-pay | Admitting: *Deleted

## 2018-05-19 MED ORDER — MORPHINE SULFATE (CONCENTRATE) 20 MG/ML PO SOLN: 10.0000 mg | ORAL | 0 refills | Status: DC | PRN

## 2018-05-19 MED ORDER — MORPHINE SULFATE (CONCENTRATE) 20 MG/ML PO SOLN: 10.0000 mg | ORAL | 0 refills | Status: AC | PRN

## 2018-05-19 NOTE — Addendum Note (Signed)
Addended by: Betti Cruz on: 05/19/2018 10:59 AM   Modules accepted: Orders

## 2018-05-19 NOTE — Telephone Encounter (Signed)
Patient now unable to swallow tablets so Hospice is using Roxanol form the comfort kit to control his pain and they need a refill.

## 2018-05-21 ENCOUNTER — Telehealth: Payer: Self-pay | Admitting: *Deleted

## 2018-05-21 ENCOUNTER — Other Ambulatory Visit: Payer: Self-pay | Admitting: Hospice and Palliative Medicine

## 2018-05-21 MED ORDER — SODIUM CHLORIDE 0.9 % IV SOLN: 0.5000 mg/h | INTRAVENOUS | 0 refills | Status: AC

## 2018-05-21 NOTE — Telephone Encounter (Signed)
Rx has been ordered by [employer] - order has been faxed to hospice

## 2018-05-21 NOTE — Progress Notes (Signed)
Message received from hospice nurse about uncontrolled pain. I spoke with hospice nurse, Judson Roch, by phone [phone]). Patient has declined over the past week. He is no longer consistently alert or able to take the MS Contin. Nurse feels patient is near end of life. However, he has had frequent moaning, agitation, and distress. Patient is currently receiving morphine elixir 20mg  hourly, lorazepam 1mg  Q4H, and haldol 2mg  Q4H. Family are unable to sleep at night as they are having to wake to give hourly morphine. Nurse is requesting a continuous infusion. We discussed transfer to residential hospice facility but family want to keep him at home for end of life care.  I suspect that patient could have worsening renal failure given his poor oral intake. It is possible that symptoms reported could be neurotoxicity of the morphine secondary to renal failure. However, I agree that a continuous IV infusion would be a reasonable way to ensure that patient is comfortable at end of life and lessen the caregiver strain on the family. Will rotate to hydromorphone.   In past 24 hours, patient has received a total of 480mg  of po morphine. Hydromorphone IV equivalency would equal 24mg /24 hours. Will decrease starting dose by 50% for incomplete cross tolerance.   Case and plan discussed with Dr. Rogue Bussing. I also spoke with the hospice pharmacist.   Plan: Hydromorphone 0.5mg /hour via continuous infusion through port.

## 2018-05-21 NOTE — Telephone Encounter (Signed)
Pain is not being controlled by MS ER 45 mg three times a day and Roxanol 10 mg hourly. Patient requesting a pump for pain medicine. Please advise and if in agreement, fax orders to hospice

## 2018-05-31 DEATH — deceased

## 2018-06-02 ENCOUNTER — Other Ambulatory Visit: Payer: Self-pay | Admitting: Radiology

## 2018-06-02 DIAGNOSIS — N1330 Unspecified hydronephrosis: Secondary | ICD-10-CM

## 2018-06-02 DIAGNOSIS — N135 Crossing vessel and stricture of ureter without hydronephrosis: Secondary | ICD-10-CM

## 2018-06-20 ENCOUNTER — Ambulatory Visit

## 2018-07-01 ENCOUNTER — Ambulatory Visit: Payer: Medicare Other | Admitting: Urology

## 2018-08-27 ENCOUNTER — Telehealth: Payer: Self-pay | Admitting: *Deleted

## 2018-08-27 NOTE — Telephone Encounter (Addendum)
Daughter Durwin Nora called requesting that Billey Chang, NP return her call, she has a form she needs filled out. Her return number is [phone]

## 2018-08-27 NOTE — Telephone Encounter (Signed)
Spoke with daughter by phone. She is emailing a form for [place] to review.

## 2019-11-20 IMAGING — XA IR EXCHANGE NEPHROSTOMY RIGHT
5 series · 5 of 5 positions shown · non-contrast
Comparison: None.

INDICATION: History of advanced colon cancer with invasion of bladder, post
placement of bilateral percutaneous nephrostomy catheters by Dr.
Parimal on 12/30/2016.

EXAM:
FLUOROSCOPIC GUIDED BILATERAL SIDED NEPHROSTOMY CATHETER EXCHANGE
TECHNIQUE: Informed written consent was obtained from the patient after a
discussion of the risks, benefits and alternatives to treatment.
Questions regarding the procedure were encouraged and answered. A
timeout was performed prior to the initiation of the procedure.

[Series 1: fl - angio · 1 of 1 slices shown (1 of 5)]
[im 1/1]
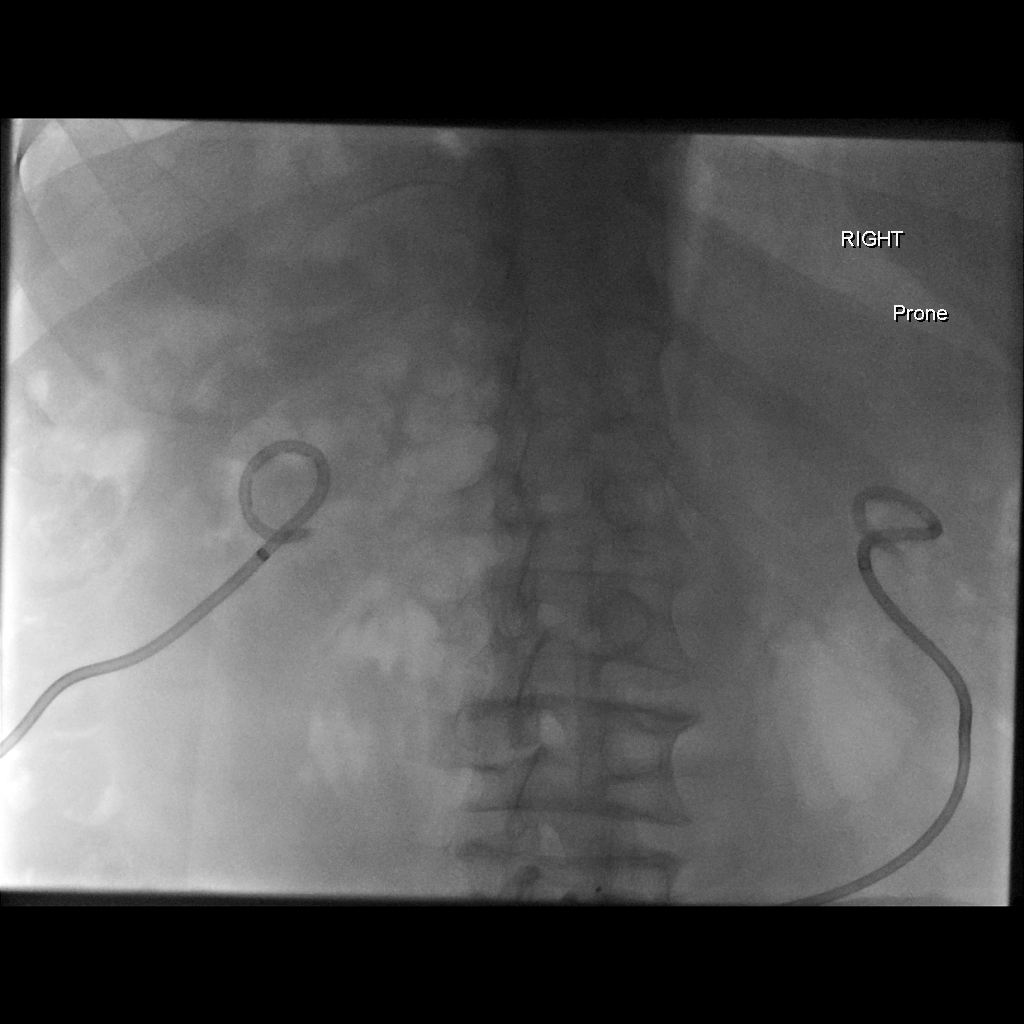

[Series 2: fl - angio · 1 of 1 slices shown (2 of 5)]
[im 1/1]
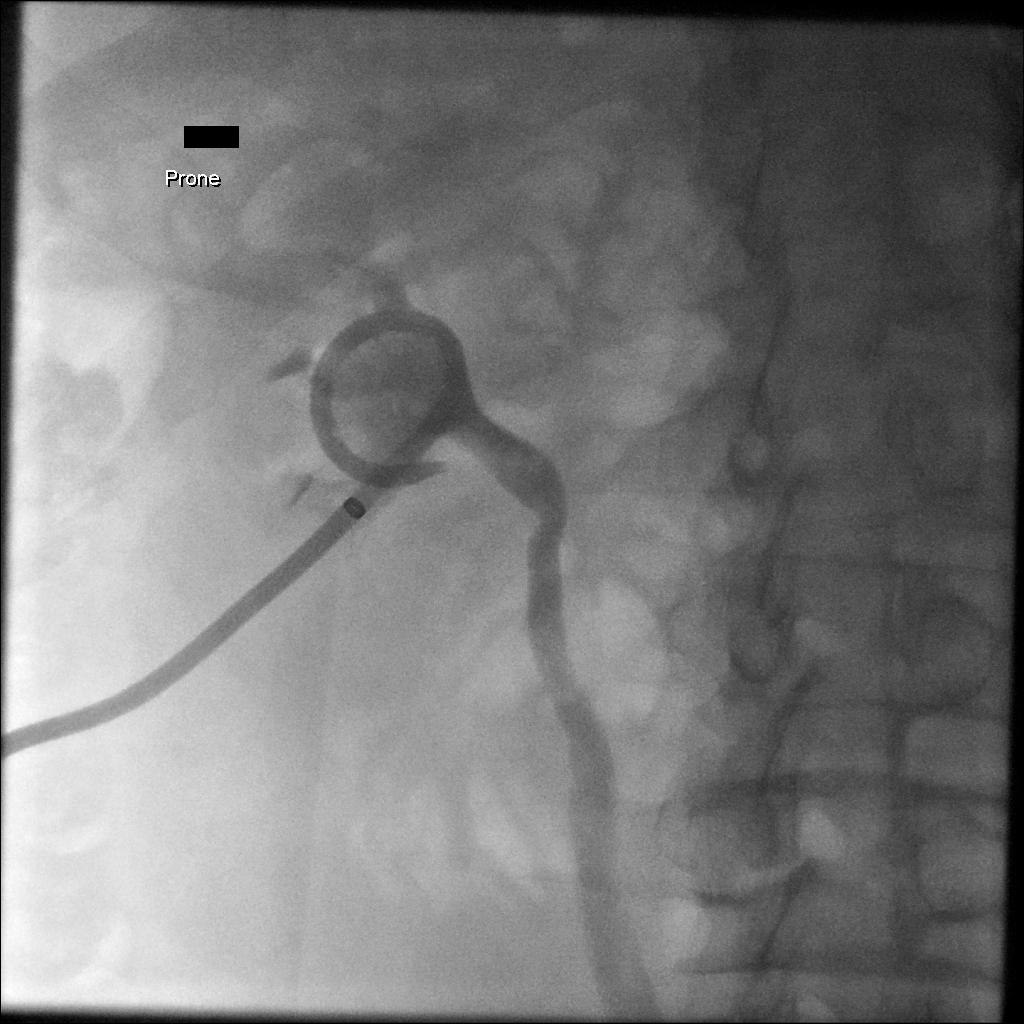

[Series 3: fl - angio · 1 of 1 slices shown (3 of 5)]
[im 1/1]
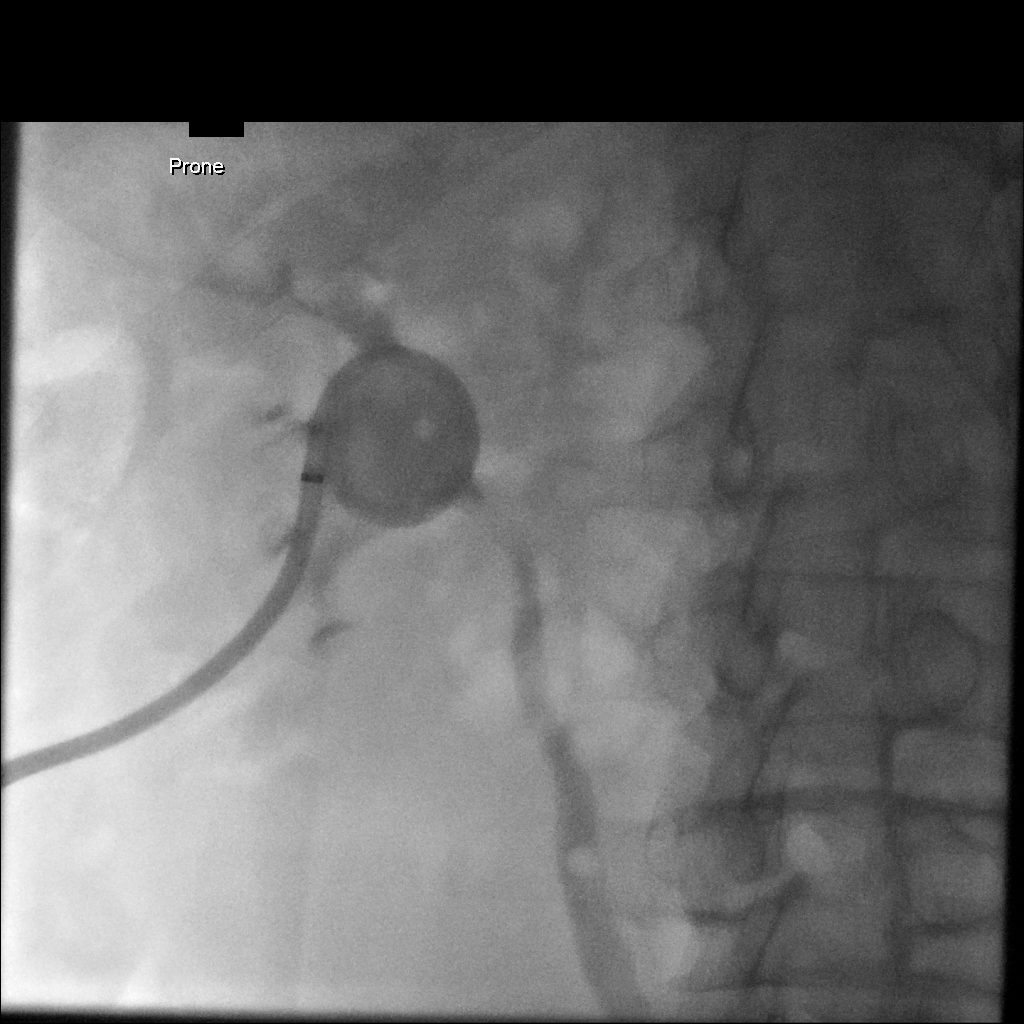

[Series 4: fl - angio · 1 of 1 slices shown (4 of 5)]
[im 1/1]
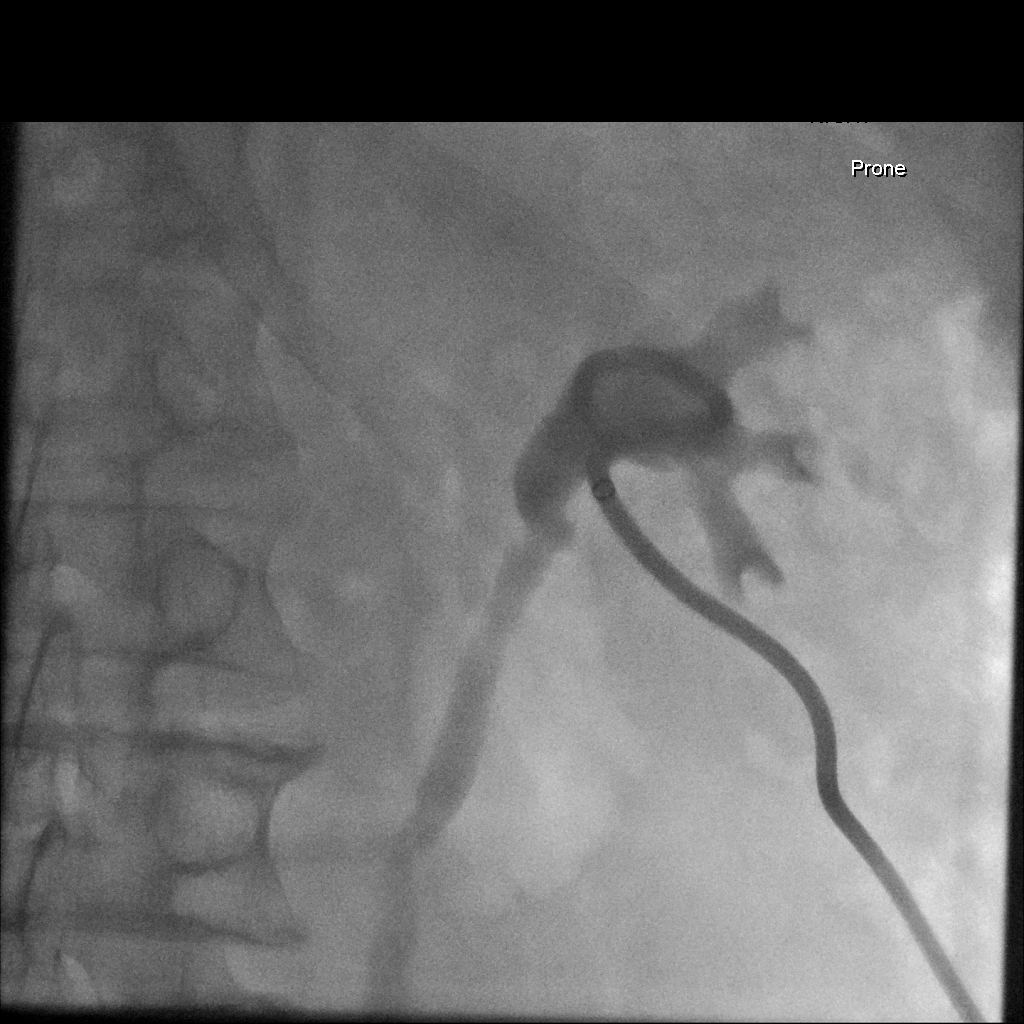

[Series 5: fl - angio · 1 of 1 slices shown (5 of 5)]
[im 1/1]
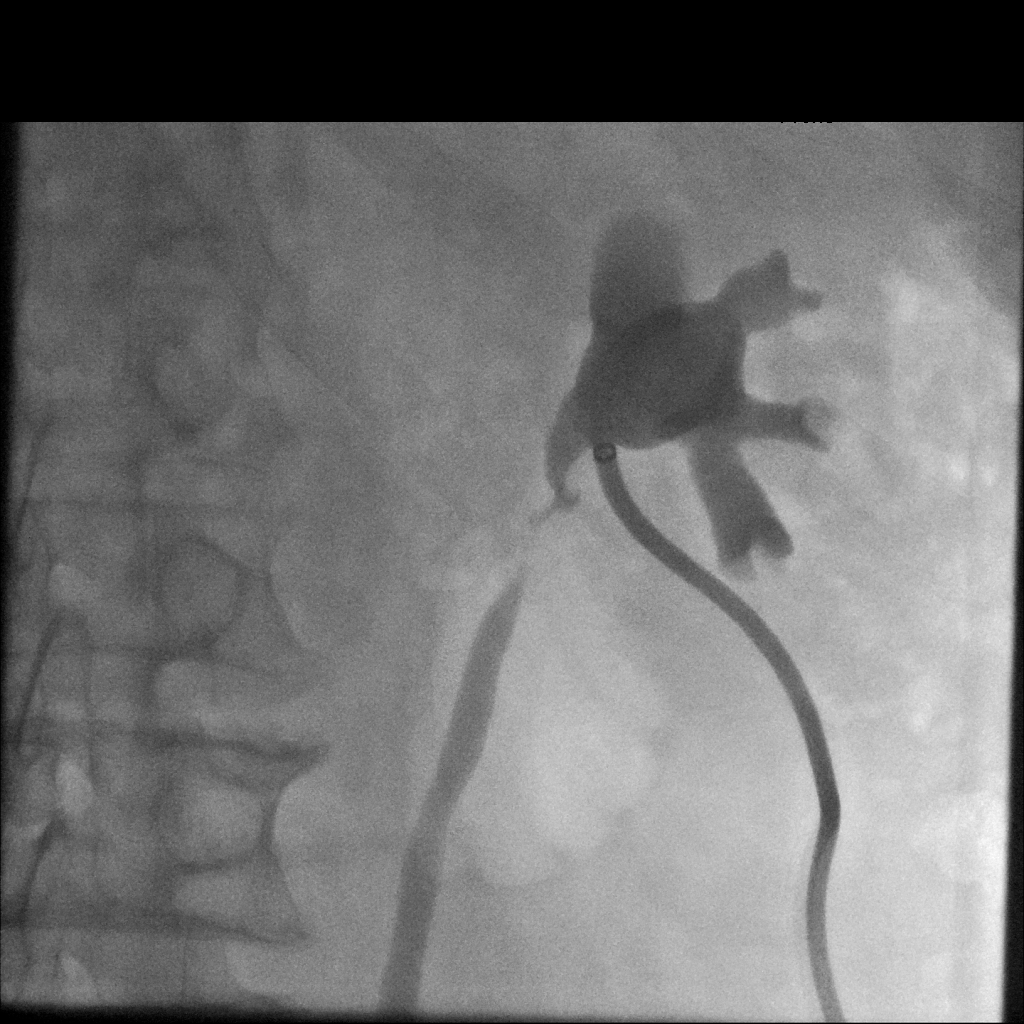

[5 of 5 positions shown; findings below may reference images not displayed]

CONTRAST:  A total of 10 mL Fsovue-LVV administered was administered
into both collecting systems

FLUOROSCOPY TIME:  1 minute 6 seconds (42 mGy)

COMPLICATIONS:
None immediate.
The bilateral flanks and external portions of existing nephrostomy
catheters were prepped and draped in the usual sterile fashion. A
sterile drape was applied covering the operative field. Maximum
barrier sterile technique with sterile gowns and gloves were used
for the procedure. A timeout was performed prior to the initiation
of the procedure.

A pre procedural spot fluoroscopic image was obtained. Beginning
with the left-sided nephrostomy, a small amount of contrast was
injected via the existing left-sided nephrostomy catheter
demonstrating appropriate positioning within the renal pelvis. The
existing nephrostomy catheter was cut and cannulated with a Benson
wire which was coiled within the renal pelvis. Under intermittent
fluoroscopic guidance, the existing nephrostomy catheter was
exchanged for a new 10.2 French all-purpose drainage catheter.
Limited contrast injection confirmed appropriate positioning within
the left renal pelvis and a post exchange fluoroscopic image was
obtained. The catheter was locked, secured to the skin with an
interrupted suture and reconnected to a gravity bag.

The identical repeat procedure was repeated for the contralateral
right-sided nephrostomy, ultimately allowing successful exchange of
a new 10.2 French all-purpose drainage catheter with end coiled and
locked within the right renal pelvis.

Dressings were placed. The patient tolerated the above procedures
well without immediate postprocedural complication.
FINDINGS: The existing nephrostomy catheters are appropriately positioned and
functioning.

After successful fluoroscopic guided exchange, new bilateral
I3.7Erench nephrostomy catheters are coiled and locked within the
respective renal pelvises.
IMPRESSION: Successful fluoroscopic guided exchange of bilateral 10.2 French
percutaneous nephrostomy catheters.
